# Patient Record
Sex: Female | Born: 1975 | Race: White | Hispanic: No | Smoking: Current every day smoker
Health system: Southern US, Community
[De-identification: ages and names within clinical notes are randomized; demographics above are authoritative.]

## PROBLEM LIST (undated history)

## (undated) DIAGNOSIS — F419 Anxiety disorder, unspecified: Secondary | ICD-10-CM

## (undated) DIAGNOSIS — F32A Depression, unspecified: Secondary | ICD-10-CM

## (undated) DIAGNOSIS — B029 Zoster without complications: Secondary | ICD-10-CM

## (undated) DIAGNOSIS — D649 Anemia, unspecified: Secondary | ICD-10-CM

## (undated) DIAGNOSIS — F329 Major depressive disorder, single episode, unspecified: Secondary | ICD-10-CM

## (undated) DIAGNOSIS — R519 Headache, unspecified: Secondary | ICD-10-CM

## (undated) DIAGNOSIS — K219 Gastro-esophageal reflux disease without esophagitis: Secondary | ICD-10-CM

## (undated) HISTORY — PX: BREAST LUMPECTOMY: SHX2

---
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
## 2007-04-27 ENCOUNTER — Encounter: Payer: Self-pay | Admitting: Obstetrics & Gynecology

## 2007-04-27 ENCOUNTER — Other Ambulatory Visit: Admission: RE | Admit: 2007-04-27 | Discharge: 2007-04-27 | Payer: Self-pay | Admitting: Obstetrics & Gynecology

## 2007-04-27 ENCOUNTER — Ambulatory Visit: Payer: Self-pay | Admitting: Obstetrics & Gynecology

## 2007-05-03 ENCOUNTER — Ambulatory Visit (HOSPITAL_COMMUNITY): Admission: RE | Admit: 2007-05-03 | Discharge: 2007-05-03 | Payer: Self-pay | Admitting: Obstetrics & Gynecology

## 2007-05-11 ENCOUNTER — Ambulatory Visit: Payer: Self-pay | Admitting: Obstetrics & Gynecology

## 2007-05-23 ENCOUNTER — Ambulatory Visit: Payer: Self-pay | Admitting: Oncology

## 2007-08-17 ENCOUNTER — Ambulatory Visit (HOSPITAL_COMMUNITY): Admission: RE | Admit: 2007-08-17 | Discharge: 2007-08-17 | Payer: Self-pay | Admitting: Family Medicine

## 2007-08-25 ENCOUNTER — Ambulatory Visit: Payer: Self-pay | Admitting: Obstetrics & Gynecology

## 2007-08-29 ENCOUNTER — Ambulatory Visit: Payer: Self-pay | Admitting: Oncology

## 2007-08-29 LAB — CBC WITH DIFFERENTIAL/PLATELET
BASO%: 0.6 % (ref 0.0–2.0)
Basophils Absolute: 0 10*3/uL (ref 0.0–0.1)
EOS%: 2.9 % (ref 0.0–7.0)
Eosinophils Absolute: 0.2 10*3/uL (ref 0.0–0.5)
HGB: 12.5 g/dL (ref 11.6–15.9)
MCH: 28.6 pg (ref 26.0–34.0)
MCHC: 33.8 g/dL (ref 32.0–36.0)
MCV: 84.5 fL (ref 81.0–101.0)
MONO#: 0.3 10*3/uL (ref 0.1–0.9)
MONO%: 5.5 % (ref 0.0–13.0)
NEUT#: 2.5 10*3/uL (ref 1.5–6.5)
NEUT%: 47.6 % (ref 39.6–76.8)

## 2007-09-02 LAB — PROTHROMBIN TIME: Prothrombin Time: 14.5 seconds (ref 11.6–15.2)

## 2007-09-02 LAB — APTT: aPTT: 35 seconds (ref 24–37)

## 2007-09-02 LAB — VON WILLEBRAND PANEL: Ristocetin-Cofactor: 78 % (ref 50–150)

## 2007-11-04 ENCOUNTER — Ambulatory Visit: Payer: Self-pay | Admitting: Oncology

## 2010-12-01 ENCOUNTER — Encounter: Payer: Self-pay | Admitting: *Deleted

## 2011-03-24 NOTE — Group Therapy Note (Signed)
NAME:  Donna Munoz, Donna Munoz NO.:  [id number]   MEDICAL RECORD NO.:  [id number]          PATIENT TYPE:  WOC   LOCATION:  [facility]                   FACILITY:  [facility]   PHYSICIAN:  Johnella Moloney, MD        DATE OF BIRTH:  1976-03-28   DATE OF SERVICE:  08/25/2007                                  CLINIC NOTE   Patient is a 35 year old female who presents for results.  She has a  history of chronic pelvic pain and menometrorrhagia.  She had a pelvic  ultrasound which showed hemorrhagic cysts on the left ovary and had a  followup ultrasound on August 17, 2007, which now showed involuting  hemorrhagic cyst.  However, given description of her pain last time, she  was referred to Dr. N__________ at [facility] to rule out  interstitial cystitis.  Patient did see Dr. N__________ and is scheduled  to have a potassium test on November 4 to rule out interstitial  cystitis.  Of note, patient complains of continued menometrorrhagia.  Of  note, she had an endometrial biopsy that was negative but had a bleeding  time slightly elevated at 11.  She will meet with hematology on October  20 for further evaluation of this elevated bleeding time.  Given her  menometrorrhagia, discussed different ways to regulate her periods  including OCPs and IUD, Depo-Provera, or surgical management.  Patient  is a smoker and she smokes about 1 to 2 packs of cigarettes per day so  is not a good candidate for estrogen therapy.  Patient did consent to  using progesterone to see if that could help with her.  In regulating  her periods, she was told that she would need to take this medication at  the same time every day and was given a prescription for Camila 1 tab  p.o. daily.  We will follow up in 1 month to see how her bleeding is  going and in the meantime patient was encouraged to follow through  hematology and urology appointments and will follow up the  recommendations from these doctors.       ______________________________  Johnella Moloney, MD     UD/MEDQ  D:  08/25/2007  T:  08/26/2007  Job:  [id number]

## 2011-03-24 NOTE — Group Therapy Note (Signed)
NAME:  Donna Munoz, Donna Munoz NO.:  [id number]   MEDICAL RECORD NO.:  [id number]          PATIENT TYPE:  WOC   LOCATION:  [facility]                   FACILITY:  [facility]   PHYSICIAN:  Elsie Lincoln, MD      DATE OF BIRTH:  June 03, 1976   DATE OF SERVICE:                                  CLINIC NOTE   The patient is a G2 para 1011 who was sent to me from [facility]  health department for pelvic and multiple ovarian cysts. The patient  also has had periods every two weeks since the birth of her daughter  sixteen months. She was on birth control pills that she felt like made  it worse, so she stopped those. She also had a pelvic ultrasound which  showed a uterus that is 6 1/2 x 3 x 5 with a endometrial stripe of 4.47.  There are tiny cystic areas seen in the endometrium which could be  adenomyosis or blood. The left ovary and the right ovary had multiple  follicular cysts which is normal, however I that the practitioner saw  this patient before me and was confused by this and told her that this  was pathologic. The patient is having this pain that she has severe  dyspareunia. Today, she had pain with a bowel movement, sometimes it  hurts when she urinates, but it is distinctly greater worse on the left  than on the right. She was told in the past that she has had PID, but  she has never taken antibiotics or have been hospitalized for this. She  does not ever admit to having gonorrhea or chlamydia. She did have a C-  section and her doctor told her that her pelvis was beautiful, so I do  not know if scar tissue was likely at that time, however she could have  scar tissue after the C-section. She is also anemic with her last  hemoglobin in the 10s. She was never worked up for this, which we will  do today. Also, she bled heavily after a miscarriage, and she may  actually have von Willebrand's disease, and we will do a bleeding time  for that.   PAST MEDICAL HISTORY:   GERD, chronic pelvic pain.   PAST SURGICAL HISTORY:  C-section and a right breast biopsy in 1995.   OBSTETRICAL HISTORY:  1 C-section, 1 miscarriage, no D and C done.   CONTRACEPTION:  Partner has a vasectomy.   GYN HISTORY:  As above, plus no history of abnormal pap smear, fibroid  tumors, sexually transmitted disease.   SOCIAL HISTORY:  Lives with her husband and daughter. She is a smoker.   SYSTEMIC REVIEW:  Positive for numbness and weakness in the fingers,  swollen in the legs, muscle aches, fevers, night sweats, fatigue, weight  loss, frequent headaches, chest pain, nausea, vomiting, and pain with  intercourse.  We are going to address her GYN complaints today.   FAMILY HISTORY:  Diabetes in grandfather, heart disease in mother and  grandfather, heart attack in mother, cancer in mother, grandfather and  grandmother, blood clot in her  mother. We will elucidate this at her  next visit, not getting into it today.   MEDICATIONS:  Aleve and Tylenol sinus.   ALLERGIES:  Denies.   PHYSICAL EXAMINATION:  VITAL SIGNS:  Temperature 97.5, pulse 103, blood  pressure 105/75, weight 108.9, height 5 foot 2 inches.  GENERAL:  Well developed, well nourished no apparent distress.  ABDOMEN:  Soft and nontender, nondistended, no rebound or guarding.  Endometrial biopsy was done after a negative UCG.  GENITALIA:  Tanner 5. Vagina pink, normal rugae, cervix slightly open,  multiparous, nontender. Uterus anteflexed, mobile, but tender. Bladder  and urethra are nontender. Left adnexa is much more tender than the  right, however the right feels bigger. The left ovary cannot really be  felt as well as the right ovary which is surprising that she says that  this is where her pain is. There could be scar tissue.   PROCEDURE:  After formal consent was obtained, the patient's UCG was  negative. The patient was placed into the dorsal lithotomy position and  a speculum was placed into the vagina. The  cervix was cleaned and the  tenaculum was placed. The uterus sounded to approximately 9 cm.  Two  passes of the endometrial curette was performed with good tissue.   ASSESSMENT AND PLAN:  This is a 35 year old female with chronic pelvic  pain and dysfunctional uterine bleeding.  1. Endometrial biopsy as above.  2. GC and chlamydia wet prep.  3. Pelvic ultrasound.  4. The patient has anemia and we are going to get bleeding time,      pelvic ultrasound, ferritin, B12, RBC, folate, and hemoglobin      electrophoresis.  5. Come back in two weeks for results and plan.           ______________________________  Elsie Lincoln, MD     KL/MEDQ  D:  04/27/2007  T:  04/28/2007  Job:  [id number]

## 2011-08-26 LAB — POCT PREGNANCY, URINE: Operator id: 148111

## 2017-11-09 HISTORY — PX: DENTAL SURGERY: SHX609

## 2017-12-20 ENCOUNTER — Encounter (HOSPITAL_COMMUNITY): Payer: Self-pay | Admitting: Emergency Medicine

## 2017-12-20 ENCOUNTER — Other Ambulatory Visit: Payer: Self-pay

## 2017-12-20 ENCOUNTER — Emergency Department (HOSPITAL_COMMUNITY)
Admission: EM | Admit: 2017-12-20 | Discharge: 2017-12-20 | Disposition: A | Discharge status: Home / Self Care | Payer: Medicaid Other | Attending: Emergency Medicine | Admitting: Emergency Medicine

## 2017-12-20 DIAGNOSIS — R05 Cough: Secondary | ICD-10-CM | POA: Diagnosis present

## 2017-12-20 DIAGNOSIS — R69 Illness, unspecified: Secondary | ICD-10-CM

## 2017-12-20 DIAGNOSIS — F17200 Nicotine dependence, unspecified, uncomplicated: Secondary | ICD-10-CM | POA: Diagnosis not present

## 2017-12-20 DIAGNOSIS — J111 Influenza due to unidentified influenza virus with other respiratory manifestations: Secondary | ICD-10-CM | POA: Insufficient documentation

## 2017-12-20 MED ORDER — IBUPROFEN 600 MG PO TABS: 600.0000 mg | ORAL_TABLET | Freq: Four times a day (QID) | ORAL | 0 refills | Status: DC | PRN

## 2017-12-20 MED ORDER — OSELTAMIVIR PHOSPHATE 75 MG PO CAPS: 75.0000 mg | ORAL_CAPSULE | Freq: Two times a day (BID) | ORAL | 0 refills | Status: DC

## 2017-12-20 NOTE — Discharge Instructions (Signed)
Drink plenty of fluids.  Tylenol every 4 hours for fever.  Follow-up with your primary doctor or return here for any worsening symptoms.

## 2017-12-20 NOTE — ED Triage Notes (Signed)
Pt reports cough, headache, nausea, body aches since yesterday. Pt reports significant other diagnosed and admitted with influenza A. Pt reports last dose of tylenol 12/18/17.

## 2017-12-20 NOTE — ED Provider Notes (Signed)
[facility] EMERGENCY DEPARTMENT Provider Note   CSN: [id number] Arrival date & time: 12/20/17  7672     History   Chief Complaint Chief Complaint  Patient presents with  . Cough    HPI Donna Munoz is a 42 y.o. female.  HPI  Donna Munoz is a 42 y.o. female who presents to the Emergency Department complaining of cough, frontal headache, generalized body aches and fever.  Symptoms present for one day.  Max fever unknown.  Taking tylenol.  Reports that significant other admitted to the hospital one day prior for influenza.  Body aches worse today.  States cough has been non-productive.  She denies chest pain, shortness of breath, abdominal pain, vomiting, neck pain and stiffness.  Did not receive flu vaccine this season  History reviewed. No pertinent past medical history.  There are no active problems to display for this patient.   History reviewed. No pertinent surgical history.  OB History    No data available       Home Medications    Prior to Admission medications   Not on File    Family History History reviewed. No pertinent family history.  Social History Social History   Tobacco Use  . Smoking status: Current Every Day Smoker    Packs/day: 1.00  . Smokeless tobacco: Never Used  Substance Use Topics  . Alcohol use: No    Frequency: Never  . Drug use: Not on file     Allergies   Patient has no allergy information on record.   Review of Systems Review of Systems  Constitutional: Positive for chills and fever. Negative for activity change and appetite change.  HENT: Positive for congestion and sore throat. Negative for facial swelling, rhinorrhea and trouble swallowing.   Eyes: Negative for visual disturbance.  Respiratory: Positive for cough. Negative for chest tightness, shortness of breath, wheezing and stridor.   Gastrointestinal: Negative for abdominal pain, nausea and vomiting.  Genitourinary: Negative for dysuria and flank pain.    Musculoskeletal: Positive for myalgias. Negative for neck pain and neck stiffness.  Skin: Negative for rash.  Neurological: Positive for headaches. Negative for dizziness, weakness and numbness.  Hematological: Negative for adenopathy.  Psychiatric/Behavioral: Negative for confusion.  All other systems reviewed and are negative.    Physical Exam Updated Vital Signs BP 110/70 (BP Location: Left Arm)   Pulse 92   Temp 98.2 F (36.8 C) (Oral)   Resp 18   Ht 5\' 2"  (1.575 m)   Wt 50.8 kg (112 lb)   LMP 12/20/2017   SpO2 98%   BMI 20.49 kg/m   Physical Exam  Constitutional: She is oriented to person, place, and time. She appears well-developed and well-nourished. No distress.  HENT:  Head: Normocephalic and atraumatic.  Right Ear: Tympanic membrane and ear canal normal.  Left Ear: Tympanic membrane and ear canal normal.  Nose: Mucosal edema and rhinorrhea present.  Mouth/Throat: Uvula is midline and mucous membranes are normal. No trismus in the jaw. No uvula swelling. Posterior oropharyngeal erythema present. No oropharyngeal exudate, posterior oropharyngeal edema or tonsillar abscesses.  Eyes: Conjunctivae are normal.  Neck: Normal range of motion and phonation normal. Neck supple. No Kernig's sign noted.  Cardiovascular: Normal rate, regular rhythm and intact distal pulses.  No murmur heard. Pulmonary/Chest: Effort normal and breath sounds normal. No respiratory distress. She has no wheezes. She has no rales.  Abdominal: Soft. She exhibits no distension. There is no tenderness. There is no rebound and no guarding.  Musculoskeletal: She exhibits no edema.  Lymphadenopathy:    She has no cervical adenopathy.  Neurological: She is alert and oriented to person, place, and time. She exhibits normal muscle tone. Coordination normal.  Skin: Skin is warm and dry.  Psychiatric: She has a normal mood and affect.  Nursing note and vitals reviewed.    ED Treatments / Results   Labs (all labs ordered are listed, but only abnormal results are displayed) Labs Reviewed - No data to display  EKG  EKG Interpretation None       Radiology No results found.  Procedures Procedures (including critical care time)  Medications Ordered in ED Medications - No data to display   Initial Impression / Assessment and Plan / ED Course  I have reviewed the triage vital signs and the nursing notes.  Pertinent labs & imaging results that were available during my care of the patient were reviewed by me and considered in my medical decision making (see chart for details).     Pt's spouse admitted yesterday with influenza.  Pt requesting Tamiflu.  Non-toxic appearing on exam.  vitals reassuring. Will continue fluids, tylenol and ibuprofen.     Final Clinical Impressions(s) / ED Diagnoses   Final diagnoses:  Influenza-like illness    ED Discharge Orders    None       Kem Parkinson, PA-C 12/20/17 2130    Francine Graven, DO 12/22/17 [phone]

## 2019-11-22 DIAGNOSIS — M24574 Contracture, right foot: Secondary | ICD-10-CM | POA: Diagnosis not present

## 2019-11-22 DIAGNOSIS — D492 Neoplasm of unspecified behavior of bone, soft tissue, and skin: Secondary | ICD-10-CM | POA: Diagnosis not present

## 2019-11-22 DIAGNOSIS — C4371 Malignant melanoma of right lower limb, including hip: Secondary | ICD-10-CM | POA: Diagnosis not present

## 2019-12-08 ENCOUNTER — Other Ambulatory Visit: Payer: Self-pay

## 2019-12-08 ENCOUNTER — Encounter (HOSPITAL_COMMUNITY): Payer: Self-pay

## 2019-12-11 ENCOUNTER — Inpatient Hospital Stay (HOSPITAL_COMMUNITY): Payer: Medicaid Other | Attending: Hematology | Admitting: Hematology

## 2019-12-11 ENCOUNTER — Encounter (HOSPITAL_COMMUNITY): Payer: Self-pay | Admitting: Hematology

## 2019-12-11 ENCOUNTER — Other Ambulatory Visit: Payer: Self-pay

## 2019-12-11 ENCOUNTER — Inpatient Hospital Stay (HOSPITAL_COMMUNITY): Payer: Medicaid Other

## 2019-12-11 VITALS — BP 93/73 | HR 92 | Temp 97.3°F | Resp 18 | Ht 62.0 in | Wt 110.0 lb

## 2019-12-11 DIAGNOSIS — L600 Ingrowing nail: Secondary | ICD-10-CM | POA: Insufficient documentation

## 2019-12-11 DIAGNOSIS — C439 Malignant melanoma of skin, unspecified: Secondary | ICD-10-CM

## 2019-12-11 DIAGNOSIS — F17210 Nicotine dependence, cigarettes, uncomplicated: Secondary | ICD-10-CM | POA: Insufficient documentation

## 2019-12-11 DIAGNOSIS — M79651 Pain in right thigh: Secondary | ICD-10-CM | POA: Diagnosis not present

## 2019-12-11 DIAGNOSIS — C4371 Malignant melanoma of right lower limb, including hip: Secondary | ICD-10-CM | POA: Diagnosis not present

## 2019-12-11 LAB — CBC WITH DIFFERENTIAL/PLATELET
Abs Immature Granulocytes: 0.02 10*3/uL (ref 0.00–0.07)
Basophils Absolute: 0 10*3/uL (ref 0.0–0.1)
Basophils Relative: 1 %
Eosinophils Absolute: 0.1 10*3/uL (ref 0.0–0.5)
Eosinophils Relative: 2 %
HCT: 35.2 % — ABNORMAL LOW (ref 36.0–46.0)
Hemoglobin: 10 g/dL — ABNORMAL LOW (ref 12.0–15.0)
Immature Granulocytes: 0 %
Lymphocytes Relative: 22 %
Lymphs Abs: 1.6 10*3/uL (ref 0.7–4.0)
MCH: 21.6 pg — ABNORMAL LOW (ref 26.0–34.0)
MCHC: 28.4 g/dL — ABNORMAL LOW (ref 30.0–36.0)
MCV: 76 fL — ABNORMAL LOW (ref 80.0–100.0)
Monocytes Absolute: 0.5 10*3/uL (ref 0.1–1.0)
Monocytes Relative: 7 %
Neutro Abs: 5.1 10*3/uL (ref 1.7–7.7)
Neutrophils Relative %: 68 %
Platelets: 323 10*3/uL (ref 150–400)
RBC: 4.63 MIL/uL (ref 3.87–5.11)
RDW: 18.7 % — ABNORMAL HIGH (ref 11.5–15.5)
WBC: 7.3 10*3/uL (ref 4.0–10.5)
nRBC: 0 % (ref 0.0–0.2)

## 2019-12-11 LAB — COMPREHENSIVE METABOLIC PANEL
ALT: 16 U/L (ref 0–44)
AST: 23 U/L (ref 15–41)
Albumin: 4.2 g/dL (ref 3.5–5.0)
Alkaline Phosphatase: 69 U/L (ref 38–126)
Anion gap: 4 — ABNORMAL LOW (ref 5–15)
BUN: 9 mg/dL (ref 6–20)
CO2: 27 mmol/L (ref 22–32)
Calcium: 9.7 mg/dL (ref 8.9–10.3)
Chloride: 106 mmol/L (ref 98–111)
Creatinine, Ser: 0.64 mg/dL (ref 0.44–1.00)
GFR calc Af Amer: >60 mL/min (ref >60)
GFR calc non Af Amer: >60 mL/min (ref >60)
Glucose, Bld: 92 mg/dL (ref 70–99)
Potassium: 4.5 mmol/L (ref 3.5–5.1)
Sodium: 137 mmol/L (ref 135–145)
Total Bilirubin: 0.5 mg/dL (ref 0.3–1.2)
Total Protein: 7.7 g/dL (ref 6.5–8.1)

## 2019-12-11 LAB — LACTATE DEHYDROGENASE: LDH: 105 U/L (ref 98–192)

## 2019-12-11 NOTE — Assessment & Plan Note (Signed)
1.  Malignant melanoma, acral type: -Patient reports having a freckle-like lesion since childhood on the medial right third toe. -5 years ago it turned black and grew in size in the last 3 years. -[facility] [place] [facility] ingrowing toenail of the right great toe.  She was found to have skin lesion which was also biopsied. -Pathology 11/22/2019 shows malignant melanoma, acral type, Breslow's depth 2 mm, positive ulceration, mitotic index 3, absent microsatellitosis and LVI, perineural invasion absent, tumor infiltrating lymphocytes nonbrisk, tumor regression absent, peripheral margins involved by tumor, deep margins uninvolved by tumor.  Pathological staging is at least pT2b. -She also noticed 3 small moles on the right leg in the last 1 year. -She reported right groin and right medial thigh pain for the past few months. -We have recommended a whole-body PET CT scan to include the lower extremities to evaluate for metastatic disease.  We will also check CBC, CMP and LDH today. -If there is no metastatic disease, we will make a referral for surgical resection.  2.  Family history: -Mother died of throat cancer.  Maternal grand mother had breast cancer.  Maternal aunt had pancreatic cancer. -Maternal grandfather had "bone cancer".  Maternal great grandmother died of melanoma. -Given the extensive family history, I think she will benefit from genetic testing.

## 2019-12-11 NOTE — Patient Instructions (Signed)
[facility] Discharge Instructions  You were seen today by Dr. Delton Coombes. He went over your history, family history and how you've been feeling lately. He will schedule you for a whole body PET scan for further evaluation. He will have blood work drawn today. He will see you back after your scan for follow up.   Thank you for choosing [facility] to provide your oncology and hematology care.  To afford each patient quality time with our provider, please arrive at least 15 minutes before your scheduled appointment time.   If you have a lab appointment with the [facility] please come in thru the  Main Entrance and check in at the main information desk  You need to re-schedule your appointment should you arrive 10 or more minutes late.  We strive to give you quality time with our providers, and arriving late affects you and other patients whose appointments are after yours.  Also, if you no show three or more times for appointments you may be dismissed from the clinic at the providers discretion.     Again, thank you for choosing [facility].  Our hope is that these requests will decrease the amount of time that you wait before being seen by our physicians.       _____________________________________________________________  Should you have questions after your visit to [facility], please contact our office at [phone] between the hours of 8:00 a.m. and 4:30 p.m.  Voicemails left after 4:00 p.m. will not be returned until the following business day.  For prescription refill requests, have your pharmacy contact our office and allow 72 hours.    Cancer Center Support Programs:   > Cancer Support Group  2nd Tuesday of the month 1pm-2pm, Journey Room

## 2019-12-11 NOTE — Progress Notes (Signed)
[facility] CONSULT NOTE  Patient Care Team: Patient, No Pcp Per as PCP - General (General Practice)  CHIEF COMPLAINTS/PURPOSE OF CONSULTATION:  Malignant melanoma.  HISTORY OF PRESENTING ILLNESS:  Donna Munoz 44 y.o. female is seen in consultation today at the request of [facility] for malignant melanoma involving the skin of right third toe.  Patient had developed ingrowing toenail in the right great toe and was seen at [facility] [place] in [place].  She was noted to have an abnormal skin lesion in the medial part of the right third toe.  This was biopsied and was consistent with malignant melanoma, acral type.  Patient reported having a freckle since she was a child in the same area.  About 5 years ago, the area got dark pigmentation.  She also noticed growth in the size of the mole in the last 3 years.  She also reported right groin and right medial thigh pain for the last 1 year.  Denies any fevers, night sweats or weight loss.  She reports that she has been out of work for the past 1 year.  She was working at [employer] prior to that.  She is a current active smoker, smokes 1-1 and half packs per day for 25 years.  Otherwise denies any major medical problems.  Family history significant for maternal great grandmother who died of melanoma.  Maternal grandfather had "bone cancer".  Mother died of throat cancer metastatic.  Maternal grandmother had breast cancer and maternal aunt had pancreatic cancer.  MEDICAL HISTORY:  History reviewed. No pertinent past medical history.  SURGICAL HISTORY: Past Surgical History:  Procedure Laterality Date  . BREAST LUMPECTOMY  age 3  . CESAREAN SECTION  01/06/2006  . CESAREAN SECTION  10/15/2016  . DENTAL SURGERY  2019    SOCIAL HISTORY: Social History   Socioeconomic History  . Marital status: Legally Separated    Spouse name: Not on file  . Number of children: 2  . Years of education: Not on  file  . Highest education level: Not on file  Occupational History  . Occupation: unemployed d/t COVID  Tobacco Use  . Smoking status: Current Every Day Smoker    Packs/day: 1.00  . Smokeless tobacco: Never Used  Substance and Sexual Activity  . Alcohol use: No  . Drug use: Yes    Types: Marijuana  . Sexual activity: Yes  Other Topics Concern  . Not on file  Social History Narrative  . Not on file   Social Determinants of Health   Financial Resource Strain: High Risk  . Difficulty of Paying Living Expenses: Hard  Food Insecurity: No Food Insecurity  . Worried About Charity fundraiser in the Last Year: Never true  . Ran Out of Food in the Last Year: Never true  Transportation Needs: No Transportation Needs  . Lack of Transportation (Medical): No  . Lack of Transportation (Non-Medical): No  Physical Activity: Inactive  . Days of Exercise per Week: 0 days  . Minutes of Exercise per Session: 0 min  Stress: Stress Concern Present  . Feeling of Stress : Very much  Social Connections: Moderately Isolated  . Frequency of Communication with Friends and Family: Twice a week  . Frequency of Social Gatherings with Friends and Family: Never  . Attends Religious Services: 1 to 4 times per year  . Active Member of Clubs or Organizations: No  . Attends Archivist Meetings: Never  .  Marital Status: Separated  Intimate Partner Violence: Not At Risk  . Fear of Current or Ex-Partner: No  . Emotionally Abused: No  . Physically Abused: No  . Sexually Abused: No    FAMILY HISTORY: Family History  Problem Relation Age of Onset  . Heart disease Mother   . Cancer Mother   . Ovarian cysts Sister   . Healthy Brother   . Cancer Maternal Grandmother   . Heart attack Maternal Grandmother   . Cancer Maternal Grandfather   . Cancer Paternal Grandmother   . Healthy Sister   . Healthy Sister   . Psoriasis Daughter   . Healthy Son     ALLERGIES:  has no allergies on  file.  MEDICATIONS:  Current Outpatient Medications  Medication Sig Dispense Refill  . ibuprofen (ADVIL,MOTRIN) 600 MG tablet Take 1 tablet (600 mg total) by mouth every 6 (six) hours as needed. Take with food (Patient not taking: Reported on 12/08/2019) 21 tablet 0   No current facility-administered medications for this visit.    REVIEW OF SYSTEMS:   Constitutional: Denies fevers, chills or abnormal night sweats Eyes: Denies blurriness of vision, double vision or watery eyes Ears, nose, mouth, throat, and face: Denies mucositis or sore throat Respiratory: Denies cough, dyspnea or wheezes Cardiovascular: Denies palpitation, chest discomfort or lower extremity swelling Gastrointestinal:  Denies nausea, heartburn or change in bowel habits Skin: Denies abnormal skin rashes Lymphatics: Denies new lymphadenopathy or easy bruising Neurological:Denies numbness, tingling or new weaknesses Behavioral/Psych: Mood is stable, no new changes.  Positive for sleep problems. All other systems were reviewed with the patient and are negative.  PHYSICAL EXAMINATION: ECOG PERFORMANCE STATUS: 0 - Asymptomatic  Vitals:   12/11/19 1249  BP: 93/73  Pulse: 92  Resp: 18  Temp: (!) 97.3 F (36.3 C)  SpO2: 100%   Filed Weights   12/11/19 1249  Weight: 110 lb (49.9 kg)    GENERAL:alert, no distress and comfortable SKIN: skin color, texture, turgor are normal, no rashes or significant lesions EYES: normal, conjunctiva are pink and non-injected, sclera clear OROPHARYNX:no exudate, no erythema and lips, buccal mucosa, and tongue normal  NECK: supple, thyroid normal size, non-tender, without nodularity LYMPH:  no palpable lymphadenopathy in the cervical, axillary or inguinal LUNGS: clear to auscultation and percussion with normal breathing effort HEART: regular rate & rhythm and no murmurs and no lower extremity edema ABDOMEN:abdomen soft, non-tender and normal bowel sounds Musculoskeletal:no cyanosis  of digits and no clubbing  PSYCH: alert & oriented x 3 with fluent speech NEURO: no focal motor/sensory deficits There is a scab at the biopsy site in the medial part of the right third toe. There are at least 3 hyperpigmented lesions in the right leg, one of them is raised.         LABORATORY DATA:  I have reviewed the data as listed Lab Results  Component Value Date   WBC 5.2 08/29/2007   HGB 12.5 08/29/2007   HCT 37.0 08/29/2007   MCV 84.5 08/29/2007   PLT 277 08/29/2007     Chemistry   No results found for: NA, K, CL, CO2, BUN, CREATININE, GLU No results found for: CALCIUM, ALKPHOS, AST, ALT, BILITOT     RADIOGRAPHIC STUDIES: I have personally reviewed the radiological images as listed and agreed with the findings in the report.  ASSESSMENT & PLAN:  Melanoma of skin ([place]) 1.  Malignant melanoma, acral type: -Patient reports having a freckle-like lesion since childhood on the medial right  third toe. -5 years ago it turned black and grew in size in the last 3 years. -[facility] went to [facility] [place] for ingrowing toenail of the right great toe.  She was found to have skin lesion which was also biopsied. -Pathology 11/22/2019 shows malignant melanoma, acral type, Breslow's depth 2 mm, positive ulceration, mitotic index 3, absent microsatellitosis and LVI, perineural invasion absent, tumor infiltrating lymphocytes nonbrisk, tumor regression absent, peripheral margins involved by tumor, deep margins uninvolved by tumor.  Pathological staging is at least pT2b. -She also noticed 3 small moles on the right leg in the last 1 year. -She reported right groin and right medial thigh pain for the past few months. -We have recommended a whole-body PET CT scan to include the lower extremities to evaluate for metastatic disease.  We will also check CBC, CMP and LDH today. -If there is no metastatic disease, we will make a referral for surgical resection.  2.  Family  history: -Mother died of throat cancer.  Maternal grand mother had breast cancer.  Maternal aunt had pancreatic cancer. -Maternal grandfather had "bone cancer".  Maternal great grandmother died of melanoma. -Given the extensive family history, I think she will benefit from genetic testing.  Orders Placed This Encounter  Procedures  . NM PET Image Initial (PI) Whole Body    Please be sure to get both legs and both feet    Standing Status:   Future    Standing Expiration Date:   12/10/2020    Order Specific Question:   ** REASON FOR EXAM (FREE TEXT)    Answer:   melanoma of right third toe    Order Specific Question:   If indicated for the ordered procedure, I authorize the administration of a radiopharmaceutical per Radiology protocol    Answer:   Yes    Order Specific Question:   Is the patient pregnant?    Answer:   No    Order Specific Question:   Preferred imaging location?    Answer:   [facility]    Order Specific Question:   Radiology Contrast Protocol - do NOT remove file path    Answer:   [email or web address]  . CBC with Differential/Platelet    Standing Status:   Future    Standing Expiration Date:   12/10/2020  . Comprehensive metabolic panel    Standing Status:   Future    Standing Expiration Date:   12/10/2020  . Lactate dehydrogenase    Standing Status:   Future    Standing Expiration Date:   12/10/2020    All questions were answered. The patient knows to call the clinic with any problems, questions or concerns.      Derek Jack, MD 12/11/2019 1:40 PM

## 2019-12-20 ENCOUNTER — Encounter (HOSPITAL_COMMUNITY)
Admission: RE | Admit: 2019-12-20 | Discharge: 2019-12-20 | Disposition: A | Discharge status: Home / Self Care | Payer: Medicaid Other | Source: Ambulatory Visit | Attending: Hematology | Admitting: Hematology

## 2019-12-20 ENCOUNTER — Other Ambulatory Visit: Payer: Self-pay

## 2019-12-20 DIAGNOSIS — C4371 Malignant melanoma of right lower limb, including hip: Secondary | ICD-10-CM | POA: Diagnosis not present

## 2019-12-20 LAB — GLUCOSE, CAPILLARY: Glucose-Capillary: 85 mg/dL (ref 70–99)

## 2019-12-21 ENCOUNTER — Inpatient Hospital Stay (HOSPITAL_BASED_OUTPATIENT_CLINIC_OR_DEPARTMENT_OTHER): Payer: Medicaid Other | Admitting: Hematology

## 2019-12-21 VITALS — BP 101/69 | HR 80 | Temp 97.1°F | Resp 20 | Wt 115.2 lb

## 2019-12-21 DIAGNOSIS — E041 Nontoxic single thyroid nodule: Secondary | ICD-10-CM | POA: Diagnosis not present

## 2019-12-21 DIAGNOSIS — C4371 Malignant melanoma of right lower limb, including hip: Secondary | ICD-10-CM | POA: Diagnosis not present

## 2019-12-21 DIAGNOSIS — C439 Malignant melanoma of skin, unspecified: Secondary | ICD-10-CM | POA: Diagnosis not present

## 2019-12-21 NOTE — Progress Notes (Signed)
Donna Munoz, Donna Munoz   CLINIC:  Medical Oncology/Hematology  PCP:  Patient, No Pcp Per No address on file None   REASON FOR VISIT:  Follow-up for malignant melanoma.  CURRENT THERAPY: Under work-up.  BRIEF ONCOLOGIC HISTORY:  Oncology History   No history exists.     CANCER STAGING: Cancer Staging Melanoma of skin ([place]) Staging form: Melanoma of the Skin, AJCC 8th Edition - Clinical: Stage IIA (cT2b, cN0, cM0) - Unsigned    INTERVAL HISTORY:  [name] 44 y.o. female seen for follow-up of Donna Munoz.  She just underwent a PET CT scan.  Reported some shortness of breath and cough at nighttime.  Cough is dry.  Reports some anxiety and sleep problems because of the new diagnosis.  Appetite is 25%.  Energy levels are 50%.  No new onset pains.    REVIEW OF SYSTEMS:  Review of Systems  Respiratory: Positive for cough and shortness of breath.   Psychiatric/Behavioral: Positive for sleep disturbance. The patient is nervous/anxious.   All other systems reviewed and are negative.    PAST MEDICAL/SURGICAL HISTORY:  No past medical history on file. Past Surgical History:  Procedure Laterality Date  . BREAST LUMPECTOMY  age 86  . CESAREAN SECTION  01/06/2006  . CESAREAN SECTION  10/15/2016  . DENTAL SURGERY  2019     SOCIAL HISTORY:  Social History   Socioeconomic History  . Marital status: Legally Separated    Spouse name: Not on file  . Number of children: 2  . Years of education: Not on file  . Highest education level: Not on file  Occupational History  . Occupation: unemployed d/t COVID  Tobacco Use  . Smoking status: Current Every Day Smoker    Packs/day: 1.00  . Smokeless tobacco: Never Used  Substance and Sexual Activity  . Alcohol use: No  . Drug use: Yes    Types: Marijuana  . Sexual activity: Yes  Other Topics Concern  . Not on file  Social History Narrative  . Not on file   Social  Determinants of Health   Financial Resource Strain: High Risk  . Difficulty of Paying Living Expenses: Hard  Food Insecurity: No Food Insecurity  . Worried About Charity fundraiser in the Last Year: Never true  . Ran Out of Food in the Last Year: Never true  Transportation Needs: No Transportation Needs  . Lack of Transportation (Medical): No  . Lack of Transportation (Non-Medical): No  Physical Activity: Inactive  . Days of Exercise per Week: 0 days  . Minutes of Exercise per Session: 0 min  Stress: Stress Concern Present  . Feeling of Stress : Very much  Social Connections: Moderately Isolated  . Frequency of Communication with Friends and Family: Twice a week  . Frequency of Social Gatherings with Friends and Family: Never  . Attends Religious Services: 1 to 4 times per year  . Active Member of Clubs or Organizations: No  . Attends Archivist Meetings: Never  . Marital Status: Separated  Intimate Partner Violence: Not At Risk  . Fear of Current or Ex-Partner: No  . Emotionally Abused: No  . Physically Abused: No  . Sexually Abused: No    FAMILY HISTORY:  Family History  Problem Relation Age of Onset  . Heart disease Mother   . Cancer Mother   . Ovarian cysts Sister   . Healthy Brother   . Cancer Maternal Grandmother   .  Heart attack Maternal Grandmother   . Cancer Maternal Grandfather   . Cancer Paternal Grandmother   . Healthy Sister   . Healthy Sister   . Psoriasis Daughter   . Healthy Son     CURRENT MEDICATIONS:  Outpatient Encounter Medications as of 12/21/2019  Medication Sig  . ibuprofen (ADVIL,MOTRIN) 600 MG tablet Take 1 tablet (600 mg total) by mouth every 6 (six) hours as needed. Take with food (Patient not taking: Reported on 12/08/2019)   No facility-administered encounter medications on file as of 12/21/2019.    ALLERGIES:  No Known Allergies   PHYSICAL EXAM:  ECOG Performance status: 0  Vitals:   12/21/19 1111  BP: 101/69   Pulse: 80  Resp: 20  Temp: (!) 97.1 F (36.2 C)  SpO2: 100%   Filed Weights   12/21/19 1111  Weight: 115 lb 3.2 oz (52.3 kg)    Physical Exam Vitals reviewed.  Constitutional:      Appearance: Normal appearance.  Cardiovascular:     Rate and Rhythm: Normal rate and regular rhythm.     Heart sounds: Normal heart sounds.  Pulmonary:     Effort: Pulmonary effort is normal.     Breath sounds: Normal breath sounds.  Skin:    General: Skin is warm.  Neurological:     General: No focal deficit present.     Mental Status: She is alert and oriented to person, place, and time.  Psychiatric:        Mood and Affect: Mood normal.        Behavior: Behavior normal.      LABORATORY DATA:  I have reviewed the labs as listed.  CBC    Component Value Date/Time   WBC 7.3 12/11/2019 1351   RBC 4.63 12/11/2019 1351   HGB 10.0 (L) 12/11/2019 1351   HGB 12.5 08/29/2007 1545   HCT 35.2 (L) 12/11/2019 1351   HCT 37.0 08/29/2007 1545   PLT 323 12/11/2019 1351   PLT 277 08/29/2007 1545   MCV 76.0 (L) 12/11/2019 1351   MCV 84.5 08/29/2007 1545   MCH 21.6 (L) 12/11/2019 1351   MCHC 28.4 (L) 12/11/2019 1351   RDW 18.7 (H) 12/11/2019 1351   RDW 16.2 (H) 08/29/2007 1545   LYMPHSABS 1.6 12/11/2019 1351   LYMPHSABS 2.3 08/29/2007 1545   MONOABS 0.5 12/11/2019 1351   MONOABS 0.3 08/29/2007 1545   EOSABS 0.1 12/11/2019 1351   EOSABS 0.2 08/29/2007 1545   BASOSABS 0.0 12/11/2019 1351   BASOSABS 0.0 08/29/2007 1545   CMP Latest Ref Rng & Units 12/11/2019  Glucose 70 - 99 mg/dL 92  BUN 6 - 20 mg/dL 9  Creatinine 0.44 - 1.00 mg/dL 0.64  Sodium 135 - 145 mmol/L 137  Potassium 3.5 - 5.1 mmol/L 4.5  Chloride 98 - 111 mmol/L 106  CO2 22 - 32 mmol/L 27  Calcium 8.9 - 10.3 mg/dL 9.7  Total Protein 6.5 - 8.1 g/dL 7.7  Total Bilirubin 0.3 - 1.2 mg/dL 0.5  Alkaline Phos 38 - 126 U/L 69  AST 15 - 41 U/L 23  ALT 0 - 44 U/L 16       DIAGNOSTIC IMAGING:  I have independently reviewed the  scans and discussed with the patient.     ASSESSMENT & PLAN:   Melanoma of skin ([place]) 1.  Malignant melanoma, acral type: -Patient reports having a freckle-like lesion since childhood on the medial right third toe. -5 years ago it turned black and grew in  size in the last 3 years. -[facility] went to [facility] [place] for ingrowing toenail of the right great toe.  She was found to have skin lesion which was also biopsied. -Pathology 11/22/2019 shows malignant melanoma, acral type, Breslow's depth 2 mm, positive ulceration, mitotic index 3, absent microsatellitosis and LVI, perineural invasion absent, tumor infiltrating lymphocytes nonbrisk, tumor regression absent, peripheral margins involved by tumor, deep margins uninvolved by tumor.  Pathological staging is at least pT2b. -She also noticed 3 small moles on the right leg in the last 1 year.  We have reviewed her LDH which was within normal limits. -We reviewed the PET CT scan from 12/20/2019.  No clear evidence of metastatic disease.  There is uptake in the right thyroid. -At this time I have recommended wide excision of the melanoma with sentinel lymph node biopsy.  I will make a referral to Dr. Barry Dienes. -I will see her back 1 month after surgery.  2.  Family history: -Mother died of throat cancer.  Maternal grand mother had breast cancer.  Maternal aunt had pancreatic cancer. -Maternal grandfather had "bone cancer".  Maternal great grandmother died of melanoma. -Given extensive family history, we will consider genetic testing.  3.  Right thyroid nodule: -PET scan on 12/20/2019 showed uptake in the right thyroid. -We will order ultrasound and possibly biopsy.  4.  Microcytic anemia: -Labs from 12/11/2019 shows hemoglobin 10 with MCV of 76. -She is actively menstruating.  I have recommended her to start taking iron pill daily along with stool softener. -We will plan to repeat labs at next visit.      Orders placed this  encounter:  Orders Placed This Encounter  Procedures  . US Soft Tissue Head/Neck      Roman Dubuc, MD [place] [phone]

## 2019-12-21 NOTE — Patient Instructions (Addendum)
[place] at [facility] Discharge Instructions  You were seen today by Dr. Delton Coombes. He went over your recent test results. Your test results shoe\w that you have a stage 2 Melanoma, this can be resectable and able to be removed by surgery. He will contact a Psychologist, sport and exercise and discuss your case with them. He will follow up with you by phone once he has discussed things with the surgeon.   Thank you for choosing [place] at [facility] to provide your oncology and hematology care.  To afford each patient quality time with our provider, please arrive at least 15 minutes before your scheduled appointment time.   If you have a lab appointment with the [facility] please come in thru the  Main Entrance and check in at the main information desk  You need to re-schedule your appointment should you arrive 10 or more minutes late.  We strive to give you quality time with our providers, and arriving late affects you and other patients whose appointments are after yours.  Also, if you no show three or more times for appointments you may be dismissed from the clinic at the providers discretion.     Again, thank you for choosing [facility].  Our hope is that these requests will decrease the amount of time that you wait before being seen by our physicians.       _____________________________________________________________  Should you have questions after your visit to [facility], please contact our office at [phone] between the hours of 8:00 a.m. and 4:30 p.m.  Voicemails left after 4:00 p.m. will not be returned until the following business day.  For prescription refill requests, have your pharmacy contact our office and allow 72 hours.    Cancer Center Support Programs:   > Cancer Support Group  2nd Tuesday of the month 1pm-2pm, Journey Room

## 2019-12-22 NOTE — Assessment & Plan Note (Addendum)
1.  Malignant melanoma, acral type: -Patient reports having a freckle-like lesion since childhood on the medial right third toe. -5 years ago it turned black and grew in size in the last 3 years. -[facility] went to [facility] [place] for ingrowing toenail of the right great toe.  She was found to have skin lesion which was also biopsied. -Pathology 11/22/2019 shows malignant melanoma, acral type, Breslow's depth 2 mm, positive ulceration, mitotic index 3, absent microsatellitosis and LVI, perineural invasion absent, tumor infiltrating lymphocytes nonbrisk, tumor regression absent, peripheral margins involved by tumor, deep margins uninvolved by tumor.  Pathological staging is at least pT2b. -She also noticed 3 small moles on the right leg in the last 1 year.  We have reviewed her LDH which was within normal limits. -We reviewed the PET CT scan from 12/20/2019.  No clear evidence of metastatic disease.  There is uptake in the right thyroid. -At this time I have recommended wide excision of the melanoma with sentinel lymph node biopsy.  I will make a referral to Dr. Barry Dienes. -I will see her back 1 month after surgery.  2.  Family history: -Mother died of throat cancer.  Maternal grand mother had breast cancer.  Maternal aunt had pancreatic cancer. -Maternal grandfather had "bone cancer".  Maternal great grandmother died of melanoma. -Given extensive family history, we will consider genetic testing.  3.  Right thyroid nodule: -PET scan on 12/20/2019 showed uptake in the right thyroid. -We will order ultrasound and possibly biopsy.  4.  Microcytic anemia: -Labs from 12/11/2019 shows hemoglobin 10 with MCV of 76. -She is actively menstruating.  I have recommended her to start taking iron pill daily along with stool softener. -We will plan to repeat labs at next visit.

## 2020-01-16 ENCOUNTER — Other Ambulatory Visit: Payer: Self-pay | Admitting: General Surgery

## 2020-01-16 DIAGNOSIS — K4090 Unilateral inguinal hernia, without obstruction or gangrene, not specified as recurrent: Secondary | ICD-10-CM | POA: Diagnosis not present

## 2020-01-16 DIAGNOSIS — C4371 Malignant melanoma of right lower limb, including hip: Secondary | ICD-10-CM | POA: Diagnosis not present

## 2020-01-17 ENCOUNTER — Telehealth (HOSPITAL_COMMUNITY): Payer: Self-pay | Admitting: *Deleted

## 2020-01-20 ENCOUNTER — Other Ambulatory Visit: Payer: Self-pay | Admitting: General Surgery

## 2020-01-20 DIAGNOSIS — C4371 Malignant melanoma of right lower limb, including hip: Secondary | ICD-10-CM

## 2020-01-22 ENCOUNTER — Other Ambulatory Visit (HOSPITAL_COMMUNITY): Payer: Self-pay | Admitting: *Deleted

## 2020-01-22 ENCOUNTER — Encounter (HOSPITAL_BASED_OUTPATIENT_CLINIC_OR_DEPARTMENT_OTHER): Payer: Self-pay | Admitting: General Surgery

## 2020-01-22 ENCOUNTER — Other Ambulatory Visit: Payer: Self-pay

## 2020-01-22 DIAGNOSIS — E041 Nontoxic single thyroid nodule: Secondary | ICD-10-CM

## 2020-01-23 ENCOUNTER — Other Ambulatory Visit (HOSPITAL_COMMUNITY): Payer: Self-pay | Admitting: *Deleted

## 2020-01-23 DIAGNOSIS — E041 Nontoxic single thyroid nodule: Secondary | ICD-10-CM

## 2020-01-26 ENCOUNTER — Other Ambulatory Visit (HOSPITAL_COMMUNITY)
Admission: RE | Admit: 2020-01-26 | Discharge: 2020-01-26 | Disposition: A | Discharge status: Home / Self Care | Payer: Medicaid Other | Source: Ambulatory Visit | Attending: General Surgery | Admitting: General Surgery

## 2020-01-26 ENCOUNTER — Other Ambulatory Visit: Payer: Self-pay

## 2020-01-26 DIAGNOSIS — Z01812 Encounter for preprocedural laboratory examination: Secondary | ICD-10-CM | POA: Insufficient documentation

## 2020-01-26 DIAGNOSIS — Z20822 Contact with and (suspected) exposure to covid-19: Secondary | ICD-10-CM | POA: Insufficient documentation

## 2020-01-27 LAB — SARS CORONAVIRUS 2 (TAT 6-24 HRS): SARS Coronavirus 2: NEGATIVE

## 2020-01-29 NOTE — H&P (Signed)
Donna Munoz Documented: 01/16/2020 3:45 PM Location: [facility] Patient #: [id number] DOB: [birth year] Separated / Language: Cleophus Molt / Race: White Female   History of Present Illness [name] MD; 01/22/2020 10:19 AM) The patient is a 44 year old female who presents with malignant melanoma. Pt is a 44 yo F referred for consultation by Dr. 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 The patient was seen by Dr. Barkley Bruns (podiatrist at [facility]) for a foot lesions. This was biopsied and seen to be at least a 2 mm melanoma. The patient was actually being seen for an ingrown toenail, but the skin lesion was seen and addressed as well. The patient had a freckle type skin spot there for many years, but did note that it had been getting darker and getting a bit larger. Her maternal grandmother had melanoma and breast cancer. Her maternal aunt had pancreatic cancer. Mother had "throat cancer." Maternal grandfather had bone cancer.   She has no other personal history of cancer  Of note, she has been having right groin pain as well. She has been feeling a bulge, especially when she lifts.   pathology [facility] accession number [id number].  acral melanoma, at least 2 mm positive deep and peripheral margins. + ulceration 3 mit/mm2 no microsatellitosis, no perineural invasion, no LVI, non brisk TIL   Past Surgical History Emeline Gins, CMA; 01/16/2020 3:45 PM) Breast Biopsy  Left. Cesarean Section - Multiple   Diagnostic Studies History Emeline Gins, [place]; 01/16/2020 3:45 PM) Colonoscopy  never Mammogram  >3 years ago Pap Smear  1-5 years ago  Allergies Emeline Gins, CMA; 01/16/2020 3:45 PM) No Known Drug Allergies [01/16/2020]: Allergies Reconciled   Medication History Emeline Gins, [place]; 01/16/2020 3:45 PM) No Current Medications Medications Reconciled  Social History Emeline Gins, [place]; 01/16/2020 3:45 PM) Caffeine  use  Carbonated beverages, Tea. No alcohol use  No drug use  Tobacco use  Current every day smoker.  Family History Emeline Gins, [place]; 01/16/2020 3:45 PM) Arthritis  Father, Mother. Breast Cancer  Mother. Cancer  Mother. Cervical Cancer  Family Members In General. Depression  Brother, Father, Mother, Sister. Heart Disease  Family Members In General. Heart disease in female family member before age 88   Pregnancy / Birth History Emeline Gins, [place]; 01/16/2020 3:45 PM) Age at menarche  79 years. Gravida  3 Maternal age  53-30 Para  2 Regular periods   Other Problems Emeline Gins, [place]; 01/16/2020 3:45 PM) Anxiety Disorder  Bladder Problems  Depression  Lump In Breast     Review of Systems Emeline Gins CMA; 01/16/2020 3:45 PM) General Present- Appetite Loss, Fatigue and Night Sweats. Not Present- Chills, Fever, Weight Gain and Weight Loss. Skin Present- Change in Wart/Mole and Dryness. Not Present- Hives, Jaundice, New Lesions, Non-Healing Wounds, Rash and Ulcer. HEENT Present- Wears glasses/contact lenses. Not Present- Earache, Hearing Loss, Hoarseness, Nose Bleed, Oral Ulcers, Ringing in the Ears, Seasonal Allergies, Sinus Pain, Sore Throat, Visual Disturbances and Yellow Eyes. Respiratory Not Present- Bloody sputum, Chronic Cough, Difficulty Breathing, Snoring and Wheezing. Breast Not Present- Breast Mass, Breast Pain, Nipple Discharge and Skin Changes. Cardiovascular Present- Chest Pain, Difficulty Breathing Lying Down, Leg Cramps and Shortness of Breath. Not Present- Palpitations, Rapid Heart Rate and Swelling of Extremities. Gastrointestinal Present- Abdominal Pain, Difficulty Swallowing, Nausea and Vomiting. Not Present- Bloating, Bloody Stool, Change in Bowel Habits, Chronic diarrhea, Constipation, Excessive gas, Gets full quickly at meals, Hemorrhoids, Indigestion and Rectal Pain. Female  Genitourinary Present- Frequency, Pelvic Pain and Urgency.  Not Present- Nocturia and Painful Urination. Musculoskeletal Present- Back Pain and Joint Pain. Not Present- Joint Stiffness, Muscle Pain, Muscle Weakness and Swelling of Extremities. Neurological Present- Headaches and Numbness. Not Present- Decreased Memory, Fainting, Seizures, Tingling, Tremor, Trouble walking and Weakness. Psychiatric Not Present- Anxiety, Bipolar, Change in Sleep Pattern, Depression, Fearful and Frequent crying. Endocrine Not Present- Cold Intolerance, Excessive Hunger, Hair Changes, Heat Intolerance, Hot flashes and New Diabetes. Hematology Present- Easy Bruising. Not Present- Blood Thinners, Excessive bleeding, Gland problems, HIV and Persistent Infections.  Vitals Emeline Gins CMA; 01/16/2020 3:45 PM) 01/16/2020 3:45 PM Weight: 110 lb Height: 62in Body Surface Area: 1.48 m Body Mass Index: 20.12 kg/m  Temp.: 98.51F  Pulse: 94 (Regular)  BP: 98/64 (Sitting, Left Arm, Standard)       Physical Exam [name] MD; 01/22/2020 10:20 AM) General Mental Status-Alert. General Appearance-Consistent with stated age. Hydration-Well hydrated. Voice-Normal.  Integumentary Note: scar on medial third toe.   Head and Neck Head-normocephalic, atraumatic with no lesions or palpable masses. Trachea-midline. Thyroid Gland Characteristics - normal size and consistency.  Eye Eyeball - Bilateral-Extraocular movements intact. Sclera/Conjunctiva - Bilateral-No scleral icterus.  Chest and Lung Exam Chest and lung exam reveals -quiet, even and easy respiratory effort with no use of accessory muscles and on auscultation, normal breath sounds, no adventitious sounds and normal vocal resonance. Inspection Chest Wall - Normal. Back - normal.  Cardiovascular Cardiovascular examination reveals -normal heart sounds, regular rate and rhythm with no murmurs and normal pedal pulses bilaterally.  Abdomen Inspection Inspection of the abdomen  reveals - No Hernias. Palpation/Percussion Palpation and Percussion of the abdomen reveal - Soft, Non Tender, No Rebound tenderness, No Rigidity (guarding) and No hepatosplenomegaly. Auscultation Auscultation of the abdomen reveals - Bowel sounds normal.  Female Genitourinary Note: + bulge wtih valsalva right groin c/w indirect inguinal hernia   Neurologic Neurologic evaluation reveals -alert and oriented x 3 with no impairment of recent or remote memory. Mental Status-Normal.  Musculoskeletal Global Assessment -Note: no gross deformities.  Normal Exam - Left-Upper Extremity Strength Normal and Lower Extremity Strength Normal. Normal Exam - Right-Upper Extremity Strength Normal and Lower Extremity Strength Normal.  Lymphatic Head & Neck  General Head & Neck Lymphatics: Bilateral - Description - Normal. Axillary  General Axillary Region: Bilateral - Description - Normal. Tenderness - Non Tender. Femoral & Inguinal  Generalized Femoral & Inguinal Lymphatics: Bilateral - Description - No Generalized lymphadenopathy.    Assessment & Plan [name] MD; 01/22/2020 10:25 AM) MALIGNANT MELANOMA OF SKIN OF TOE, RIGHT (C43.71) Impression: I will try WLE, but I advised patient that i will likely not be able to pull skin together and she will most likely have a bit of open wound. I also discussed that if margins are positive, we will have to go back to amputate that toe.  Will plan dual tracer SLN bx. I discussed risks including bleeding, infection, groin seroma, recurrent cancer, need for drain, possible numbness and more.  I advised that if SLn bx is positive, she will need immune therapy with Dr. Delton Coombes. Current Plans Pt Education - Melanoma: skin cancer REDUCIBLE RIGHT INGUINAL HERNIA (K40.90) Impression: This hernia is directly in the surgical field. I would plan to do the hernia first to minimize risk of transmission of cancer to hernia field.  I discussed  risks of hernia repair including chronic pain/numbness, possible recurrent hernia, mesh infection or complication, swelling/seroma, anesthesia issues and more. She was given a  hernia book. Current Plans You are being scheduled for surgery- Our schedulers will call you.  You should hear from our office's scheduling department within 5 working days about the location, date, and time of surgery. We try to make accommodations for patient's preferences in scheduling surgery, but sometimes the OR schedule or the surgeon's schedule prevents [place] from making those accommodations.  If you have not heard from our office [phone]) in 5 working days, call the office and ask for your surgeon's nurse.  If you have other questions about your diagnosis, plan, or surgery, call the office and ask for your surgeon's nurse.  Pt Education - Pamphlet Given - Hernia Surgery: discussed with patient and provided information.   Signed electronically by [name], MD (01/22/2020 10:25 AM)

## 2020-01-29 NOTE — Progress Notes (Signed)

## 2020-01-30 ENCOUNTER — Encounter (HOSPITAL_BASED_OUTPATIENT_CLINIC_OR_DEPARTMENT_OTHER): Payer: Self-pay | Admitting: General Surgery

## 2020-01-30 ENCOUNTER — Ambulatory Visit (HOSPITAL_BASED_OUTPATIENT_CLINIC_OR_DEPARTMENT_OTHER): Payer: Medicaid Other | Admitting: Anesthesiology

## 2020-01-30 ENCOUNTER — Other Ambulatory Visit: Payer: Self-pay

## 2020-01-30 ENCOUNTER — Encounter (HOSPITAL_BASED_OUTPATIENT_CLINIC_OR_DEPARTMENT_OTHER)
Admission: RE | Disposition: A | Discharge status: Home / Self Care | Payer: Self-pay | Source: Home / Self Care | Attending: General Surgery

## 2020-01-30 ENCOUNTER — Ambulatory Visit (HOSPITAL_BASED_OUTPATIENT_CLINIC_OR_DEPARTMENT_OTHER)
Admission: RE | Admit: 2020-01-30 | Discharge: 2020-01-30 | Disposition: A | Discharge status: Home / Self Care | Payer: Medicaid Other | Attending: General Surgery | Admitting: General Surgery

## 2020-01-30 ENCOUNTER — Ambulatory Visit (HOSPITAL_COMMUNITY)
Admission: RE | Admit: 2020-01-30 | Discharge: 2020-01-30 | Disposition: A | Discharge status: Home / Self Care | Payer: Medicaid Other | Source: Ambulatory Visit | Attending: General Surgery | Admitting: General Surgery

## 2020-01-30 DIAGNOSIS — F17200 Nicotine dependence, unspecified, uncomplicated: Secondary | ICD-10-CM | POA: Insufficient documentation

## 2020-01-30 DIAGNOSIS — C774 Secondary and unspecified malignant neoplasm of inguinal and lower limb lymph nodes: Secondary | ICD-10-CM | POA: Insufficient documentation

## 2020-01-30 DIAGNOSIS — F419 Anxiety disorder, unspecified: Secondary | ICD-10-CM | POA: Diagnosis not present

## 2020-01-30 DIAGNOSIS — Z818 Family history of other mental and behavioral disorders: Secondary | ICD-10-CM | POA: Diagnosis not present

## 2020-01-30 DIAGNOSIS — Z8049 Family history of malignant neoplasm of other genital organs: Secondary | ICD-10-CM | POA: Diagnosis not present

## 2020-01-30 DIAGNOSIS — K219 Gastro-esophageal reflux disease without esophagitis: Secondary | ICD-10-CM | POA: Insufficient documentation

## 2020-01-30 DIAGNOSIS — Z809 Family history of malignant neoplasm, unspecified: Secondary | ICD-10-CM | POA: Diagnosis not present

## 2020-01-30 DIAGNOSIS — C4359 Malignant melanoma of other part of trunk: Secondary | ICD-10-CM | POA: Diagnosis not present

## 2020-01-30 DIAGNOSIS — F329 Major depressive disorder, single episode, unspecified: Secondary | ICD-10-CM | POA: Insufficient documentation

## 2020-01-30 DIAGNOSIS — K4090 Unilateral inguinal hernia, without obstruction or gangrene, not specified as recurrent: Secondary | ICD-10-CM | POA: Diagnosis not present

## 2020-01-30 DIAGNOSIS — N329 Bladder disorder, unspecified: Secondary | ICD-10-CM | POA: Insufficient documentation

## 2020-01-30 DIAGNOSIS — Z8249 Family history of ischemic heart disease and other diseases of the circulatory system: Secondary | ICD-10-CM | POA: Insufficient documentation

## 2020-01-30 DIAGNOSIS — C4371 Malignant melanoma of right lower limb, including hip: Secondary | ICD-10-CM | POA: Insufficient documentation

## 2020-01-30 DIAGNOSIS — C439 Malignant melanoma of skin, unspecified: Secondary | ICD-10-CM | POA: Diagnosis present

## 2020-01-30 DIAGNOSIS — F418 Other specified anxiety disorders: Secondary | ICD-10-CM | POA: Diagnosis not present

## 2020-01-30 DIAGNOSIS — Z8261 Family history of arthritis: Secondary | ICD-10-CM | POA: Diagnosis not present

## 2020-01-30 DIAGNOSIS — Z803 Family history of malignant neoplasm of breast: Secondary | ICD-10-CM | POA: Insufficient documentation

## 2020-01-30 HISTORY — PX: MELANOMA EXCISION WITH SENTINEL LYMPH NODE BIOPSY: SHX5267

## 2020-01-30 HISTORY — PX: INSERTION OF MESH: SHX5868

## 2020-01-30 HISTORY — DX: Anemia, unspecified: D64.9

## 2020-01-30 HISTORY — DX: Depression, unspecified: F32.A

## 2020-01-30 HISTORY — PX: INGUINAL HERNIA REPAIR: SHX194

## 2020-01-30 HISTORY — DX: Zoster without complications: B02.9

## 2020-01-30 HISTORY — DX: Gastro-esophageal reflux disease without esophagitis: K21.9

## 2020-01-30 HISTORY — DX: Anxiety disorder, unspecified: F41.9

## 2020-01-30 LAB — POCT PREGNANCY, URINE: Preg Test, Ur: NEGATIVE

## 2020-01-30 SURGERY — MELANOMA EXCISION WITH SENTINEL LYMPH NODE BIOPSY
Anesthesia: General | Site: Groin | Laterality: Right

## 2020-01-30 MED ORDER — OXYCODONE HCL 5 MG PO TABS: 5.0000 mg | ORAL_TABLET | Freq: Four times a day (QID) | ORAL | 0 refills | Status: DC | PRN

## 2020-01-30 MED ORDER — BUPIVACAINE HCL (PF) 0.25 % IJ SOLN
INTRAMUSCULAR | Status: DC | PRN
  Administered 2020-01-30: 9 mL

## 2020-01-30 MED ORDER — CHLORHEXIDINE GLUCONATE CLOTH 2 % EX PADS: 6.0000 | MEDICATED_PAD | Freq: Once | CUTANEOUS | Status: DC

## 2020-01-30 MED ORDER — FENTANYL CITRATE (PF) 100 MCG/2ML IJ SOLN
50.0000 ug | INTRAMUSCULAR | Status: AC | PRN
  Administered 2020-01-30: 25 ug via INTRAVENOUS
  Administered 2020-01-30: 50 ug via INTRAVENOUS
  Administered 2020-01-30: 25 ug via INTRAVENOUS

## 2020-01-30 MED ORDER — ONDANSETRON HCL 4 MG/2ML IJ SOLN
INTRAMUSCULAR | Status: DC | PRN
  Administered 2020-01-30: 4 mg via INTRAVENOUS

## 2020-01-30 MED ORDER — CEFAZOLIN SODIUM-DEXTROSE 2-4 GM/100ML-% IV SOLN
2.0000 g | INTRAVENOUS | Status: AC
  Administered 2020-01-30: 2 g via INTRAVENOUS

## 2020-01-30 MED ORDER — FENTANYL CITRATE (PF) 100 MCG/2ML IJ SOLN
INTRAMUSCULAR | Status: AC
  Filled 2020-01-30: qty 2

## 2020-01-30 MED ORDER — PROPOFOL 10 MG/ML IV BOLUS
INTRAVENOUS | Status: DC | PRN
  Administered 2020-01-30: 150 mg via INTRAVENOUS

## 2020-01-30 MED ORDER — FENTANYL CITRATE (PF) 100 MCG/2ML IJ SOLN
25.0000 ug | INTRAMUSCULAR | Status: DC | PRN
  Administered 2020-01-30: 50 ug via INTRAVENOUS
  Administered 2020-01-30: 15:00:00 25 ug via INTRAVENOUS

## 2020-01-30 MED ORDER — LIDOCAINE-EPINEPHRINE 0.5 %-1:200000 IJ SOLN
INTRAMUSCULAR | Status: DC | PRN
  Administered 2020-01-30: 9 mL

## 2020-01-30 MED ORDER — ACETAMINOPHEN 500 MG PO TABS
1000.0000 mg | ORAL_TABLET | ORAL | Status: AC
  Administered 2020-01-30: 1000 mg via ORAL

## 2020-01-30 MED ORDER — DEXAMETHASONE SODIUM PHOSPHATE 10 MG/ML IJ SOLN
INTRAMUSCULAR | Status: DC | PRN
  Administered 2020-01-30: 10 mg via INTRAVENOUS

## 2020-01-30 MED ORDER — METHYLENE BLUE 0.5 % INJ SOLN
INTRAVENOUS | Status: DC | PRN
  Administered 2020-01-30: 1 mL

## 2020-01-30 MED ORDER — MIDAZOLAM HCL 2 MG/2ML IJ SOLN
1.0000 mg | INTRAMUSCULAR | Status: DC | PRN
  Administered 2020-01-30: 2 mg via INTRAVENOUS

## 2020-01-30 MED ORDER — OXYCODONE HCL 5 MG PO TABS
5.0000 mg | ORAL_TABLET | Freq: Once | ORAL | Status: AC
  Administered 2020-01-30: 5 mg via ORAL

## 2020-01-30 MED ORDER — SCOPOLAMINE 1 MG/3DAYS TD PT72
MEDICATED_PATCH | TRANSDERMAL | Status: AC
  Filled 2020-01-30: qty 1

## 2020-01-30 MED ORDER — MIDAZOLAM HCL 2 MG/2ML IJ SOLN
INTRAMUSCULAR | Status: AC
  Filled 2020-01-30: qty 2

## 2020-01-30 MED ORDER — ACETAMINOPHEN 500 MG PO TABS
ORAL_TABLET | ORAL | Status: AC
  Filled 2020-01-30: qty 2

## 2020-01-30 MED ORDER — SCOPOLAMINE 1 MG/3DAYS TD PT72
1.0000 | MEDICATED_PATCH | TRANSDERMAL | Status: DC
  Administered 2020-01-30: 1.5 mg via TRANSDERMAL

## 2020-01-30 MED ORDER — LIDOCAINE 2% (20 MG/ML) 5 ML SYRINGE
INTRAMUSCULAR | Status: DC | PRN
  Administered 2020-01-30: 50 mg via INTRAVENOUS

## 2020-01-30 MED ORDER — ENSURE PRE-SURGERY PO LIQD: 296.0000 mL | Freq: Once | ORAL | Status: DC

## 2020-01-30 MED ORDER — CEFAZOLIN SODIUM-DEXTROSE 2-4 GM/100ML-% IV SOLN
INTRAVENOUS | Status: AC
  Filled 2020-01-30: qty 100

## 2020-01-30 MED ORDER — LACTATED RINGERS IV SOLN: INTRAVENOUS | Status: DC

## 2020-01-30 MED ORDER — OXYCODONE HCL 5 MG PO TABS
ORAL_TABLET | ORAL | Status: AC
  Filled 2020-01-30: qty 1

## 2020-01-30 MED ORDER — TECHNETIUM TC 99M SULFUR COLLOID FILTERED
1.0000 | Freq: Once | INTRAVENOUS | Status: AC | PRN
  Administered 2020-01-30: 1 via INTRADERMAL

## 2020-01-30 MED ORDER — EPHEDRINE SULFATE 50 MG/ML IJ SOLN
INTRAMUSCULAR | Status: DC | PRN
  Administered 2020-01-30: 15 mg via INTRAVENOUS

## 2020-01-30 SURGICAL SUPPLY — 57 items
BLADE CLIPPER SURG (BLADE) ×2 IMPLANT
BLADE HEX COATED 2.75 (ELECTRODE) ×5 IMPLANT
BLADE SURG 10 STRL SS (BLADE) ×5 IMPLANT
BLADE SURG 15 STRL LF DISP TIS (BLADE) ×3 IMPLANT
BLADE SURG 15 STRL SS (BLADE) ×5
BNDG CONFORM 2 STRL LF (GAUZE/BANDAGES/DRESSINGS) ×2 IMPLANT
BNDG ELASTIC 2X5.8 VLCR STR LF (GAUZE/BANDAGES/DRESSINGS) ×2 IMPLANT
CANISTER SUCT 1200ML W/VALVE (MISCELLANEOUS) ×5 IMPLANT
CHLORAPREP W/TINT 26 (MISCELLANEOUS) ×5 IMPLANT
CLIP VESOCCLUDE MED 6/CT (CLIP) ×9 IMPLANT
COVER BACK TABLE 60X90IN (DRAPES) ×5 IMPLANT
COVER MAYO STAND STRL (DRAPES) ×5 IMPLANT
COVER PROBE W GEL 5X96 (DRAPES) ×5 IMPLANT
DERMABOND ADVANCED (GAUZE/BANDAGES/DRESSINGS) ×2
DERMABOND ADVANCED .7 DNX12 (GAUZE/BANDAGES/DRESSINGS) ×3 IMPLANT
DRAPE IMP U-DRAPE 54X76 (DRAPES) ×2 IMPLANT
DRAPE U-SHAPE 76X120 STRL (DRAPES) ×4 IMPLANT
DRAPE UTILITY XL STRL (DRAPES) ×10 IMPLANT
ELECT REM PT RETURN 9FT ADLT (ELECTROSURGICAL) ×5
ELECTRODE REM PT RTRN 9FT ADLT (ELECTROSURGICAL) ×3 IMPLANT
GAUZE SPONGE 4X4 12PLY STRL LF (GAUZE/BANDAGES/DRESSINGS) ×2 IMPLANT
GAUZE XEROFORM 1X8 LF (GAUZE/BANDAGES/DRESSINGS) ×2 IMPLANT
GLOVE BIO SURGEON STRL SZ 6 (GLOVE) ×5 IMPLANT
GLOVE BIOGEL PI IND STRL 6.5 (GLOVE) ×3 IMPLANT
GLOVE BIOGEL PI INDICATOR 6.5 (GLOVE) ×2
GOWN STRL REUS W/ TWL LRG LVL3 (GOWN DISPOSABLE) ×3 IMPLANT
GOWN STRL REUS W/TWL 2XL LVL3 (GOWN DISPOSABLE) ×5 IMPLANT
GOWN STRL REUS W/TWL LRG LVL3 (GOWN DISPOSABLE) ×15
MESH PARIETEX PROGRIP RIGHT (Mesh General) ×2 IMPLANT
NDL HYPO 25X1 1.5 SAFETY (NEEDLE) ×6 IMPLANT
NDL SAFETY ECLIPSE 18X1.5 (NEEDLE) ×3 IMPLANT
NEEDLE HYPO 18GX1.5 SHARP (NEEDLE) ×5
NEEDLE HYPO 25X1 1.5 SAFETY (NEEDLE) ×10 IMPLANT
NS IRRIG 1000ML POUR BTL (IV SOLUTION) ×5 IMPLANT
PACK BASIN DAY SURGERY FS (CUSTOM PROCEDURE TRAY) ×5 IMPLANT
PENCIL SMOKE EVACUATOR (MISCELLANEOUS) ×5 IMPLANT
SLEEVE SCD COMPRESS KNEE MED (MISCELLANEOUS) ×5 IMPLANT
SPONGE INTESTINAL PEANUT (DISPOSABLE) ×2 IMPLANT
SPONGE LAP 18X18 RF (DISPOSABLE) ×7 IMPLANT
SUT ETHILON 3 0 PS 1 (SUTURE) ×4 IMPLANT
SUT MNCRL AB 4-0 PS2 18 (SUTURE) ×16 IMPLANT
SUT PROLENE 2 0 CT2 30 (SUTURE) ×2 IMPLANT
SUT SILK 2 0 SH (SUTURE) ×2 IMPLANT
SUT SILK 4 0 PS 2 (SUTURE) ×2 IMPLANT
SUT VIC AB 2-0 SH 27 (SUTURE) ×5
SUT VIC AB 2-0 SH 27XBRD (SUTURE) ×6 IMPLANT
SUT VIC AB 3-0 SH 27 (SUTURE) ×5
SUT VIC AB 3-0 SH 27X BRD (SUTURE) ×3 IMPLANT
SUT VICRYL 3-0 CR8 SH (SUTURE) ×8 IMPLANT
SYR BULB 3OZ (MISCELLANEOUS) ×5 IMPLANT
SYR CONTROL 10ML LL (SYRINGE) ×7 IMPLANT
SYR TB 1ML LL NO SAFETY (SYRINGE) ×5 IMPLANT
TOWEL GREEN STERILE FF (TOWEL DISPOSABLE) ×5 IMPLANT
TUBE CONNECTING 20'X1/4 (TUBING) ×1
TUBE CONNECTING 20X1/4 (TUBING) ×4 IMPLANT
UNDERPAD 30X36 HEAVY ABSORB (UNDERPADS AND DIAPERS) ×5 IMPLANT
YANKAUER SUCT BULB TIP NO VENT (SUCTIONS) ×5 IMPLANT

## 2020-01-30 NOTE — Anesthesia Preprocedure Evaluation (Addendum)
Anesthesia Evaluation  Patient identified by MRN, date of birth, ID band Patient awake    Reviewed: Allergy & Precautions, NPO status , Patient's Chart, lab work & pertinent test results  Airway Mallampati: I  TM Distance: >3 FB Neck ROM: Full    Dental no notable dental hx. (+) Poor Dentition, Missing, Chipped, Dental Advisory Given   Pulmonary neg pulmonary ROS, Current Smoker and Patient abstained from smoking.,    Pulmonary exam normal breath sounds clear to auscultation       Cardiovascular negative cardio ROS Normal cardiovascular exam Rhythm:Regular Rate:Normal     Neuro/Psych PSYCHIATRIC DISORDERS Anxiety Depression negative neurological ROS     GI/Hepatic Neg liver ROS, GERD  ,  Endo/Other  negative endocrine ROS  Renal/GU negative Renal ROS  negative genitourinary   Musculoskeletal negative musculoskeletal ROS (+)   Abdominal   Peds  Hematology negative hematology ROS (+)   Anesthesia Other Findings   Reproductive/Obstetrics                            Anesthesia Physical Anesthesia Plan  ASA: II  Anesthesia Plan: General   Post-op Pain Management:    Induction: Intravenous  PONV Risk Score and Plan: 2 and Midazolam, Dexamethasone and Ondansetron  Airway Management Planned: Oral ETT  Additional Equipment:   Intra-op Plan:   Post-operative Plan: Extubation in OR  Informed Consent: I have reviewed the patients History and Physical, chart, labs and discussed the procedure including the risks, benefits and alternatives for the proposed anesthesia with the patient or authorized representative who has indicated his/her understanding and acceptance.     Dental advisory given  Plan Discussed with: CRNA  Anesthesia Plan Comments:         Anesthesia Quick Evaluation

## 2020-01-30 NOTE — Interval H&P Note (Signed)
History and Physical Interval Note:  01/30/2020 11:50 AM  Donna Munoz  has presented today for surgery, with the diagnosis of RIGHT THIRD TOE MELANOMA.  The various methods of treatment have been discussed with the patient and family. After consideration of risks, benefits and other options for treatment, the patient has consented to  Procedure(s): WIDE LOWER EXCISION RIGHT THIRD TOE MELANOMA EXCISION WITH SENTINEL LYMPH NODE BIOPSY (Right) RIGHT INGUINAL HERNIA REPAIR WITH  MESH (Right) ABDOMINAL ADVANCEMENT FLAP (N/A) as a surgical intervention.  The patient's history has been reviewed, patient examined, no change in status, stable for surgery.  I have reviewed the patient's chart and labs.  Questions were answered to the patient's satisfaction.     [name]

## 2020-01-30 NOTE — Op Note (Signed)
PRE-OPERATIVE DIAGNOSIS: cT3aN0 right third toe melanoma, right inguinal hernia  POST-OPERATIVE DIAGNOSIS:  Same  PROCEDURE:  Procedure(s): Wide local excision 1 cm margins, advancement flap partial closure for defect 2 cm x 1 cm, right inguinal sentinel lymph node mapping and biopsy, right inguinal hernia repair with mesh  SURGEON:  Surgeon(s): [name], MD  ANESTHESIA:   local and general  DRAINS: none   LOCAL MEDICATIONS USED:  MARCAINE    and XYLOCAINE   SPECIMEN:  Source of Specimen:   axillary sentinel lymph nodes, wide local excision right third toe melanoma   FINDINGS:  Indirect right inguinal hernia along round ligament.  Three hot inguinal nodes inferior to inguinal ligament.  All were hot with cps 497, 210, and 353.  Background count was 0.  SLN #3 was also blue.    DISPOSITION OF SPECIMEN:  PATHOLOGY  COUNTS:  YES  PLAN OF CARE: Discharge to home after PACU  PATIENT DISPOSITION:  PACU - hemodynamically stable.    PROCEDURE:   Pt was identified in the holding area, taken to the OR, and placed supine on the OR table.  General anesthesia was induced.  Time out was performed according to the surgical safety checklist.  When all was correct, we continued.    The right foot, leg, lower abdomen and groin was prepped with Chloraprep and draped in the standard fashion.  One mL methylene blue was injected intradermally around the melanoma biopsy site.    Local anesthesia was administered An oblique incision was made. Dissection was carried down through the subcutaneous tissue with cautery to the external oblique fascia.  We opened the external oblique fascia along the direction of its fibers to the external ring.    The floor of the inguinal canal was inspected and the hernia sac was along the round ligament.  The ligament was divided.  There hernia sac was opened and inspected.  No intraabdominal contents were seen.  This was closed with a 2-0 silk pursestring suture.   I  used a right sided progrip mesh.  This was secured with 2-0 Prolene at the pubic tubercle.   The mesh was tucked underneath the external oblique fascia laterally.   The external oblique fascia was reapproximated with 2-0 Vicryl.  3-0 Vicryl was used to close the subcutaneous tissues and 4-0 Monocryl was used to close the skin in subcuticular fashion.    The point of maximum signal intensity was identified with the neoprobe.  A 3 cm incision was made with a #15 blade.  The subcutaneous tissues were divided with the cautery.  A Weitlaner retractor was used to assist with visualization.  The tonsil clamp was used to bluntly dissect the subcutaneous fat pad.  Three sentinel lymph nodes were identified as described above.  The lymphovascular channels were clipped with hemoclips.  The nodes were passed off as specimens.  Hemostasis was achieved with the cautery.  The axilla was irrigated and closed with 3-0 Vicryl deep dermal interrupted sutures and 4-0 Monocryl running subcuticular suture.  The axilla was dressed with dermabond.    The melanoma was identified and 1 cm margins were marked out.  Local was administered under the melanoma and the adjacent tissue.  A #10 blade was used to incise the skin around the melanoma.  The cautery was used to take the dissection down to the fascia.  The skin was marked in situ with orientation sutures.  The cautery was used to take the specimen off the fascia, and  it was passed off the table.    Skin hooks were used to elevate the edges of the incision and the skin was freed up in all directions to create advancement flaps.  This would not pull together all the way, as expected.  The portion on the foot was pulled together and closed with a 3-0 running baseball nylon.  The portion on the toe would only approximate part of the way.   This was reapproximated as much as possible with running mattress nylon sutures.    The hernia site and the inguinal node site was dressed with  dermabond.  The melanoma site was dressed with xeroform, kling, and an ace wrap.    Needle, sponge, and instrument counts were correct.  The patient was awakened from anesthesia and taken to the PACU in stable condition.

## 2020-01-30 NOTE — Discharge Instructions (Addendum)
[facility] Office Phone Number [phone]   POST OP INSTRUCTIONS  Always review your discharge instruction sheet given to you by the facility where your surgery was performed.  IF YOU HAVE DISABILITY OR FAMILY LEAVE FORMS, YOU MUST BRING THEM TO THE OFFICE FOR PROCESSING.  DO NOT GIVE THEM TO YOUR DOCTOR.  1. A prescription for pain medication may be given to you upon discharge.  Take your pain medication as prescribed, if needed.  If narcotic pain medicine is not needed, then you may take acetaminophen (Tylenol) or ibuprofen (Advil) as needed. 2. Take your usually prescribed medications unless otherwise directed 3. If you need a refill on your pain medication, please contact your pharmacy.  They will contact our office to request authorization.  Prescriptions will not be filled after 5pm or on week-ends. 4. You should eat very light the first 24 hours after surgery, such as soup, crackers, pudding, etc.  Resume your normal diet the day after surgery 5. It is common to experience some constipation if taking pain medication after surgery.  Increasing fluid intake and taking a stool softener will usually help or prevent this problem from occurring.  A mild laxative (Milk of Magnesia or Miralax) should be taken according to package directions if there are no bowel movements after 48 hours. 6. You may shower in 48 hours.  The surgical glue will flake off in 2-3 weeks.   7. ACTIVITIES:  No strenuous activity or heavy lifting for 1 week.   a. You may drive when you no longer are taking prescription pain medication, you can comfortably wear a seatbelt, and you can safely maneuver your car and apply brakes. b. RETURN TO WORK:  __________to be determined.  No lifting over 10 pounds for 4 weeks, no lifting over 30 pounds for an additional 2 weeks._______________ Dennis Bast should see your doctor in the office for a follow-up appointment approximately three-four weeks after your surgery.    WHEN  TO CALL YOUR DOCTOR: 1. Fever over 101.0 2. Nausea and/or vomiting. 3. Extreme swelling or bruising. 4. Continued bleeding from incision. 5. Increased pain, redness, or drainage from the incision.  The clinic staff is available to answer your questions during regular business hours.  Please don't hesitate to call and ask to speak to one of the nurses for clinical concerns.  If you have a medical emergency, go to the nearest emergency room or call 911.  A surgeon from [facility] is always on call at the hospital.  For further questions, please visit [email or web address]    Post Anesthesia Home Care Instructions  Activity: Get plenty of rest for the remainder of the day. A responsible individual must stay with you for 24 hours following the procedure.  For the next 24 hours, DO NOT: -Drive a car -Paediatric nurse -Drink alcoholic beverages -Take any medication unless instructed by your physician -Make any legal decisions or sign important papers.  Meals: Start with liquid foods such as gelatin or soup. Progress to regular foods as tolerated. Avoid greasy, spicy, heavy foods. If nausea and/or vomiting occur, drink only clear liquids until the nausea and/or vomiting subsides. Call your physician if vomiting continues.  Special Instructions/Symptoms: Your throat may feel dry or sore from the anesthesia or the breathing tube placed in your throat during surgery. If this causes discomfort, gargle with warm salt water. The discomfort should disappear within 24 hours.  If you had a scopolamine patch placed behind your ear for the management of post-  operative nausea and/or vomiting:  1. The medication in the patch is effective for 72 hours, after which it should be removed.  Wrap patch in a tissue and discard in the trash. Wash hands thoroughly with soap and water. 2. You may remove the patch earlier than 72 hours if you experience unpleasant side effects which may include  dry mouth, dizziness or visual disturbances. 3. Avoid touching the patch. Wash your hands with soap and water after contact with the patch.     May take Tylenol at 6pm

## 2020-01-30 NOTE — Transfer of Care (Signed)
Immediate Anesthesia Transfer of Care Note  Patient: Donna Munoz  Procedure(s) Performed: WIDE LOWER EXCISION RIGHT THIRD TOE MELANOMA EXCISION WITH SENTINEL LYMPH NODE BIOPSY (Right Foot) RIGHT INGUINAL HERNIA REPAIR WITH  MESH (Right Groin) INSERTION OF MESH (Right Groin)  Patient Location: PACU  Anesthesia Type:General  Level of Consciousness: awake, alert , oriented and drowsy  Airway & Oxygen Therapy: Patient Spontanous Breathing and Patient connected to face mask oxygen  Post-op Assessment: Report given to RN and Post -op Vital signs reviewed and stable  Post vital signs: Reviewed and stable  Last Vitals:  Vitals Value Taken Time  BP 105/67 01/30/20 1501  Temp    Pulse 85 01/30/20 1503  Resp 18 01/30/20 1504  SpO2 100 % 01/30/20 1503  Vitals shown include unvalidated device data.  Last Pain:  Vitals:   01/30/20 1153  TempSrc: Temporal  PainSc: 0-No pain         Complications: No apparent anesthesia complications

## 2020-01-30 NOTE — Anesthesia Procedure Notes (Signed)
Procedure Name: LMA Insertion Date/Time: 01/30/2020 1:33 PM Performed by: [place] Lena Gores C, CRNA Pre-anesthesia Checklist: Patient identified, Emergency Drugs available, Suction available and Patient being monitored Patient Re-evaluated:Patient Re-evaluated prior to induction Oxygen Delivery Method: Circle system utilized Preoxygenation: Pre-oxygenation with 100% oxygen Induction Type: IV induction Ventilation: Mask ventilation without difficulty LMA: LMA inserted LMA Size: 4.0 Number of attempts: 1 Airway Equipment and Method: Bite block Placement Confirmation: positive ETCO2 Tube secured with: Tape Dental Injury: Teeth and Oropharynx as per pre-operative assessment

## 2020-01-31 ENCOUNTER — Encounter: Payer: Self-pay | Admitting: *Deleted

## 2020-01-31 NOTE — Anesthesia Postprocedure Evaluation (Signed)
Anesthesia Post Note  Patient: Donna Munoz  Procedure(s) Performed: WIDE LOWER EXCISION RIGHT THIRD TOE MELANOMA EXCISION WITH SENTINEL LYMPH NODE BIOPSY (Right Foot) RIGHT INGUINAL HERNIA REPAIR WITH  MESH (Right Groin) INSERTION OF MESH (Right Groin)     Patient location during evaluation: PACU Anesthesia Type: General Level of consciousness: awake and alert Pain management: pain level controlled Vital Signs Assessment: post-procedure vital signs reviewed and stable Respiratory status: spontaneous breathing, nonlabored ventilation, respiratory function stable and patient connected to nasal cannula oxygen Cardiovascular status: blood pressure returned to baseline and stable Postop Assessment: no apparent nausea or vomiting Anesthetic complications: no    Last Vitals:  Vitals:   01/30/20 1610 01/30/20 1630  BP: 115/74   Pulse: 75   Resp: 18   Temp: 36.6 C   SpO2: 98% 100%    Last Pain:  Vitals:   01/31/20 0919  TempSrc:   PainSc: 5    Pain Goal:                   Donna Munoz Donna Munoz

## 2020-01-31 NOTE — Addendum Note (Signed)
Addendum  created 01/31/20 1152 by Tawni Millers, CRNA   Charge Capture section accepted

## 2020-02-01 ENCOUNTER — Ambulatory Visit (HOSPITAL_COMMUNITY): Payer: Medicaid Other | Admitting: Hematology

## 2020-02-05 ENCOUNTER — Other Ambulatory Visit: Payer: Self-pay

## 2020-02-05 ENCOUNTER — Ambulatory Visit (HOSPITAL_COMMUNITY)
Admission: RE | Admit: 2020-02-05 | Discharge: 2020-02-05 | Disposition: A | Discharge status: Home / Self Care | Payer: Medicaid Other | Source: Ambulatory Visit | Attending: Hematology | Admitting: Hematology

## 2020-02-05 DIAGNOSIS — E041 Nontoxic single thyroid nodule: Secondary | ICD-10-CM | POA: Insufficient documentation

## 2020-02-05 LAB — SURGICAL PATHOLOGY

## 2020-02-07 ENCOUNTER — Telehealth (HOSPITAL_COMMUNITY): Payer: Self-pay | Admitting: Surgery

## 2020-02-07 NOTE — Telephone Encounter (Signed)
Pt had left a voicemail this morning saying she had several concerns and wanted a nurse to call her back.  Upon calling the pt, she voiced that she was very frustrated with her care and felt like she was receiving different information from her physicians.  Dr. Delton Coombes had ordered a PET scan since the pt had melanoma on her toe, and Dr. Delton Coombes told her the PET scan only showed a nodule on her thyroid but no evidence of melanoma in her lymph nodes. The pt had been referred to Dr. Barry Dienes to have surgery on her toe, and the pt also had an inguinal hernia repair at the same time.  Sentinel lymph node biopsies were taken, and the pt saw on mychart that 3 of the lymph nodes were positive for melanoma.  The pt was frustrated that she was told the PET scan showed no evidence of melanoma in her lymph nodes, and that she had to find out about the positive lymph nodes on mychart.  I told her I would tell our manager Lupita Raider about the situation, as well as Dr. Delton Coombes.  Per Dr. Delton Coombes, the PET scan would not show microscopic metastatic melanomas.  Also, Dr. Delton Coombes wanted to work the pt in tomorrow to answer all of her questions and discuss the lymph node findings.  I called the pt back and she verbalized understanding of her appointment tomorrow at 11:45, and she was told to write down all of her questions so that Dr. Delton Coombes can address all of her concerns.

## 2020-02-08 ENCOUNTER — Inpatient Hospital Stay (HOSPITAL_COMMUNITY): Payer: Medicaid Other | Attending: Hematology | Admitting: Hematology

## 2020-02-08 ENCOUNTER — Encounter (HOSPITAL_COMMUNITY): Payer: Self-pay | Admitting: Hematology

## 2020-02-08 ENCOUNTER — Other Ambulatory Visit: Payer: Self-pay

## 2020-02-08 VITALS — BP 108/77 | HR 78 | Temp 97.7°F | Resp 16 | Wt 120.0 lb

## 2020-02-08 DIAGNOSIS — Z803 Family history of malignant neoplasm of breast: Secondary | ICD-10-CM | POA: Insufficient documentation

## 2020-02-08 DIAGNOSIS — Z8249 Family history of ischemic heart disease and other diseases of the circulatory system: Secondary | ICD-10-CM | POA: Insufficient documentation

## 2020-02-08 DIAGNOSIS — M7989 Other specified soft tissue disorders: Secondary | ICD-10-CM | POA: Diagnosis not present

## 2020-02-08 DIAGNOSIS — K219 Gastro-esophageal reflux disease without esophagitis: Secondary | ICD-10-CM | POA: Diagnosis not present

## 2020-02-08 DIAGNOSIS — R1909 Other intra-abdominal and pelvic swelling, mass and lump: Secondary | ICD-10-CM | POA: Diagnosis not present

## 2020-02-08 DIAGNOSIS — G43909 Migraine, unspecified, not intractable, without status migrainosus: Secondary | ICD-10-CM | POA: Insufficient documentation

## 2020-02-08 DIAGNOSIS — R519 Headache, unspecified: Secondary | ICD-10-CM | POA: Insufficient documentation

## 2020-02-08 DIAGNOSIS — D509 Iron deficiency anemia, unspecified: Secondary | ICD-10-CM | POA: Insufficient documentation

## 2020-02-08 DIAGNOSIS — C799 Secondary malignant neoplasm of unspecified site: Secondary | ICD-10-CM

## 2020-02-08 DIAGNOSIS — F17210 Nicotine dependence, cigarettes, uncomplicated: Secondary | ICD-10-CM | POA: Diagnosis not present

## 2020-02-08 DIAGNOSIS — C439 Malignant melanoma of skin, unspecified: Secondary | ICD-10-CM

## 2020-02-08 DIAGNOSIS — E041 Nontoxic single thyroid nodule: Secondary | ICD-10-CM | POA: Diagnosis not present

## 2020-02-08 DIAGNOSIS — F418 Other specified anxiety disorders: Secondary | ICD-10-CM | POA: Insufficient documentation

## 2020-02-08 DIAGNOSIS — Z800 Family history of malignant neoplasm of digestive organs: Secondary | ICD-10-CM | POA: Diagnosis not present

## 2020-02-08 DIAGNOSIS — C4371 Malignant melanoma of right lower limb, including hip: Secondary | ICD-10-CM | POA: Diagnosis not present

## 2020-02-08 DIAGNOSIS — Z809 Family history of malignant neoplasm, unspecified: Secondary | ICD-10-CM | POA: Insufficient documentation

## 2020-02-08 DIAGNOSIS — H538 Other visual disturbances: Secondary | ICD-10-CM | POA: Insufficient documentation

## 2020-02-08 MED ORDER — SCOPOLAMINE 1 MG/3DAYS TD PT72: 1.0000 | MEDICATED_PATCH | TRANSDERMAL | 2 refills | Status: DC

## 2020-02-08 NOTE — Progress Notes (Signed)
I met with patient today during the visit with Dr. Delton Coombes. After the visit, I recapped everything that Dr. Delton Coombes had discussed. I explained that I would get in touch with her surgeon and report the complaints that she has today.  I provided my contact information and explained for her to call me should she have any questions or concerns. She verbalizes understanding.    I emailed pathology and ordered BRAF on accesion # [id number].     I called Dr. Rosario Jacks office and spoke with Levada Dy, triage RN, and gave her report on today's visit.  Levada Dy said that they would contact the patient.

## 2020-02-08 NOTE — Patient Instructions (Signed)
[place] at [facility] Discharge Instructions  You were seen today by Dr. Delton Coombes. He went over your recent test results. You have micrometastasis in your sentinel lymph nodes, this is why they did not show on your PET scan. This means that you have stage 3 melanoma, which requires treatments with immunotherapy once a month for a year, which will help prevent your cancer from coming back. He will schedule you for a MRI of your brain. He will see you back after your scan for follow up.   Thank you for choosing [place] at [facility] to provide your oncology and hematology care.  To afford each patient quality time with our provider, please arrive at least 15 minutes before your scheduled appointment time.   If you have a lab appointment with the [facility] please come in thru the  Main Entrance and check in at the main information desk  You need to re-schedule your appointment should you arrive 10 or more minutes late.  We strive to give you quality time with our providers, and arriving late affects you and other patients whose appointments are after yours.  Also, if you no show three or more times for appointments you may be dismissed from the clinic at the providers discretion.     Again, thank you for choosing [facility].  Our hope is that these requests will decrease the amount of time that you wait before being seen by our physicians.       _____________________________________________________________  Should you have questions after your visit to [facility], please contact our office at [phone] between the hours of 8:00 a.m. and 4:30 p.m.  Voicemails left after 4:00 p.m. will not be returned until the following business day.  For prescription refill requests, have your pharmacy contact our office and allow 72 hours.    Cancer Center Support Programs:   > Cancer Support Group  2nd Tuesday of the month  1pm-2pm, Journey Room

## 2020-02-08 NOTE — Assessment & Plan Note (Addendum)
1.  Malignant melanoma, acral type: -Pathology on 11/22/2019 shows Breslow's depth 2 mm, positive ulceration, mitotic index 3, absent microsatellitosis and LVI, perineural invasion absent, tumor infiltrating lymphocytes nonbrisk, tumor regression absent, peripheral margins involved by tumor, deep margins involved by tumor. -PET scan on 12/20/2019 did not show any clear evidence of metastatic disease.  There was uptake in the right thyroid. -Resection and sentinel lymph node biopsy on 01/30/2020 by Dr. Barry Dienes. -We reviewed pathology report which showed 3 lymph nodes showing micrometastasis. -I have recommended nodal basin ultrasound surveillance and the adjuvant therapy with nivolumab.  In Checkmate 238 study, nivolumab improved 4-year distant metastatic free survival by 59%. -We will send tumor for BRAF 600 E mutation testing. -We will also do MRI of the brain with and without gadolinium to complete staging.  She reported left retro-orbital headaches for the past 3 days.  2.  Right thyroid nodule: -PET scan on 12/20/2019 showed uptake in the right thyroid. -Ultrasound on 02/05/2020 did not show any suspicious nodules.  3.  Microcytic anemia: -She has microcytic anemia with MCV of 76 and hemoglobin 10.  She was told to take iron tablet daily along with stool softener.  We plan to check labs soon.  4.  Family history: -Mother died of throat cancer.  Maternal grandmother had breast cancer.  Maternal aunt had pancreatic cancer. -Maternal grandfather had "bone cancer".  Maternal great grandmother died of melanoma. -Given extensive family history she will benefit from genetic testing.  We will discuss.

## 2020-02-08 NOTE — Progress Notes (Signed)
[name], [name] [address]   CLINIC:  Medical Oncology/Hematology  PCP:  Patient, No Pcp Per No address on file None   REASON FOR VISIT:  Follow-up for malignant melanoma.  CURRENT THERAPY: Consideration for adjuvant immunotherapy  BRIEF ONCOLOGIC HISTORY:  Oncology History   No history exists.     CANCER STAGING: Cancer Staging Melanoma of skin ([place]) Staging form: Melanoma of the Skin, AJCC 8th Edition - Clinical stage from 02/08/2020: Stage III (cT2b, cN2a, cM0) - Unsigned    INTERVAL HISTORY:  [name] 44 y.o. female seen for follow-up of malignant melanoma.  She underwent resection and sentinel lymph node biopsy on 01/30/2020 by Dr. Barry Dienes.  She was stressed out about the results of the pathology.  Reports retro-orbital headache on the left side for the last 3 to 4 days.  Also reports blurring of vision in the same eye.  Never had a history of migraines.    REVIEW OF SYSTEMS:  Review of Systems  Respiratory: Positive for shortness of breath.   Neurological: Positive for headaches.  Psychiatric/Behavioral: Positive for sleep disturbance.  All other systems reviewed and are negative.    PAST MEDICAL/SURGICAL HISTORY:  Past Medical History:  Diagnosis Date  . Anemia   . Anxiety   . Depression   . GERD (gastroesophageal reflux disease)   . Shingles    Past Surgical History:  Procedure Laterality Date  . BREAST LUMPECTOMY  age 44  . CESAREAN SECTION  01/06/2006  . CESAREAN SECTION  10/15/2016  . DENTAL SURGERY  2019  . INGUINAL HERNIA REPAIR Right 01/30/2020   Procedure: RIGHT INGUINAL HERNIA REPAIR WITH  MESH;  Surgeon: [name], MD;  Location: [place] [facility] [place];  Service: General;  Laterality: Right;  . INSERTION OF MESH Right 01/30/2020   Procedure: INSERTION OF MESH;  Surgeon: [name], MD;  Location: [place];  Service: General;  Laterality: Right;  . MELANOMA EXCISION WITH  SENTINEL LYMPH NODE BIOPSY Right 01/30/2020   Procedure: WIDE LOWER EXCISION RIGHT THIRD TOE MELANOMA EXCISION WITH SENTINEL LYMPH NODE BIOPSY;  Surgeon: [name], MD;  Location: [place];  Service: General;  Laterality: Right;     SOCIAL HISTORY:  Social History   Socioeconomic History  . Marital status: Legally Separated    Spouse name: Not on file  . Number of children: 2  . Years of education: Not on file  . Highest education level: Not on file  Occupational History  . Occupation: unemployed d/t COVID  Tobacco Use  . Smoking status: Current Every Day Smoker    Packs/day: 1.00  . Smokeless tobacco: Never Used  Substance and Sexual Activity  . Alcohol use: No  . Drug use: Yes    Types: Marijuana  . Sexual activity: Yes  Other Topics Concern  . Not on file  Social History Narrative  . Not on file   Social Determinants of Health   Financial Resource Strain: High Risk  . Difficulty of Paying Living Expenses: Hard  Food Insecurity: No Food Insecurity  . Worried About Charity fundraiser in the Last Year: Never true  . Ran Out of Food in the Last Year: Never true  Transportation Needs: No Transportation Needs  . Lack of Transportation (Medical): No  . Lack of Transportation (Non-Medical): No  Physical Activity: Inactive  . Days of Exercise per Week: 0 days  . Minutes of Exercise per Session: 0 min  Stress: Stress Concern Present  . Feeling of Stress : Very much  Social Connections: Moderately Isolated  . Frequency of Communication with Friends and Family: Twice a week  . Frequency of Social Gatherings with Friends and Family: Never  . Attends Religious Services: 1 to 4 times per year  . Active Member of Clubs or Organizations: No  . Attends Archivist Meetings: Never  . Marital Status: Separated  Intimate Partner Violence: Not At Risk  . Fear of Current or Ex-Partner: No  . Emotionally Abused: No  . Physically Abused: No  . Sexually  Abused: No    FAMILY HISTORY:  Family History  Problem Relation Age of Onset  . Heart disease Mother   . Cancer Mother   . Ovarian cysts Sister   . Healthy Brother   . Cancer Maternal Grandmother   . Heart attack Maternal Grandmother   . Cancer Maternal Grandfather   . Cancer Paternal Grandmother   . Healthy Sister   . Healthy Sister   . Psoriasis Daughter   . Healthy Son     CURRENT MEDICATIONS:  Outpatient Encounter Medications as of 02/08/2020  Medication Sig  . oxyCODONE (OXY IR/ROXICODONE) 5 MG immediate release tablet Take 1 tablet (5 mg total) by mouth every 6 (six) hours as needed for severe pain.  Marland Kitchen scopolamine (TRANSDERM-SCOP) 1 MG/3DAYS Place 1 patch (1.5 mg total) onto the skin every 3 (three) days.   No facility-administered encounter medications on file as of 02/08/2020.    ALLERGIES:  No Known Allergies   PHYSICAL EXAM:  ECOG Performance status: 0  Vitals:   02/08/20 1212  BP: 108/77  Pulse: 78  Resp: 16  Temp: 97.7 F (36.5 C)  SpO2: 100%   Filed Weights   02/08/20 1212  Weight: 120 lb (54.4 kg)    Physical Exam Vitals reviewed.  Constitutional:      Appearance: Normal appearance.  Skin:    General: Skin is warm.  Neurological:     General: No focal deficit present.     Mental Status: She is alert and oriented to person, place, and time.  Psychiatric:        Mood and Affect: Mood normal.        Behavior: Behavior normal.    Right third toe is bluish from recent surgery and dye injection.  Sutures intact.  No clear discharge.  No swelling noted.  No erythema.  LABORATORY DATA:  I have reviewed the labs as listed.  CBC    Component Value Date/Time   WBC 7.3 12/11/2019 1351   RBC 4.63 12/11/2019 1351   HGB 10.0 (L) 12/11/2019 1351   HGB 12.5 08/29/2007 1545   HCT 35.2 (L) 12/11/2019 1351   HCT 37.0 08/29/2007 1545   PLT 323 12/11/2019 1351   PLT 277 08/29/2007 1545   MCV 76.0 (L) 12/11/2019 1351   MCV 84.5 08/29/2007 1545   MCH  21.6 (L) 12/11/2019 1351   MCHC 28.4 (L) 12/11/2019 1351   RDW 18.7 (H) 12/11/2019 1351   RDW 16.2 (H) 08/29/2007 1545   LYMPHSABS 1.6 12/11/2019 1351   LYMPHSABS 2.3 08/29/2007 1545   MONOABS 0.5 12/11/2019 1351   MONOABS 0.3 08/29/2007 1545   EOSABS 0.1 12/11/2019 1351   EOSABS 0.2 08/29/2007 1545   BASOSABS 0.0 12/11/2019 1351   BASOSABS 0.0 08/29/2007 1545   CMP Latest Ref Rng & Units 12/11/2019  Glucose 70 - 99 mg/dL 92  BUN 6 - 20 mg/dL 9  Creatinine 0.44 - 1.00 mg/dL 0.64  Sodium 135 - 145 mmol/L 137  Potassium 3.5 - 5.1 mmol/L 4.5  Chloride 98 - 111 mmol/L 106  CO2 22 - 32 mmol/L 27  Calcium 8.9 - 10.3 mg/dL 9.7  Total Protein 6.5 - 8.1 g/dL 7.7  Total Bilirubin 0.3 - 1.2 mg/dL 0.5  Alkaline Phos 38 - 126 U/L 69  AST 15 - 41 U/L 23  ALT 0 - 44 U/L 16       DIAGNOSTIC IMAGING:  I have reviewed scans.     ASSESSMENT & PLAN:   Melanoma of skin ([place]) 1.  Malignant melanoma, acral type: -Pathology on 11/22/2019 shows Breslow's depth 2 mm, positive ulceration, mitotic index 3, absent microsatellitosis and LVI, perineural invasion absent, tumor infiltrating lymphocytes nonbrisk, tumor regression absent, peripheral margins involved by tumor, deep margins involved by tumor. -PET scan on 12/20/2019 did not show any clear evidence of metastatic disease.  There was uptake in the right thyroid. -Resection and sentinel lymph node biopsy on 01/30/2020 by Dr. Barry Dienes. -We reviewed pathology report which showed 3 lymph nodes showing micrometastasis. -I have recommended nodal basin ultrasound surveillance and the adjuvant therapy with nivolumab.  In Checkmate 238 study, nivolumab improved 4-year distant metastatic free survival by 59%. -We will send tumor for BRAF 600 E mutation testing. -We will also do MRI of the brain with and without gadolinium to complete staging.  She reported left retro-orbital headaches for the past 3 days.  2.  Right thyroid nodule: -PET scan on 12/20/2019  showed uptake in the right thyroid. -Ultrasound on 02/05/2020 did not show any suspicious nodules.  3.  Microcytic anemia: -She has microcytic anemia with MCV of 76 and hemoglobin 10.  She was told to take iron tablet daily along with stool softener.  We plan to check labs soon.  4.  Family history: -Mother died of throat cancer.  Maternal grandmother had breast cancer.  Maternal aunt had pancreatic cancer. -Maternal grandfather had "bone cancer".  Maternal great grandmother died of melanoma. -Given extensive family history she will benefit from genetic testing.  We will discuss.      Orders placed this encounter:  Orders Placed This Encounter  Procedures  . MR BRAIN W WO CONTRAST   Total time spent is 40 minutes with more than 50% of the time spent face-to-face discussing pathology report, further plan, counseling and coordination of care.   Derek Jack, MD [facility] [phone]

## 2020-02-13 ENCOUNTER — Other Ambulatory Visit: Payer: Self-pay | Admitting: General Surgery

## 2020-02-14 DIAGNOSIS — C4371 Malignant melanoma of right lower limb, including hip: Secondary | ICD-10-CM | POA: Diagnosis not present

## 2020-02-24 ENCOUNTER — Other Ambulatory Visit: Payer: Self-pay

## 2020-02-24 ENCOUNTER — Ambulatory Visit (HOSPITAL_COMMUNITY)
Admission: RE | Admit: 2020-02-24 | Discharge: 2020-02-24 | Disposition: A | Discharge status: Home / Self Care | Payer: Medicaid Other | Source: Ambulatory Visit | Attending: Hematology | Admitting: Hematology

## 2020-02-24 DIAGNOSIS — C799 Secondary malignant neoplasm of unspecified site: Secondary | ICD-10-CM | POA: Diagnosis not present

## 2020-02-24 DIAGNOSIS — R519 Headache, unspecified: Secondary | ICD-10-CM | POA: Insufficient documentation

## 2020-02-24 DIAGNOSIS — C4370 Malignant melanoma of unspecified lower limb, including hip: Secondary | ICD-10-CM | POA: Diagnosis not present

## 2020-02-24 DIAGNOSIS — C439 Malignant melanoma of skin, unspecified: Secondary | ICD-10-CM

## 2020-02-24 MED ORDER — GADOBUTROL 1 MMOL/ML IV SOLN
4.0000 mL | Freq: Once | INTRAVENOUS | Status: AC | PRN
  Administered 2020-02-24: 4 mL via INTRAVENOUS

## 2020-02-26 ENCOUNTER — Ambulatory Visit (HOSPITAL_COMMUNITY): Payer: Medicaid Other | Admitting: Hematology

## 2020-02-27 ENCOUNTER — Other Ambulatory Visit: Payer: Self-pay

## 2020-02-27 ENCOUNTER — Inpatient Hospital Stay (HOSPITAL_BASED_OUTPATIENT_CLINIC_OR_DEPARTMENT_OTHER): Payer: Medicaid Other | Admitting: Hematology

## 2020-02-27 ENCOUNTER — Encounter (HOSPITAL_COMMUNITY): Payer: Self-pay | Admitting: Hematology

## 2020-02-27 ENCOUNTER — Other Ambulatory Visit (HOSPITAL_COMMUNITY): Payer: Self-pay | Admitting: *Deleted

## 2020-02-27 ENCOUNTER — Ambulatory Visit (HOSPITAL_COMMUNITY)
Admission: RE | Admit: 2020-02-27 | Discharge: 2020-02-27 | Disposition: A | Discharge status: Home / Self Care | Payer: Medicaid Other | Source: Ambulatory Visit | Attending: Hematology | Admitting: Hematology

## 2020-02-27 ENCOUNTER — Ambulatory Visit (HOSPITAL_COMMUNITY): Payer: Medicaid Other | Admitting: Hematology

## 2020-02-27 VITALS — BP 96/63 | HR 81 | Temp 97.5°F | Resp 16 | Wt 106.4 lb

## 2020-02-27 DIAGNOSIS — C439 Malignant melanoma of skin, unspecified: Secondary | ICD-10-CM | POA: Diagnosis not present

## 2020-02-27 DIAGNOSIS — C4371 Malignant melanoma of right lower limb, including hip: Secondary | ICD-10-CM

## 2020-02-27 DIAGNOSIS — I700 Atherosclerosis of aorta: Secondary | ICD-10-CM | POA: Diagnosis not present

## 2020-02-27 MED ORDER — IOHEXOL 300 MG/ML  SOLN
100.0000 mL | Freq: Once | INTRAMUSCULAR | Status: AC | PRN
  Administered 2020-02-27: 16:00:00 100 mL via INTRAVENOUS

## 2020-02-27 NOTE — Assessment & Plan Note (Addendum)
1.  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 -Resection of the primary with sentinel lymph node biopsy on 01/30/2020, pathology showing 3 lymph nodes positive for micrometastasis. -She reported an infection in the right foot about 3 weeks ago.  She is currently on antibiotic Bactrim. -She reported swelling in the right groin which started on Saturday that is 02/24/2020. -Physical exam today shows about 2 x 3 cm lymph node swelling below the groin incision.  Most likely seroma.  I have recommended ultrasound of the groin. -We reviewed results of BRAF V600 E mutation which was negative.  MRI of the brain on 02/24/2020 did not show any intracranial metastasis.  I will findings consistent with chronic migraines. -Based on ulceration of the primary and lymph node metastasis, I have recommended adjuvant nivolumab for 1 year. -She has an appointment to follow-up with Dr. Barry Dienes this Friday. -We will likely start adjuvant immunotherapy in the next 1 to 2 weeks.  2.  Right thyroid nodule: -PET scan on 12/20/2019 showed uptake in the right thyroid.  Ultrasound on 02/05/2020 did not show any suspicious nodules.  3.  Microcytic anemia: -Labs on 12/11/2019 showed microcytic anemia with MCV 76.  She was recommended to take iron tablet daily with stool softener.  I plan to check labs soon.  4.  Family history: -Mother died of throat cancer.  Maternal grandmother had breast cancer.  Maternal aunt had pancreatic cancer.  Maternal grandfather had "bone cancer".  Maternal great-grandmother died of melanoma. -Given extensive family history, she will benefit from genetic testing.  We will make referral in the future.

## 2020-02-27 NOTE — Progress Notes (Signed)
[place], [place] [address]   CLINIC:  Medical Oncology/Hematology  PCP:  Patient, No Pcp Per No address on file None   REASON FOR VISIT:  Follow-up for malignant melanoma.  CURRENT THERAPY: Consideration for adjuvant immunotherapy  BRIEF ONCOLOGIC HISTORY:  Oncology History   No history exists.     CANCER STAGING: Cancer Staging Melanoma of skin ([place]) Staging form: Melanoma of the Skin, AJCC 8th Edition - Clinical stage from 02/08/2020: Stage III (cT2b, cN2a, cM0) - Unsigned    INTERVAL HISTORY:  Donna Munoz 44 y.o. female seen for follow-up of malignant melanoma.  She has done MRI of the brain.  She reportedly developed some infection in the right toe area and was started on antibiotics.  She is currently taking Bactrim twice daily.  She reports improvement in redness and swelling of the right foot.  She has noticed right groin swelling in the last 3 days.  She does not have any pain.  However it is tender to touch.  She has history of migraine headaches.    REVIEW OF SYSTEMS:  Review of Systems  Gastrointestinal: Positive for nausea.  Neurological: Positive for headaches.  Psychiatric/Behavioral: Positive for depression.  All other systems reviewed and are negative.    PAST MEDICAL/SURGICAL HISTORY:  Past Medical History:  Diagnosis Date  . Anemia   . Anxiety   . Depression   . GERD (gastroesophageal reflux disease)   . Shingles    Past Surgical History:  Procedure Laterality Date  . BREAST LUMPECTOMY  age 18  . CESAREAN SECTION  01/06/2006  . CESAREAN SECTION  10/15/2016  . DENTAL SURGERY  2019  . INGUINAL HERNIA REPAIR Right 01/30/2020   Procedure: RIGHT INGUINAL HERNIA REPAIR WITH  MESH;  Surgeon: [name], MD;  Location: [facility];  Service: General;  Laterality: Right;  . INSERTION OF MESH Right 01/30/2020   Procedure: INSERTION OF MESH;  Surgeon: [name], MD;  Location: [facility];  Service: General;  Laterality: Right;  . MELANOMA EXCISION WITH SENTINEL LYMPH NODE BIOPSY Right 01/30/2020   Procedure: WIDE LOWER EXCISION RIGHT THIRD TOE MELANOMA EXCISION WITH SENTINEL LYMPH NODE BIOPSY;  Surgeon: [name], MD;  Location: [facility];  Service: General;  Laterality: Right;     SOCIAL HISTORY:  Social History   Socioeconomic History  . Marital status: Legally Separated    Spouse name: Not on file  . Number of children: 2  . Years of education: Not on file  . Highest education level: Not on file  Occupational History  . Occupation: unemployed d/t COVID  Tobacco Use  . Smoking status: Current Every Day Smoker    Packs/day: 1.00  . Smokeless tobacco: Never Used  Substance and Sexual Activity  . Alcohol use: No  . Drug use: Yes    Types: Marijuana  . Sexual activity: Yes  Other Topics Concern  . Not on file  Social History Narrative  . Not on file   Social Determinants of Health   Financial Resource Strain: High Risk  . Difficulty of Paying Living Expenses: Hard  Food Insecurity: No Food Insecurity  . Worried About Charity fundraiser in the Last Year: Never true  . Ran Out of Food in the Last Year: Never true  Transportation Needs: No Transportation Needs  . Lack of Transportation (Medical): No  . Lack of Transportation (Non-Medical): No  Physical Activity: Inactive  .  Days of Exercise per Week: 0 days  . Minutes of Exercise per Session: 0 min  Stress: Stress Concern Present  . Feeling of Stress : Very much  Social Connections: Moderately Isolated  . Frequency of Communication with Friends and Family: Twice a week  . Frequency of Social Gatherings with Friends and Family: Never  . Attends Religious Services: 1 to 4 times per year  . Active Member of Clubs or Organizations: No  . Attends Archivist Meetings: Never  . Marital Status: Separated  Intimate Partner Violence: Not At Risk  . Fear of Current or  Ex-Partner: No  . Emotionally Abused: No  . Physically Abused: No  . Sexually Abused: No    FAMILY HISTORY:  Family History  Problem Relation Age of Onset  . Heart disease Mother   . Cancer Mother   . Ovarian cysts Sister   . Healthy Brother   . Cancer Maternal Grandmother   . Heart attack Maternal Grandmother   . Cancer Maternal Grandfather   . Cancer Paternal Grandmother   . Healthy Sister   . Healthy Sister   . Psoriasis Daughter   . Healthy Son     CURRENT MEDICATIONS:  Outpatient Encounter Medications as of 02/27/2020  Medication Sig  . oxyCODONE (OXY IR/ROXICODONE) 5 MG immediate release tablet Take 1 tablet (5 mg total) by mouth every 6 (six) hours as needed for severe pain.  Marland Kitchen scopolamine (TRANSDERM-SCOP) 1 MG/3DAYS Place 1 patch (1.5 mg total) onto the skin every 3 (three) days.   No facility-administered encounter medications on file as of 02/27/2020.    ALLERGIES:  No Known Allergies   PHYSICAL EXAM:  ECOG Performance status: 0  Vitals:   02/27/20 0800  BP: 96/63  Pulse: 81  Resp: 16  Temp: (!) 97.5 F (36.4 C)  SpO2: 100%   Filed Weights   02/27/20 0800  Weight: 106 lb 7 oz (48.3 kg)    Physical Exam Vitals reviewed.  Constitutional:      Appearance: Normal appearance.  Skin:    General: Skin is warm.  Neurological:     General: No focal deficit present.     Mental Status: She is alert and oriented to person, place, and time.  Psychiatric:        Mood and Affect: Mood normal.        Behavior: Behavior normal.    There is a 3 x 2 cm swelling in the right groin region below the incision, clinically consistent with seroma.  LABORATORY DATA:  I have reviewed the labs as listed.  CBC    Component Value Date/Time   WBC 7.3 12/11/2019 1351   RBC 4.63 12/11/2019 1351   HGB 10.0 (L) 12/11/2019 1351   HGB 12.5 08/29/2007 1545   HCT 35.2 (L) 12/11/2019 1351   HCT 37.0 08/29/2007 1545   PLT 323 12/11/2019 1351   PLT 277 08/29/2007 1545    MCV 76.0 (L) 12/11/2019 1351   MCV 84.5 08/29/2007 1545   MCH 21.6 (L) 12/11/2019 1351   MCHC 28.4 (L) 12/11/2019 1351   RDW 18.7 (H) 12/11/2019 1351   RDW 16.2 (H) 08/29/2007 1545   LYMPHSABS 1.6 12/11/2019 1351   LYMPHSABS 2.3 08/29/2007 1545   MONOABS 0.5 12/11/2019 1351   MONOABS 0.3 08/29/2007 1545   EOSABS 0.1 12/11/2019 1351   EOSABS 0.2 08/29/2007 1545   BASOSABS 0.0 12/11/2019 1351   BASOSABS 0.0 08/29/2007 1545   CMP Latest Ref Rng & Units 12/11/2019  Glucose 70 - 99 mg/dL 92  BUN 6 - 20 mg/dL 9  Creatinine 0.44 - 1.00 mg/dL 0.64  Sodium 135 - 145 mmol/L 137  Potassium 3.5 - 5.1 mmol/L 4.5  Chloride 98 - 111 mmol/L 106  CO2 22 - 32 mmol/L 27  Calcium 8.9 - 10.3 mg/dL 9.7  Total Protein 6.5 - 8.1 g/dL 7.7  Total Bilirubin 0.3 - 1.2 mg/dL 0.5  Alkaline Phos 38 - 126 U/L 69  AST 15 - 41 U/L 23  ALT 0 - 44 U/L 16       DIAGNOSTIC IMAGING:  I have reviewed scans.     ASSESSMENT & PLAN:   Melanoma of skin ([place]) 1.  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 -Resection of the primary with sentinel lymph node biopsy on 01/30/2020, pathology showing 3 lymph nodes positive for micrometastasis. -She reported an infection in the right foot about 3 weeks ago.  She is currently on antibiotic Bactrim. -She reported swelling in the right groin which started on Saturday that is 02/24/2020. -Physical exam today shows about 2 x 3 cm lymph node swelling below the groin incision.  Most likely seroma.  I have recommended ultrasound of the groin. -We reviewed results of BRAF V600 E mutation which was negative.  MRI of the brain on 02/24/2020 did not show any intracranial metastasis.  I will findings consistent with chronic migraines. -Based on ulceration of the primary and lymph node metastasis, I have recommended adjuvant nivolumab for 1 year. -She has an appointment to follow-up with  Dr. Barry Dienes this Friday. -We will likely start adjuvant immunotherapy in the next 1 to 2 weeks.  2.  Right thyroid nodule: -PET scan on 12/20/2019 showed uptake in the right thyroid.  Ultrasound on 02/05/2020 did not show any suspicious nodules.  3.  Microcytic anemia: -Labs on 12/11/2019 showed microcytic anemia with MCV 76.  She was recommended to take iron tablet daily with stool softener.  I plan to check labs soon.  4.  Family history: -Mother died of throat cancer.  Maternal grandmother had breast cancer.  Maternal aunt had pancreatic cancer.  Maternal grandfather had "bone cancer".  Maternal great-grandmother died of melanoma. -Given extensive family history, she will benefit from genetic testing.  We will make referral in the future.      Orders placed this encounter:  Orders Placed This Encounter  Procedures  . [place] RT LOWER EXTREM LTD SOFT TISSUE NON VASCULAR  . US PELVIS LIMITED (TRANSABDOMINAL ONLY)  . CT Pelvis W Contrast   Total time spent is 30 minutes with more than 50% of the time spent face-to-face discussing scan results, further plan, counseling and coordination of care.   Derek Jack, MD [place] [phone]

## 2020-02-27 NOTE — Patient Instructions (Addendum)
[place] at [facility] Discharge Instructions  You were seen today by Dr. Delton Coombes. He went over your recent results. He will see you back in 2 weeks for labs, treatment and follow up.   Thank you for choosing [place] at [facility] to provide your oncology and hematology care.  To afford each patient quality time with our provider, please arrive at least 15 minutes before your scheduled appointment time.   If you have a lab appointment with the [facility] please come in thru the  Main Entrance and check in at the main information desk  You need to re-schedule your appointment should you arrive 10 or more minutes late.  We strive to give you quality time with our providers, and arriving late affects you and other patients whose appointments are after yours.  Also, if you no show three or more times for appointments you may be dismissed from the clinic at the providers discretion.     Again, thank you for choosing [facility].  Our hope is that these requests will decrease the amount of time that you wait before being seen by our physicians.       _____________________________________________________________  Should you have questions after your visit to [facility], please contact our office at [phone] between the hours of 8:00 a.m. and 4:30 p.m.  Voicemails left after 4:00 p.m. will not be returned until the following business day.  For prescription refill requests, have your pharmacy contact our office and allow 72 hours.    Cancer Center Support Programs:   > Cancer Support Group  2nd Tuesday of the month 1pm-2pm, Journey Room

## 2020-02-28 ENCOUNTER — Ambulatory Visit (HOSPITAL_COMMUNITY): Payer: Medicaid Other

## 2020-02-29 ENCOUNTER — Encounter (HOSPITAL_COMMUNITY): Payer: Self-pay | Admitting: *Deleted

## 2020-02-29 NOTE — Progress Notes (Signed)
Patient called clinic today and reports irritation to her surgical site.  She states that it is right at her underwear line.  I have asked Dr. Delton Coombes, and he wants patient to follow up with surgeon.   I returned patient's call and she verbalizes understanding. She has an appointment with the surgeon tomorrow.

## 2020-03-01 ENCOUNTER — Other Ambulatory Visit: Payer: Self-pay | Admitting: General Surgery

## 2020-03-04 ENCOUNTER — Other Ambulatory Visit (HOSPITAL_COMMUNITY)
Admission: RE | Admit: 2020-03-04 | Discharge: 2020-03-04 | Disposition: A | Discharge status: Home / Self Care | Payer: Medicaid Other | Source: Ambulatory Visit | Attending: General Surgery | Admitting: General Surgery

## 2020-03-04 ENCOUNTER — Other Ambulatory Visit: Payer: Self-pay

## 2020-03-04 DIAGNOSIS — Z01812 Encounter for preprocedural laboratory examination: Secondary | ICD-10-CM | POA: Diagnosis not present

## 2020-03-04 DIAGNOSIS — Z20822 Contact with and (suspected) exposure to covid-19: Secondary | ICD-10-CM | POA: Insufficient documentation

## 2020-03-04 LAB — SARS CORONAVIRUS 2 (TAT 6-24 HRS): SARS Coronavirus 2: NEGATIVE

## 2020-03-05 ENCOUNTER — Other Ambulatory Visit: Payer: Self-pay

## 2020-03-05 ENCOUNTER — Encounter (HOSPITAL_COMMUNITY): Payer: Self-pay | Admitting: General Surgery

## 2020-03-05 NOTE — H&P (Signed)
Donna Munoz Appointment: 03/01/2020 11:15 AM Location: [facility] Patient #: [id number] DOB: 10/12/1976 Separated / Language: Donna Munoz / Race: White Female   History of Present Illness Donna Klein MD; 03/01/2020 12:09 PM) The patient is a 44 year old female who presents for a follow-up for Malignant melanoma. Pt is a 44 yo F referred for consultation by Dr. 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 The patient was seen by Dr. Barkley Munoz (podiatrist at [facility]) for a foot lesions. This was biopsied and seen to be at least a 2 mm melanoma. The patient was actually being seen for an ingrown toenail, but the skin lesion was seen and addressed as well. The patient had a freckle type skin spot there for many years, but did note that it had been getting darker and getting a bit larger. Her maternal grandmother had melanoma and breast cancer. Her maternal aunt had pancreatic cancer. Mother had "throat cancer." Maternal grandfather had bone cancer.   She has no other personal history of cancer  Of note, she has been having right groin pain as well. She has been feeling a bulge, especially when she lifts.    She underwent WLE, advancement flap closure, SLN bx, repair of right inguinal hernia repair 02/13/2020. She had an infection and got antibiotics started. She continues to have severe pain. She has a bit of fluid build up in her right groin.   pathology 02/13/2020 Consult Slide , nail plate and nail bed derived keratin, right third MALIGNANT MELANOMA ACRAL TYPE, DEEP MARGIN INVOLVED, PLEASE SEE TABLE. MELANOMA TABLE (AJCC 8TH EDITION*) SPECIMEN ANATOMIC SITE: RIGHT THIRD NAIL PLATE AND NAIL BED HISTOLOGIC TYPE: ACRAL LENTIGINOUS BRESLOW'S DEPTH/MAXIMUM TUMOR THICKNESS: AT LEAST 2.0 MM* CLARK/ANATOMIC LEVEL: IV MARGINS PERIPHERAL MARGINS: INVOLVED DEEP MARGIN: INVOLVED* ULCERATION: PRESENT SATELLITOSIS: ABSENT MITOTIC INDEX: 3 PER MILLIMETER  SQUARED LYMPHO-VASCULAR INVASION: ABSENT NEUROTROPISM: ABSENT TUMOR-INFILTRATING LYMPHOCYTES: NON-BRISK TUMOR REGRESSION: ABSENT LYMPH NODES (IF APPLICABLE): N/A PATHOLOGIC STAGE: AT LEAST PT2B, NX, MX       pathology [facility] accession number [id number].  acral melanoma, at least 2 mm positive deep and peripheral margins. + ulceration 3 mit/mm2 no microsatellitosis, no perineural invasion, no LVI, non brisk TIL  *Urgent office 02/20/20 She underwent wide local excision 1 cm margins, advancement flap partial closure, right inguinal sentinel lymph node mapping and biopsy for cT3aN0 right third toe melanoma and right inguinal hernia repair with mesh on 01/30/20 by Dr. Barry Munoz. She was seen by Dr. Dema Munoz in urgent office on 02/12/20 at which time her sutures were removed due to spontaneous dehiscence of the wound. She was given a prescription for Bactrim due to infection. She has been sending and photos of the area and was advised to be seen again today in the office. She states there is some pain at the base of her third toe. The distal portion of her toe does not have sensation. She states there is some drainage on the bandage. She is concerned about its appearance. She states she broke out in sweats a few days ago but has not had any obvious fevers recently.   Allergies Donna Munoz, CMA; 03/01/2020 11:08 AM) No Known Drug Allergies  [01/16/2020]: Allergies Reconciled   Medication History Donna Munoz, CMA; 03/01/2020 11:08 AM) Keflex (500MG  Capsule, 1 (one) Oral four times daily, Taken starting 02/08/2020) Active. Ondansetron (4MG  Tablet Disint, 1 (one) Oral every eight hours, as needed, Taken starting 02/07/2020) Active. oxyCODONE HCl (5MG  Tablet, 1 (one) Oral  every six hours, as needed, Taken starting 02/05/2020) Active. Medications Reconciled    Review of Systems Donna Klein MD; 03/01/2020 12:09 PM) All other systems negative  Vitals Donna Munoz  CMA; 03/01/2020 11:09 AM) 03/01/2020 11:08 AM Weight: 106.4 lb Height: 62in Body Surface Area: 1.46 m Body Mass Index: 19.46 kg/m  Temp.: 98.31F (Tympanic)  Pulse: 129 (Regular)  BP: 112/62(Sitting, Left Arm, Standard)       Physical Exam Donna Klein MD; 03/01/2020 12:09 PM) Integumentary Note: right third toe tender and swollen.     Assessment & Plan Donna Klein MD; 03/01/2020 12:10 PM) MALIGNANT MELANOMA OF SKIN OF TOE, RIGHT (C43.71) Story: s/p WLE R 3rd toe 3/23 Impression: Will complete toe amputation for positive margin and infection.  Will place port. Current Plans Restarted oxyCODONE HCl 5 MG Oral Tablet, 1 (one) Tablet every six hours, as needed, #30, 5 days starting 03/01/2020, No Refill. Local Order: Patient Instructions: severe pain not controlled with tylenol/ibuprofen You are being scheduled for surgery- Our schedulers will call you.  You should hear from our office's scheduling department within 5 working days about the location, date, and time of surgery. We try to make accommodations for patient's preferences in scheduling surgery, but sometimes the OR schedule or the surgeon's schedule prevents [place] from making those accommodations.  If you have not heard from our office [phone]) in 5 working days, call the office and ask for your surgeon's nurse.  If you have other questions about your diagnosis, plan, or surgery, call the office and ask for your surgeon's nurse.    Signed electronically by Donna Klein, MD (03/01/2020 12:10 PM)

## 2020-03-05 NOTE — Progress Notes (Signed)
Spoke with pt for pre-op call. Pt denies cardiac history, HTN or Diabetes. Pt had surgery recently.  Pt will come by today and pick up an Pre-Surgery Ensure drink. Instructed pt not to eat food after midnight Wednesday, but may have clear liquids until 11:15 AM Thursday (list of clear liquids given to pt), pt instructed to drink the Pre-surgery Ensure between 11:00 AM and 11:15 AM (will be the last thing she drinks prior to surgery).  Covid test done 03/04/20 and it is negative. Pt states she has been in quarantine since the test and understands that she stays in quarantine until she comes to the hospital on Thursday.

## 2020-03-07 ENCOUNTER — Encounter (HOSPITAL_COMMUNITY)
Admission: RE | Disposition: A | Discharge status: Home / Self Care | Payer: Self-pay | Source: Home / Self Care | Attending: General Surgery

## 2020-03-07 ENCOUNTER — Ambulatory Visit (HOSPITAL_COMMUNITY): Payer: Medicaid Other | Admitting: Registered Nurse

## 2020-03-07 ENCOUNTER — Other Ambulatory Visit: Payer: Self-pay

## 2020-03-07 ENCOUNTER — Encounter (HOSPITAL_COMMUNITY): Payer: Self-pay | Admitting: General Surgery

## 2020-03-07 ENCOUNTER — Ambulatory Visit (HOSPITAL_COMMUNITY)
Admission: RE | Admit: 2020-03-07 | Discharge: 2020-03-07 | Disposition: A | Discharge status: Home / Self Care | Payer: Medicaid Other | Attending: General Surgery | Admitting: General Surgery

## 2020-03-07 ENCOUNTER — Ambulatory Visit (HOSPITAL_COMMUNITY): Payer: Medicaid Other

## 2020-03-07 DIAGNOSIS — Z800 Family history of malignant neoplasm of digestive organs: Secondary | ICD-10-CM | POA: Insufficient documentation

## 2020-03-07 DIAGNOSIS — K219 Gastro-esophageal reflux disease without esophagitis: Secondary | ICD-10-CM | POA: Diagnosis not present

## 2020-03-07 DIAGNOSIS — Z452 Encounter for adjustment and management of vascular access device: Secondary | ICD-10-CM | POA: Insufficient documentation

## 2020-03-07 DIAGNOSIS — X58XXXA Exposure to other specified factors, initial encounter: Secondary | ICD-10-CM | POA: Insufficient documentation

## 2020-03-07 DIAGNOSIS — C4371 Malignant melanoma of right lower limb, including hip: Secondary | ICD-10-CM | POA: Diagnosis not present

## 2020-03-07 DIAGNOSIS — S301XXA Contusion of abdominal wall, initial encounter: Secondary | ICD-10-CM | POA: Diagnosis not present

## 2020-03-07 DIAGNOSIS — Z803 Family history of malignant neoplasm of breast: Secondary | ICD-10-CM | POA: Diagnosis not present

## 2020-03-07 DIAGNOSIS — L7634 Postprocedural seroma of skin and subcutaneous tissue following other procedure: Secondary | ICD-10-CM | POA: Diagnosis not present

## 2020-03-07 DIAGNOSIS — Z419 Encounter for procedure for purposes other than remedying health state, unspecified: Secondary | ICD-10-CM

## 2020-03-07 DIAGNOSIS — F17200 Nicotine dependence, unspecified, uncomplicated: Secondary | ICD-10-CM | POA: Insufficient documentation

## 2020-03-07 DIAGNOSIS — F418 Other specified anxiety disorders: Secondary | ICD-10-CM | POA: Diagnosis not present

## 2020-03-07 HISTORY — PX: IRRIGATION AND DEBRIDEMENT ABSCESS: SHX5252

## 2020-03-07 HISTORY — DX: Headache, unspecified: R51.9

## 2020-03-07 HISTORY — PX: PORTACATH PLACEMENT: SHX2246

## 2020-03-07 HISTORY — PX: AMPUTATION TOE: SHX6595

## 2020-03-07 LAB — COMPREHENSIVE METABOLIC PANEL
ALT: 17 U/L (ref 0–44)
AST: 26 U/L (ref 15–41)
Albumin: 3.8 g/dL (ref 3.5–5.0)
Alkaline Phosphatase: 79 U/L (ref 38–126)
Anion gap: 8 (ref 5–15)
BUN: 7 mg/dL (ref 6–20)
CO2: 20 mmol/L — ABNORMAL LOW (ref 22–32)
Calcium: 9.4 mg/dL (ref 8.9–10.3)
Chloride: 111 mmol/L (ref 98–111)
Creatinine, Ser: 0.69 mg/dL (ref 0.44–1.00)
GFR calc Af Amer: >60 mL/min (ref >60)
GFR calc non Af Amer: >60 mL/min (ref >60)
Glucose, Bld: 94 mg/dL (ref 70–99)
Potassium: 3.9 mmol/L (ref 3.5–5.1)
Sodium: 139 mmol/L (ref 135–145)
Total Bilirubin: 0.4 mg/dL (ref 0.3–1.2)
Total Protein: 6.9 g/dL (ref 6.5–8.1)

## 2020-03-07 LAB — CBC
HCT: 32.2 % — ABNORMAL LOW (ref 36.0–46.0)
Hemoglobin: 9.2 g/dL — ABNORMAL LOW (ref 12.0–15.0)
MCH: 21.5 pg — ABNORMAL LOW (ref 26.0–34.0)
MCHC: 28.6 g/dL — ABNORMAL LOW (ref 30.0–36.0)
MCV: 75.2 fL — ABNORMAL LOW (ref 80.0–100.0)
Platelets: 272 10*3/uL (ref 150–400)
RBC: 4.28 MIL/uL (ref 3.87–5.11)
RDW: 19.4 % — ABNORMAL HIGH (ref 11.5–15.5)
WBC: 6.5 10*3/uL (ref 4.0–10.5)
nRBC: 0 % (ref 0.0–0.2)

## 2020-03-07 LAB — POCT PREGNANCY, URINE: Preg Test, Ur: NEGATIVE

## 2020-03-07 SURGERY — AMPUTATION, TOE
Anesthesia: Regional | Site: Toe | Laterality: Right

## 2020-03-07 MED ORDER — FENTANYL CITRATE (PF) 250 MCG/5ML IJ SOLN
INTRAMUSCULAR | Status: AC
  Filled 2020-03-07: qty 5

## 2020-03-07 MED ORDER — ENSURE PRE-SURGERY PO LIQD: 296.0000 mL | Freq: Once | ORAL | Status: DC

## 2020-03-07 MED ORDER — MIDAZOLAM HCL 2 MG/2ML IJ SOLN: 2.0000 mg | Freq: Once | INTRAMUSCULAR | Status: AC

## 2020-03-07 MED ORDER — SODIUM CHLORIDE 0.9 % IV SOLN
INTRAVENOUS | Status: AC
  Filled 2020-03-07: qty 1.2

## 2020-03-07 MED ORDER — BUPIVACAINE HCL (PF) 0.25 % IJ SOLN
INTRAMUSCULAR | Status: AC
  Filled 2020-03-07: qty 30

## 2020-03-07 MED ORDER — MIDAZOLAM HCL 2 MG/2ML IJ SOLN: 1.0000 mg | Freq: Once | INTRAMUSCULAR | Status: DC

## 2020-03-07 MED ORDER — MIDAZOLAM HCL 2 MG/2ML IJ SOLN
INTRAMUSCULAR | Status: AC
  Administered 2020-03-07: 2 mg via INTRAVENOUS
  Filled 2020-03-07: qty 2

## 2020-03-07 MED ORDER — MEPERIDINE HCL 25 MG/ML IJ SOLN: 6.2500 mg | INTRAMUSCULAR | Status: DC | PRN

## 2020-03-07 MED ORDER — CHLORHEXIDINE GLUCONATE CLOTH 2 % EX PADS: 6.0000 | MEDICATED_PAD | Freq: Once | CUTANEOUS | Status: DC

## 2020-03-07 MED ORDER — DEXAMETHASONE SODIUM PHOSPHATE 4 MG/ML IJ SOLN
INTRAMUSCULAR | Status: DC | PRN
Start: 2020-03-07 — End: 2020-03-07
  Administered 2020-03-07: 10 mg via INTRAVENOUS

## 2020-03-07 MED ORDER — PROPOFOL 10 MG/ML IV BOLUS
INTRAVENOUS | Status: DC | PRN
  Administered 2020-03-07: 120 mg via INTRAVENOUS

## 2020-03-07 MED ORDER — HEPARIN SOD (PORK) LOCK FLUSH 100 UNIT/ML IV SOLN
INTRAVENOUS | Status: AC
  Filled 2020-03-07: qty 5

## 2020-03-07 MED ORDER — FENTANYL CITRATE (PF) 100 MCG/2ML IJ SOLN
INTRAMUSCULAR | Status: DC | PRN
  Administered 2020-03-07: 50 ug via INTRAVENOUS

## 2020-03-07 MED ORDER — MIDAZOLAM HCL 2 MG/2ML IJ SOLN
INTRAMUSCULAR | Status: AC
  Filled 2020-03-07: qty 2

## 2020-03-07 MED ORDER — LIDOCAINE-EPINEPHRINE 1 %-1:100000 IJ SOLN
INTRAMUSCULAR | Status: DC | PRN
  Administered 2020-03-07: 20 mL

## 2020-03-07 MED ORDER — ACETAMINOPHEN 500 MG PO TABS: 1000.0000 mg | ORAL_TABLET | ORAL | Status: AC

## 2020-03-07 MED ORDER — SODIUM CHLORIDE 0.9 % IV SOLN
INTRAVENOUS | Status: DC | PRN
  Administered 2020-03-07: 14:00:00 500 mL

## 2020-03-07 MED ORDER — HYDROMORPHONE HCL 1 MG/ML IJ SOLN: 0.2500 mg | INTRAMUSCULAR | Status: DC | PRN

## 2020-03-07 MED ORDER — CEFAZOLIN SODIUM-DEXTROSE 2-4 GM/100ML-% IV SOLN
INTRAVENOUS | Status: AC
  Filled 2020-03-07: qty 100

## 2020-03-07 MED ORDER — ONDANSETRON HCL 4 MG/2ML IJ SOLN
INTRAMUSCULAR | Status: DC | PRN
  Administered 2020-03-07: 4 mg via INTRAVENOUS

## 2020-03-07 MED ORDER — LIDOCAINE-EPINEPHRINE 1 %-1:100000 IJ SOLN
INTRAMUSCULAR | Status: AC
  Filled 2020-03-07: qty 1

## 2020-03-07 MED ORDER — FENTANYL CITRATE (PF) 100 MCG/2ML IJ SOLN
INTRAMUSCULAR | Status: AC
  Administered 2020-03-07: 100 ug via INTRAVENOUS
  Filled 2020-03-07: qty 2

## 2020-03-07 MED ORDER — LACTATED RINGERS IV SOLN: INTRAVENOUS | Status: DC

## 2020-03-07 MED ORDER — OXYCODONE HCL 5 MG/5ML PO SOLN: 5.0000 mg | Freq: Once | ORAL | Status: DC | PRN

## 2020-03-07 MED ORDER — BUPIVACAINE HCL (PF) 0.25 % IJ SOLN
INTRAMUSCULAR | Status: DC | PRN
  Administered 2020-03-07: 20 mL

## 2020-03-07 MED ORDER — PROMETHAZINE HCL 25 MG/ML IJ SOLN: 6.2500 mg | INTRAMUSCULAR | Status: DC | PRN

## 2020-03-07 MED ORDER — EPHEDRINE SULFATE-NACL 50-0.9 MG/10ML-% IV SOSY
PREFILLED_SYRINGE | INTRAVENOUS | Status: DC | PRN
  Administered 2020-03-07 (×3): 10 mg via INTRAVENOUS

## 2020-03-07 MED ORDER — HEPARIN SOD (PORK) LOCK FLUSH 100 UNIT/ML IV SOLN
INTRAVENOUS | Status: DC | PRN
  Administered 2020-03-07: 500 [IU] via INTRAVENOUS

## 2020-03-07 MED ORDER — BUPIVACAINE-EPINEPHRINE (PF) 0.5% -1:200000 IJ SOLN
INTRAMUSCULAR | Status: DC | PRN
  Administered 2020-03-07: 30 mL via PERINEURAL

## 2020-03-07 MED ORDER — 0.9 % SODIUM CHLORIDE (POUR BTL) OPTIME
TOPICAL | Status: DC | PRN
  Administered 2020-03-07: 1000 mL

## 2020-03-07 MED ORDER — GABAPENTIN 300 MG PO CAPS: 300.0000 mg | ORAL_CAPSULE | ORAL | Status: AC

## 2020-03-07 MED ORDER — CEFAZOLIN SODIUM-DEXTROSE 2-4 GM/100ML-% IV SOLN
2.0000 g | INTRAVENOUS | Status: AC
  Administered 2020-03-07: 2 g via INTRAVENOUS

## 2020-03-07 MED ORDER — OXYCODONE HCL 5 MG PO TABS
5.0000 mg | ORAL_TABLET | Freq: Four times a day (QID) | ORAL | 0 refills | Status: DC | PRN
Start: 2020-03-07 — End: 2021-11-07

## 2020-03-07 MED ORDER — GABAPENTIN 300 MG PO CAPS
ORAL_CAPSULE | ORAL | Status: AC
  Administered 2020-03-07: 300 mg via ORAL
  Filled 2020-03-07: qty 1

## 2020-03-07 MED ORDER — CLONIDINE HCL (ANALGESIA) 100 MCG/ML EP SOLN
EPIDURAL | Status: DC | PRN
Start: 2020-03-07 — End: 2020-03-07
  Administered 2020-03-07: 100 ug

## 2020-03-07 MED ORDER — LIDOCAINE 2% (20 MG/ML) 5 ML SYRINGE
INTRAMUSCULAR | Status: DC | PRN
  Administered 2020-03-07: 100 mg via INTRAVENOUS

## 2020-03-07 MED ORDER — OXYCODONE HCL 5 MG PO TABS: 5.0000 mg | ORAL_TABLET | Freq: Once | ORAL | Status: DC | PRN

## 2020-03-07 MED ORDER — ACETAMINOPHEN 500 MG PO TABS
ORAL_TABLET | ORAL | Status: AC
  Administered 2020-03-07: 1000 mg via ORAL
  Filled 2020-03-07: qty 2

## 2020-03-07 MED ORDER — FENTANYL CITRATE (PF) 100 MCG/2ML IJ SOLN: 100.0000 ug | Freq: Once | INTRAMUSCULAR | Status: AC

## 2020-03-07 SURGICAL SUPPLY — 52 items
BAG DECANTER FOR FLEXI CONT (MISCELLANEOUS) ×5 IMPLANT
BIOPATCH RED 1 DISK 7.0 (GAUZE/BANDAGES/DRESSINGS) ×4 IMPLANT
BIOPATCH RED 1IN DISK 7.0MM (GAUZE/BANDAGES/DRESSINGS) ×1
BNDG COHESIVE 3X5 TAN STRL LF (GAUZE/BANDAGES/DRESSINGS) ×5 IMPLANT
BNDG GAUZE ELAST 4 BULKY (GAUZE/BANDAGES/DRESSINGS) ×5 IMPLANT
CANISTER SUCT 3000ML PPV (MISCELLANEOUS) ×5 IMPLANT
CHLORAPREP W/TINT 26 (MISCELLANEOUS) ×5 IMPLANT
COVER SURGICAL LIGHT HANDLE (MISCELLANEOUS) ×5 IMPLANT
COVER TRANSDUCER ULTRASND GEL (DRAPE) IMPLANT
COVER WAND RF STERILE (DRAPES) IMPLANT
DECANTER SPIKE VIAL GLASS SM (MISCELLANEOUS) ×10 IMPLANT
DERMABOND ADVANCED (GAUZE/BANDAGES/DRESSINGS) ×2
DERMABOND ADVANCED .7 DNX12 (GAUZE/BANDAGES/DRESSINGS) ×3 IMPLANT
DRAPE C-ARM 42X120 X-RAY (DRAPES) ×5 IMPLANT
DRAPE CHEST BREAST 15X10 FENES (DRAPES) ×5 IMPLANT
DRAPE ORTHO SPLIT 87X125 STRL (DRAPES) ×10 IMPLANT
DRAPE WARM FLUID 44X44 (DRAPES) IMPLANT
DRSG TEGADERM 4X4.75 (GAUZE/BANDAGES/DRESSINGS) ×5 IMPLANT
ELECT COATED BLADE 2.86 ST (ELECTRODE) ×5 IMPLANT
ELECT REM PT RETURN 9FT ADLT (ELECTROSURGICAL) ×5
ELECTRODE REM PT RTRN 9FT ADLT (ELECTROSURGICAL) ×3 IMPLANT
EVACUATOR SILICONE 100CC (DRAIN) ×5 IMPLANT
GAUZE 4X4 16PLY RFD (DISPOSABLE) ×5 IMPLANT
GAUZE PACKING IODOFORM 1/4X15 (PACKING) ×5 IMPLANT
GAUZE SPONGE 4X4 12PLY STRL (GAUZE/BANDAGES/DRESSINGS) ×5 IMPLANT
GAUZE XEROFORM 1X8 LF (GAUZE/BANDAGES/DRESSINGS) ×5 IMPLANT
GEL ULTRASOUND 20GR AQUASONIC (MISCELLANEOUS) IMPLANT
GLOVE BIO SURGEON STRL SZ 6 (GLOVE) ×5 IMPLANT
GLOVE INDICATOR 6.5 STRL GRN (GLOVE) ×5 IMPLANT
GOWN STRL REUS W/ TWL LRG LVL3 (GOWN DISPOSABLE) ×6 IMPLANT
GOWN STRL REUS W/TWL 2XL LVL3 (GOWN DISPOSABLE) ×5 IMPLANT
GOWN STRL REUS W/TWL LRG LVL3 (GOWN DISPOSABLE) ×10
KIT BASIN OR (CUSTOM PROCEDURE TRAY) ×5 IMPLANT
KIT PORT POWER 8FR ISP CVUE (Port) ×5 IMPLANT
KIT TURNOVER KIT B (KITS) ×5 IMPLANT
NEEDLE 22X1 1/2 (OR ONLY) (NEEDLE) ×5 IMPLANT
NS IRRIG 1000ML POUR BTL (IV SOLUTION) ×5 IMPLANT
PAD ARMBOARD 7.5X6 YLW CONV (MISCELLANEOUS) ×5 IMPLANT
PENCIL BUTTON HOLSTER BLD 10FT (ELECTRODE) ×5 IMPLANT
POSITIONER HEAD DONUT 9IN (MISCELLANEOUS) ×5 IMPLANT
SUT ETHILON 2 0 FS 18 (SUTURE) ×10 IMPLANT
SUT MON AB 4-0 PC3 18 (SUTURE) ×5 IMPLANT
SUT PROLENE 2 0 SH DA (SUTURE) ×10 IMPLANT
SUT VIC AB 3-0 SH 27 (SUTURE) ×5
SUT VIC AB 3-0 SH 27X BRD (SUTURE) ×3 IMPLANT
SYR 5ML LUER SLIP (SYRINGE) ×5 IMPLANT
TOWEL GREEN STERILE (TOWEL DISPOSABLE) ×5 IMPLANT
TOWEL GREEN STERILE FF (TOWEL DISPOSABLE) ×5 IMPLANT
TRAY LAPAROSCOPIC MC (CUSTOM PROCEDURE TRAY) ×5 IMPLANT
TUBE CONNECTING 12'X1/4 (SUCTIONS)
TUBE CONNECTING 12X1/4 (SUCTIONS) IMPLANT
YANKAUER SUCT BULB TIP NO VENT (SUCTIONS) IMPLANT

## 2020-03-07 NOTE — Discharge Instructions (Addendum)
[place] Office Phone Number [phone]   POST OP INSTRUCTIONS  Always review your discharge instruction sheet given to you by the facility where your surgery was performed.  IF YOU HAVE DISABILITY OR FAMILY LEAVE FORMS, YOU MUST BRING THEM TO THE OFFICE FOR PROCESSING.  DO NOT GIVE THEM TO YOUR DOCTOR.  1. A prescription for pain medication may be given to you upon discharge.  Take your pain medication as prescribed, if needed.  If narcotic pain medicine is not needed, then you may take acetaminophen (Tylenol) or ibuprofen (Advil) as needed. 2. Take your usually prescribed medications unless otherwise directed 3. If you need a refill on your pain medication, please contact your pharmacy.  They will contact our office to request authorization.  Prescriptions will not be filled after 5pm or on week-ends. 4. You should eat very light the first 24 hours after surgery, such as soup, crackers, pudding, etc.  Resume your normal diet the day after surgery 5. It is common to experience some constipation if taking pain medication after surgery.  Increasing fluid intake and taking a stool softener will usually help or prevent this problem from occurring.  A mild laxative (Milk of Magnesia or Miralax) should be taken according to package directions if there are no bowel movements after 48 hours. 6. You may shower in 48 hours.  The surgical glue will flake off in 2-3 weeks.   7. For the foot:  Ok to change outer dressings today or tomorrow.  Ok to let wet and allow to dry before redressing.  Remove packing by Saturday May 1 and cover with non stick gauze.   8. ACTIVITIES:  No strenuous activity or heavy lifting for 1 week.   a. You may drive when you no longer are taking prescription pain medication, you can comfortably wear a seatbelt, and you can safely maneuver your car and apply brakes. b. RETURN TO WORK:  __________to be determined if applicable.  _______________ Dennis Bast should see your  doctor in the office for a follow-up appointment approximately three-four weeks after your surgery.    WHEN TO CALL YOUR DOCTOR: 1. Fever over 101.0 2. Nausea and/or vomiting. 3. Extreme swelling or bruising. 4. Continued bleeding from incision. 5. Increased pain, redness, or drainage from the incision.  The clinic staff is available to answer your questions during regular business hours.  Please don't hesitate to call and ask to speak to one of the nurses for clinical concerns.  If you have a medical emergency, go to the nearest emergency room or call 911.  A surgeon from [facility] is always on call at the hospital.  For further questions, please visit [email or web address]     [facility] Surgical drains are used to remove extra fluid that normally builds up in a surgical wound after surgery. A surgical drain helps to heal a surgical wound. Different kinds of surgical drains include:  Active drains. These drains use suction to pull drainage away from the surgical wound. Drainage flows through a tube to a container outside of the body. With these drains, you need to keep the bulb or the drainage container flat (compressed) at all times, except while you empty it. Flattening the bulb or container creates suction.  Passive drains. These drains allow fluid to drain naturally, by gravity. Drainage flows through a tube to a bandage (dressing) or a container outside of the body. Passive drains do not need to be emptied. A drain is placed during  surgery. Right after surgery, drainage is usually bright red and a little thicker than water. The drainage may gradually turn yellow or pink and become thinner. It is likely that your health care provider will remove the drain when the drainage stops or when the amount decreases to 1-2 Tbsp (15-30 mL) during a 24-hour period. Supplies needed:  Tape.  Germ-free cleaning solution (sterile saline).  Cotton swabs.  Split  gauze drain sponge: 4 x 4 inches (10 x 10 cm).  Gauze square: 4 x 4 inches (10 x 10 cm). How to care for your surgical drain Care for your drain as told by your health care provider. This is important to help prevent infection. If your drain is placed at your back, or any other hard-to-reach area, ask another person to assist you in performing the following tasks: General care  Keep the skin around the drain dry and covered with a dressing at all times.  Check your drain area every day for signs of infection. Check for: ? Redness, swelling, or pain. ? Pus or a bad smell. ? Cloudy drainage. ? Tenderness or pressure at the drain exit site. Changing the dressing Follow instructions from your health care provider about how to change your dressing. Change your dressing at least once a day. Change it more often if needed to keep the dressing dry. Make sure you: 1. Gather your supplies. 2. Wash your hands with soap and water before you change your dressing. If soap and water are not available, use hand sanitizer. 3. Remove the old dressing. Avoid using scissors to do that. 4. Wash your hands with soap and water again after removing the old dressing. 5. Use sterile saline to clean your skin around the drain. You may need to use a cotton swab to clean the skin. 6. Place the tube through the slit in a drain sponge. Place the drain sponge so that it covers your wound. 7. Place the gauze square or another drain sponge on top of the drain sponge that is on the wound. Make sure the tube is between those layers. 8. Tape the dressing to your skin. 9. Tape the drainage tube to your skin 1-2 inches (2.5-5 cm) below the place where the tube enters your body. Taping keeps the tube from pulling on any stitches (sutures) that you have. 10. Wash your hands with soap and water. 11. Write down the color of your drainage and how often you change your dressing. How to empty your active drain  1. Make sure that you  have a measuring cup that you can empty your drainage into. 2. Wash your hands with soap and water. If soap and water are not available, use hand sanitizer. 3. Loosen any pins or clips that hold the tube in place. 4. If your health care provider tells you to strip the tube to prevent clots and tube blockages: ? Hold the tube at the skin with one hand. Use your other hand to pinch the tubing with your thumb and first finger. ? Gently move your fingers down the tube while squeezing very lightly. This clears any drainage, clots, or tissue from the tube. ? You may need to do this several times each day to keep the tube clear. Do not pull on the tube. 5. Open the bulb cap or the drain plug. Do not touch the inside of the cap or the bottom of the plug. 6. Turn the device upside down and gently squeeze. 7. Empty all of the  drainage into the measuring cup. 8. Compress the bulb or the container and replace the cap or the plug. To compress the bulb or the container, squeeze it firmly in the middle while you close the cap or plug the container. 9. Write down the amount of drainage that you have in each 24-hour period. If you have less than 2 Tbsp (30 mL) of drainage during 24 hours, contact your health care provider. 10. Flush the drainage down the toilet. 11. Wash your hands with soap and water. Contact a health care provider if:  You have redness, swelling, or pain around your drain area.  You have pus or a bad smell coming from your drain area.  You have a fever or chills.  The skin around your drain is warm to the touch.  The amount of drainage that you have is increasing instead of decreasing.  You have drainage that is cloudy.  There is a sudden stop or a sudden decrease in the amount of drainage that you have.  Your drain tube falls out.  Your active drain does not stay compressed after you empty it. Summary  Surgical drains are used to remove extra fluid that normally builds up in a  surgical wound after surgery.  Different kinds of surgical drains include active drains and passive drains. Active drains use suction to pull drainage away from the surgical wound, and passive drains allow fluid to drain naturally.  It is important to care for your drain to prevent infection. If your drain is placed at your back, or any other hard-to-reach area, ask another person to assist you.  Contact your health care provider if you have redness, swelling, or pain around your drain area. This information is not intended to replace advice given to you by your health care provider. Make sure you discuss any questions you have with your health care provider. Document Revised: 11/30/2018 Document Reviewed: 11/30/2018 Elsevier Patient Education  2020 [employer].

## 2020-03-07 NOTE — Interval H&P Note (Signed)
History and Physical Interval Note:  03/07/2020 1:14 PM  Donna Munoz  has presented today for surgery, with the diagnosis of RIGHT THIRD TOE MELANOMA.  The various methods of treatment have been discussed with the patient and family. After consideration of risks, benefits and other options for treatment, the patient has consented to  Procedure(s): RIGHT THIRD TOE AMPUTATION (Right) INSERTION PORT-A-CATH (N/A)  and aspiration of right groin seroma as a surgical intervention.  The patient's history has been reviewed, patient examined, no change in status, stable for surgery.  I have reviewed the patient's chart and labs.  Questions were answered to the patient's satisfaction.     [name]

## 2020-03-07 NOTE — Op Note (Signed)
PREOPERATIVE DIAGNOSIS:  Melanoma right third toe, positive margins. At least pT2bN3     POSTOPERATIVE DIAGNOSIS:  Same     PROCEDURE: Left subclavian port placement, Bard ClearVue Power Port, MRI safe, 8-French. Right third toe amputation at the PIP level, placement of drain in right groin seroma.       SURGEON:  [name], MD     ASSISTANT: Judyann Munson, RNFA   ANESTHESIA:  General   FINDINGS:  Good venous return, easy flush, and tip of the catheter and   SVC 18.5 cm.      SPECIMEN:  None.      ESTIMATED BLOOD LOSS:  Minimal.      COMPLICATIONS:  None known.      PROCEDURE:  Pt was identified in the holding area and taken to   the operating room, where patient was placed supine on the operating room   table.  General anesthesia was induced.  Patient's arms were tucked and the upper   chest and neck were prepped and draped in sterile fashion. Additionally the right leg/groin were prepped and draped in sterile fashion.  Time-out was   performed according to the surgical safety check list.  When all was   correct, we continued.   Local anesthetic was administered over this   area at the angle of the clavicle.  The vein was accessed with 1 pass(es) of the needle. There was good venous return and the wire passed easily with no ectopy.   Fluoroscopy was used to confirm that the wire was in the vena cava.      The patient was placed back level and the area for the pocket was anethetized   with local anesthetic.  A 3-cm transverse incision was made with a #15   blade.  Cautery was used to divide the subcutaneous tissues down to the   pectoralis muscle.  An [employer] retractor was used to elevate the skin   while a pocket was created on top of the pectoralis fascia.  The port   was placed into the pocket to confirm that it was of adequate size.  The   catheter was preattached to the port.  The port was then secured to the   pectoralis fascia with four 2-0 Prolene sutures.  These  were clamped and   not tied down yet.    The catheter was tunneled through to the wire exit   site.  The catheter was placed along the wire to determine what length it should be to be in the SVC.  The catheter was cut at 18.5 cm.  The tunneler sheath and dilator were passed over the wire and the dilator and wire were removed.  The catheter was advanced through the tunneler sheath and the tunneler sheath was pulled away.  Care was taken to keep the catheter in the tunneler sheath as this occurred. This was advanced and the tunneler sheath was removed.  There was good venous   return and easy flush of the catheter.  The Prolene sutures were tied   down to the pectoral fascia.  The skin was reapproximated using 3-0   Vicryl interrupted deep dermal sutures.    Fluoroscopy was used to re-confirm good position of the catheter.  The skin   was then closed using 4-0 Monocryl in a subcuticular fashion.  The port was flushed with concentrated heparin flush as well.  The wounds were then cleaned, dried, and dressed with Dermabond.    Attention was  directed to the third toe next.  The #10 blade was used to excise the skin of the toe at the webbing.  This was done around the back.  A triangle was taken down onto the foot where her prior excision was.  This was previously infected, and the prior excision site was removed.  The subcutaneous tissues were taken down to the bone and hemostasis was achieved with cautery.  The bone cutter was used to divide the middle phalanx.  The rongeurs were used to pull the remaining middle phalanx out.  A 2-0 nylon was placed anteriorly and one posteriorly to pull the wound together partially.  This was then dressed with iodoform, xeroform, gauze, and kerlix.    Lastly, the right groin was addressed.  The seroma was partially aspirated, but continued to fill.  A 10 Fr drain was inserted into the seroma.  This was secured with a 2-0 nylon and connected to a bulb.    The patient was  awakened from anesthesia and taken to the PACU in stable condition.  Needle, sponge, and instrument counts were correct.               [name], MD

## 2020-03-07 NOTE — Anesthesia Procedure Notes (Signed)
Anesthesia Regional Block: Popliteal block   Pre-Anesthetic Checklist: ,, timeout performed, Correct Patient, Correct Site, Correct Laterality, Correct Procedure, Correct Position, site marked, Risks and benefits discussed,  Surgical consent,  Pre-op evaluation,  At surgeon's request and post-op pain management  Laterality: Right  Prep: chloraprep       Needles:  Injection technique: Single-shot  Needle Type: Stimiplex     Needle Length: 10cm  Needle Gauge: 21     Additional Needles:   Procedures:,,,, ultrasound used (permanent image in chart),,,,  Motor weakness within 5 minutes.  Narrative:  Start time: 03/07/2020 12:59 PM End time: 03/07/2020 1:04 PM Injection made incrementally with aspirations every 5 mL.  Performed by: Personally  Anesthesiologist: Nolon Nations, MD  Additional Notes: Nerve located and needle positioned with direct ultrasound guidance. Good perineural spread. Patient tolerated well.

## 2020-03-07 NOTE — Anesthesia Preprocedure Evaluation (Addendum)
Anesthesia Evaluation  Patient identified by MRN, date of birth, ID band Patient awake    Reviewed: Allergy & Precautions, NPO status , Patient's Chart, lab work & pertinent test results  Airway Mallampati: I  TM Distance: >3 FB Neck ROM: Full    Dental no notable dental hx. (+) Poor Dentition, Missing, Chipped, Dental Advisory Given   Pulmonary neg pulmonary ROS, Current Smoker and Patient abstained from smoking.,    Pulmonary exam normal breath sounds clear to auscultation       Cardiovascular negative cardio ROS Normal cardiovascular exam Rhythm:Regular Rate:Normal     Neuro/Psych  Headaches, PSYCHIATRIC DISORDERS Anxiety Depression    GI/Hepatic Neg liver ROS, GERD  ,  Endo/Other  negative endocrine ROS  Renal/GU negative Renal ROS  negative genitourinary   Musculoskeletal negative musculoskeletal ROS (+)   Abdominal   Peds  Hematology  (+) Blood dyscrasia, anemia ,   Anesthesia Other Findings   Reproductive/Obstetrics                             Anesthesia Physical  Anesthesia Plan  ASA: III  Anesthesia Plan: General   Post-op Pain Management: GA combined w/ Regional for post-op pain   Induction: Intravenous  PONV Risk Score and Plan: 2 and Midazolam, Dexamethasone, Ondansetron and Treatment may vary due to age or medical condition  Airway Management Planned: LMA  Additional Equipment: None  Intra-op Plan:   Post-operative Plan: Extubation in OR  Informed Consent: I have reviewed the patients History and Physical, chart, labs and discussed the procedure including the risks, benefits and alternatives for the proposed anesthesia with the patient or authorized representative who has indicated his/her understanding and acceptance.     Dental advisory given  Plan Discussed with: CRNA  Anesthesia Plan Comments:        Anesthesia Quick Evaluation

## 2020-03-07 NOTE — Progress Notes (Signed)
Orthopedic Tech Progress Note Patient Details:  Donna Munoz 03-04-1976 [id number] PACU RN called requesting a POST OP SHOE Ortho Devices Type of Ortho Device: Postop shoe/boot Ortho Device/Splint Location: RLE Ortho Device/Splint Interventions: Ordered, Application   Post Interventions Patient Tolerated: Well Instructions Provided: Care of device   [name] 03/07/2020, 4:59 PM

## 2020-03-07 NOTE — Anesthesia Procedure Notes (Signed)
Procedure Name: LMA Insertion Date/Time: 03/07/2020 1:37 PM Performed by: Trinna Post., CRNA Pre-anesthesia Checklist: Patient identified, Emergency Drugs available, Suction available, Patient being monitored and Timeout performed Patient Re-evaluated:Patient Re-evaluated prior to induction Oxygen Delivery Method: Circle system utilized Preoxygenation: Pre-oxygenation with 100% oxygen Induction Type: IV induction Ventilation: Mask ventilation without difficulty LMA: LMA inserted LMA Size: 4.0 Number of attempts: 1 Placement Confirmation: positive ETCO2 and breath sounds checked- equal and bilateral Tube secured with: Tape Dental Injury: Teeth and Oropharynx as per pre-operative assessment

## 2020-03-07 NOTE — Transfer of Care (Signed)
Immediate Anesthesia Transfer of Care Note  Patient: Donna Munoz  Procedure(s) Performed: RIGHT THIRD TOE AMPUTATION (Right Toe) INSERTION PORT-A-CATH (N/A Chest) Aspiration of Right Groin Seroma (Right Groin)  Patient Location: PACU  Anesthesia Type:General and Regional  Level of Consciousness: awake, alert  and oriented  Airway & Oxygen Therapy: Patient Spontanous Breathing and Patient connected to nasal cannula oxygen  Post-op Assessment: Report given to RN and Post -op Vital signs reviewed and stable  Post vital signs: Reviewed and stable  Last Vitals:  Vitals Value Taken Time  BP 110/56 03/07/20 1450  Temp    Pulse 98 03/07/20 1451  Resp 16 03/07/20 1451  SpO2 100 % 03/07/20 1451  Vitals shown include unvalidated device data.  Last Pain:  Vitals:   03/07/20 1320  TempSrc:   PainSc: 0-No pain      Patients Stated Pain Goal: 2 ([id number])  Complications: No apparent anesthesia complications

## 2020-03-08 NOTE — Anesthesia Postprocedure Evaluation (Signed)
Anesthesia Post Note  Patient: Donna Munoz  Procedure(s) Performed: RIGHT THIRD TOE AMPUTATION (Right Toe) INSERTION PORT-A-CATH (N/A Chest) Aspiration of Right Groin Seroma (Right Groin)     Patient location during evaluation: PACU Anesthesia Type: Regional and General Level of consciousness: awake and alert Pain management: pain level controlled Vital Signs Assessment: post-procedure vital signs reviewed and stable Respiratory status: spontaneous breathing, nonlabored ventilation, respiratory function stable and patient connected to nasal cannula oxygen Cardiovascular status: blood pressure returned to baseline and stable Postop Assessment: no apparent nausea or vomiting Anesthetic complications: no    Last Vitals:  Vitals:   03/07/20 1630 03/07/20 1645  BP: 97/64 95/63  Pulse: 74 67  Resp: 14 19  Temp:  36.9 C  SpO2: 100% 100%    Last Pain:  Vitals:   03/07/20 1645  TempSrc:   PainSc: 0-No pain                 [name]

## 2020-03-10 NOTE — Patient Instructions (Addendum)
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  You will be treated every 2 weeks and then we will switch to every 4 weeks for 1 year of treatment with an immunotherapy medication called nivolumab (Opdivo).  The intent of treatment is curative.  You will see the doctor regularly throughout treatment.  We will obtain blood work from you prior to every treatment and monitor your results to make sure it is safe to give you this drug. The doctor monitors your response to treatment by the way you are feeling, your blood work, and by obtaining scans periodically.  There will be wait times while you are here for treatment.  It will take about 30 minutes to 1 hour for your lab work to result.  Then there will be wait times while pharmacy mixes your medications.   Nivolumab (Opdivo)  About This Drug  Nivolumab is used to treat several different cancers.  It is given in the vein (IV).  It works by helping your immune system work properly to target and kill cancer cells.  It takes about 30 minutes to infuse.    These are some of the most common or important side effects to note:   Immune Reactions   This medication stimulates your immune system. Your immune system can attack normal organs and tissues in your body, leading to serious or life threatening complications. It is important to notify your healthcare provider right away if you develop any of the following symptoms:   Diarrhea / Intestinal problems (colitis, inflammation of the bowel): Abdominal pain, diarrhea, cramping, mucus or blood in the stool, dark or tar-like stools, fever. Diarrhea means different things to different people. Any increase in your normal bowel patterns can be defined as diarrhea and should be reported to your healthcare team.  Skin reactions: Report rash, with or without itching (pruritis), sores in your mouth, blistering or peeling skin, as these  can become severe and require treatment with corticosteroids.  Lung problems (pneumonitis, inflammation of the lung): New or worsening cough, shortness of breath, trouble breathing, or chest pain.  Liver problems (hepatitis, inflammation of the liver): Yellowing of the skin or eyes, your urine appears dark or brown, pain in your abdomen, bleeding or bruising more easily than normal, or severe nausea and vomiting.   Brain and/or nerve problems: Report any headache, drooping of eyelids, double vision, trouble swallowing, weakness of arms, legs or face, or numbness or tingling in the hands or feet to your healthcare team.  Hormone abnormalities: Immune reactions can affect the pituitary, thyroid, pancreas and adrenal glands, resulting in inflammation of these glands, which can affect their production of certain hormones. Some hormone levels can be monitored with blood work. It is important that you report any changes in how you are feeling to your care team. Symptoms of these hormonal changes can include: headaches, nausea, vomiting, constipation, rapid heart rate, increased sweating, extreme fatigue, weakness, changes in your voice, changes in memory and concentration, increased hunger or thirst, increased urination, weight gain, hair loss, dizziness, feeling cold all the time, and changes in mood or behavior (including irritability, forgetfulness and decreased sex drive).    Eye problems: Report any changes in vision, blurry or double vision, and eye pain or redness to your healthcare team.  Kidney problems (kidney inflammation or failure): Decreased urine output, blood in the urine, swelling in the ankles, loss of appetite.  Fatigue:  Fatigue  is very common during cancer treatment and is an overwhelming feeling of exhaustion that is not usually relieved by rest. While on cancer treatment, and for a period after, you may need to adjust your schedule to manage fatigue. Plan times to rest during the day  and conserve energy for more important activities. Exercise can help combat fatigue; a simple daily walk with a friend can help.  Talk to your healthcare team for helpful tips on dealing with this side effect.  Allergic Reactions:  In some cases, patients can have an allergic reaction to this medication. Signs of a reaction can include: shortness of breath or difficulty breathing, chest pain, rash, flushing or itching or a decrease in blood pressure. If you notice any changes in how you feel during the infusion, let your nurse know immediately. The infusion will be slowed or stopped if this occurs.   Less common, but important side effects can include:   Allogenic Stem Cell Transplant Reactions: Patients who receive this medication before or after having an allogenic stem cell transplant can be at an increased risk of graft vs. host disease, veno-occlusive disease and fever syndrome. You providers will monitor you closely for these side effects.   Reproductive Concerns  Exposure of an unborn child to this medication could cause birth defects, so you should not become pregnant or father a child while on this medication. Even if your menstrual cycle stops or you believe you are not producing sperm, effective birth control is necessary during treatment and for 5 months after stopping treatment. You should not breastfeed while taking this medication or for five months after the end of treatment.   Important Information  . This drug may be present in the saliva, tears, sweat, urine, stool, vomit, semen, and vaginal secretions. Talk to your doctor and/or your nurse about the necessary precautions to take during this time.  Treating Side Effects  . To decrease infection, wash your hands regularly  . Avoid close contact with people who have a cold, the flu, or other infections.  . Take your temperature as your doctor or nurse tells you, and whenever you feel like you may have a fever  . To help  decrease bleeding, use a soft toothbrush. Check with your nurse before using dental floss.  . Be very careful when using knives or tools  . Use an electric shaver instead of a razor  . Ask your doctor or nurse about medicines that are available to help stop or lessen constipation, diarrhea and/or nausea.  . Drink plenty of fluids (a minimum of eight glasses per day is recommended).  . If you are not able to move your bowels, check with your doctor or nurse before you use any enemas, laxatives, or suppositories  . To help with nausea and vomiting, eat small, frequent meals instead of three large meals a day. Choose foods and drinks that are at room temperature. Ask your nurse or doctor about other helpful tips and medicine that is available to help or stop lessen these symptoms.  . If you get diarrhea, eat low-fiber foods that are high in protein and calories and avoid foods that can irritate your digestive tracts or lead to cramping. Ask your nurse or doctor about medicine that can lessen or stop your diarrhea.  . To help with decreased appetite, eat small, frequent meals  . Eat high caloric food such as pudding, ice cream, yogurt and milkshakes.  . Manage tiredness by pacing your activities for the  day. Be sure to include periods of rest between energy-draining activities  . Keeping your pain under control is important to your wellbeing. Please tell your doctor or nurse if you are experiencing pain.  . If you have diabetes, keep good control of your blood sugar level. Tell your nurse or your doctor if your glucose levels are higher or lower than normal  . If you get a rash do not put anything on it unless your doctor or nurse says you may. Keep the area around the rash clean and dry. Ask your doctor for medicine if your rash bothers you.  . Infusion reactions may occur after your infusion. If this happens, call 911 for emergency care.  Food and Drug Interactions  . There are no known  interactions of nivolumab with food or other medications.  . Tell your doctor and pharmacist about all the medicines and dietary supplements (vitamins, minerals, herbs and others) that you are taking at this time. The safety and use of dietary supplements and alternative diets are often not known. Using these might affect your cancer or interfere with your treatment. Until more is known, you should not use dietary supplements or alternative diets without your cancer doctor's help.   When to Call the Doctor  Call your doctor or nurse if you have any of these symptoms and/or any new or unusual symptoms:  . Fever of 100.5 F (38 C) or higher  . Chills  . Easy bleeding or bruising  . Wheezing or trouble breathing  . Dry cough, or cough with yellow, green or bloody mucus  . Confusion and/or agitation  . Hallucinations  . Trouble understanding or speaking  . Blurry vision or changes in your eyesight  . Numbness or lack of strength to your arms, legs, face, or body  . Feeling dizzy or lightheaded  . Loose bowel movements (diarrhea) more than 4 times a day or diarrhea with weakness or lightheadedness  . Nausea that stops you from eating or drinking, and/or that is not relieved by prescribed medicines  . Lasting loss of appetite or rapid weight loss of five pounds in a week  . No bowel movement for 3 days or you feel uncomfortable  . Bad abdominal pain, especially in upper right area  . Fatigue or extreme weakness that interferes with normal activities  . Decreased urine  . Unusual thirst or passing urine often  . Rash that is not relieved by prescribed medicines  . Rash or itching  . Flu-like symptoms: fever, headache, muscle and joint aches, and fatigue (low energy, feeling weak)  . Signs of liver problems: dark urine, pale bowel movements, bad stomach pain, feeling very tired and weak, unusual itching, or yellowing of the eyes or skin.  . Signs of infusion reaction:  fever or shaking chills, flushing, facial swelling, feeling dizzy, headache, trouble breathing, rash, itching, chest tightness, or chest pain.   SELF CARE ACTIVITIES WHILE ON IMMUNOTHERAPY:  Hydration Increase your fluid intake 48 hours prior to treatment and drink at least 8 to 12 cups (64 ounces) of water/decaffeinated beverages per day after treatment. You can still have your cup of coffee or soda but these beverages do not count as part of your 8 to 12 cups that you need to drink daily. No alcohol intake.  Medications Continue taking your normal prescription medication as prescribed.  If you start any new herbal or new supplements please let us know first to make sure it is safe.  Infection  Prevention Please wash your hands for at least 30 seconds using warm soapy water. Handwashing is the #1 way to prevent the spread of germs. Stay away from sick people or people who are getting over a cold. If you develop respiratory systems such as green/yellow mucus production or productive cough or persistent cough let us know and we will see if you need an antibiotic. It is a good idea to keep a pair of gloves on when going into grocery stores/[employer] to decrease your risk of coming into contact with germs on the carts, etc. Carry alcohol hand gel with you at all times and use it frequently if out in public. If your temperature reaches 100.5 or higher please call the clinic and let us know.  If it is after hours or on the weekend please go to the ER if your temperature is over 100.4.  Please have your own personal thermometer at home to use.    Sex and bodily fluids If you are going to have sex, a condom must be used to protect the person that isn't taking immunotherapy. For a few days after treatment, immunotherapy can be excreted through your bodily fluids.  When using the toilet please close the lid and flush the toilet twice.  Do this for a few day after you have had immunotherapy.   Contraception It  is not known for sure whether or not immunotherapy drugs can be passed on through semen or secretions from the vagina. Because of this some doctors advise people to use a barrier method if you have sex during treatment. This applies to vaginal, anal or oral sex.  Generally, doctors advise a barrier method only for the time you are actually having the treatment and for about a week after your treatment.  Advice like this can be worrying, but this does not mean that you have to avoid being intimate with your partner. You can still have close contact with your partner and continue to enjoy sex.  Animals If you have cats or birds we just ask that you not change the litter or change the cage.  Please have someone else do this for you while you are on immunotherapy.   Food Safety During and After Cancer Treatment Food safety is important for people both during and after cancer treatment. Cancer and cancer treatments, such as chemotherapy, radiation therapy, and stem cell/bone marrow transplantation, often weaken the immune system. This makes it harder for your body to protect itself from foodborne illness, also called food poisoning. Foodborne illness is caused by eating food that contains harmful bacteria, parasites, or viruses.  Foods to avoid Some foods have a higher risk of becoming tainted with bacteria. These include: Marland Kitchen Unwashed fresh fruit and vegetables, especially leafy vegetables that can hide dirt and other contaminants . Raw sprouts, such as alfalfa sprouts . Raw or undercooked beef, especially ground beef, or other raw or undercooked meat and poultry . Fatty, fried, or spicy foods immediately before or after treatment.  These can sit heavy on your stomach and make you feel nauseous. . Raw or undercooked shellfish, such as oysters. . Sushi and sashimi, which often contain raw fish.  . Unpasteurized beverages, such as unpasteurized fruit juices, raw milk, raw yogurt, or cider . Undercooked  eggs, such as soft boiled, over easy, and poached; raw, unpasteurized eggs; or foods made with raw egg, such as homemade raw cookie dough and homemade mayonnaise  Simple steps for food safety  Shop smart. . Do  not buy food stored or displayed in an unclean area. . Do not buy bruised or damaged fruits or vegetables. . Do not buy cans that have cracks, dents, or bulges. . Pick up foods that can spoil at the end of your shopping trip and store them in a cooler on the way home.  Prepare and clean up foods carefully. . Rinse all fresh fruits and vegetables under running water, and dry them with a clean towel or paper towel. . Clean the top of cans before opening them. . After preparing food, wash your hands for 20 seconds with hot water and soap. Pay special attention to areas between fingers and under nails. . Clean your utensils and dishes with hot water and soap. Marland Kitchen Disinfect your kitchen and cutting boards using 1 teaspoon of liquid, unscented bleach mixed into 1 quart of water.    Dispose of old food. . Eat canned and packaged food before its expiration date (the "use by" or "best before" date). . Consume refrigerated leftovers within 3 to 4 days. After that time, throw out the food. Even if the food does not smell or look spoiled, it still may be unsafe. Some bacteria, such as Listeria, can grow even on foods stored in the refrigerator if they are kept for too long.  Take precautions when eating out. . At restaurants, avoid buffets and salad bars where food sits out for a long time and comes in contact with many people. Food can become contaminated when someone with a virus, often a norovirus, or another "bug" handles it. . Put any leftover food in a "to-go" container yourself, rather than having the server do it. And, refrigerate leftovers as soon as you get home. . Choose restaurants that are clean and that are willing to prepare your food as you order it cooked.    Wear comfortable  clothing and clothing appropriate for easy access to any Portacath or PICC line. Let us know if there is anything that we can do to make your therapy better!   What to do if you need assistance after hours or on the weekends: CALL [phone].  HOLD on the line, do not hang up.  You will hear multiple messages but at the end you will be connected with a nurse triage line.  They will contact the doctor if necessary.  Most of the time they will be able to assist you.  Do not call the hospital operator.    I have been informed and understand all of the instructions given to me and have received a copy. I have been instructed to call the clinic [phone] or my family physician as soon as possible for continued medical care, if indicated. I do not have any more questions at this time but understand that I may call the [facility] or the Patient Navigator at [phone] during office hours should I have questions or need assistance in obtaining follow-up care.

## 2020-03-11 ENCOUNTER — Inpatient Hospital Stay (HOSPITAL_BASED_OUTPATIENT_CLINIC_OR_DEPARTMENT_OTHER): Payer: Medicaid Other | Admitting: Hematology

## 2020-03-11 ENCOUNTER — Inpatient Hospital Stay (HOSPITAL_COMMUNITY): Payer: Medicaid Other | Attending: Hematology

## 2020-03-11 ENCOUNTER — Inpatient Hospital Stay (HOSPITAL_COMMUNITY): Payer: Medicaid Other

## 2020-03-11 ENCOUNTER — Other Ambulatory Visit: Payer: Self-pay

## 2020-03-11 ENCOUNTER — Encounter (HOSPITAL_COMMUNITY): Payer: Self-pay | Admitting: Hematology

## 2020-03-11 DIAGNOSIS — C4371 Malignant melanoma of right lower limb, including hip: Secondary | ICD-10-CM | POA: Diagnosis present

## 2020-03-11 DIAGNOSIS — Z5111 Encounter for antineoplastic chemotherapy: Secondary | ICD-10-CM | POA: Insufficient documentation

## 2020-03-11 DIAGNOSIS — C439 Malignant melanoma of skin, unspecified: Secondary | ICD-10-CM | POA: Diagnosis not present

## 2020-03-11 DIAGNOSIS — E041 Nontoxic single thyroid nodule: Secondary | ICD-10-CM | POA: Insufficient documentation

## 2020-03-11 DIAGNOSIS — D509 Iron deficiency anemia, unspecified: Secondary | ICD-10-CM | POA: Diagnosis not present

## 2020-03-11 DIAGNOSIS — Z79899 Other long term (current) drug therapy: Secondary | ICD-10-CM | POA: Insufficient documentation

## 2020-03-11 LAB — CBC WITH DIFFERENTIAL/PLATELET
Abs Immature Granulocytes: 0.02 10*3/uL (ref 0.00–0.07)
Basophils Absolute: 0 10*3/uL (ref 0.0–0.1)
Basophils Relative: 1 %
Eosinophils Absolute: 0.2 10*3/uL (ref 0.0–0.5)
Eosinophils Relative: 2 %
HCT: 33 % — ABNORMAL LOW (ref 36.0–46.0)
Hemoglobin: 9.4 g/dL — ABNORMAL LOW (ref 12.0–15.0)
Immature Granulocytes: 0 %
Lymphocytes Relative: 23 %
Lymphs Abs: 1.8 10*3/uL (ref 0.7–4.0)
MCH: 21.9 pg — ABNORMAL LOW (ref 26.0–34.0)
MCHC: 28.5 g/dL — ABNORMAL LOW (ref 30.0–36.0)
MCV: 76.9 fL — ABNORMAL LOW (ref 80.0–100.0)
Monocytes Absolute: 0.4 10*3/uL (ref 0.1–1.0)
Monocytes Relative: 5 %
Neutro Abs: 5.3 10*3/uL (ref 1.7–7.7)
Neutrophils Relative %: 69 %
Platelets: 277 10*3/uL (ref 150–400)
RBC: 4.29 MIL/uL (ref 3.87–5.11)
RDW: 19.8 % — ABNORMAL HIGH (ref 11.5–15.5)
WBC: 7.7 10*3/uL (ref 4.0–10.5)
nRBC: 0 % (ref 0.0–0.2)

## 2020-03-11 LAB — MAGNESIUM: Magnesium: 2 mg/dL (ref 1.7–2.4)

## 2020-03-11 LAB — COMPREHENSIVE METABOLIC PANEL
ALT: 17 U/L (ref 0–44)
AST: 25 U/L (ref 15–41)
Albumin: 4 g/dL (ref 3.5–5.0)
Alkaline Phosphatase: 78 U/L (ref 38–126)
Anion gap: 8 (ref 5–15)
BUN: 11 mg/dL (ref 6–20)
CO2: 24 mmol/L (ref 22–32)
Calcium: 9.6 mg/dL (ref 8.9–10.3)
Chloride: 104 mmol/L (ref 98–111)
Creatinine, Ser: 0.59 mg/dL (ref 0.44–1.00)
GFR calc Af Amer: >60 mL/min (ref >60)
GFR calc non Af Amer: >60 mL/min (ref >60)
Glucose, Bld: 82 mg/dL (ref 70–99)
Potassium: 3.7 mmol/L (ref 3.5–5.1)
Sodium: 136 mmol/L (ref 135–145)
Total Bilirubin: 0.3 mg/dL (ref 0.3–1.2)
Total Protein: 7.4 g/dL (ref 6.5–8.1)

## 2020-03-11 NOTE — Progress Notes (Signed)
[id number] Labs reviewed with and pt seen by Dr. Delton Coombes and pt will not be receiving any treatment today per MD

## 2020-03-11 NOTE — Progress Notes (Signed)
[place], [place] [address]   CLINIC:  Medical Oncology/Hematology  PCP:  Patient, No Pcp Per No address on file None   REASON FOR VISIT:  Follow-up for malignant melanoma.  CURRENT THERAPY: Adjuvant pembrolizumab.  BRIEF ONCOLOGIC HISTORY:  Oncology History  Melanoma of skin ([facility])  12/11/2019 Initial Diagnosis   Melanoma of skin ([facility])   02/19/2020 Genetic Testing   BRAF Mutation Analysis     03/25/2020 -  Chemotherapy   The patient had pembrolizumab (KEYTRUDA) 200 mg in sodium chloride 0.9 % 50 mL chemo infusion, 200 mg, Intravenous, Once, 0 of 6 cycles  for chemotherapy treatment.       CANCER STAGING: Cancer Staging Melanoma of skin ([facility]) Staging form: Melanoma of the Skin, AJCC 8th Edition - Clinical stage from 02/08/2020: Stage III (cT2b, cN2a, cM0) - Unsigned    INTERVAL HISTORY:  Ms. Deatley 44 y.o. female seen for follow-up of malignant melanoma of the right third toe.  She had right third toe amputation at the PIP level on 03/07/2020.  She also had port placed.  Reported some shortness of breath on exertion.  Energy levels are 50%.  Appetite levels are low.  Also reports some constipation and occasional nausea.  Denies any fevers or chills.  Chronic headaches are stable.    REVIEW OF SYSTEMS:  Review of Systems  Respiratory: Positive for shortness of breath.   Gastrointestinal: Positive for constipation and nausea.  Neurological: Positive for headaches.  Psychiatric/Behavioral: Positive for depression.  All other systems reviewed and are negative.    PAST MEDICAL/SURGICAL HISTORY:  Past Medical History:  Diagnosis Date  . Anemia   . Anxiety   . Depression   . GERD (gastroesophageal reflux disease)   . Headache   . Shingles    Past Surgical History:  Procedure Laterality Date  . AMPUTATION TOE Right 03/07/2020   Procedure: RIGHT THIRD TOE AMPUTATION;  Surgeon: [name], MD;  Location: [facility];  Service:  General;  Laterality: Right;  . BREAST LUMPECTOMY  age 36  . CESAREAN SECTION  01/06/2006  . CESAREAN SECTION  10/15/2016  . DENTAL SURGERY  2019  . INGUINAL HERNIA REPAIR Right 01/30/2020   Procedure: RIGHT INGUINAL HERNIA REPAIR WITH  MESH;  Surgeon: [name], MD;  Location: [facility];  Service: General;  Laterality: Right;  . INSERTION OF MESH Right 01/30/2020   Procedure: INSERTION OF MESH;  Surgeon: [name], MD;  Location: [facility];  Service: General;  Laterality: Right;  . IRRIGATION AND DEBRIDEMENT ABSCESS Right 03/07/2020   Procedure: Aspiration of Right Groin Seroma;  Surgeon: [name], MD;  Location: [facility];  Service: General;  Laterality: Right;  . MELANOMA EXCISION WITH SENTINEL LYMPH NODE BIOPSY Right 01/30/2020   Procedure: WIDE LOWER EXCISION RIGHT THIRD TOE MELANOMA EXCISION WITH SENTINEL LYMPH NODE BIOPSY;  Surgeon: [name], MD;  Location: [facility];  Service: General;  Laterality: Right;  . PORTACATH PLACEMENT N/A 03/07/2020   Procedure: INSERTION PORT-A-CATH;  Surgeon: [name], MD;  Location: [facility];  Service: General;  Laterality: N/A;     SOCIAL HISTORY:  Social History   Socioeconomic History  . Marital status: Legally Separated    Spouse name: Not on file  . Number of children: 2  . Years of education: Not on file  . Highest education level: Not on file  Occupational History  . Occupation: unemployed d/t COVID  Tobacco Use  .  Smoking status: Current Every Day Smoker    Packs/day: 1.00  . Smokeless tobacco: Never Used  Substance and Sexual Activity  . Alcohol use: No  . Drug use: Yes    Types: Marijuana  . Sexual activity: Yes  Other Topics Concern  . Not on file  Social History Narrative  . Not on file   Social Determinants of Health   Financial Resource Strain: High Risk  . Difficulty of Paying Living Expenses: Hard  Food Insecurity: No Food Insecurity  . Worried About Paediatric nurse in the Last Year: Never true  . Ran Out of Food in the Last Year: Never true  Transportation Needs: No Transportation Needs  . Lack of Transportation (Medical): No  . Lack of Transportation (Non-Medical): No  Physical Activity: Inactive  . Days of Exercise per Week: 0 days  . Minutes of Exercise per Session: 0 min  Stress: Stress Concern Present  . Feeling of Stress : Very much  Social Connections: Moderately Isolated  . Frequency of Communication with Friends and Family: Twice a week  . Frequency of Social Gatherings with Friends and Family: Never  . Attends Religious Services: 1 to 4 times per year  . Active Member of Clubs or Organizations: No  . Attends Archivist Meetings: Never  . Marital Status: Separated  Intimate Partner Violence: Not At Risk  . Fear of Current or Ex-Partner: No  . Emotionally Abused: No  . Physically Abused: No  . Sexually Abused: No    FAMILY HISTORY:  Family History  Problem Relation Age of Onset  . Heart disease Mother   . Cancer Mother   . Ovarian cysts Sister   . Healthy Brother   . Cancer Maternal Grandmother   . Heart attack Maternal Grandmother   . Cancer Maternal Grandfather   . Cancer Paternal Grandmother   . Healthy Sister   . Healthy Sister   . Psoriasis Daughter   . Healthy Son     CURRENT MEDICATIONS:  Outpatient Encounter Medications as of 03/11/2020  Medication Sig  . scopolamine (TRANSDERM-SCOP) 1 MG/3DAYS Place 1 patch onto the skin every 3 (three) days.  Marland Kitchen NIVOLUMAB IV Inject into the vein.  Marland Kitchen oxyCODONE (OXY IR/ROXICODONE) 5 MG immediate release tablet Take 1 tablet (5 mg total) by mouth every 6 (six) hours as needed for severe pain.   No facility-administered encounter medications on file as of 03/11/2020.    ALLERGIES:  No Known Allergies   PHYSICAL EXAM:  ECOG Performance status: 0  Vitals:   03/11/20 1320  BP: 111/83  Pulse: 100  Resp: 16  Temp: 98.2 F (36.8 C)  SpO2: 100%   Filed  Weights   03/11/20 1320  Weight: 106 lb 3.2 oz (48.2 kg)    Physical Exam Vitals reviewed.  Constitutional:      Appearance: Normal appearance.  Skin:    General: Skin is warm.  Neurological:     General: No focal deficit present.     Mental Status: She is alert and oriented to person, place, and time.  Psychiatric:        Mood and Affect: Mood normal.        Behavior: Behavior normal.    There is a drain in the right groin region.  LABORATORY DATA:  I have reviewed the labs as listed.  CBC    Component Value Date/Time   WBC 7.7 03/11/2020 1303   RBC 4.29 03/11/2020 1303   HGB 9.4 (  L) 03/11/2020 1303   HGB 12.5 08/29/2007 1545   HCT 33.0 (L) 03/11/2020 1303   HCT 37.0 08/29/2007 1545   PLT 277 03/11/2020 1303   PLT 277 08/29/2007 1545   MCV 76.9 (L) 03/11/2020 1303   MCV 84.5 08/29/2007 1545   MCH 21.9 (L) 03/11/2020 1303   MCHC 28.5 (L) 03/11/2020 1303   RDW 19.8 (H) 03/11/2020 1303   RDW 16.2 (H) 08/29/2007 1545   LYMPHSABS 1.8 03/11/2020 1303   LYMPHSABS 2.3 08/29/2007 1545   MONOABS 0.4 03/11/2020 1303   MONOABS 0.3 08/29/2007 1545   EOSABS 0.2 03/11/2020 1303   EOSABS 0.2 08/29/2007 1545   BASOSABS 0.0 03/11/2020 1303   BASOSABS 0.0 08/29/2007 1545   CMP Latest Ref Rng & Units 03/11/2020 03/07/2020 12/11/2019  Glucose 70 - 99 mg/dL 82 94 92  BUN 6 - 20 mg/dL 11 7 9   Creatinine 0.44 - 1.00 mg/dL 0.59 0.69 0.64  Sodium 135 - 145 mmol/L 136 139 137  Potassium 3.5 - 5.1 mmol/L 3.7 3.9 4.5  Chloride 98 - 111 mmol/L 104 111 106  CO2 22 - 32 mmol/L 24 20(L) 27  Calcium 8.9 - 10.3 mg/dL 9.6 9.4 9.7  Total Protein 6.5 - 8.1 g/dL 7.4 6.9 7.7  Total Bilirubin 0.3 - 1.2 mg/dL 0.3 0.4 0.5  Alkaline Phos 38 - 126 U/L 78 79 69  AST 15 - 41 U/L 25 26 23   ALT 0 - 44 U/L 17 17 16        DIAGNOSTIC IMAGING:  I have reviewed scans.     ASSESSMENT & PLAN:   Melanoma of skin ([facility]) 1.  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 -Resection of the primary with sentinel lymph node biopsy on 01/30/2020, positive margin, pathology with 3 lymph nodes positive for micrometastasis. -She underwent right third toe amputation at PIP level on 03/07/2020 with port placement.  Pathology report is pending. -We talked about adjuvant chemotherapy with 1 year of pembrolizumab. -We talked about the immunotherapy related side effects including rare chance of colitis, pneumonitis, dermatitis, hypophysitis.  She does not have any autoimmune disorders. -Tentatively we will plan to start on the week of 03/25/2020.  She already had port placed. -We will also schedule her for CT of the chest and abdomen and will repeated every 3 months while she is receiving adjuvant therapy.  2.  Right thyroid nodule: -PET scan on 12/20/2019 showed uptake in the right thyroid.  Ultrasound on 02/05/2020 did not show any suspicious nodules.  3.  Microcytic anemia: -Labs today shows hemoglobin 9.4 with MCV of 76.  She is taking iron tablet daily. -I plan to check her ferritin and iron panel in 2 weeks.  4.  Family history: -Mother died of throat cancer.  Maternal grandmother had breast cancer.  Maternal aunt had pancreatic cancer.  Maternal grandfather had bone cancer.  Maternal great grandmother died of melanoma. -Given her extensive family history, she will benefit from genetic testing.      Orders placed this encounter:  No orders of the defined types were placed in this encounter.  Total time spent is 30 minutes with more than 50% of the time spent face-to-face discussing treatment plan, side effects and counseling and coordination of care.   Derek Jack, MD [place] [phone]

## 2020-03-11 NOTE — Patient Instructions (Addendum)
[place] at [facility] Discharge Instructions  You were seen today by Dr. Delton Coombes. He talked with you about your new treatment.  He will start treatments in about 2 weeks to allow your foot time to heal.  He will give it every 2 weeks for a few treatments and then we will switch to every 4 weeks.  You will receive treatment for 1 year.  We are giving you treatment for the intention of cure.   We will do scans every 3 months.  This will show [place] that the treatment is working as long as there is nothing showing new in the scans.  We will continue as long as they are negative.   We will see you back in 2 weeks for labs and your first treatment.    Thank you for choosing [facility] at [facility] to provide your oncology and hematology care.  To afford each patient quality time with our provider, please arrive at least 15 minutes before your scheduled appointment time.   If you have a lab appointment with the [facility] please come in thru the  Main Entrance and check in at the main information desk  You need to re-schedule your appointment should you arrive 10 or more minutes late.  We strive to give you quality time with our providers, and arriving late affects you and other patients whose appointments are after yours.  Also, if you no show three or more times for appointments you may be dismissed from the clinic at the providers discretion.     Again, thank you for choosing [facility].  Our hope is that these requests will decrease the amount of time that you wait before being seen by our physicians.       _____________________________________________________________  Should you have questions after your visit to [facility], please contact our office at [phone] between the hours of 8:00 a.m. and 4:30 p.m.  Voicemails left after 4:00 p.m. will not be returned until the following business day.  For prescription  refill requests, have your pharmacy contact our office and allow 72 hours.    Cancer Center Support Programs:   > Cancer Support Group  2nd Tuesday of the month 1pm-2pm, Journey Room

## 2020-03-11 NOTE — Progress Notes (Signed)
START ON PATHWAY REGIMEN - Melanoma and Other Skin Cancers     A cycle is 21 days:     Pembrolizumab   **Always confirm dose/schedule in your pharmacy ordering system**  Patient Characteristics: Melanoma, Cutaneous/Unknown Primary, Postoperative without Neoadjuvant Therapy (Pathologic Staging), Any pT, pN+, BRAF V600 Wild Type / BRAF V600 Results Pending or Unknown Disease Classification: Melanoma Disease Subtype: Cutaneous BRAF V600 Mutation Status: BRAF V600 Wild Type (No Mutation) Therapeutic Status: Postoperative without Neoadjuvant Therapy (Pathologic Staging) AJCC T Category: pT2b AJCC N Category: pN2a AJCC M Category: cM0 AJCC 8 Stage Grouping: IIIB Intent of Therapy: Curative Intent, Discussed with Patient

## 2020-03-11 NOTE — Assessment & Plan Note (Signed)
1.  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 -Resection of the primary with sentinel lymph node biopsy on 01/30/2020, positive margin, pathology with 3 lymph nodes positive for micrometastasis. -She underwent right third toe amputation at PIP level on 03/07/2020 with port placement.  Pathology report is pending. -We talked about adjuvant chemotherapy with 1 year of pembrolizumab. -We talked about the immunotherapy related side effects including rare chance of colitis, pneumonitis, dermatitis, hypophysitis.  She does not have any autoimmune disorders. -Tentatively we will plan to start on the week of 03/25/2020.  She already had port placed. -We will also schedule her for CT of the chest and abdomen and will repeated every 3 months while she is receiving adjuvant therapy.  2.  Right thyroid nodule: -PET scan on 12/20/2019 showed uptake in the right thyroid.  Ultrasound on 02/05/2020 did not show any suspicious nodules.  3.  Microcytic anemia: -Labs today shows hemoglobin 9.4 with MCV of 76.  She is taking iron tablet daily. -I plan to check her ferritin and iron panel in 2 weeks.  4.  Family history: -Mother died of throat cancer.  Maternal grandmother had breast cancer.  Maternal aunt had pancreatic cancer.  Maternal grandfather had bone cancer.  Maternal great grandmother died of melanoma. -Given her extensive family history, she will benefit from genetic testing.

## 2020-03-12 ENCOUNTER — Other Ambulatory Visit (HOSPITAL_COMMUNITY): Payer: Self-pay | Admitting: *Deleted

## 2020-03-12 ENCOUNTER — Other Ambulatory Visit: Payer: Self-pay

## 2020-03-12 DIAGNOSIS — C439 Malignant melanoma of skin, unspecified: Secondary | ICD-10-CM

## 2020-03-12 DIAGNOSIS — C799 Secondary malignant neoplasm of unspecified site: Secondary | ICD-10-CM

## 2020-03-13 LAB — SURGICAL PATHOLOGY

## 2020-03-21 ENCOUNTER — Other Ambulatory Visit (HOSPITAL_COMMUNITY): Payer: Self-pay | Admitting: *Deleted

## 2020-03-21 ENCOUNTER — Ambulatory Visit (HOSPITAL_COMMUNITY)
Admission: RE | Admit: 2020-03-21 | Discharge: 2020-03-21 | Disposition: A | Discharge status: Home / Self Care | Payer: Medicaid Other | Source: Ambulatory Visit | Attending: Hematology | Admitting: Hematology

## 2020-03-21 ENCOUNTER — Other Ambulatory Visit: Payer: Self-pay

## 2020-03-21 DIAGNOSIS — N200 Calculus of kidney: Secondary | ICD-10-CM | POA: Diagnosis not present

## 2020-03-21 DIAGNOSIS — I700 Atherosclerosis of aorta: Secondary | ICD-10-CM | POA: Diagnosis not present

## 2020-03-21 DIAGNOSIS — C799 Secondary malignant neoplasm of unspecified site: Secondary | ICD-10-CM | POA: Diagnosis not present

## 2020-03-21 DIAGNOSIS — C439 Malignant melanoma of skin, unspecified: Secondary | ICD-10-CM

## 2020-03-21 MED ORDER — ONDANSETRON HCL 4 MG PO TABS: 4.0000 mg | ORAL_TABLET | Freq: Three times a day (TID) | ORAL | 1 refills | Status: DC | PRN

## 2020-03-21 MED ORDER — IOHEXOL 300 MG/ML  SOLN
100.0000 mL | Freq: Once | INTRAMUSCULAR | Status: AC | PRN
  Administered 2020-03-21: 100 mL via INTRAVENOUS

## 2020-03-25 ENCOUNTER — Inpatient Hospital Stay (HOSPITAL_COMMUNITY): Payer: Medicaid Other

## 2020-03-25 ENCOUNTER — Other Ambulatory Visit: Payer: Self-pay

## 2020-03-25 ENCOUNTER — Inpatient Hospital Stay (HOSPITAL_BASED_OUTPATIENT_CLINIC_OR_DEPARTMENT_OTHER): Payer: Medicaid Other | Admitting: Hematology

## 2020-03-25 VITALS — BP 98/60 | HR 76 | Temp 97.1°F | Resp 18

## 2020-03-25 DIAGNOSIS — C439 Malignant melanoma of skin, unspecified: Secondary | ICD-10-CM | POA: Diagnosis not present

## 2020-03-25 DIAGNOSIS — D509 Iron deficiency anemia, unspecified: Secondary | ICD-10-CM | POA: Diagnosis not present

## 2020-03-25 DIAGNOSIS — C4371 Malignant melanoma of right lower limb, including hip: Secondary | ICD-10-CM

## 2020-03-25 DIAGNOSIS — Z5111 Encounter for antineoplastic chemotherapy: Secondary | ICD-10-CM | POA: Diagnosis not present

## 2020-03-25 LAB — COMPREHENSIVE METABOLIC PANEL
ALT: 15 U/L (ref 0–44)
AST: 25 U/L (ref 15–41)
Albumin: 3.6 g/dL (ref 3.5–5.0)
Alkaline Phosphatase: 73 U/L (ref 38–126)
Anion gap: 9 (ref 5–15)
BUN: 11 mg/dL (ref 6–20)
CO2: 20 mmol/L — ABNORMAL LOW (ref 22–32)
Calcium: 9.1 mg/dL (ref 8.9–10.3)
Chloride: 106 mmol/L (ref 98–111)
Creatinine, Ser: 0.53 mg/dL (ref 0.44–1.00)
GFR calc Af Amer: >60 mL/min (ref >60)
GFR calc non Af Amer: >60 mL/min (ref >60)
Glucose, Bld: 109 mg/dL — ABNORMAL HIGH (ref 70–99)
Potassium: 3.8 mmol/L (ref 3.5–5.1)
Sodium: 135 mmol/L (ref 135–145)
Total Bilirubin: 0.2 mg/dL — ABNORMAL LOW (ref 0.3–1.2)
Total Protein: 7.1 g/dL (ref 6.5–8.1)

## 2020-03-25 LAB — MAGNESIUM: Magnesium: 2.1 mg/dL (ref 1.7–2.4)

## 2020-03-25 LAB — CBC WITH DIFFERENTIAL/PLATELET
Abs Immature Granulocytes: 0.01 10*3/uL (ref 0.00–0.07)
Basophils Absolute: 0 10*3/uL (ref 0.0–0.1)
Basophils Relative: 0 %
Eosinophils Absolute: 0.1 10*3/uL (ref 0.0–0.5)
Eosinophils Relative: 3 %
HCT: 30.3 % — ABNORMAL LOW (ref 36.0–46.0)
Hemoglobin: 8.8 g/dL — ABNORMAL LOW (ref 12.0–15.0)
Immature Granulocytes: 0 %
Lymphocytes Relative: 33 %
Lymphs Abs: 1.5 10*3/uL (ref 0.7–4.0)
MCH: 22.2 pg — ABNORMAL LOW (ref 26.0–34.0)
MCHC: 29 g/dL — ABNORMAL LOW (ref 30.0–36.0)
MCV: 76.5 fL — ABNORMAL LOW (ref 80.0–100.0)
Monocytes Absolute: 0.3 10*3/uL (ref 0.1–1.0)
Monocytes Relative: 6 %
Neutro Abs: 2.5 10*3/uL (ref 1.7–7.7)
Neutrophils Relative %: 58 %
Platelets: 301 10*3/uL (ref 150–400)
RBC: 3.96 MIL/uL (ref 3.87–5.11)
RDW: 19.6 % — ABNORMAL HIGH (ref 11.5–15.5)
WBC: 4.5 10*3/uL (ref 4.0–10.5)
nRBC: 0 % (ref 0.0–0.2)

## 2020-03-25 LAB — TSH: TSH: 2.939 u[IU]/mL (ref 0.350–4.500)

## 2020-03-25 MED ORDER — SODIUM CHLORIDE 0.9 % IV SOLN
200.0000 mg | Freq: Once | INTRAVENOUS | Status: AC
  Administered 2020-03-25: 200 mg via INTRAVENOUS
  Filled 2020-03-25: qty 8

## 2020-03-25 MED ORDER — HEPARIN SOD (PORK) LOCK FLUSH 100 UNIT/ML IV SOLN
500.0000 [IU] | Freq: Once | INTRAVENOUS | Status: AC | PRN
  Administered 2020-03-25: 500 [IU]

## 2020-03-25 MED ORDER — SODIUM CHLORIDE 0.9% FLUSH
10.0000 mL | INTRAVENOUS | Status: DC | PRN
  Administered 2020-03-25: 10 mL

## 2020-03-25 MED ORDER — SODIUM CHLORIDE 0.9 % IV SOLN: Freq: Once | INTRAVENOUS | Status: AC

## 2020-03-25 NOTE — Patient Instructions (Addendum)
Pembrolizumab Beryle Flock)   About This Drug Pembrolizumab is used to treat cancer. It is given in the vein (IV).  It takes 30 minutes to infuse.  Possible Side Effects  . Nausea . Diarrhea (loose bowel movements) . Constipation (not able to move bowels) . Pain in your abdomen . Tiredness . Fever . Decreased appetite (decreased hunger) . Muscle and bone pain . Trouble breathing . Cough . Rash . Itching  Note: Each of the side effects above was reported in 20% or greater of patients treated with pembrolizumab. Your side effects may be different if you are receiving pembrolizumab in combination with another chemotherapy agent. Not all possible side effects are included above.  Warnings and Precautions  . This drug works with your immune system and can cause inflammation in any of your organs and tissues and can change how they work. This may put you at risk for developing serious medical problems, which can be life-threatening.  . Inflammation in the colon (colitis), which can be life-threatening - symptoms are loose bowel movements (diarrhea) stomach cramping, and sometimes blood in the bowel movements  . Changes in liver function  . Changes in kidney function  . Inflammation (swelling) of the lungs, which can be life-threatening. You may have a dry cough or trouble breathing.  . This drug may affect some of your hormone glands (especially the thyroid, adrenals, pituitary and pancreas).  . Blood sugar levels may change, and you may develop diabetes. If you already have diabetes, changes may need to be made to your diabetes medication.  . Severe allergic skin reaction, which can be life-threatening. You may develop blisters on your skin that are filled with fluid or a severe red rash all over your body that may be painful.  . Increased risk of organ rejection in patients who have received donor organs  . Increased risk of complications in patients who will undergo a stem cell  transplant after receiving pembrolizumab.  . While you are getting this drug in your vein (IV), you may have a reaction to the drug. Your nurse ill check you closely for these signs: fever or shaking chills, flushing, facial swelling, feeling dizzy, headache, trouble breathing, rash, itching, chest tightness, or chest pain. These reactions may occur after your infusion. If this happens, call 911 for emergency care.  Note: Some of the side effects above are very rare. If you have concerns and/or questions, please discuss them with your medical team.  Important Information . This drug may be present in the saliva, tears, sweat, urine, stool, vomit, semen, and vaginal secretions. Talk to your doctor and/or your nurse about the necessary precautions to take during this time.  Treating Side Effects  . Drink plenty of fluids (a minimum of eight glasses per day is recommended).  . If you throw up or have loose bowel movements, you should drink more fluids so that you do not become dehydrated (lack of water in the body from losing too much fluid).  . To help with nausea and vomiting, eat small, frequent meals instead of three large meals a day. Choose foods and drinks that are at room temperature. Ask your nurse or doctor about other helpful tips and medicine that is available to help stop or lessen these symptoms.  . If you have diarrhea, eat low-fiber foods that are high in protein and calories and avoid foods that can irritate your digestive tracts or lead to cramping.  . If you are not able to move your  bowels, check with your doctor or nurse before you use any enemas, laxatives, or suppositories.  . Ask your doctor or nurse about medicines that are available to help stop or lessen constipation or diarrhea.  . To help with decreased appetite, eat small, frequent meals. Eat foods high in calories and protein, such as meat, poultry, fish, dry beans, tofu, eggs, nuts, milk, yogurt, cheese, ice cream,  pudding, and nutritional supplements.  . Consider using sauces and spices to increase taste. Daily exercise, with your doctor's approval, may increase your appetite.  . Manage tiredness by pacing your activities for the day. Be sure to include periods of rest between energy-draining activities.  Marland Kitchen Keeping your pain under control is important to your wellbeing. Please tell your doctor or nurse if you are experiencing pain.  . If you have diabetes, keep good control of your blood sugar level. Tell your nurse or your doctor if your glucose levels are higher or lower than normal.  . If you get a rash do not put anything on it unless your doctor or nurse says you may. Keep the area around the rash clean and dry. Ask your doctor for medicine if your rash bothers you.  . Moisturize your skin several times a day  . Avoid sun exposure and apply sunscreen routinely when outdoors  . Infusion reactions may happen after your infusion. If this happens, call 911 for emergency care.   Food and Drug Interactions  . There are no known interactions of pembrolizumab with food.  . This drug may interact with other medicines. Tell your doctor and pharmacist about all the prescription and over-the-counter medicines and dietary supplements (vitamins, minerals, herbs and others) that you are taking at this time. Also, check with your doctor or pharmacist before starting any new prescription or over-the-counter medicines, or dietary supplements to make sure that there are no interactions.  When to Call the Doctor  Call your doctor or nurse if you have any of the following symptoms and/or any new or unusual symptoms:  . Fever of 100.4 F (38 C) or higher  . Chills  . Headache that does not go away  . Tiredness that interferes with your daily activities  . Feeling dizzy or lightheaded  . Wheezing or trouble breathing  . Chest pain  . Dry cough  . Coughing up yellow, green or bloody mucus  .  Nausea that stops you from eating or drinking, and/or that is not relieved by prescribed medicines  . Lasting loss of appetite or rapid weight loss of five pounds in a week  . Diarrhea, 4 times in one day or diarrhea with lack of strength or a feeling of being dizzy  . Blood in your stool  . No bowel movement for 3 days or when you feel uncomfortable  . Extreme weakness that interferes with normal activities  . Decreased urine  . Abnormal blood sugar  . Unusual thirst, passing urine often, headache, sweating, shakiness, irritability  . A new rash and/or itching  . Rash that is not relieved by prescribed medicines  . Pain that does not go away or is not relieved by prescribed medicines, especially in the upper right abdomen  . Flu-like symptoms: fever, headache, muscle and joint aches, and fatigue (low energy, feeling weak)   . Signs of liver problems: dark urine, pale bowel movements, bad stomach pain, feeling very tired and weak, unusual itching, or yellowing of the eyes or skin  . Signs of infusion reactions  such as fever or shaking chills, flushing, facial swelling, feeling dizzy, headache, trouble breathing, rash, itching, chest tightness, or chest pain. If this happens, call 911 for emergency care.  . If you think you are pregnant  Reproduction Warnings  . Pregnancy warning: This drug may have harmful effects on the unborn baby. Women of childbearing potential should use effective methods of birth control during your cancer treatment and for at least 4 months after treatment. Let your doctor know right away if you think you may be pregnant.  . Breast feeding warning: It is not known if this drug passes into breast milk. For this reason, women should not breastfeed during treatment and for 4 months after treatment because this drug could enter the breast milk and cause harm to a breastfeeding baby.  . Fertility warning: Human fertility studies have not been done with this  drug. Talk with your doctor or nurse if you plan to have children.   SELF CARE ACTIVITIES WHILE ON IMMUNOTHERAPY:  Hydration Increase your fluid intake 48 hours prior to treatment and drink at least 8 to 12 cups (64 ounces) of water/decaffeinated beverages per day after treatment. You can still have your cup of coffee or soda but these beverages do not count as part of your 8 to 12 cups that you need to drink daily. No alcohol intake.  Medications Continue taking your normal prescription medication as prescribed.  If you start any new herbal or new supplements please let us know first to make sure it is safe.  Mouth Care Have teeth cleaned professionally before starting treatment. Keep dentures and partial plates clean. Use soft toothbrush and do not use mouthwashes that contain alcohol. Biotene is a good mouthwash that is available at most pharmacies or may be ordered by calling [phone]. Use warm salt water gargles (1 teaspoon salt per 1 quart warm water) before and after meals and at bedtime. Or you may rinse with 2 tablespoons of three-percent hydrogen peroxide mixed in eight ounces of water. If you are still having problems with your mouth or sores in your mouth please call the clinic. If you need dental work, please let the doctor know before you go for your appointment so that we can coordinate the best possible time for you in regards to your chemo regimen. You need to also let your dentist know that you are actively taking chemo. We may need to do labs prior to your dental appointment.  Skin Care Always use sunscreen that has not expired and with SPF (Sun Protection Factor) of 50 or higher. Wear hats to protect your head from the sun. Remember to use sunscreen on your hands, ears, face, & feet.  Use good moisturizing lotions such as udder cream, eucerin, or even Vaseline. Some chemotherapies can cause dry skin, color changes in your skin and nails.    . Avoid long, hot showers or  baths. . Use gentle, fragrance-free soaps and laundry detergent. . Use moisturizers, preferably creams or ointments rather than lotions because the thicker consistency is better at preventing skin dehydration. Apply the cream or ointment within 15 minutes of showering. Reapply moisturizer at night, and moisturize your hands every time after you wash them.   Infection Prevention Please wash your hands for at least 30 seconds using warm soapy water. Handwashing is the #1 way to prevent the spread of germs. Stay away from sick people or people who are getting over a cold. If you develop respiratory systems such as green/yellow  mucus production or productive cough or persistent cough let us know and we will see if you need an antibiotic. It is a good idea to keep a pair of gloves on when going into grocery stores/[employer] to decrease your risk of coming into contact with germs on the carts, etc. Carry alcohol hand gel with you at all times and use it frequently if out in public. If your temperature reaches 100.5 or higher please call the clinic and let us know.  If it is after hours or on the weekend please go to the ER if your temperature is over 100.4.  Please have your own personal thermometer at home to use.    Sex and bodily fluids If you are going to have sex, a condom must be used to protect the person that isn't taking immunotherapy. For a few days after treatment, immunotherapy can be excreted through your bodily fluids.  When using the toilet please close the lid and flush the toilet twice.  Do this for a few day after you have had immunotherapy.   Contraception It is not known for sure whether or not immunotherapy drugs can be passed on through semen or secretions from the vagina. Because of this some doctors advise people to use a barrier method if you have sex during treatment. This applies to vaginal, anal or oral sex.  Generally, doctors advise a barrier method only for the time you are  actually having the treatment and for about a week after your treatment.  Advice like this can be worrying, but this does not mean that you have to avoid being intimate with your partner. You can still have close contact with your partner and continue to enjoy sex.  Animals If you have cats or birds we just ask that you not change the litter or change the cage.  Please have someone else do this for you while you are on immunotherapy.   Food Safety During and After Cancer Treatment Food safety is important for people both during and after cancer treatment. Cancer and cancer treatments, such as chemotherapy, radiation therapy, and stem cell/bone marrow transplantation, often weaken the immune system. This makes it harder for your body to protect itself from foodborne illness, also called food poisoning. Foodborne illness is caused by eating food that contains harmful bacteria, parasites, or viruses.  Foods to avoid Some foods have a higher risk of becoming tainted with bacteria. These include: Marland Kitchen Unwashed fresh fruit and vegetables, especially leafy vegetables that can hide dirt and other contaminants  Raw sprouts, such as alfalfa sprouts . Raw or undercooked beef, especially ground beef, or other raw or undercooked meat and poultry . Fatty, fried, or spicy foods immediately before or after treatment.  These can sit heavy on your stomach and make you feel nauseous. . Raw or undercooked shellfish, such as oysters. . Sushi and sashimi, which often contain raw fish.  . Unpasteurized beverages, such as unpasteurized fruit juices, raw milk, raw yogurt, or cider . Undercooked eggs, such as soft boiled, over easy, and poached; raw, unpasteurized eggs; or foods made with raw egg, such as homemade raw cookie dough and homemade mayonnaise  Simple steps for food safety  Shop smart. . Do not buy food stored or displayed in an unclean area. . Do not buy bruised or damaged fruits or vegetables. . Do not  buy cans that have cracks, dents, or bulges. . Pick up foods that can spoil at the end of your shopping trip and  store them in a cooler on the way home.  Prepare and clean up foods carefully. . Rinse all fresh fruits and vegetables under running water, and dry them with a clean towel or paper towel. . Clean the top of cans before opening them. . After preparing food, wash your hands for 20 seconds with hot water and soap. Pay special attention to areas between fingers and under nails. . Clean your utensils and dishes with hot water and soap. Marland Kitchen Disinfect your kitchen and cutting boards using 1 teaspoon of liquid, unscented bleach mixed into 1 quart of water.    Dispose of old food. . Eat canned and packaged food before its expiration date (the "use by" or "best before" date). . Consume refrigerated leftovers within 3 to 4 days. After that time, throw out the food. Even if the food does not smell or look spoiled, it still may be unsafe. Some bacteria, such as Listeria, can grow even on foods stored in the refrigerator if they are kept for too long.  Take precautions when eating out. . At restaurants, avoid buffets and salad bars where food sits out for a long time and comes in contact with many people. Food can become contaminated when someone with a virus, often a norovirus, or another "bug" handles it. . Put any leftover food in a "to-go" container yourself, rather than having the server do it. And, refrigerate leftovers as soon as you get home. . Choose restaurants that are clean and that are willing to prepare your food as you order it cooked.    SYMPTOMS TO REPORT AS SOON AS POSSIBLE AFTER TREATMENT:   FEVER GREATER THAN 100.4 F  CHILLS WITH OR WITHOUT FEVER  NAUSEA AND VOMITING THAT IS NOT CONTROLLED WITH YOUR NAUSEA MEDICATION  UNUSUAL SHORTNESS OF BREATH  UNUSUAL BRUISING OR BLEEDING  TENDERNESS IN MOUTH AND THROAT WITH OR WITHOUT PRESENCE OF ULCERS  URINARY  PROBLEMS  BOWEL PROBLEMS  UNUSUAL RASH     Wear comfortable clothing and clothing appropriate for easy access to any Portacath or PICC line. Let us know if there is anything that we can do to make your therapy better!   What to do if you need assistance after hours or on the weekends: CALL [phone].  HOLD on the line, do not hang up.  You will hear multiple messages but at the end you will be connected with a nurse triage line.  They will contact the doctor if necessary.  Most of the time they will be able to assist you.  Do not call the hospital operator.     I have been informed and understand all of the instructions given to me and have received a copy. I have been instructed to call the clinic [phone] or my family physician as soon as possible for continued medical care, if indicated. I do not have any more questions at this time but understand that I may call the [facility] or the Patient Navigator at [phone] during office hours should I have questions or need assistance in obtaining follow-up care.[facility] Discharge Instructions for Patients Receiving Chemotherapy  Today you received the following chemotherapy agents  To help prevent nausea and vomiting after your treatment, we encourage you to take your nausea medication    If you develop nausea and vomiting that is not controlled by your nausea medication, call the clinic.   BELOW ARE SYMPTOMS THAT SHOULD BE REPORTED IMMEDIATELY:  *FEVER GREATER THAN 100.5  F  *CHILLS WITH OR WITHOUT FEVER  NAUSEA AND VOMITING THAT IS NOT CONTROLLED WITH YOUR NAUSEA MEDICATION  *UNUSUAL SHORTNESS OF BREATH  *UNUSUAL BRUISING OR BLEEDING  TENDERNESS IN MOUTH AND THROAT WITH OR WITHOUT PRESENCE OF ULCERS  *URINARY PROBLEMS  *BOWEL PROBLEMS  UNUSUAL RASH Items with * indicate a potential emergency and should be followed up as soon as possible.  Feel free to call the clinic should you have any  questions or concerns. The clinic phone number is [phone].  Please show the [facility] at check-in to the Emergency Department and triage nurse.

## 2020-03-25 NOTE — Progress Notes (Signed)
[address] [place], [place] [address]   CLINIC:  Medical Oncology/Hematology  PCP:  Patient, No Pcp Per No address on file None   REASON FOR VISIT:  Follow-up for malignant melanoma.  CURRENT THERAPY: Adjuvant pembrolizumab.  BRIEF ONCOLOGIC HISTORY:  Oncology History  Melanoma of skin ([place])  12/11/2019 Initial Diagnosis   Melanoma of skin ([place])   02/19/2020 Genetic Testing   BRAF Mutation Analysis     03/25/2020 -  Chemotherapy   The patient had pembrolizumab (KEYTRUDA) 200 mg in sodium chloride 0.9 % 50 mL chemo infusion, 200 mg, Intravenous, Once, 1 of 6 cycles Administration: 200 mg (03/25/2020)  for chemotherapy treatment.       CANCER STAGING: Cancer Staging Melanoma of skin ([place]) Staging form: Melanoma of the Skin, AJCC 8th Edition - Clinical stage from 02/08/2020: Stage III (cT2b, cN2a, cM0) - Unsigned    INTERVAL HISTORY:  Donna Munoz 44 y.o. female seen for follow-up of melanoma.  She is feeling well.  Appetite is 75%.  Energy levels are 50%.  Reports occasional nausea which is stable.  Headaches are also stable.  No new onset pains reported.  She had CT scans done on 03/21/2020.    REVIEW OF SYSTEMS:  Review of Systems  Gastrointestinal: Positive for nausea.  Neurological: Positive for headaches.  All other systems reviewed and are negative.    PAST MEDICAL/SURGICAL HISTORY:  Past Medical History:  Diagnosis Date  . Anemia   . Anxiety   . Depression   . GERD (gastroesophageal reflux disease)   . Headache   . Shingles    Past Surgical History:  Procedure Laterality Date  . AMPUTATION TOE Right 03/07/2020   Procedure: RIGHT THIRD TOE AMPUTATION;  Surgeon: [name], MD;  Location: [facility];  Service: General;  Laterality: Right;  . BREAST LUMPECTOMY  age 13  . CESAREAN SECTION  01/06/2006  . CESAREAN SECTION  10/15/2016  . DENTAL SURGERY  2019  . INGUINAL HERNIA REPAIR Right 01/30/2020   Procedure: RIGHT INGUINAL  HERNIA REPAIR WITH  MESH;  Surgeon: [name], MD;  Location: [facility];  Service: General;  Laterality: Right;  . INSERTION OF MESH Right 01/30/2020   Procedure: INSERTION OF MESH;  Surgeon: [name], MD;  Location: [facility];  Service: General;  Laterality: Right;  . IRRIGATION AND DEBRIDEMENT ABSCESS Right 03/07/2020   Procedure: Aspiration of Right Groin Seroma;  Surgeon: [name], MD;  Location: [facility];  Service: General;  Laterality: Right;  . MELANOMA EXCISION WITH SENTINEL LYMPH NODE BIOPSY Right 01/30/2020   Procedure: WIDE LOWER EXCISION RIGHT THIRD TOE MELANOMA EXCISION WITH SENTINEL LYMPH NODE BIOPSY;  Surgeon: [name], MD;  Location: [facility];  Service: General;  Laterality: Right;  . PORTACATH PLACEMENT N/A 03/07/2020   Procedure: INSERTION PORT-A-CATH;  Surgeon: [name], MD;  Location: [facility];  Service: General;  Laterality: N/A;     SOCIAL HISTORY:  Social History   Socioeconomic History  . Marital status: Legally Separated    Spouse name: Not on file  . Number of children: 2  . Years of education: Not on file  . Highest education level: Not on file  Occupational History  . Occupation: unemployed d/t COVID  Tobacco Use  . Smoking status: Current Every Day Smoker    Packs/day: 1.00  . Smokeless tobacco: Never Used  Substance and Sexual Activity  . Alcohol use: No  . Drug use:  Yes    Types: Marijuana  . Sexual activity: Yes  Other Topics Concern  . Not on file  Social History Narrative  . Not on file   Social Determinants of Health   Financial Resource Strain: High Risk  . Difficulty of Paying Living Expenses: Hard  Food Insecurity: No Food Insecurity  . Worried About Charity fundraiser in the Last Year: Never true  . Ran Out of Food in the Last Year: Never true  Transportation Needs: No Transportation Needs  . Lack of Transportation (Medical): No  . Lack of Transportation (Non-Medical):  No  Physical Activity: Inactive  . Days of Exercise per Week: 0 days  . Minutes of Exercise per Session: 0 min  Stress: Stress Concern Present  . Feeling of Stress : Very much  Social Connections: Moderately Isolated  . Frequency of Communication with Friends and Family: Twice a week  . Frequency of Social Gatherings with Friends and Family: Never  . Attends Religious Services: 1 to 4 times per year  . Active Member of Clubs or Organizations: No  . Attends Archivist Meetings: Never  . Marital Status: Separated  Intimate Partner Violence: Not At Risk  . Fear of Current or Ex-Partner: No  . Emotionally Abused: No  . Physically Abused: No  . Sexually Abused: No    FAMILY HISTORY:  Family History  Problem Relation Age of Onset  . Heart disease Mother   . Cancer Mother   . Ovarian cysts Sister   . Healthy Brother   . Cancer Maternal Grandmother   . Heart attack Maternal Grandmother   . Cancer Maternal Grandfather   . Cancer Paternal Grandmother   . Healthy Sister   . Healthy Sister   . Psoriasis Daughter   . Healthy Son     CURRENT MEDICATIONS:  Outpatient Encounter Medications as of 03/25/2020  Medication Sig  . Pembrolizumab (KEYTRUDA IV) Inject into the vein every 21 ( twenty-one) days.  . ondansetron (ZOFRAN) 4 MG tablet Take 1 tablet (4 mg total) by mouth every 8 (eight) hours as needed for nausea or vomiting. (Patient not taking: Reported on 03/25/2020)  . oxyCODONE (OXY IR/ROXICODONE) 5 MG immediate release tablet Take 1 tablet (5 mg total) by mouth every 6 (six) hours as needed for severe pain. (Patient not taking: Reported on 03/25/2020)  . [DISCONTINUED] NIVOLUMAB IV Inject into the vein.  . [DISCONTINUED] scopolamine (TRANSDERM-SCOP) 1 MG/3DAYS Place 1 patch onto the skin every 3 (three) days.   No facility-administered encounter medications on file as of 03/25/2020.    ALLERGIES:  No Known Allergies   PHYSICAL EXAM:  ECOG Performance status:  0  Vitals:   03/25/20 1301  BP: 98/69  Pulse: 77  Resp: 18  Temp: 97.8 F (36.6 C)  SpO2: 100%   Filed Weights   03/25/20 1301  Weight: 105 lb 3.2 oz (47.7 kg)    Physical Exam Vitals reviewed.  Constitutional:      Appearance: Normal appearance.  Skin:    General: Skin is warm.  Neurological:     General: No focal deficit present.     Mental Status: She is alert and oriented to person, place, and time.  Psychiatric:        Mood and Affect: Mood normal.        Behavior: Behavior normal.      LABORATORY DATA:  I have reviewed the labs as listed.  CBC    Component Value Date/Time  WBC 4.5 03/25/2020 1215   RBC 3.96 03/25/2020 1215   HGB 8.8 (L) 03/25/2020 1215   HGB 12.5 08/29/2007 1545   HCT 30.3 (L) 03/25/2020 1215   HCT 37.0 08/29/2007 1545   PLT 301 03/25/2020 1215   PLT 277 08/29/2007 1545   MCV 76.5 (L) 03/25/2020 1215   MCV 84.5 08/29/2007 1545   MCH 22.2 (L) 03/25/2020 1215   MCHC 29.0 (L) 03/25/2020 1215   RDW 19.6 (H) 03/25/2020 1215   RDW 16.2 (H) 08/29/2007 1545   LYMPHSABS 1.5 03/25/2020 1215   LYMPHSABS 2.3 08/29/2007 1545   MONOABS 0.3 03/25/2020 1215   MONOABS 0.3 08/29/2007 1545   EOSABS 0.1 03/25/2020 1215   EOSABS 0.2 08/29/2007 1545   BASOSABS 0.0 03/25/2020 1215   BASOSABS 0.0 08/29/2007 1545   CMP Latest Ref Rng & Units 03/25/2020 03/11/2020 03/07/2020  Glucose 70 - 99 mg/dL 109(H) 82 94  BUN 6 - 20 mg/dL 11 11 7   Creatinine 0.44 - 1.00 mg/dL 0.53 0.59 0.69  Sodium 135 - 145 mmol/L 135 136 139  Potassium 3.5 - 5.1 mmol/L 3.8 3.7 3.9  Chloride 98 - 111 mmol/L 106 104 111  CO2 22 - 32 mmol/L 20(L) 24 20(L)  Calcium 8.9 - 10.3 mg/dL 9.1 9.6 9.4  Total Protein 6.5 - 8.1 g/dL 7.1 7.4 6.9  Total Bilirubin 0.3 - 1.2 mg/dL 0.2(L) 0.3 0.4  Alkaline Phos 38 - 126 U/L 73 78 79  AST 15 - 41 U/L 25 25 26   ALT 0 - 44 U/L 15 17 17        DIAGNOSTIC IMAGING:  I have independently reviewed scans and discussed with her.     ASSESSMENT  & PLAN:   Melanoma of skin ([facility]) 1.  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 -She underwent right third toe amputation at PIP level on 03/07/2020.  Pathology did not show any residual melanoma. -CT chest and abdomen on 03/21/2020 did not show any evidence of metastatic disease. -We talked about adjuvant immunotherapy with 1 year of pembrolizumab. -We discussed the side effects including but not limited to colitis, pneumonitis, dermatitis, hypophysitis. -He does not have any personal history of autoimmune disorders. -I have reviewed her labs.  LFTs and creatinine are normal.  She will proceed with her first cycle of Keytruda today.  I will see her back in 3 weeks for follow-up.  2.  Microcytic anemia: -Labs today showed hemoglobin 8.8 with MCV 76.  We will check her ferritin and iron panel. -She has been taking woman's multivitamin with iron for the past 1 year.  However there is no improvement in her hemoglobin. -I have recommended Feraheme infusion x2.  We will plan to give her first infusion next appointment in 3 weeks. -She does not have any serious medication allergies.  Hence no premedications required.  3.  Right thyroid nodule: -PET scan on 12/20/2019 showed uptake in the right thyroid.  Ultrasound on 02/05/2020 did not show any suspicious nodules.  4.  Family history: -Mother died of throat cancer.  Maternal grandmother had breast cancer.  Maternal aunt had pancreatic cancer.  Maternal grandfather had bone cancer.  Maternal great-grandmother died of melanoma. -Given her extensive family history, she will benefit from genetic testing.      Orders placed this encounter:  No orders of the  defined types were placed in this encounter.    Derek Jack, MD [place] [phone]

## 2020-03-25 NOTE — Progress Notes (Signed)
Patient presents today for treatment and follow up visit with Dr. Delton Coombes. Vital signs stable. Patient has no complaints of any changes since her last visit.   Treatment given today per MD orders. Tolerated infusion without adverse affects. Vital signs stable. No complaints at this time. Discharged from clinic ambulatory. F/U with [facility] as scheduled.

## 2020-03-25 NOTE — Assessment & Plan Note (Addendum)
1.  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 -She underwent right third toe amputation at PIP level on 03/07/2020.  Pathology did not show any residual melanoma. -CT chest and abdomen on 03/21/2020 did not show any evidence of metastatic disease. -We talked about adjuvant immunotherapy with 1 year of pembrolizumab. -We discussed the side effects including but not limited to colitis, pneumonitis, dermatitis, hypophysitis. -He does not have any personal history of autoimmune disorders. -I have reviewed her labs.  LFTs and creatinine are normal.  She will proceed with her first cycle of Keytruda today.  I will see her back in 3 weeks for follow-up.  2.  Microcytic anemia: -Labs today showed hemoglobin 8.8 with MCV 76.  We will check her ferritin and iron panel. -She has been taking woman's multivitamin with iron for the past 1 year.  However there is no improvement in her hemoglobin. -I have recommended Feraheme infusion x2.  We will plan to give her first infusion next appointment in 3 weeks. -She does not have any serious medication allergies.  Hence no premedications required.  3.  Right thyroid nodule: -PET scan on 12/20/2019 showed uptake in the right thyroid.  Ultrasound on 02/05/2020 did not show any suspicious nodules.  4.  Family history: -Mother died of throat cancer.  Maternal grandmother had breast cancer.  Maternal aunt had pancreatic cancer.  Maternal grandfather had bone cancer.  Maternal great-grandmother died of melanoma. -Given her extensive family history, she will benefit from genetic testing.

## 2020-03-25 NOTE — Progress Notes (Signed)
.   Pharmacist Chemotherapy Monitoring - Initial Assessment    Anticipated start date: 03/25/2020   Regimen:  . Are orders appropriate based on the patient's diagnosis, regimen, and cycle? Yes . Does the plan date match the patient's scheduled date? Yes . Is the sequencing of drugs appropriate? Yes . Are the premedications appropriate for the patient's regimen? Yes . Prior Authorization for treatment is: Approved o If applicable, is the correct biosimilar selected based on the patient's insurance? not applicable  Organ Function and Labs: Marland Kitchen Are dose adjustments needed based on the patient's renal function, hepatic function, or hematologic function? No . Are appropriate labs ordered prior to the start of patient's treatment? Yes . Other organ system assessment, if indicated: N/A . The following baseline labs, if indicated, have been ordered: pembrolizumab: baseline TSH +/- T4  Dose Assessment: . Are the drug doses appropriate? Yes . Are the following correct: o Drug concentrations Yes o IV fluid compatible with drug Yes o Administration routes Yes o Timing of therapy Yes . If applicable, does the patient have documented access for treatment and/or plans for port-a-cath placement? not applicable . If applicable, have lifetime cumulative doses been properly documented and assessed? not applicable Lifetime Dose Tracking  No doses have been documented on this patient for the following tracked chemicals: Doxorubicin, Epirubicin, Idarubicin, Daunorubicin, Mitoxantrone, Bleomycin, Oxaliplatin, Carboplatin, Liposomal Doxorubicin  o   Toxicity Monitoring/Prevention: . The patient has the following take home antiemetics prescribed: N/A . The patient has the following take home medications prescribed: N/A . Medication allergies and previous infusion related reactions, if applicable, have been reviewed and addressed. Yes . The patient's current medication list has been assessed for drug-drug  interactions with their chemotherapy regimen. no significant drug-drug interactions were identified on review.  Order Review: . Are the treatment plan orders signed? No . Is the patient scheduled to see a provider prior to their treatment? Yes  I verify that I have reviewed each item in the above checklist and answered each question accordingly.  Wynona Neat 03/25/2020 1:32 PM

## 2020-03-25 NOTE — Patient Instructions (Addendum)
[facility] at [facility] Discharge Instructions  You were seen today by Dr. Katragadda. He went over your recent lab results. He will see you back in 3 weeks for labs, treatment and follow up.   Thank you for choosing [facility] at [facility] to provide your oncology and hematology care.  To afford each patient quality time with our provider, please arrive at least 15 minutes before your scheduled appointment time.   If you have a lab appointment with the Cancer Center please come in thru the  Main Entrance and check in at the main information desk  You need to re-schedule your appointment should you arrive 10 or more minutes late.  We strive to give you quality time with our providers, and arriving late affects you and other patients whose appointments are after yours.  Also, if you no show three or more times for appointments you may be dismissed from the clinic at the providers discretion.     Again, thank you for choosing [facility].  Our hope is that these requests will decrease the amount of time that you wait before being seen by our physicians.       _____________________________________________________________  Should you have questions after your visit to [facility], please contact our office at [phone] between the hours of 8:00 a.m. and 4:30 p.m.  Voicemails left after 4:00 p.m. will not be returned until the following business day.  For prescription refill requests, have your pharmacy contact our office and allow 72 hours.    Cancer Center Support Programs:   > Cancer Support Group  2nd Tuesday of the month 1pm-2pm, Journey Room    

## 2020-03-26 ENCOUNTER — Telehealth (HOSPITAL_COMMUNITY): Payer: Self-pay

## 2020-03-26 MED ORDER — LIDOCAINE-PRILOCAINE 2.5-2.5 % EX CREA
TOPICAL_CREAM | CUTANEOUS | 3 refills | Status: DC
Start: 2020-03-26 — End: 2023-06-02

## 2020-03-26 NOTE — Addendum Note (Signed)
Addended by: Joie Bimler on: 03/26/2020 09:35 AM   Modules accepted: Orders

## 2020-03-26 NOTE — Telephone Encounter (Signed)
Clarification for 24 hr Chemo F/U call Pt received Keytruda yesterday not Opdivo.

## 2020-03-26 NOTE — Telephone Encounter (Signed)
24 hr Chemo F/U call: Pt reports doing well since receiving Opdivo infusion yesterday but had difficulty falling asleep last night.She plans on taking a nap later today Pt denies any N+V, pain,rash or fever/chills at this time. Pt encouraged to call for any questions or concerns and verbalized understanding

## 2020-03-27 LAB — T4: T4, Total: 7 ug/dL (ref 4.5–12.0)

## 2020-03-29 ENCOUNTER — Telehealth (HOSPITAL_COMMUNITY): Payer: Self-pay

## 2020-03-29 ENCOUNTER — Other Ambulatory Visit (HOSPITAL_COMMUNITY): Payer: Self-pay | Admitting: Nurse Practitioner

## 2020-03-29 DIAGNOSIS — C439 Malignant melanoma of skin, unspecified: Secondary | ICD-10-CM

## 2020-03-29 MED ORDER — ONDANSETRON 4 MG PO TBDP: 4.0000 mg | ORAL_TABLET | Freq: Three times a day (TID) | ORAL | 0 refills | Status: DC | PRN

## 2020-03-29 NOTE — Telephone Encounter (Signed)
Nutrition Assessment   Reason for Assessment:  Patient identified on Malnutrition Screening report for poor appetite and weight loss   ASSESSMENT:  44 year old female with melanoma of 3rd right toe.  S/p toe amputation on 03/07/2020.  Patient receiving pembrolizumab  Spoke with patient via phone.  Patient reports that for the last month and half has no appetite.  Reports issues with nausea.  Sets at the table for meal and feels like food is swelling in her mouth/nauseated.  Reports patches worked best for her but caused her to break out/rash.  She reports zofran on her tongue works better than pills.  Currently has pills.  Reports that she mainly eats salads, fruit, ice cream.  Never has eaten breakfast.  Dinner last night she ate well (pork loin, mashed potatoes, beans).  Has not been drinking supplements shakes recently.     Medications: reviewed   Labs: reviewed   Anthropometrics:   Height: 62 inches Weight: 105 lb on 5/17 Noted 120 lb on 4/1 BMI: 19  Patient reports she has always had a hard time gaining weight  12% weight loss in the last month, significant   NUTRITION DIAGNOSIS: Inadequate oral intake related to cancer related treatment side effects as evidenced by 12% weight loss in the last month   INTERVENTION:  Will touch base with NP and triage RN regarding patient request for on the tongue zofran. Discussed strategies to help with nausea and will mail handout.  Encouraged being proactive with nausea medications.  Encouraged using high calorie oral nutrition supplements Discussed ways to add calories and protein in diet.  Contact information provided   MONITORING, EVALUATION, GOAL: weight trends, intake   Next Visit: June 18th phone f/u  Joli B. Zenia Resides, [place], [place] Registered Dietitian [phone] (pager)

## 2020-03-29 NOTE — Progress Notes (Signed)
zofran ODT

## 2020-04-12 NOTE — Progress Notes (Signed)

## 2020-04-15 ENCOUNTER — Other Ambulatory Visit: Payer: Self-pay | Admitting: General Surgery

## 2020-04-15 ENCOUNTER — Inpatient Hospital Stay (HOSPITAL_COMMUNITY): Payer: Medicaid Other | Attending: Hematology

## 2020-04-15 ENCOUNTER — Inpatient Hospital Stay (HOSPITAL_BASED_OUTPATIENT_CLINIC_OR_DEPARTMENT_OTHER): Payer: Medicaid Other | Admitting: Hematology

## 2020-04-15 ENCOUNTER — Inpatient Hospital Stay (HOSPITAL_COMMUNITY): Payer: Medicaid Other

## 2020-04-15 ENCOUNTER — Other Ambulatory Visit: Payer: Self-pay

## 2020-04-15 VITALS — BP 101/68 | HR 90 | Temp 96.9°F | Resp 18 | Wt 103.2 lb

## 2020-04-15 VITALS — BP 98/57 | HR 74 | Temp 97.0°F | Resp 18

## 2020-04-15 DIAGNOSIS — E041 Nontoxic single thyroid nodule: Secondary | ICD-10-CM | POA: Diagnosis not present

## 2020-04-15 DIAGNOSIS — D509 Iron deficiency anemia, unspecified: Secondary | ICD-10-CM

## 2020-04-15 DIAGNOSIS — Z79899 Other long term (current) drug therapy: Secondary | ICD-10-CM | POA: Insufficient documentation

## 2020-04-15 DIAGNOSIS — C4371 Malignant melanoma of right lower limb, including hip: Secondary | ICD-10-CM | POA: Diagnosis not present

## 2020-04-15 DIAGNOSIS — C439 Malignant melanoma of skin, unspecified: Secondary | ICD-10-CM

## 2020-04-15 DIAGNOSIS — Z5111 Encounter for antineoplastic chemotherapy: Secondary | ICD-10-CM | POA: Insufficient documentation

## 2020-04-15 LAB — MAGNESIUM: Magnesium: 1.9 mg/dL (ref 1.7–2.4)

## 2020-04-15 LAB — CBC WITH DIFFERENTIAL/PLATELET
Abs Immature Granulocytes: 0.01 10*3/uL (ref 0.00–0.07)
Basophils Absolute: 0 10*3/uL (ref 0.0–0.1)
Basophils Relative: 1 %
Eosinophils Absolute: 0.1 10*3/uL (ref 0.0–0.5)
Eosinophils Relative: 3 %
HCT: 32.3 % — ABNORMAL LOW (ref 36.0–46.0)
Hemoglobin: 9.3 g/dL — ABNORMAL LOW (ref 12.0–15.0)
Immature Granulocytes: 0 %
Lymphocytes Relative: 35 %
Lymphs Abs: 1.5 10*3/uL (ref 0.7–4.0)
MCH: 22.2 pg — ABNORMAL LOW (ref 26.0–34.0)
MCHC: 28.8 g/dL — ABNORMAL LOW (ref 30.0–36.0)
MCV: 77.1 fL — ABNORMAL LOW (ref 80.0–100.0)
Monocytes Absolute: 0.3 10*3/uL (ref 0.1–1.0)
Monocytes Relative: 8 %
Neutro Abs: 2.2 10*3/uL (ref 1.7–7.7)
Neutrophils Relative %: 53 %
Platelets: 247 10*3/uL (ref 150–400)
RBC: 4.19 MIL/uL (ref 3.87–5.11)
RDW: 19.8 % — ABNORMAL HIGH (ref 11.5–15.5)
WBC: 4.2 10*3/uL (ref 4.0–10.5)
nRBC: 0 % (ref 0.0–0.2)

## 2020-04-15 LAB — COMPREHENSIVE METABOLIC PANEL
ALT: 17 U/L (ref 0–44)
AST: 23 U/L (ref 15–41)
Albumin: 3.8 g/dL (ref 3.5–5.0)
Alkaline Phosphatase: 84 U/L (ref 38–126)
Anion gap: 9 (ref 5–15)
BUN: 10 mg/dL (ref 6–20)
CO2: 24 mmol/L (ref 22–32)
Calcium: 9.5 mg/dL (ref 8.9–10.3)
Chloride: 103 mmol/L (ref 98–111)
Creatinine, Ser: 0.56 mg/dL (ref 0.44–1.00)
GFR calc Af Amer: >60 mL/min (ref >60)
GFR calc non Af Amer: >60 mL/min (ref >60)
Glucose, Bld: 84 mg/dL (ref 70–99)
Potassium: 4.1 mmol/L (ref 3.5–5.1)
Sodium: 136 mmol/L (ref 135–145)
Total Bilirubin: 0.2 mg/dL — ABNORMAL LOW (ref 0.3–1.2)
Total Protein: 7.2 g/dL (ref 6.5–8.1)

## 2020-04-15 MED ORDER — SODIUM CHLORIDE 0.9% FLUSH
10.0000 mL | INTRAVENOUS | Status: DC | PRN
  Administered 2020-04-15: 10 mL

## 2020-04-15 MED ORDER — HEPARIN SOD (PORK) LOCK FLUSH 100 UNIT/ML IV SOLN
500.0000 [IU] | Freq: Once | INTRAVENOUS | Status: AC | PRN
  Administered 2020-04-15: 500 [IU]

## 2020-04-15 MED ORDER — SODIUM CHLORIDE 0.9 % IV SOLN
510.0000 mg | Freq: Once | INTRAVENOUS | Status: AC
  Administered 2020-04-15: 510 mg via INTRAVENOUS
  Filled 2020-04-15: qty 510

## 2020-04-15 MED ORDER — SODIUM CHLORIDE 0.9 % IV SOLN
200.0000 mg | Freq: Once | INTRAVENOUS | Status: AC
  Administered 2020-04-15: 200 mg via INTRAVENOUS
  Filled 2020-04-15: qty 8

## 2020-04-15 MED ORDER — SODIUM CHLORIDE 0.9 % IV SOLN: Freq: Once | INTRAVENOUS | Status: AC

## 2020-04-15 NOTE — Progress Notes (Signed)
Donna Munoz presents today for dose 1 Feraheme and D1C2 Keytruda. Pt denies any changes since last treatment. No new symptoms reported. Vitals and lab results stable. Assessed by Dr. Delton Coombes who approves proceeding with treatment today.  Infusions tolerated without incident or complaint. VSS. Port flushed and deaccessed. Discharged in satisfactory condition with follow up instructions.

## 2020-04-15 NOTE — Patient Instructions (Signed)
[facility] at [facility] Discharge Instructions  You were seen today by Dr. Delton Coombes. He went over your recent results. You will get a prescription for Chantix starter pack to help with smoking cessation. Dr. Delton Coombes will see you back in 3 weeks for labs and follow up.   Thank you for choosing [facility] at [facility] to provide your oncology and hematology care.  To afford each patient quality time with our provider, please arrive at least 15 minutes before your scheduled appointment time.   If you have a lab appointment with the [facility] please come in thru the Main Entrance and check in at the main information desk  You need to re-schedule your appointment should you arrive 10 or more minutes late.  We strive to give you quality time with our providers, and arriving late affects you and other patients whose appointments are after yours.  Also, if you no show three or more times for appointments you may be dismissed from the clinic at the providers discretion.     Again, thank you for choosing [facility].  Our hope is that these requests will decrease the amount of time that you wait before being seen by our physicians.       _____________________________________________________________  Should you have questions after your visit to [facility], please contact our office at [phone] between the hours of 8:00 a.m. and 4:30 p.m.  Voicemails left after 4:00 p.m. will not be returned until the following business day.  For prescription refill requests, have your pharmacy contact our office and allow 72 hours.    Cancer Center Support Programs:   > Cancer Support Group  2nd Tuesday of the month 1pm-2pm, Journey Room

## 2020-04-15 NOTE — Patient Instructions (Signed)
[facility] Discharge Instructions for Patients Receiving Chemotherapy   Beginning January 23rd 2017 lab work for the [facility] will be done in the  Main lab at [facility] on 1st floor. If you have a lab appointment with the [facility] please come in thru the  Main Entrance and check in at the main information desk   Today you received the following chemotherapy agents Keytruda and Feraheme  To help prevent nausea and vomiting after your treatment, we encourage you to take your nausea medication    If you develop nausea and vomiting, or diarrhea that is not controlled by your medication, call the clinic.  The clinic phone number is [phone]. Office hours are Monday-Friday 8:30am-5:00pm.  BELOW ARE SYMPTOMS THAT SHOULD BE REPORTED IMMEDIATELY:  *FEVER GREATER THAN 101.0 F  *CHILLS WITH OR WITHOUT FEVER  NAUSEA AND VOMITING THAT IS NOT CONTROLLED WITH YOUR NAUSEA MEDICATION  *UNUSUAL SHORTNESS OF BREATH  *UNUSUAL BRUISING OR BLEEDING  TENDERNESS IN MOUTH AND THROAT WITH OR WITHOUT PRESENCE OF ULCERS  *URINARY PROBLEMS  *BOWEL PROBLEMS  UNUSUAL RASH Items with * indicate a potential emergency and should be followed up as soon as possible. If you have an emergency after office hours please contact your primary care physician or go to the nearest emergency department.  Please call the clinic during office hours if you have any questions or concerns.   You may also contact the Patient Navigator at [phone] should you have any questions or need assistance in obtaining follow up care.      Resources For Cancer Patients and their Caregivers ? American Cancer Society: Can assist with transportation, wigs, general needs, runs Look Good Feel Better.        845-709-4220 ? Cancer Care: Provides financial assistance, online support groups, medication/co-pay assistance.  1-800-813-HOPE 725-067-0691) ? [place] Assists  [place] cancer patients and their families through emotional , educational and financial support.  8038839003 ? [place] DSS Where to apply for food stamps, Medicaid and utility assistance. (862) 693-5972 ? [facility]: Transportation to medical appointments. (786) 653-3366 ? Social Security Administration: May apply for disability if have a Stage IV cancer. 207 424 0073 7576324691 ? [employer], Disability and Transit Services: Assists with nutrition, care and transit needs. 269-264-6445

## 2020-04-15 NOTE — Progress Notes (Signed)
[place] [facility], [place] [address]   CLINIC:  Medical Oncology/Hematology  PCP:  Patient, No Pcp Per None None   REASON FOR VISIT:  Follow-up for malignant melanoma.  PRIOR THERAPY: None.  CURRENT THERAPY: Adjuvant pembrolizumab.  BRIEF ONCOLOGIC HISTORY:  Oncology History  Melanoma of skin ([place])  12/11/2019 Initial Diagnosis   Melanoma of skin ([place])   02/19/2020 Genetic Testing   BRAF Mutation Analysis     03/25/2020 -  Chemotherapy   The patient had pembrolizumab (KEYTRUDA) 200 mg in sodium chloride 0.9 % 50 mL chemo infusion, 200 mg, Intravenous, Once, 1 of 6 cycles Administration: 200 mg (03/25/2020)  for chemotherapy treatment.      CANCER STAGING: Cancer Staging Melanoma of skin ([place]) Staging form: Melanoma of the Skin, AJCC 8th Edition - Clinical stage from 02/08/2020: Stage III (cT2b, cN2a, cM0) - Unsigned   INTERVAL HISTORY:  Donna Munoz, a 44 y.o. female, returns for routine follow-up and consideration for next cycle of chemotherapy. Donna Munoz was last seen on 03/25/2020.  Due for cycle #2 of pembrolizumab today.   Overall, she tells me she has been feeling pretty well. She reports having insomnia x2 days after her last chemo treatment. Otherwise she denies having N/V/D, cough, or numbness/tingling since her last chemo. She has lost weight since March, and reports early satiety. She reports feeling nauseous at random times of the day and when she eats solid food, but denies nausea with liquids. The nausea is somewhat alleviated with Zofran. She also has pain on the dorsal side of her R foot and on the lateral side of her lower leg.  Her surgeon has scheduled a biopsy on her thyroid on 05/01/2020 at [facility]. She has tried quitting smoking and is looking for help with smoking cessation. She is currently smoking 1.5 packs per day.  Overall, she feels ready for next cycle of chemo today.    REVIEW OF SYSTEMS:  Review of  Systems  Constitutional: Positive for appetite change (moderately decreased) and fatigue (severe).  Gastrointestinal: Positive for nausea. Negative for diarrhea and vomiting.  Musculoskeletal: Positive for arthralgias (3/10 pain in R foot).  Neurological: Positive for numbness (R foot).  All other systems reviewed and are negative.   PAST MEDICAL/SURGICAL HISTORY:  Past Medical History:  Diagnosis Date   Anemia    Anxiety    Depression    GERD (gastroesophageal reflux disease)    Headache    Shingles    Past Surgical History:  Procedure Laterality Date   AMPUTATION TOE Right 03/07/2020   Procedure: RIGHT THIRD TOE AMPUTATION;  Surgeon: [name], MD;  Location: [place] [facility];  Service: General;  Laterality: Right;   BREAST LUMPECTOMY  age 24   CESAREAN SECTION  01/06/2006   CESAREAN SECTION  10/15/2016   DENTAL SURGERY  2019   INGUINAL HERNIA REPAIR Right 01/30/2020   Procedure: RIGHT INGUINAL HERNIA REPAIR WITH  MESH;  Surgeon: [name], MD;  Location: [place];  Service: General;  Laterality: Right;   INSERTION OF MESH Right 01/30/2020   Procedure: INSERTION OF MESH;  Surgeon: [name], MD;  Location: [place];  Service: General;  Laterality: Right;   IRRIGATION AND DEBRIDEMENT ABSCESS Right 03/07/2020   Procedure: Aspiration of Right Groin Seroma;  Surgeon: [name], MD;  Location: [place];  Service: General;  Laterality: Right;   MELANOMA EXCISION WITH SENTINEL LYMPH NODE BIOPSY Right 01/30/2020   Procedure: WIDE  LOWER EXCISION RIGHT THIRD TOE MELANOMA EXCISION WITH SENTINEL LYMPH NODE BIOPSY;  Surgeon: [name], MD;  Location: [place];  Service: General;  Laterality: Right;   PORTACATH PLACEMENT N/A 03/07/2020   Procedure: INSERTION PORT-A-CATH;  Surgeon: [name], MD;  Location: [place];  Service: General;  Laterality: N/A;    SOCIAL HISTORY:  Social History   Socioeconomic History    Marital status: Legally Separated    Spouse name: Not on file   Number of children: 2   Years of education: Not on file   Highest education level: Not on file  Occupational History   Occupation: unemployed d/t COVID  Tobacco Use   Smoking status: Current Every Day Smoker    Packs/day: 1.00   Smokeless tobacco: Never Used  Substance and Sexual Activity   Alcohol use: No   Drug use: Yes    Types: Marijuana   Sexual activity: Yes  Other Topics Concern   Not on file  Social History Narrative   Not on file   Social Determinants of Health   Financial Resource Strain: High Risk   Difficulty of Paying Living Expenses: Hard  Food Insecurity: No Food Insecurity   Worried About Running Out of Food in the Last Year: Never true   Ran Out of Food in the Last Year: Never true  Transportation Needs: No Transportation Needs   Lack of Transportation (Medical): No   Lack of Transportation (Non-Medical): No  Physical Activity: Inactive   Days of Exercise per Week: 0 days   Minutes of Exercise per Session: 0 min  Stress: Stress Concern Present   Feeling of Stress : Very much  Social Connections: Moderately Isolated   Frequency of Communication with Friends and Family: Twice a week   Frequency of Social Gatherings with Friends and Family: Never   Attends Religious Services: 1 to 4 times per year   Active Member of [employer] or Organizations: No   Attends Music therapist: Never   Marital Status: Separated  Human resources officer Violence: Not At Risk   Fear of Current or Ex-Partner: No   Emotionally Abused: No   Physically Abused: No   Sexually Abused: No    FAMILY HISTORY:  Family History  Problem Relation Age of Onset   Heart disease Mother    Cancer Mother    Ovarian cysts Sister    Healthy Brother    Cancer Maternal Grandmother    Heart attack Maternal Grandmother    Cancer Maternal Grandfather    Cancer Paternal Grandmother     Healthy Sister    Healthy Sister    Psoriasis Daughter    Healthy Son     CURRENT MEDICATIONS:  Current Outpatient Medications  Medication Sig Dispense Refill   lidocaine-prilocaine (EMLA) cream Apply a small amount to port a cath site (do not rub in) and cover with plastic wrap 1 hour prior to chemotherapy appointments (Patient not taking: Reported on 04/15/2020) 30 g 3   ondansetron (ZOFRAN ODT) 4 MG disintegrating tablet Take 1 tablet (4 mg total) by mouth every 8 (eight) hours as needed for nausea or vomiting. (Patient not taking: Reported on 04/15/2020) 30 tablet 0   ondansetron (ZOFRAN) 4 MG tablet Take 1 tablet (4 mg total) by mouth every 8 (eight) hours as needed for nausea or vomiting. (Patient not taking: Reported on 03/25/2020) 45 tablet 1   oxyCODONE (OXY IR/ROXICODONE) 5 MG immediate release tablet Take 1 tablet (5 mg total) by mouth every  6 (six) hours as needed for severe pain. (Patient not taking: Reported on 03/25/2020) 40 tablet 0   Pembrolizumab (KEYTRUDA IV) Inject into the vein every 21 ( twenty-one) days.     No current facility-administered medications for this visit.    ALLERGIES:  No Known Allergies  PHYSICAL EXAM:  Performance status (ECOG): 0 - Asymptomatic  Vitals:   04/15/20 1301  BP: 101/68  Pulse: 90  Resp: 18  Temp: (!) 96.9 F (36.1 C)  SpO2: 100%   Wt Readings from Last 3 Encounters:  04/15/20 103 lb 3.2 oz (46.8 kg)  03/25/20 105 lb 3.2 oz (47.7 kg)  03/11/20 106 lb 3.2 oz (48.2 kg)   Physical Exam Vitals reviewed.  Constitutional:      Appearance: Normal appearance.  Cardiovascular:     Rate and Rhythm: Normal rate and regular rhythm.     Pulses: Normal pulses.     Heart sounds: Normal heart sounds.  Pulmonary:     Effort: Pulmonary effort is normal.     Breath sounds: Normal breath sounds.  Feet:     Comments: R middle toe amputated Neurological:     General: No focal deficit present.     Mental Status: She is alert and  oriented to person, place, and time.  Psychiatric:        Mood and Affect: Mood normal.        Behavior: Behavior normal.     LABORATORY DATA:  I have reviewed the labs as listed.  CBC Latest Ref Rng & Units 04/15/2020 03/25/2020 03/11/2020  WBC 4.0 - 10.5 K/uL 4.2 4.5 7.7  Hemoglobin 12.0 - 15.0 g/dL 9.3(L) 8.8(L) 9.4(L)  Hematocrit 36.0 - 46.0 % 32.3(L) 30.3(L) 33.0(L)  Platelets 150 - 400 K/uL 247 301 277   CMP Latest Ref Rng & Units 04/15/2020 03/25/2020 03/11/2020  Glucose 70 - 99 mg/dL 84 109(H) 82  BUN 6 - 20 mg/dL '10 11 11  '$ Creatinine 0.44 - 1.00 mg/dL 0.56 0.53 0.59  Sodium 135 - 145 mmol/L 136 135 136  Potassium 3.5 - 5.1 mmol/L 4.1 3.8 3.7  Chloride 98 - 111 mmol/L 103 106 104  CO2 22 - 32 mmol/L 24 20(L) 24  Calcium 8.9 - 10.3 mg/dL 9.5 9.1 9.6  Total Protein 6.5 - 8.1 g/dL 7.2 7.1 7.4  Total Bilirubin 0.3 - 1.2 mg/dL 0.2(L) 0.2(L) 0.3  Alkaline Phos 38 - 126 U/L 84 73 78  AST 15 - 41 U/L '23 25 25  '$ ALT 0 - 44 U/L '17 15 17    '$ DIAGNOSTIC IMAGING:  I have independently reviewed the scans and discussed with the patient. CT Chest W Contrast  Result Date: 03/22/2020 CLINICAL DATA:  Follow-up right foot melanoma. Ongoing immunotherapy. Previous surgery. EXAM: CT CHEST AND ABDOMEN WITH CONTRAST TECHNIQUE: Multidetector CT imaging of the chest and abdomen was performed following the standard protocol during bolus administration of intravenous contrast. CONTRAST:  149m OMNIPAQUE IOHEXOL 300 MG/ML  SOLN COMPARISON:  PET-CT on 12/20/2019 FINDINGS: CT CHEST FINDINGS Cardiovascular: No acute findings. Mediastinum/Lymph Nodes: No masses or pathologically enlarged lymph nodes identified. Lungs/Pleura: No pulmonary infiltrate or mass identified. No effusion present. Musculoskeletal: No suspicious bone lesions or other significant abnormality. CT ABDOMEN FINDINGS Hepatobiliary: No masses identified. Gallbladder is unremarkable. No evidence of biliary ductal dilatation. Pancreas:  No mass or  inflammatory changes. Spleen:  Within normal limits in size and appearance. Adrenals/Urinary tract: 4 mm calculus noted in lower pole of left kidney. No masses or hydronephrosis.  Stomach/Bowel: Unremarkable visualized abdominal bowel loops. Vascular/Lymphatic: No pathologically enlarged lymph nodes identified. No abdominal aortic aneurysm. Aortic atherosclerosis noted. Other:  None. Musculoskeletal:  No suspicious bone lesions identified. IMPRESSION: 1. No evidence of metastatic disease or other acute findings. 2. Tiny left renal calculus.  No evidence of hydronephrosis. Aortic Atherosclerosis (ICD10-I70.0). Electronically Signed   By: Marlaine Hind M.D.   On: 03/22/2020 08:29   CT Abdomen W Contrast  Result Date: 03/22/2020 CLINICAL DATA:  Follow-up right foot melanoma. Ongoing immunotherapy. Previous surgery. EXAM: CT CHEST AND ABDOMEN WITH CONTRAST TECHNIQUE: Multidetector CT imaging of the chest and abdomen was performed following the standard protocol during bolus administration of intravenous contrast. CONTRAST:  125m OMNIPAQUE IOHEXOL 300 MG/ML  SOLN COMPARISON:  PET-CT on 12/20/2019 FINDINGS: CT CHEST FINDINGS Cardiovascular: No acute findings. Mediastinum/Lymph Nodes: No masses or pathologically enlarged lymph nodes identified. Lungs/Pleura: No pulmonary infiltrate or mass identified. No effusion present. Musculoskeletal: No suspicious bone lesions or other significant abnormality. CT ABDOMEN FINDINGS Hepatobiliary: No masses identified. Gallbladder is unremarkable. No evidence of biliary ductal dilatation. Pancreas:  No mass or inflammatory changes. Spleen:  Within normal limits in size and appearance. Adrenals/Urinary tract: 4 mm calculus noted in lower pole of left kidney. No masses or hydronephrosis. Stomach/Bowel: Unremarkable visualized abdominal bowel loops. Vascular/Lymphatic: No pathologically enlarged lymph nodes identified. No abdominal aortic aneurysm. Aortic atherosclerosis noted. Other:   None. Musculoskeletal:  No suspicious bone lesions identified. IMPRESSION: 1. No evidence of metastatic disease or other acute findings. 2. Tiny left renal calculus.  No evidence of hydronephrosis. Aortic Atherosclerosis (ICD10-I70.0). Electronically Signed   By: JMarlaine HindM.D.   On: 03/22/2020 08:29     ASSESSMENT:  1.  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  Pathology did not show any residual melanoma. -CT chest and abdomen on 03/21/2020 did not show any evidence of metastatic disease. -Adjuvant immunotherapy with pembrolizumab 1 year recommended. -Pembrolizumab started on 03/25/2020.  2.  Family history: -Mother died of throat cancer.  Maternal grandmother had breast cancer.  Maternal aunt had pancreatic cancer.  Maternal grandfather had bone cancer.  Maternal great grandmother died of melanoma. -She will benefit from genetic testing.    PLAN:  1.  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  She has tolerated first cycle reasonably well. -She will proceed with her cycle two today without any dose changes. -She will be reevaluated in 3 weeks.  2.  Microcytic anemia: -Hemoglobin today is 9.3 with MCV of 77. -She is taking woman's multivitamin with iron for the past 1 year. -I have recommended Feraheme infusion x2.  We talked about side effects of Feraheme including but not limited to rare chance of anaphylactic reaction. -She will proceed with her first dose today and second dose prior to cycle three.  3.  Right thyroid nodule: -PET scan on 12/20/2019 showed uptake in the right thyroid. -Ultrasound on 02/05/2020 did not show any suspicious  nodules. -Dr. BLeory Plowmanreportedly scheduled her for biopsy by radiology.    Orders placed this encounter:  No orders of the defined types were placed in this encounter.    SDerek Jack MD [phone]  Saunders Revel, am acting as a scribe for Dr. Sanda Linger.  I, Derek Jack MD, have reviewed the above documentation for accuracy and completeness, and I agree with the above.

## 2020-04-17 ENCOUNTER — Other Ambulatory Visit (HOSPITAL_COMMUNITY): Payer: Self-pay | Admitting: *Deleted

## 2020-04-17 MED ORDER — CHANTIX STARTING MONTH PAK 0.5 MG X 11 & 1 MG X 42 PO TABS: ORAL_TABLET | ORAL | 0 refills | Status: DC

## 2020-04-17 NOTE — Progress Notes (Signed)
Verbal orders received from Dr. Delton Coombes for Chantix started pack for patient.  Sent to patient's pharmacy and patient is aware.

## 2020-04-26 ENCOUNTER — Ambulatory Visit (HOSPITAL_COMMUNITY): Payer: Medicaid Other

## 2020-04-26 NOTE — Progress Notes (Signed)
Nutrition Follow-up:  Patient with melanoma of 3rd right toe.  S/p toe amputation on 03/07/2020.  Patient receiving pembrolizumab.   Spoke with patient via phone for nutrition follow-up.  Patient reports that she still does not have much of an appetite.  Reports body is rejecting meats and craving things like salads, jello, ice cream, fruit.  Reports that she did have country style steak with deviled eggs recently and was able to tolerate it.  Zofran ODT works better for her than pill form.  Reports soft stool not diarrhea after eating.  Concerned about her weight loss    Medications: chantix  Labs: reviewed  Anthropometrics:   Weight 103 lb decreased from 105 lb on 5/17     NUTRITION DIAGNOSIS: Inadequate oral intake continues   INTERVENTION:  Patient may benefit from trial of appetite stimulant.   Encouraged setting schedule to eat q 2 hours. Encouraged oral nutrition supplements 350 calorie if possible.  Had hard time finding shakes on shelf. Discussed ways to add more calories and protein to diet.      MONITORING, EVALUATION, GOAL: weight trends, intake   NEXT VISIT: July 23 phone f/u  Donna Munoz B. Zenia Resides, [place], [place] Registered Dietitian [phone] (pager)

## 2020-05-01 ENCOUNTER — Ambulatory Visit
Admission: RE | Admit: 2020-05-01 | Discharge: 2020-05-01 | Disposition: A | Discharge status: Home / Self Care | Payer: Medicaid Other | Source: Ambulatory Visit | Attending: General Surgery | Admitting: General Surgery

## 2020-05-01 ENCOUNTER — Other Ambulatory Visit (HOSPITAL_COMMUNITY)
Admission: RE | Admit: 2020-05-01 | Discharge: 2020-05-01 | Disposition: A | Discharge status: Home / Self Care | Payer: Medicaid Other | Source: Ambulatory Visit | Attending: General Surgery | Admitting: General Surgery

## 2020-05-01 DIAGNOSIS — E041 Nontoxic single thyroid nodule: Secondary | ICD-10-CM | POA: Insufficient documentation

## 2020-05-02 LAB — CYTOLOGY - NON PAP

## 2020-05-06 ENCOUNTER — Inpatient Hospital Stay (HOSPITAL_COMMUNITY): Payer: Medicaid Other

## 2020-05-06 ENCOUNTER — Encounter (HOSPITAL_COMMUNITY): Payer: Self-pay

## 2020-05-06 ENCOUNTER — Encounter (HOSPITAL_COMMUNITY): Payer: Self-pay | Admitting: Oncology

## 2020-05-06 ENCOUNTER — Other Ambulatory Visit (HOSPITAL_COMMUNITY): Payer: Medicaid Other

## 2020-05-06 ENCOUNTER — Other Ambulatory Visit: Payer: Self-pay

## 2020-05-06 ENCOUNTER — Ambulatory Visit (HOSPITAL_COMMUNITY): Payer: Medicaid Other | Admitting: Oncology

## 2020-05-06 ENCOUNTER — Inpatient Hospital Stay (HOSPITAL_BASED_OUTPATIENT_CLINIC_OR_DEPARTMENT_OTHER): Payer: Medicaid Other | Admitting: Oncology

## 2020-05-06 VITALS — BP 96/46 | HR 68 | Temp 97.4°F | Resp 18

## 2020-05-06 VITALS — BP 94/55 | HR 72 | Temp 98.2°F | Resp 18 | Wt 104.1 lb

## 2020-05-06 DIAGNOSIS — C4371 Malignant melanoma of right lower limb, including hip: Secondary | ICD-10-CM

## 2020-05-06 DIAGNOSIS — C439 Malignant melanoma of skin, unspecified: Secondary | ICD-10-CM

## 2020-05-06 DIAGNOSIS — Z720 Tobacco use: Secondary | ICD-10-CM | POA: Diagnosis not present

## 2020-05-06 DIAGNOSIS — D500 Iron deficiency anemia secondary to blood loss (chronic): Secondary | ICD-10-CM

## 2020-05-06 DIAGNOSIS — Z5111 Encounter for antineoplastic chemotherapy: Secondary | ICD-10-CM | POA: Diagnosis not present

## 2020-05-06 DIAGNOSIS — D509 Iron deficiency anemia, unspecified: Secondary | ICD-10-CM

## 2020-05-06 HISTORY — DX: Tobacco use: Z72.0

## 2020-05-06 LAB — CBC WITH DIFFERENTIAL/PLATELET
Abs Immature Granulocytes: 0.01 10*3/uL (ref 0.00–0.07)
Basophils Absolute: 0 10*3/uL (ref 0.0–0.1)
Basophils Relative: 1 %
Eosinophils Absolute: 0.1 10*3/uL (ref 0.0–0.5)
Eosinophils Relative: 3 %
HCT: 36 % (ref 36.0–46.0)
Hemoglobin: 10.8 g/dL — ABNORMAL LOW (ref 12.0–15.0)
Immature Granulocytes: 0 %
Lymphocytes Relative: 31 %
Lymphs Abs: 1.3 10*3/uL (ref 0.7–4.0)
MCH: 25.4 pg — ABNORMAL LOW (ref 26.0–34.0)
MCHC: 30 g/dL (ref 30.0–36.0)
MCV: 84.5 fL (ref 80.0–100.0)
Monocytes Absolute: 0.3 10*3/uL (ref 0.1–1.0)
Monocytes Relative: 8 %
Neutro Abs: 2.5 10*3/uL (ref 1.7–7.7)
Neutrophils Relative %: 57 %
Platelets: 226 10*3/uL (ref 150–400)
RBC: 4.26 MIL/uL (ref 3.87–5.11)
RDW: 25.3 % — ABNORMAL HIGH (ref 11.5–15.5)
WBC: 4.3 10*3/uL (ref 4.0–10.5)
nRBC: 0 % (ref 0.0–0.2)

## 2020-05-06 LAB — COMPREHENSIVE METABOLIC PANEL
ALT: 31 U/L (ref 0–44)
AST: 28 U/L (ref 15–41)
Albumin: 3.6 g/dL (ref 3.5–5.0)
Alkaline Phosphatase: 75 U/L (ref 38–126)
Anion gap: 8 (ref 5–15)
BUN: 10 mg/dL (ref 6–20)
CO2: 27 mmol/L (ref 22–32)
Calcium: 9.9 mg/dL (ref 8.9–10.3)
Chloride: 103 mmol/L (ref 98–111)
Creatinine, Ser: 0.55 mg/dL (ref 0.44–1.00)
GFR calc Af Amer: >60 mL/min (ref >60)
GFR calc non Af Amer: >60 mL/min (ref >60)
Glucose, Bld: 99 mg/dL (ref 70–99)
Potassium: 4.1 mmol/L (ref 3.5–5.1)
Sodium: 138 mmol/L (ref 135–145)
Total Bilirubin: 0.3 mg/dL (ref 0.3–1.2)
Total Protein: 6.6 g/dL (ref 6.5–8.1)

## 2020-05-06 LAB — MAGNESIUM: Magnesium: 1.9 mg/dL (ref 1.7–2.4)

## 2020-05-06 MED ORDER — SODIUM CHLORIDE 0.9 % IV SOLN
200.0000 mg | Freq: Once | INTRAVENOUS | Status: AC
  Administered 2020-05-06: 200 mg via INTRAVENOUS
  Filled 2020-05-06: qty 8

## 2020-05-06 MED ORDER — SODIUM CHLORIDE 0.9 % IV SOLN
510.0000 mg | Freq: Once | INTRAVENOUS | Status: AC
  Administered 2020-05-06: 510 mg via INTRAVENOUS
  Filled 2020-05-06: qty 510

## 2020-05-06 MED ORDER — SODIUM CHLORIDE 0.9 % IV SOLN: Freq: Once | INTRAVENOUS | Status: AC

## 2020-05-06 MED ORDER — HEPARIN SOD (PORK) LOCK FLUSH 100 UNIT/ML IV SOLN
500.0000 [IU] | Freq: Once | INTRAVENOUS | Status: AC | PRN
  Administered 2020-05-06: 500 [IU]

## 2020-05-06 MED ORDER — SODIUM CHLORIDE 0.9% FLUSH: 10.0000 mL | INTRAVENOUS | Status: DC | PRN

## 2020-05-06 NOTE — Patient Instructions (Signed)
[facility] Discharge Instructions for Patients Receiving Chemotherapy  Today you received the following chemotherapy agents   To help prevent nausea and vomiting after your treatment, we encourage you to take your nausea medication   If you develop nausea and vomiting that is not controlled by your nausea medication, call the clinic.   BELOW ARE SYMPTOMS THAT SHOULD BE REPORTED IMMEDIATELY:  *FEVER GREATER THAN 100.5 F  *CHILLS WITH OR WITHOUT FEVER  NAUSEA AND VOMITING THAT IS NOT CONTROLLED WITH YOUR NAUSEA MEDICATION  *UNUSUAL SHORTNESS OF BREATH  *UNUSUAL BRUISING OR BLEEDING  TENDERNESS IN MOUTH AND THROAT WITH OR WITHOUT PRESENCE OF ULCERS  *URINARY PROBLEMS  *BOWEL PROBLEMS  UNUSUAL RASH Items with * indicate a potential emergency and should be followed up as soon as possible.  Feel free to call the clinic should you have any questions or concerns. The clinic phone number is [phone].  Please show the CHEMO ALERT CARD at check-in to the Emergency Department and triage nurse.   

## 2020-05-06 NOTE — Progress Notes (Signed)
Patient seen by [facility] today. Vital signs stable. Patient has no complaints of any changes since her last visit. MAR reviewed. Labs within parameters for treatment.   Treatment given today per MD orders. Tolerated infusion without adverse affects. Vital signs stable. No complaints at this time. Discharged from clinic ambulatory. F/U with [facility] as scheduled.

## 2020-05-06 NOTE — Progress Notes (Signed)
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  Pathology did not show any residual melanoma. -CT chest and abdomen on 03/21/2020 did not show any evidence of metastatic disease. -Adjuvant immunotherapy with pembrolizumab 1 year recommended. -Pembrolizumab started on 03/25/2020. AND Iron deficiency anemia  CURRENT STATUS: Donna Munoz 44 y.o. female returns for followup of stage III melanoma undergoing adjuvant immunotherapy to reduce the risk of recurrence with curative intent and tolerating well.  She denies any new lumps or bumps on her examination.  No new pain.  Appetite is good but weight is slowly declining.  No need to monitor this moving forward.  She denies abdominal bloating or discomfort.  No nausea or vomiting.  She denies any hyperimmune toxicity including, but not limited to, diarrhea, constipation, skin color change, pruritus, rash, abdominal pain, cough or shortness of breath, changes in vision, severe fatigue, mental status change.  She continues to have monthly menstrual cycles and I suspect this is contributing to her anemia.  Her last menstrual cycle was 3 weeks ago.  She continues to smoke 1.5 packs/day of cigarettes.  She did try Chantix recently and has self discontinued due to severe nightmares.  Review of Systems  Constitutional: Negative.  Negative for chills, fever and weight loss.  HENT: Negative.   Eyes: Negative.    Respiratory: Negative.  Negative for cough.   Cardiovascular: Negative.  Negative for chest pain.  Gastrointestinal: Negative.  Negative for blood in stool, constipation, diarrhea, melena, nausea and vomiting.  Genitourinary: Negative.   Musculoskeletal: Negative.   Skin: Negative.   Neurological: Negative.  Negative for weakness.  Endo/Heme/Allergies: Negative.   Psychiatric/Behavioral: Negative.     Past Medical History:  Diagnosis Date  . Anemia   . Anxiety   . Depression   . GERD (gastroesophageal reflux disease)   . Headache   . Shingles   . Tobacco abuse 05/06/2020     PHYSICAL EXAMINATION  ECOG PERFORMANCE STATUS: 0 - Asymptomatic  Vitals:   05/06/20 1114  BP: (!) 94/55  Pulse: 72  Resp: 18  Temp: 98.2 F (36.8 C)  SpO2: 99%    GENERAL:alert, no distress, well nourished, well developed, comfortable, cooperative, smiling and unaccompanied SKIN: skin color, texture, turgor are normal, no rashes or significant lesions HEAD: Normocephalic, No masses, lesions, tenderness or abnormalities EYES: EOMI, Conjunctiva are pink and non-injected EARS: External ears normal OROPHARYNX: Not examined, mask in place NECK: supple, no adenopathy LYMPH:  no palpable lymphadenopathy BREAST:not examined LUNGS: clear to auscultation  HEART: regular rate & rhythm, no murmurs and no gallops ABDOMEN:abdomen soft, non-tender, normal bowel sounds and no masses or organomegaly BACK: Back symmetric, no curvature. EXTREMITIES:less then 2 second capillary refill, no joint deformities, effusion, or inflammation, no edema, no skin discoloration, no cyanosis  NEURO: alert & oriented x 3 with fluent speech, no focal motor/sensory deficits, gait normal   LABORATORY DATA: CBC    Component Value Date/Time   WBC 4.3 05/06/2020  1056   RBC 4.26 05/06/2020 1056   HGB 10.8 (L) 05/06/2020 1056   HGB 12.5 08/29/2007 1545   HCT 36.0 05/06/2020 1056   HCT 37.0 08/29/2007 1545   PLT 226  05/06/2020 1056   PLT 277 08/29/2007 1545   MCV 84.5 05/06/2020 1056   MCV 84.5 08/29/2007 1545   MCH 25.4 (L) 05/06/2020 1056   MCHC 30.0 05/06/2020 1056   RDW 25.3 (H) 05/06/2020 1056   RDW 16.2 (H) 08/29/2007 1545   LYMPHSABS 1.3 05/06/2020 1056   LYMPHSABS 2.3 08/29/2007 1545   MONOABS 0.3 05/06/2020 1056   MONOABS 0.3 08/29/2007 1545   EOSABS 0.1 05/06/2020 1056   EOSABS 0.2 08/29/2007 1545   BASOSABS 0.0 05/06/2020 1056   BASOSABS 0.0 08/29/2007 1545      Chemistry      Component Value Date/Time   NA 138 05/06/2020 1056   K 4.1 05/06/2020 1056   CL 103 05/06/2020 1056   CO2 27 05/06/2020 1056   BUN 10 05/06/2020 1056   CREATININE 0.55 05/06/2020 1056      Component Value Date/Time   CALCIUM 9.9 05/06/2020 1056   ALKPHOS 75 05/06/2020 1056   AST 28 05/06/2020 1056   ALT 31 05/06/2020 1056   BILITOT 0.3 05/06/2020 1056       RADIOGRAPHIC STUDIES:  [place] FNA BX THYROID 1ST LESION AFIRMA  Result Date: 05/01/2020 INDICATION: Indeterminate thyroid nodule of right mid thyroid. Request is made for fine-needle aspiration of indeterminate thyroid nodule. EXAM: ULTRASOUND GUIDED FINE NEEDLE ASPIRATION OF INDETERMINATE THYROID NODULE COMPARISON:  US THYROID 02/05/2020 MEDICATIONS: 1 mL 1% lidocaine COMPLICATIONS: None immediate. TECHNIQUE: Informed written consent was obtained from the patient after a discussion of the risks, benefits and alternatives to treatment. Questions regarding the procedure were encouraged and answered. A timeout was performed prior to the initiation of the procedure. Pre-procedural ultrasound scanning demonstrated unchanged size of the indeterminate nodule, however now with a hypoechoic appearance within the right mid thyroid. The procedure was planned. The neck was prepped in the usual sterile fashion, and a sterile drape was applied covering the operative field. A timeout was performed prior to the initiation of the procedure. Local anesthesia was provided  with 1% lidocaine. Under direct ultrasound guidance, 5 FNA biopsies were performed of the right mid thyroid nodule with a 25 gauge needle. Multiple ultrasound images were saved for procedural documentation purposes. The samples were prepared and submitted to pathology. Sample was also prepared for Afirma testing. Limited post procedural scanning was negative for hematoma or additional complication. Dressings were placed. The patient tolerated the above procedures procedure well without immediate postprocedural complication. FINDINGS: Nodule reference number based on prior diagnostic ultrasound: 1 Maximum size: 0.6 cm Location: Right; Mid ACR TI-RADS risk category: TR4 (4-6 points) Reason for biopsy: patient/referrer request Ultrasound imaging confirms appropriate placement of the needles within the thyroid nodule. IMPRESSION: Technically successful ultrasound guided fine needle aspiration of right mid thyroid nodule which now appears hyperechoic on ultrasound today. Read by: Brynda Greathouse PA-C Electronically Signed   By: Aletta Edouard M.D.   On: 05/01/2020 17:02       ASSESSMENT AND PLAN:  1. Melanoma of skin ([place]) Stage IIIb (T2B N2A) malignant melanoma of the right third toe, BRAF V6 100 E negative: -Resection of the primary with sentinel lymph node biopsy on 01/30/2020, positive margin, pathology with three lymph nodes positive for micrometastasis. 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  Pathology did not show any residual melanoma. -CT chest and abdomen on 03/21/2020 did not show any evidence of metastatic disease. -Adjuvant immunotherapy with pembrolizumab 1 year recommended. -Pembrolizumab started on 03/25/2020.  Labs updated today: White blood cell count 4.3 with normal differential, improving anemia at 10.8 g/dL, and platelet count 226K.  Metabolic panel is within normal limits and magnesium is normal as well.  Labs satisfy treatment  parameters.  Today is D1C3 of [place].  Nursing, in accordance with immunotherapy administration protocol, will monitor for acute side effects/toxicities associated with immunotherapy administration today.   All documentation and imaging regarding malignancy are reviewed.  Return in 3 weeks for follow-up and ongoing treatment x 1 year.  2. Iron deficiency anemia due to chronic blood loss Hemoglobin slowly improving we will check iron studies in 3 weeks.  She continues to have mitral cycles with her last menstrual cycle being approximately 3 weeks ago.  This is likely the cause of her anemia.  3.  Family history: -Mother died of throat cancer.  Maternal grandmother had breast cancer.  Maternal aunt had pancreatic cancer.  Maternal grandfather had bone cancer.  Maternal great grandmother died of melanoma. -She will benefit from genetic testing.  4. Tobacco abuse Still smoking 1.5 packs/day.  She was intolerant to Chantix therapy, complicated by nightmares.  She would be a candidate for Wellbutrin therapy, but I think failure rate will be very high given the fact that she continues smoke 1.5 packs/day.  I have encouraged her to self decrease her smoking and when she gets down to about a half a pack for 1/4 pack of cigarettes per day she will have a much higher success rate of quitting with the help of medication.  Nevertheless, I will defer this treatment to her primary oncologist/primary care provider.    ORDERS PLACED FOR THIS ENCOUNTER: Orders Placed This Encounter  Procedures  . CBC with Differential  . Comprehensive metabolic panel  . Magnesium  . Iron and TIBC  . Ferritin    MEDICATIONS PRESCRIBED THIS ENCOUNTER: No orders of the defined types were placed in this encounter.   All questions were answered. The patient knows to call the clinic with any problems, questions or concerns. We can certainly see the patient much sooner if necessary.  Patient and plan discussed with  Dr. Derek Jack and he is in agreement with the aforementioned.   This note is electronically signed by: Robynn Pane, PA-C 05/06/2020 12:38 PM

## 2020-05-06 NOTE — Patient Instructions (Signed)
[facility] at [facility] Discharge Instructions  You were seen today by Tom Kefalas PA. He went over your recent lab results. He will see you back in 3 weeks for labs, treatment and follow up.   Thank you for choosing [facility] at [facility] to provide your oncology and hematology care.  To afford each patient quality time with our provider, please arrive at least 15 minutes before your scheduled appointment time.   If you have a lab appointment with the Cancer Center please come in thru the  Main Entrance and check in at the main information desk  You need to re-schedule your appointment should you arrive 10 or more minutes late.  We strive to give you quality time with our providers, and arriving late affects you and other patients whose appointments are after yours.  Also, if you no show three or more times for appointments you may be dismissed from the clinic at the providers discretion.     Again, thank you for choosing [facility].  Our hope is that these requests will decrease the amount of time that you wait before being seen by our physicians.       _____________________________________________________________  Should you have questions after your visit to [facility], please contact our office at [phone] between the hours of 8:00 a.m. and 4:30 p.m.  Voicemails left after 4:00 p.m. will not be returned until the following business day.  For prescription refill requests, have your pharmacy contact our office and allow 72 hours.    Cancer Center Support Programs:   > Cancer Support Group  2nd Tuesday of the month 1pm-2pm, Journey Room    

## 2020-05-06 NOTE — Progress Notes (Signed)
Patient has been assessed, vital signs and labs have been reviewed by Tom Kefalas PA. ANC, Creatinine, LFTs, and Platelets are within treatment parameters per Tom Kefalas PA. The patient is good to proceed with treatment at this time.   

## 2020-05-07 MED ORDER — OCTREOTIDE ACETATE 30 MG IM KIT
PACK | INTRAMUSCULAR | Status: AC
  Filled 2020-05-07: qty 1

## 2020-05-12 ENCOUNTER — Other Ambulatory Visit (HOSPITAL_COMMUNITY): Payer: Self-pay | Admitting: Hematology

## 2020-05-22 ENCOUNTER — Encounter: Payer: Medicaid Other | Admitting: Obstetrics and Gynecology

## 2020-05-27 ENCOUNTER — Inpatient Hospital Stay (HOSPITAL_COMMUNITY): Payer: Medicaid Other | Attending: Hematology | Admitting: Hematology

## 2020-05-27 ENCOUNTER — Other Ambulatory Visit: Payer: Self-pay

## 2020-05-27 ENCOUNTER — Inpatient Hospital Stay (HOSPITAL_COMMUNITY): Payer: Medicaid Other

## 2020-05-27 VITALS — BP 89/58 | HR 71 | Temp 96.8°F | Resp 16

## 2020-05-27 VITALS — BP 88/53 | HR 66 | Temp 97.7°F | Resp 18

## 2020-05-27 DIAGNOSIS — D509 Iron deficiency anemia, unspecified: Secondary | ICD-10-CM | POA: Insufficient documentation

## 2020-05-27 DIAGNOSIS — C439 Malignant melanoma of skin, unspecified: Secondary | ICD-10-CM

## 2020-05-27 DIAGNOSIS — Z5111 Encounter for antineoplastic chemotherapy: Secondary | ICD-10-CM | POA: Insufficient documentation

## 2020-05-27 DIAGNOSIS — E041 Nontoxic single thyroid nodule: Secondary | ICD-10-CM | POA: Diagnosis not present

## 2020-05-27 DIAGNOSIS — C4371 Malignant melanoma of right lower limb, including hip: Secondary | ICD-10-CM | POA: Insufficient documentation

## 2020-05-27 DIAGNOSIS — Z79899 Other long term (current) drug therapy: Secondary | ICD-10-CM | POA: Insufficient documentation

## 2020-05-27 LAB — COMPREHENSIVE METABOLIC PANEL
ALT: 56 U/L — ABNORMAL HIGH (ref 0–44)
AST: 43 U/L — ABNORMAL HIGH (ref 15–41)
Albumin: 4.3 g/dL (ref 3.5–5.0)
Alkaline Phosphatase: 70 U/L (ref 38–126)
Anion gap: 8 (ref 5–15)
BUN: 8 mg/dL (ref 6–20)
CO2: 27 mmol/L (ref 22–32)
Calcium: 10 mg/dL (ref 8.9–10.3)
Chloride: 104 mmol/L (ref 98–111)
Creatinine, Ser: 0.63 mg/dL (ref 0.44–1.00)
GFR calc Af Amer: >60 mL/min (ref >60)
GFR calc non Af Amer: >60 mL/min (ref >60)
Glucose, Bld: 105 mg/dL — ABNORMAL HIGH (ref 70–99)
Potassium: 4.1 mmol/L (ref 3.5–5.1)
Sodium: 139 mmol/L (ref 135–145)
Total Bilirubin: 0.7 mg/dL (ref 0.3–1.2)
Total Protein: 7.3 g/dL (ref 6.5–8.1)

## 2020-05-27 LAB — MAGNESIUM: Magnesium: 1.9 mg/dL (ref 1.7–2.4)

## 2020-05-27 LAB — FERRITIN: Ferritin: 112 ng/mL (ref 11–307)

## 2020-05-27 LAB — IRON AND TIBC
Iron: 120 ug/dL (ref 28–170)
Saturation Ratios: 30 % (ref 10.4–31.8)
TIBC: 405 ug/dL (ref 250–450)
UIBC: 285 ug/dL

## 2020-05-27 LAB — CBC WITH DIFFERENTIAL/PLATELET
Abs Immature Granulocytes: 0.01 10*3/uL (ref 0.00–0.07)
Basophils Absolute: 0 10*3/uL (ref 0.0–0.1)
Basophils Relative: 1 %
Eosinophils Absolute: 0.1 10*3/uL (ref 0.0–0.5)
Eosinophils Relative: 2 %
HCT: 44 % (ref 36.0–46.0)
Hemoglobin: 13.7 g/dL (ref 12.0–15.0)
Immature Granulocytes: 0 %
Lymphocytes Relative: 28 %
Lymphs Abs: 1.2 10*3/uL (ref 0.7–4.0)
MCH: 27.5 pg (ref 26.0–34.0)
MCHC: 31.1 g/dL (ref 30.0–36.0)
MCV: 88.2 fL (ref 80.0–100.0)
Monocytes Absolute: 0.3 10*3/uL (ref 0.1–1.0)
Monocytes Relative: 6 %
Neutro Abs: 2.7 10*3/uL (ref 1.7–7.7)
Neutrophils Relative %: 63 %
Platelets: 242 10*3/uL (ref 150–400)
RBC: 4.99 MIL/uL (ref 3.87–5.11)
RDW: 24.3 % — ABNORMAL HIGH (ref 11.5–15.5)
WBC: 4.4 10*3/uL (ref 4.0–10.5)
nRBC: 0 % (ref 0.0–0.2)

## 2020-05-27 MED ORDER — SODIUM CHLORIDE 0.9% FLUSH
10.0000 mL | INTRAVENOUS | Status: DC | PRN
  Administered 2020-05-27: 10 mL

## 2020-05-27 MED ORDER — SODIUM CHLORIDE 0.9 % IV SOLN
200.0000 mg | Freq: Once | INTRAVENOUS | Status: AC
  Administered 2020-05-27: 200 mg via INTRAVENOUS
  Filled 2020-05-27: qty 8

## 2020-05-27 MED ORDER — SODIUM CHLORIDE 0.9 % IV SOLN: Freq: Once | INTRAVENOUS | Status: AC

## 2020-05-27 MED ORDER — HEPARIN SOD (PORK) LOCK FLUSH 100 UNIT/ML IV SOLN
500.0000 [IU] | Freq: Once | INTRAVENOUS | Status: AC | PRN
  Administered 2020-05-27: 500 [IU]

## 2020-05-27 NOTE — Progress Notes (Signed)
[place], [place] [address]   CLINIC:  Medical Oncology/Hematology  PCP:  Patient, No Pcp Per None None   REASON FOR VISIT:  Follow-up for malignant melanoma  PRIOR THERAPY: None  NGS Results: Not done  CURRENT THERAPY: Adjuvant Keytruda  BRIEF ONCOLOGIC HISTORY:  Oncology History  Melanoma of skin ([place])  12/11/2019 Initial Diagnosis   Melanoma of skin ([place])   02/19/2020 Genetic Testing   BRAF Mutation Analysis     03/25/2020 -  Chemotherapy   The patient had pembrolizumab (KEYTRUDA) 200 mg in sodium chloride 0.9 % 50 mL chemo infusion, 200 mg, Intravenous, Once, 3 of 6 cycles Administration: 200 mg (03/25/2020), 200 mg (05/06/2020), 200 mg (04/15/2020)  for chemotherapy treatment.      CANCER STAGING: Cancer Staging Melanoma of skin ([place]) Staging form: Melanoma of the Skin, AJCC 8th Edition - Clinical stage from 02/08/2020: Stage III (cT2b, cN2a, cM0) - Unsigned   INTERVAL HISTORY:  [name], a 44 y.o. female, returns for routine follow-up and consideration for next cycle of chemotherapy. Donna Munoz was last seen on 04/15/2020.  Due for cycle #4 of Keytruda today.   Overall, she tells me she has been feeling pretty well and better than before. Her energy has improved since her last iron infusion, though she still sleeps poorly at night. After her last visit on 04/15/2020, she started having her period and it last for 15 days; her periods are normally every 3 weeks and lasting 3-4 days. She has been taking extra strength Tylenol while menstruating. Her appetite is good.  She has an appointment with the OBGYN on 05/30/2020.  Overall, she feels ready for next cycle of chemo today.    REVIEW OF SYSTEMS:  Review of Systems  Constitutional: Positive for appetite change (moderately decreased) and fatigue (moderate).  Neurological: Positive for headaches.  Psychiatric/Behavioral: Positive for depression and sleep disturbance.  All  other systems reviewed and are negative.   PAST MEDICAL/SURGICAL HISTORY:  Past Medical History:  Diagnosis Date  . Anemia   . Anxiety   . Depression   . GERD (gastroesophageal reflux disease)   . Headache   . Shingles   . Tobacco abuse 05/06/2020   Past Surgical History:  Procedure Laterality Date  . AMPUTATION TOE Right 03/07/2020   Procedure: RIGHT THIRD TOE AMPUTATION;  Surgeon: [name], MD;  Location: [facility];  Service: General;  Laterality: Right;  . BREAST LUMPECTOMY  age 58  . CESAREAN SECTION  01/06/2006  . CESAREAN SECTION  10/15/2016  . DENTAL SURGERY  2019  . INGUINAL HERNIA REPAIR Right 01/30/2020   Procedure: RIGHT INGUINAL HERNIA REPAIR WITH  MESH;  Surgeon: [name], MD;  Location: [facility];  Service: General;  Laterality: Right;  . INSERTION OF MESH Right 01/30/2020   Procedure: INSERTION OF MESH;  Surgeon: [name], MD;  Location: [facility];  Service: General;  Laterality: Right;  . IRRIGATION AND DEBRIDEMENT ABSCESS Right 03/07/2020   Procedure: Aspiration of Right Groin Seroma;  Surgeon: [name], MD;  Location: [facility];  Service: General;  Laterality: Right;  . MELANOMA EXCISION WITH SENTINEL LYMPH NODE BIOPSY Right 01/30/2020   Procedure: WIDE LOWER EXCISION RIGHT THIRD TOE MELANOMA EXCISION WITH SENTINEL LYMPH NODE BIOPSY;  Surgeon: [name], MD;  Location: [facility];  Service: General;  Laterality: Right;  . PORTACATH PLACEMENT N/A 03/07/2020   Procedure: INSERTION PORT-A-CATH;  Surgeon: [name], MD;  [facility];  Service: General;  Laterality: N/A;    SOCIAL HISTORY:  Social History   Socioeconomic History  . Marital status: Legally Separated    Spouse name: Not on file  . Number of children: 2  . Years of education: Not on file  . Highest education level: Not on file  Occupational History  . Occupation: unemployed d/t COVID  Tobacco Use  . Smoking status: Current Every  Day Smoker    Packs/day: 1.00  . Smokeless tobacco: Never Used  Vaping Use  . Vaping Use: Never used  Substance and Sexual Activity  . Alcohol use: No  . Drug use: Yes    Types: Marijuana  . Sexual activity: Yes  Other Topics Concern  . Not on file  Social History Narrative  . Not on file   Social Determinants of Health   Financial Resource Strain: High Risk  . Difficulty of Paying Living Expenses: Hard  Food Insecurity: No Food Insecurity  . Worried About Charity fundraiser in the Last Year: Never true  . Ran Out of Food in the Last Year: Never true  Transportation Needs: No Transportation Needs  . Lack of Transportation (Medical): No  . Lack of Transportation (Non-Medical): No  Physical Activity: Inactive  . Days of Exercise per Week: 0 days  . Minutes of Exercise per Session: 0 min  Stress: Stress Concern Present  . Feeling of Stress : Very much  Social Connections: Socially Isolated  . Frequency of Communication with Friends and Family: Twice a week  . Frequency of Social Gatherings with Friends and Family: Never  . Attends Religious Services: 1 to 4 times per year  . Active Member of Clubs or Organizations: No  . Attends Archivist Meetings: Never  . Marital Status: Separated  Intimate Partner Violence: Not At Risk  . Fear of Current or Ex-Partner: No  . Emotionally Abused: No  . Physically Abused: No  . Sexually Abused: No    FAMILY HISTORY:  Family History  Problem Relation Age of Onset  . Heart disease Mother   . Cancer Mother   . Ovarian cysts Sister   . Healthy Brother   . Cancer Maternal Grandmother   . Heart attack Maternal Grandmother   . Cancer Maternal Grandfather   . Cancer Paternal Grandmother   . Healthy Sister   . Healthy Sister   . Psoriasis Daughter   . Healthy Son     CURRENT MEDICATIONS:  Current Outpatient Medications  Medication Sig Dispense Refill  . oxyCODONE (OXY IR/ROXICODONE) 5 MG immediate release tablet Take 1  tablet (5 mg total) by mouth every 6 (six) hours as needed for severe pain. 40 tablet 0  . Pembrolizumab (KEYTRUDA IV) Inject into the vein every 21 ( twenty-one) days.    Marland Kitchen lidocaine-prilocaine (EMLA) cream Apply a small amount to port a cath site (do not rub in) and cover with plastic wrap 1 hour prior to chemotherapy appointments (Patient not taking: Reported on 05/27/2020) 30 g 3  . ondansetron (ZOFRAN ODT) 4 MG disintegrating tablet Take 1 tablet (4 mg total) by mouth every 8 (eight) hours as needed for nausea or vomiting. (Patient not taking: Reported on 05/27/2020) 30 tablet 0   No current facility-administered medications for this visit.    ALLERGIES:  No Known Allergies  PHYSICAL EXAM:  Performance status (ECOG): 0 - Asymptomatic  Vitals:   05/27/20 1300  BP: (!) 89/58  Pulse: 71  Resp: 16  Temp: (!) 96.8 F (36 C)  SpO2: 100%   Wt Readings from Last 3 Encounters:  05/06/20 104 lb 1.6 oz (47.2 kg)  04/15/20 103 lb 3.2 oz (46.8 kg)  03/25/20 105 lb 3.2 oz (47.7 kg)   Physical Exam Vitals reviewed.  Constitutional:      Appearance: Normal appearance.  Cardiovascular:     Rate and Rhythm: Normal rate and regular rhythm.     Pulses: Normal pulses.     Heart sounds: Normal heart sounds.  Pulmonary:     Effort: Pulmonary effort is normal.     Breath sounds: Normal breath sounds.  Abdominal:     Palpations: Abdomen is soft. There is no hepatomegaly, splenomegaly or mass.     Tenderness: There is no abdominal tenderness.  Lymphadenopathy:     Cervical: No cervical adenopathy.     Upper Body:     Right upper body: No supraclavicular adenopathy.     Left upper body: No supraclavicular adenopathy.  Neurological:     General: No focal deficit present.     Mental Status: She is alert and oriented to person, place, and time.  Psychiatric:        Mood and Affect: Mood normal.        Behavior: Behavior normal.     LABORATORY DATA:  I have reviewed the labs as listed.    CBC Latest Ref Rng & Units 05/27/2020 05/06/2020 04/15/2020  WBC 4.0 - 10.5 K/uL 4.4 4.3 4.2  Hemoglobin 12.0 - 15.0 g/dL 13.7 10.8(L) 9.3(L)  Hematocrit 36 - 46 % 44.0 36.0 32.3(L)  Platelets 150 - 400 K/uL 242 226 247   CMP Latest Ref Rng & Units 05/27/2020 05/06/2020 04/15/2020  Glucose 70 - 99 mg/dL 105(H) 99 84  BUN 6 - 20 mg/dL 8 10 10   Creatinine 0.44 - 1.00 mg/dL 0.63 0.55 0.56  Sodium 135 - 145 mmol/L 139 138 136  Potassium 3.5 - 5.1 mmol/L 4.1 4.1 4.1  Chloride 98 - 111 mmol/L 104 103 103  CO2 22 - 32 mmol/L 27 27 24   Calcium 8.9 - 10.3 mg/dL 10.0 9.9 9.5  Total Protein 6.5 - 8.1 g/dL 7.3 6.6 7.2  Total Bilirubin 0.3 - 1.2 mg/dL 0.7 0.3 0.2(L)  Alkaline Phos 38 - 126 U/L 70 75 84  AST 15 - 41 U/L 43(H) 28 23  ALT 0 - 44 U/L 56(H) 31 17   Lab Results  Component Value Date   TIBC 405 05/27/2020   FERRITIN 112 05/27/2020   IRONPCTSAT 30 05/27/2020    DIAGNOSTIC IMAGING:  I have independently reviewed the scans and discussed with the patient. [place] FNA BX THYROID 1ST LESION AFIRMA  Result Date: 05/01/2020 INDICATION: Indeterminate thyroid nodule of right mid thyroid. Request is made for fine-needle aspiration of indeterminate thyroid nodule. EXAM: ULTRASOUND GUIDED FINE NEEDLE ASPIRATION OF INDETERMINATE THYROID NODULE COMPARISON:  US THYROID 02/05/2020 MEDICATIONS: 1 mL 1% lidocaine COMPLICATIONS: None immediate. TECHNIQUE: Informed written consent was obtained from the patient after a discussion of the risks, benefits and alternatives to treatment. Questions regarding the procedure were encouraged and answered. A timeout was performed prior to the initiation of the procedure. Pre-procedural ultrasound scanning demonstrated unchanged size of the indeterminate nodule, however now with a hypoechoic appearance within the right mid thyroid. The procedure was planned. The neck was prepped in the usual sterile fashion, and a sterile drape was applied covering the operative field. A timeout  was performed prior to the initiation of the  procedure. Local anesthesia was provided with 1% lidocaine. Under direct ultrasound guidance, 5 FNA biopsies were performed of the right mid thyroid nodule with a 25 gauge needle. Multiple ultrasound images were saved for procedural documentation purposes. The samples were prepared and submitted to pathology. Sample was also prepared for Afirma testing. Limited post procedural scanning was negative for hematoma or additional complication. Dressings were placed. The patient tolerated the above procedures procedure well without immediate postprocedural complication. FINDINGS: Nodule reference number based on prior diagnostic ultrasound: 1 Maximum size: 0.6 cm Location: Right; Mid ACR TI-RADS risk category: TR4 (4-6 points) Reason for biopsy: patient/referrer request Ultrasound imaging confirms appropriate placement of the needles within the thyroid nodule. IMPRESSION: Technically successful ultrasound guided fine needle aspiration of right mid thyroid nodule which now appears hyperechoic on ultrasound today. Read by: Brynda Greathouse PA-C Electronically Signed   By: Aletta Edouard M.D.   On: 05/01/2020 17:02     ASSESSMENT:  1. 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  Pathology did not show any residual melanoma. -CT chest and abdomen on 03/21/2020 did not show any evidence of metastatic disease. -Adjuvant immunotherapy with pembrolizumab 1 year recommended. -Pembrolizumab started on 03/25/2020.  2.  Family history: -Mother died of throat cancer.  Maternal grandmother had breast cancer.  Maternal aunt had pancreatic cancer.  Maternal grandfather had bone cancer.  Maternal great  grandmother died of melanoma. -She will benefit from genetic testing.  3.  Right thyroid nodule: -PET scan on 12/20/2019 showed uptake in the right thyroid. -Ultrasound on 02/05/2020 did not show any suspicious nodules. -FNA on 05/01/2020 showed benign follicular nodule, Bethesda category 2.   PLAN:  1. 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 -She reported that her menses start after each treatment with Keytruda.  Had normal menses is every 3 weeks.  I have referred her to GYN evaluation.  Typically immunotherapy is not known to cause excessive menstrual bleeding. -I have reviewed her labs today which showed normal CBC.  However AST and ALT are mildly elevated at 43 and 56 respectively.  Total bilirubin is normal.  We will closely monitor.  She will proceed with her treatment today.  We will see her back in 3 weeks with repeat CT CAP and labs.  2. Microcytic anemia: -She received Feraheme on 04/15/2020 and 05/06/2020.  Her energy levels improved. -Hemoglobin improved to 13.7.  Ferritin is 112 with saturation of 30.  3. Right thyroid nodule: -We reviewed pathology from recent FNA which was benign.   Orders placed this encounter:  No orders of the defined types were placed in this encounter.    Derek Jack, MD [place] [phone]   I, Milinda Antis, am acting as a scribe for Dr. Sanda Linger.  I, Derek Jack MD, have reviewed the above documentation for accuracy and completeness, and I agree with the above.

## 2020-05-27 NOTE — Patient Instructions (Signed)
[facility] Discharge Instructions for Patients Receiving Chemotherapy   Beginning January 23rd 2017 lab work for the Cancer Center will be done in the  Main lab at [facility] on 1st floor. If you have a lab appointment with the Cancer Center please come in thru the  Main Entrance and check in at the main information desk   Today you received the following chemotherapy agents Keytruda. Follow-up as scheduled  To help prevent nausea and vomiting after your treatment, we encourage you to take your nausea medication   If you develop nausea and vomiting, or diarrhea that is not controlled by your medication, call the clinic.  The clinic phone number is [phone]. Office hours are Monday-Friday 8:30am-5:00pm.  BELOW ARE SYMPTOMS THAT SHOULD BE REPORTED IMMEDIATELY:  *FEVER GREATER THAN 101.0 F  *CHILLS WITH OR WITHOUT FEVER  NAUSEA AND VOMITING THAT IS NOT CONTROLLED WITH YOUR NAUSEA MEDICATION  *UNUSUAL SHORTNESS OF BREATH  *UNUSUAL BRUISING OR BLEEDING  TENDERNESS IN MOUTH AND THROAT WITH OR WITHOUT PRESENCE OF ULCERS  *URINARY PROBLEMS  *BOWEL PROBLEMS  UNUSUAL RASH Items with * indicate a potential emergency and should be followed up as soon as possible. If you have an emergency after office hours please contact your primary care physician or go to the nearest emergency department.  Please call the clinic during office hours if you have any questions or concerns.   You may also contact the Patient Navigator at [phone] should you have any questions or need assistance in obtaining follow up care.      Resources For Cancer Patients and their Caregivers ? American Cancer Society: Can assist with transportation, wigs, general needs, runs Look Good Feel Better.        1-888-227-6333 ? Cancer Care: Provides financial assistance, online support groups, medication/co-pay assistance.  1-800-813-HOPE (4673) ? [facility] Assists [place] cancer patients and their families through emotional , educational and financial support.  336-427-4357 ? [place] DSS Where to apply for food stamps, Medicaid and utility assistance. 336-342-1394 ? [facility]: Transportation to medical appointments. 336-347-2287 ? Social Security Administration: May apply for disability if have a Stage IV cancer. 336-342-7796 1-800-772-1213 ? [place] Aging, Disability and Transit Services: Assists with nutrition, care and transit needs. 336-349-2343         

## 2020-05-27 NOTE — Patient Instructions (Signed)
[place] at [facility] Discharge Instructions  You were seen today by Dr. Delton Coombes. He went over your recent results. You received treatment today. Buy melatonin over the counter and take at night as needed for your sleep. You will be scheduled for a CT scan of your chest and abdomen. Dr. Delton Coombes will see you back in 3 weeks for labs and follow up.   Thank you for choosing [place] at [facility] to provide your oncology and hematology care.  To afford each patient quality time with our provider, please arrive at least 15 minutes before your scheduled appointment time.   If you have a lab appointment with the [facility] please come in thru the Main Entrance and check in at the main information desk  You need to re-schedule your appointment should you arrive 10 or more minutes late.  We strive to give you quality time with our providers, and arriving late affects you and other patients whose appointments are after yours.  Also, if you no show three or more times for appointments you may be dismissed from the clinic at the providers discretion.     Again, thank you for choosing [facility].  Our hope is that these requests will decrease the amount of time that you wait before being seen by our physicians.       _____________________________________________________________  Should you have questions after your visit to [facility], please contact our office at [phone] between the hours of 8:00 a.m. and 4:30 p.m.  Voicemails left after 4:00 p.m. will not be returned until the following business day.  For prescription refill requests, have your pharmacy contact our office and allow 72 hours.    Cancer Center Support Programs:   > Cancer Support Group  2nd Tuesday of the month 1pm-2pm, Journey Room

## 2020-05-27 NOTE — Progress Notes (Signed)
1350 Labs reviewed with and pt seen by Dr. Delton Coombes and pt approved for Keytruda infusion today per MD                                 Donna Munoz tolerated Keytruda infusion well without complaints or incident. VSS upon discharge. Pt discharged self ambulatory in satisfactory condition

## 2020-05-27 NOTE — Progress Notes (Signed)
Patient has been assessed, vital signs and labs have been reviewed by Dr. Katragadda. ANC, Creatinine, LFTs, and Platelets are within treatment parameters per Dr. Katragadda. The patient is good to proceed with treatment at this time.  

## 2020-05-29 ENCOUNTER — Other Ambulatory Visit: Payer: Self-pay | Admitting: General Surgery

## 2020-05-29 DIAGNOSIS — C4371 Malignant melanoma of right lower limb, including hip: Secondary | ICD-10-CM

## 2020-05-30 ENCOUNTER — Encounter: Payer: Medicaid Other | Admitting: Obstetrics and Gynecology

## 2020-05-30 ENCOUNTER — Telehealth: Payer: Self-pay | Admitting: Obstetrics and Gynecology

## 2020-05-30 NOTE — Telephone Encounter (Signed)
Pt with hx of anemia, and irregular bleeding with 17 day cycle last month. Pt has had iron infusions x 2 with last 2 immunotherapies, and both times came home with vaginal bleeding at the same time as the immunotherapy.  I told her I think this is coincidence.  Pt also noting hair loss with her immunotherapy but she was told that would not happen Patient encouraged to make a follow-up visit in the near future so that we can assess menstrual blood loss in the future of her chronic anemia abnormalities and irregular bleeding

## 2020-05-31 ENCOUNTER — Ambulatory Visit (HOSPITAL_COMMUNITY): Payer: Medicaid Other

## 2020-05-31 NOTE — Progress Notes (Signed)
Nutrition Follow-up:  Patient with melanoma of 3rd toe.  S/p toe amputation on 03/07/2020.  Patient receiving pembrolizumab.    Spoke with patient via phone for nutrition follow-up.  Patient reports appetite is better.  Has been drinking 4-6 carnation instant breakfast shakes per day (pre-made, ~240 calories).  Reports that she drinks one for breakfast and has golden graham cereal bar.  Lunch yesterday was cheese burger and fries from [employer].  Supper was steak, macaroni and cheese, mashed potatoes and crossiant roll.      Medications: reviewed  Labs: reviewed  Anthropometrics:   Weight 104 lb on 6/28 increased from 103 lb on 6/7 105 lb on 5/17   NUTRITION DIAGNOSIS: Inadequate oral intake improving   INTERVENTION:  Patient to continue oral nutrition supplements QID (960 calories) with solid foods to increase calories and protein.  Patient has contact information     MONITORING, EVALUATION, GOAL: weight trends, intake   NEXT VISIT: August 27 phone f/u  Donna Munoz, [place], [place] Registered Dietitian [phone] (mobile)

## 2020-06-12 ENCOUNTER — Ambulatory Visit (HOSPITAL_COMMUNITY)
Admission: RE | Admit: 2020-06-12 | Discharge: 2020-06-12 | Disposition: A | Discharge status: Home / Self Care | Payer: Medicaid Other | Source: Ambulatory Visit | Attending: Hematology | Admitting: Hematology

## 2020-06-12 ENCOUNTER — Other Ambulatory Visit: Payer: Self-pay

## 2020-06-12 DIAGNOSIS — C439 Malignant melanoma of skin, unspecified: Secondary | ICD-10-CM | POA: Diagnosis not present

## 2020-06-12 MED ORDER — IOHEXOL 300 MG/ML  SOLN
100.0000 mL | Freq: Once | INTRAMUSCULAR | Status: AC | PRN
  Administered 2020-06-12: 75 mL via INTRAVENOUS

## 2020-06-13 ENCOUNTER — Ambulatory Visit
Admission: RE | Admit: 2020-06-13 | Discharge: 2020-06-13 | Disposition: A | Discharge status: Home / Self Care | Payer: Medicaid Other | Source: Ambulatory Visit | Attending: General Surgery | Admitting: General Surgery

## 2020-06-13 DIAGNOSIS — Z85820 Personal history of malignant melanoma of skin: Secondary | ICD-10-CM | POA: Diagnosis not present

## 2020-06-13 DIAGNOSIS — Z89421 Acquired absence of other right toe(s): Secondary | ICD-10-CM | POA: Diagnosis not present

## 2020-06-13 DIAGNOSIS — C4371 Malignant melanoma of right lower limb, including hip: Secondary | ICD-10-CM

## 2020-06-13 DIAGNOSIS — Z89422 Acquired absence of other left toe(s): Secondary | ICD-10-CM | POA: Diagnosis not present

## 2020-06-13 DIAGNOSIS — M7989 Other specified soft tissue disorders: Secondary | ICD-10-CM | POA: Diagnosis not present

## 2020-06-13 MED ORDER — IOPAMIDOL (ISOVUE-300) INJECTION 61%
100.0000 mL | Freq: Once | INTRAVENOUS | Status: AC | PRN
  Administered 2020-06-13: 100 mL via INTRAVENOUS

## 2020-06-17 ENCOUNTER — Inpatient Hospital Stay (HOSPITAL_COMMUNITY): Payer: Medicaid Other | Attending: Hematology

## 2020-06-17 ENCOUNTER — Inpatient Hospital Stay (HOSPITAL_COMMUNITY): Payer: Medicaid Other

## 2020-06-17 ENCOUNTER — Other Ambulatory Visit: Payer: Self-pay

## 2020-06-17 ENCOUNTER — Inpatient Hospital Stay (HOSPITAL_BASED_OUTPATIENT_CLINIC_OR_DEPARTMENT_OTHER): Payer: Medicaid Other | Admitting: Hematology

## 2020-06-17 VITALS — BP 91/62 | HR 55 | Temp 96.9°F | Resp 16

## 2020-06-17 VITALS — BP 93/63 | HR 58 | Temp 97.5°F | Resp 18 | Wt 110.7 lb

## 2020-06-17 DIAGNOSIS — D509 Iron deficiency anemia, unspecified: Secondary | ICD-10-CM | POA: Insufficient documentation

## 2020-06-17 DIAGNOSIS — C439 Malignant melanoma of skin, unspecified: Secondary | ICD-10-CM | POA: Diagnosis not present

## 2020-06-17 DIAGNOSIS — Z5111 Encounter for antineoplastic chemotherapy: Secondary | ICD-10-CM | POA: Diagnosis present

## 2020-06-17 DIAGNOSIS — C4371 Malignant melanoma of right lower limb, including hip: Secondary | ICD-10-CM | POA: Diagnosis present

## 2020-06-17 DIAGNOSIS — Z89421 Acquired absence of other right toe(s): Secondary | ICD-10-CM | POA: Insufficient documentation

## 2020-06-17 DIAGNOSIS — Z79899 Other long term (current) drug therapy: Secondary | ICD-10-CM | POA: Diagnosis not present

## 2020-06-17 DIAGNOSIS — E041 Nontoxic single thyroid nodule: Secondary | ICD-10-CM | POA: Diagnosis not present

## 2020-06-17 LAB — COMPREHENSIVE METABOLIC PANEL
ALT: 21 U/L (ref 0–44)
AST: 23 U/L (ref 15–41)
Albumin: 4 g/dL (ref 3.5–5.0)
Alkaline Phosphatase: 68 U/L (ref 38–126)
Anion gap: 7 (ref 5–15)
BUN: 10 mg/dL (ref 6–20)
CO2: 24 mmol/L (ref 22–32)
Calcium: 9.4 mg/dL (ref 8.9–10.3)
Chloride: 105 mmol/L (ref 98–111)
Creatinine, Ser: 0.65 mg/dL (ref 0.44–1.00)
GFR calc Af Amer: >60 mL/min (ref >60)
GFR calc non Af Amer: >60 mL/min (ref >60)
Glucose, Bld: 85 mg/dL (ref 70–99)
Potassium: 4 mmol/L (ref 3.5–5.1)
Sodium: 136 mmol/L (ref 135–145)
Total Bilirubin: 0.2 mg/dL — ABNORMAL LOW (ref 0.3–1.2)
Total Protein: 6.9 g/dL (ref 6.5–8.1)

## 2020-06-17 LAB — CBC WITH DIFFERENTIAL/PLATELET
Abs Immature Granulocytes: 0.01 10*3/uL (ref 0.00–0.07)
Basophils Absolute: 0 10*3/uL (ref 0.0–0.1)
Basophils Relative: 1 %
Eosinophils Absolute: 0.2 10*3/uL (ref 0.0–0.5)
Eosinophils Relative: 4 %
HCT: 40.3 % (ref 36.0–46.0)
Hemoglobin: 12.7 g/dL (ref 12.0–15.0)
Immature Granulocytes: 0 %
Lymphocytes Relative: 30 %
Lymphs Abs: 1.5 10*3/uL (ref 0.7–4.0)
MCH: 28.2 pg (ref 26.0–34.0)
MCHC: 31.5 g/dL (ref 30.0–36.0)
MCV: 89.6 fL (ref 80.0–100.0)
Monocytes Absolute: 0.2 10*3/uL (ref 0.1–1.0)
Monocytes Relative: 5 %
Neutro Abs: 2.9 10*3/uL (ref 1.7–7.7)
Neutrophils Relative %: 60 %
Platelets: 207 10*3/uL (ref 150–400)
RBC: 4.5 MIL/uL (ref 3.87–5.11)
RDW: 20.4 % — ABNORMAL HIGH (ref 11.5–15.5)
WBC: 4.8 10*3/uL (ref 4.0–10.5)
nRBC: 0 % (ref 0.0–0.2)

## 2020-06-17 LAB — T4, FREE: Free T4: 0.63 ng/dL (ref 0.61–1.12)

## 2020-06-17 LAB — TSH: TSH: 35.43 u[IU]/mL — ABNORMAL HIGH (ref 0.350–4.500)

## 2020-06-17 MED ORDER — SODIUM CHLORIDE 0.9% FLUSH
10.0000 mL | INTRAVENOUS | Status: DC | PRN
  Administered 2020-06-17: 10 mL

## 2020-06-17 MED ORDER — HEPARIN SOD (PORK) LOCK FLUSH 100 UNIT/ML IV SOLN
500.0000 [IU] | Freq: Once | INTRAVENOUS | Status: AC | PRN
  Administered 2020-06-17: 500 [IU]

## 2020-06-17 MED ORDER — SODIUM CHLORIDE 0.9 % IV SOLN
200.0000 mg | Freq: Once | INTRAVENOUS | Status: AC
  Administered 2020-06-17: 200 mg via INTRAVENOUS
  Filled 2020-06-17: qty 8

## 2020-06-17 MED ORDER — SODIUM CHLORIDE 0.9 % IV SOLN: Freq: Once | INTRAVENOUS | Status: AC

## 2020-06-17 NOTE — Progress Notes (Signed)
Donna Munoz, Donna Munoz [id number]   CLINIC:  Medical Oncology/Hematology  PCP:  Patient, No Pcp Per None None   REASON FOR VISIT:  Follow-up for malignant melanoma  PRIOR THERAPY: None  NGS Results: BRAF V600E and V600K negative  CURRENT THERAPY: Adjuvant Keytruda  BRIEF ONCOLOGIC HISTORY:  Oncology History  Melanoma of skin ([place])  12/11/2019 Initial Diagnosis   Melanoma of skin ([place])   02/19/2020 Genetic Testing   BRAF Mutation Analysis     03/25/2020 -  Chemotherapy   The patient had pembrolizumab (KEYTRUDA) 200 mg in sodium chloride 0.9 % 50 mL chemo infusion, 200 mg, Intravenous, Once, 4 of 10 cycles Administration: 200 mg (03/25/2020), 200 mg (05/06/2020), 200 mg (05/27/2020), 200 mg (04/15/2020)  for chemotherapy treatment.      CANCER STAGING: Cancer Staging Melanoma of skin ([place]) Staging form: Melanoma of the Skin, AJCC 8th Edition - Clinical stage from 02/08/2020: Stage III (cT2b, cN2a, cM0) - Unsigned   INTERVAL HISTORY:  [name], a 44 y.o. female, returns for routine follow-up and consideration for next cycle of chemotherapy. Donna Munoz was last seen on 05/27/2020.  Due for cycle #5 of Keytruda today.   Overall, she tells me she has been feeling pretty well. She denies menstrual bleeding during and after the last treatment and her hair is not falling out as much. She denies having any rash. Her appetite is Donna.  Overall, she feels ready for next cycle of chemo today.    REVIEW OF SYSTEMS:  Review of Systems  Constitutional: Negative for appetite change and fatigue.  Respiratory: Positive for cough (d/t smoking) and shortness of breath.   Cardiovascular: Positive for chest pain (chest heaviness).  Gastrointestinal: Positive for nausea.  Skin: Negative for rash.  Neurological: Positive for dizziness.  All other systems reviewed and are negative.   PAST MEDICAL/SURGICAL HISTORY:  Past Medical History:  Diagnosis  Date  . Anemia   . Anxiety   . Depression   . GERD (gastroesophageal reflux disease)   . Headache   . Shingles   . Tobacco abuse 05/06/2020   Past Surgical History:  Procedure Laterality Date  . AMPUTATION TOE Right 03/07/2020   Procedure: RIGHT THIRD TOE AMPUTATION;  Surgeon: [name], MD;  Location: [facility];  Service: General;  Laterality: Right;  . BREAST LUMPECTOMY  age 4  . CESAREAN SECTION  01/06/2006  . CESAREAN SECTION  10/15/2016  . DENTAL SURGERY  2019  . INGUINAL HERNIA REPAIR Right 01/30/2020   Procedure: RIGHT INGUINAL HERNIA REPAIR WITH  MESH;  Surgeon: [name], MD;  Location: [facility];  Service: General;  Laterality: Right;  . INSERTION OF MESH Right 01/30/2020   Procedure: INSERTION OF MESH;  Surgeon: [name], MD;  Location: [facility];  Service: General;  Laterality: Right;  . IRRIGATION AND DEBRIDEMENT ABSCESS Right 03/07/2020   Procedure: Aspiration of Right Groin Seroma;  Surgeon: [name], MD;  Location: [facility];  Service: General;  Laterality: Right;  . MELANOMA EXCISION WITH SENTINEL LYMPH NODE BIOPSY Right 01/30/2020   Procedure: WIDE LOWER EXCISION RIGHT THIRD TOE MELANOMA EXCISION WITH SENTINEL LYMPH NODE BIOPSY;  Surgeon: [name], MD;  Location: [facility];  Service: General;  Laterality: Right;  . PORTACATH PLACEMENT N/A 03/07/2020   Procedure: INSERTION PORT-A-CATH;  Surgeon: [name], MD;  Location: [facility];  Service: General;  Laterality: N/A;    SOCIAL HISTORY:  Social History  Socioeconomic History  . Marital status: Legally Separated    Spouse name: Not on file  . Number of children: 2  . Years of education: Not on file  . Highest education level: Not on file  Occupational History  . Occupation: unemployed d/t COVID  Tobacco Use  . Smoking status: Current Every Day Smoker    Packs/day: 1.00  . Smokeless tobacco: Never Used  Vaping Use  . Vaping Use: Never used    Substance and Sexual Activity  . Alcohol use: No  . Drug use: Yes    Types: Marijuana  . Sexual activity: Yes  Other Topics Concern  . Not on file  Social History Narrative  . Not on file   Social Determinants of Health   Financial Resource Strain: High Risk  . Difficulty of Paying Living Expenses: Hard  Food Insecurity: No Food Insecurity  . Worried About Charity fundraiser in the Last Year: Never true  . Ran Out of Food in the Last Year: Never true  Transportation Needs: No Transportation Needs  . Lack of Transportation (Medical): No  . Lack of Transportation (Non-Medical): No  Physical Activity: Inactive  . Days of Exercise per Week: 0 days  . Minutes of Exercise per Session: 0 min  Stress: Stress Concern Present  . Feeling of Stress : Very much  Social Connections: Socially Isolated  . Frequency of Communication with Friends and Family: Twice a week  . Frequency of Social Gatherings with Friends and Family: Never  . Attends Religious Services: 1 to 4 times per year  . Active Member of Clubs or Organizations: No  . Attends Archivist Meetings: Never  . Marital Status: Separated  Intimate Partner Violence: Not At Risk  . Fear of Current or Ex-Partner: No  . Emotionally Abused: No  . Physically Abused: No  . Sexually Abused: No    FAMILY HISTORY:  Family History  Problem Relation Age of Onset  . Heart disease Mother   . Cancer Mother   . Ovarian cysts Sister   . Healthy Brother   . Cancer Maternal Grandmother   . Heart attack Maternal Grandmother   . Cancer Maternal Grandfather   . Cancer Paternal Grandmother   . Healthy Sister   . Healthy Sister   . Psoriasis Daughter   . Healthy Son     CURRENT MEDICATIONS:  Current Outpatient Medications  Medication Sig Dispense Refill  . lidocaine-prilocaine (EMLA) cream Apply a small amount to port a cath site (do not rub in) and cover with plastic wrap 1 hour prior to chemotherapy appointments 30 g 3  .  ondansetron (ZOFRAN ODT) 4 MG disintegrating tablet Take 1 tablet (4 mg total) by mouth every 8 (eight) hours as needed for nausea or vomiting. 30 tablet 0  . oxyCODONE (OXY IR/ROXICODONE) 5 MG immediate release tablet Take 1 tablet (5 mg total) by mouth every 6 (six) hours as needed for severe pain. 40 tablet 0  . Pembrolizumab (KEYTRUDA IV) Inject into the vein every 21 ( twenty-one) days.     No current facility-administered medications for this visit.    ALLERGIES:  No Known Allergies  PHYSICAL EXAM:  Performance status (ECOG): 0 - Asymptomatic  Vitals:   06/17/20 1251  BP: 93/63  Pulse: (!) 58  Resp: 18  Temp: (!) 97.5 F (36.4 C)  SpO2: 100%   Wt Readings from Last 3 Encounters:  06/17/20 110 lb 11.2 oz (50.2 kg)  05/06/20 104 lb 1.6  oz (47.2 kg)  04/15/20 103 lb 3.2 oz (46.8 kg)   Physical Exam Vitals reviewed.  Constitutional:      Appearance: Normal appearance.  Cardiovascular:     Rate and Rhythm: Normal rate and regular rhythm.     Pulses: Normal pulses.     Heart sounds: Normal heart sounds.  Pulmonary:     Effort: Pulmonary effort is normal.     Breath sounds: Normal breath sounds.  Abdominal:     Palpations: Abdomen is soft.     Tenderness: There is no abdominal tenderness.  Musculoskeletal:     Right lower leg: No edema.     Left lower leg: No edema.  Neurological:     General: No focal deficit present.     Mental Status: She is alert and oriented to person, place, and time.  Psychiatric:        Mood and Affect: Mood normal.        Behavior: Behavior normal.     LABORATORY DATA:  I have reviewed the labs as listed.  CBC Latest Ref Rng & Units 06/17/2020 05/27/2020 05/06/2020  WBC 4.0 - 10.5 K/uL 4.8 4.4 4.3  Hemoglobin 12.0 - 15.0 g/dL 12.7 13.7 10.8(L)  Hematocrit 36 - 46 % 40.3 44.0 36.0  Platelets 150 - 400 K/uL 207 242 226   CMP Latest Ref Rng & Units 06/17/2020 05/27/2020 05/06/2020  Glucose 70 - 99 mg/dL 85 105(H) 99  BUN 6 - 20 mg/dL 10 8  10   Creatinine 0.44 - 1.00 mg/dL 0.65 0.63 0.55  Sodium 135 - 145 mmol/L 136 139 138  Potassium 3.5 - 5.1 mmol/L 4.0 4.1 4.1  Chloride 98 - 111 mmol/L 105 104 103  CO2 22 - 32 mmol/L 24 27 27   Calcium 8.9 - 10.3 mg/dL 9.4 10.0 9.9  Total Protein 6.5 - 8.1 g/dL 6.9 7.3 6.6  Total Bilirubin 0.3 - 1.2 mg/dL 0.2(L) 0.7 0.3  Alkaline Phos 38 - 126 U/L 68 70 75  AST 15 - 41 U/L 23 43(H) 28  ALT 0 - 44 U/L 21 56(H) 31   Lab Results  Component Value Date   TIBC 405 05/27/2020   FERRITIN 112 05/27/2020   IRONPCTSAT 30 05/27/2020    DIAGNOSTIC IMAGING:  I have independently reviewed the scans and discussed with the patient. CT Chest W Contrast  Result Date: 06/12/2020 CLINICAL DATA:  Restaging melanoma. EXAM: CT CHEST, ABDOMEN, AND PELVIS WITH CONTRAST TECHNIQUE: Multidetector CT imaging of the chest, abdomen and pelvis was performed following the standard protocol during bolus administration of intravenous contrast. CONTRAST:  51m OMNIPAQUE IOHEXOL 300 MG/ML  SOLN COMPARISON:  03/21/2020 FINDINGS: CT CHEST FINDINGS Cardiovascular: The heart is normal in size. No pericardial effusion. The aorta is normal in caliber. No dissection. No atherosclerotic calcifications. The branch vessels are patent. No coronary artery calcifications. The pulmonary arteries appear normal. Mediastinum/Nodes: No mediastinal or hilar mass or adenopathy. Stable benign-appearing residual thymic tissue in the anterior mediastinum. The esophagus is grossly normal. Lungs/Pleura: No worrisome pulmonary lesions to suggest pulmonary metastatic disease. There are mild emphysematous changes again demonstrated with scattered tiny centrilobular nodules, likely respiratory bronchiolitis. No infiltrates or effusions. No pleural nodules. Musculoskeletal: No breast masses, supraclavicular or axillary adenopathy. The thyroid gland is normal. The bony thorax is intact. No worrisome bone lesions. CT ABDOMEN PELVIS FINDINGS Hepatobiliary: No  worrisome hepatic lesions to suggest metastatic disease. The portal and hepatic veins are patent. The gallbladder is unremarkable. No common bile duct dilatation.  Pancreas: No mass, inflammation or ductal dilatation. Spleen: Normal size.  No focal lesions. Adrenals/Urinary Tract: The adrenal glands and kidneys are unremarkable. The bladder is normal. Stomach/Bowel: The stomach, duodenum, small bowel and colon are unremarkable. No acute inflammatory changes, mass lesions or obstructive findings. Vascular/Lymphatic: The aorta is normal in caliber. No dissection. Distal aortic calcifications are noted. The branch vessels are patent. The major venous structures are patent. No mesenteric or retroperitoneal mass or adenopathy. Small scattered lymph nodes are noted. Reproductive: The uterus and ovaries are unremarkable. Simple appearing 3 cm cyst associated with the left ovary. Other: No pelvic mass or adenopathy. No free pelvic fluid collections. No inguinal mass or adenopathy. No abdominal wall hernia or subcutaneous lesions. Musculoskeletal: No significant bony findings. IMPRESSION: 1. No CT findings for metastatic disease involving the chest, abdomen or pelvis. 2. Mild emphysematous changes and respiratory bronchiolitis. 3. Emphysema and aortic atherosclerosis. Aortic Atherosclerosis (ICD10-I70.0) and Emphysema (ICD10-J43.9). Electronically Signed   By: Marijo Sanes M.D.   On: 06/12/2020 16:44   CT Abdomen Pelvis W Contrast  Result Date: 06/12/2020 CLINICAL DATA:  Restaging melanoma. EXAM: CT CHEST, ABDOMEN, AND PELVIS WITH CONTRAST TECHNIQUE: Multidetector CT imaging of the chest, abdomen and pelvis was performed following the standard protocol during bolus administration of intravenous contrast. CONTRAST:  83m OMNIPAQUE IOHEXOL 300 MG/ML  SOLN COMPARISON:  03/21/2020 FINDINGS: CT CHEST FINDINGS Cardiovascular: The heart is normal in size. No pericardial effusion. The aorta is normal in caliber. No dissection. No  atherosclerotic calcifications. The branch vessels are patent. No coronary artery calcifications. The pulmonary arteries appear normal. Mediastinum/Nodes: No mediastinal or hilar mass or adenopathy. Stable benign-appearing residual thymic tissue in the anterior mediastinum. The esophagus is grossly normal. Lungs/Pleura: No worrisome pulmonary lesions to suggest pulmonary metastatic disease. There are mild emphysematous changes again demonstrated with scattered tiny centrilobular nodules, likely respiratory bronchiolitis. No infiltrates or effusions. No pleural nodules. Musculoskeletal: No breast masses, supraclavicular or axillary adenopathy. The thyroid gland is normal. The bony thorax is intact. No worrisome bone lesions. CT ABDOMEN PELVIS FINDINGS Hepatobiliary: No worrisome hepatic lesions to suggest metastatic disease. The portal and hepatic veins are patent. The gallbladder is unremarkable. No common bile duct dilatation. Pancreas: No mass, inflammation or ductal dilatation. Spleen: Normal size.  No focal lesions. Adrenals/Urinary Tract: The adrenal glands and kidneys are unremarkable. The bladder is normal. Stomach/Bowel: The stomach, duodenum, small bowel and colon are unremarkable. No acute inflammatory changes, mass lesions or obstructive findings. Vascular/Lymphatic: The aorta is normal in caliber. No dissection. Distal aortic calcifications are noted. The branch vessels are patent. The major venous structures are patent. No mesenteric or retroperitoneal mass or adenopathy. Small scattered lymph nodes are noted. Reproductive: The uterus and ovaries are unremarkable. Simple appearing 3 cm cyst associated with the left ovary. Other: No pelvic mass or adenopathy. No free pelvic fluid collections. No inguinal mass or adenopathy. No abdominal wall hernia or subcutaneous lesions. Musculoskeletal: No significant bony findings. IMPRESSION: 1. No CT findings for metastatic disease involving the chest, abdomen or  pelvis. 2. Mild emphysematous changes and respiratory bronchiolitis. 3. Emphysema and aortic atherosclerosis. Aortic Atherosclerosis (ICD10-I70.0) and Emphysema (ICD10-J43.9). Electronically Signed   By: PMarijo SanesM.D.   On: 06/12/2020 16:44   CT FOOT RIGHT W CONTRAST  Result Date: 06/13/2020 CLINICAL DATA:  Patient status post amputation of the right third toe for melanoma 03/07/2020. Pain, redness and swelling at the amputation site. EXAM: CT OF THE LOWER RIGHT EXTREMITY WITH CONTRAST TECHNIQUE: Multidetector CT  imaging of the lower right extremity was performed according to the standard protocol following intravenous contrast administration. COMPARISON:  None. CONTRAST:  100 mL ISOVUE-300 IOPAMIDOL (ISOVUE-300) INJECTION 61% FINDINGS: Bones/Joint/Cartilage The patient is status post amputation of the third toe at the level of the base of the proximal phalanx. No bony destructive change or periosteal reaction is identified. The amputation site is well corticated. Ligaments Suboptimally assessed by CT. Muscles and Tendons Intact and normal appearance. Soft tissues No fluid collection is identified. No soft tissue gas or radiopaque foreign body. IMPRESSION: Status post amputation at the level of the base of the proximal phalanx of the third toe. No acute abnormality. Negative for evidence of osteomyelitis, abscess, septic joint or myositis. Electronically Signed   By: Inge Rise M.D.   On: 06/13/2020 15:19     ASSESSMENT:  1. 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 Pathology did not show any residual melanoma. -CT chest and abdomen on 03/21/2020 did not show any evidence of metastatic disease. -Adjuvant  immunotherapy with pembrolizumab 1 year recommended. -Pembrolizumab started on 03/25/2020. -CT CAP on 06/12/2020 did not show any evidence of metastatic disease in the chest, abdomen or pelvis.  Mild emphysematous changes and respiratory bronchiolitis. -CT right foot with contrast on 06/13/2020 shows no evidence of osteomyelitis, abscess, septic arthritis or myositis.  2. Family history: -Mother died of throat cancer. Maternal grandmother had breast cancer. Maternal aunt had pancreatic cancer. Maternal grandfather had bone cancer. Maternal great grandmother died of melanoma. -She will benefit from genetic testing.  3.  Right thyroid nodule: -PET scan on 12/20/2019 showed uptake in the right thyroid. -Ultrasound on 02/05/2020 did not show any suspicious nodules. -FNA on 05/01/2020 showed benign follicular nodule, Bethesda category 2.   PLAN:  1. Stage IIIb (T2B N2A) malignant melanoma of the right third toe, BRAF V6 100 E-: -She was evaluated by GYN.  Her menstruation is normal at this time. -We reviewed her labs which showed normal LFTs.  I have discussed the results of the CT chest, abdomen and pelvis with her. -She will proceed with her next treatment today.  We will reevaluate her in 3 weeks.  2. Microcytic anemia: -She received Feraheme in the past. -Hemoglobin improved to 12.7.  No intervention necessary.  3.  Hypothyroidism: -Her TSH today is elevated at 35.  It was normal in May. -I will check T4 and T3 levels today.  We will also repeat TSH at next visit in 3 weeks.  If it worsens, will consider Synthroid.   Orders placed this encounter:  No orders of the defined types were placed in this encounter.    Derek Jack, MD [place] [facility] [phone]   I, Milinda Antis, am acting as a scribe for Dr. Sanda Linger.  I, Derek Jack MD, have reviewed the above documentation for accuracy and completeness, and I agree with the above.

## 2020-06-17 NOTE — Progress Notes (Signed)
Patient tolerated therapy with no complaints voiced.  Side effects with management reviewed with understanding verbalized.  Port site clean and dry with no bruising or swelling noted at site.  Good blood return noted before and after administration of therapy.  Band aid applied.  Patient left in satisfactory condition with VSS and no s/s of distress noted.  

## 2020-06-17 NOTE — Patient Instructions (Signed)
[place] at [facility] Discharge Instructions  You were seen today by Dr. Delton Coombes. He went over your recent results. You had blood drawn for further analysis. You received your treatment today. Dr. Delton Coombes will see you back in 3 weeks for labs and follow up.   Thank you for choosing [place] at [facility] to provide your oncology and hematology care.  To afford each patient quality time with our provider, please arrive at least 15 minutes before your scheduled appointment time.   If you have a lab appointment with the [facility] please come in thru the Main Entrance and check in at the main information desk  You need to re-schedule your appointment should you arrive 10 or more minutes late.  We strive to give you quality time with our providers, and arriving late affects you and other patients whose appointments are after yours.  Also, if you no show three or more times for appointments you may be dismissed from the clinic at the providers discretion.     Again, thank you for choosing [facility].  Our hope is that these requests will decrease the amount of time that you wait before being seen by our physicians.       _____________________________________________________________  Should you have questions after your visit to [facility], please contact our office at [phone] between the hours of 8:00 a.m. and 4:30 p.m.  Voicemails left after 4:00 p.m. will not be returned until the following business day.  For prescription refill requests, have your pharmacy contact our office and allow 72 hours.    Cancer Center Support Programs:   > Cancer Support Group  2nd Tuesday of the month 1pm-2pm, Journey Room

## 2020-06-17 NOTE — Progress Notes (Signed)
Patient has been assessed by Dr. Delton Coombes, labs reviewed, Thyroid function noted.  Dr. Delton Coombes wants to check T3 and free T4 today, okay to proceed with treatment. We will recheck TSH at next treatment in 3 weeks.

## 2020-06-18 LAB — T3: T3, Total: 84 ng/dL (ref 71–180)

## 2020-07-05 ENCOUNTER — Inpatient Hospital Stay (HOSPITAL_COMMUNITY): Payer: Medicaid Other

## 2020-07-05 NOTE — Progress Notes (Signed)
Nutrition Follow-up:  Patient with melanoma of 3rd toe.  S/p toe amputation on 03/07/2020.  Patient receiving pembrolizumab.   Spoke with patient via phone for nutrition follow-up.  Patient reports that her appetite has improved.  She is drinking 4-6 carnation breakfast essentials daily with eating solid meals as well.  Has some nausea but not daily.    Medications: reviewed  Labs: reviewed  Anthropometrics:   Weight 110 lb 11.2 oz on 8/9 increased from 104 lb on 6/28.     NUTRITION DIAGNOSIS: Inadequate oral intake improving   INTERVENTION:  Patient to continue oral nutrition supplements for added calories Continue eating high calorie, high protein foods to continue weight gain Patient has contact information    MONITORING, EVALUATION, GOAL: weight trends, intake   NEXT VISIT: Sept 24rd phone f/u  Carlitos Bottino B. Zenia Resides, [place], [place] Registered Dietitian [phone] (mobile)

## 2020-07-09 ENCOUNTER — Encounter (HOSPITAL_COMMUNITY): Payer: Self-pay | Admitting: Hematology

## 2020-07-09 ENCOUNTER — Inpatient Hospital Stay (HOSPITAL_COMMUNITY): Payer: Medicaid Other | Attending: Hematology | Admitting: Hematology

## 2020-07-09 ENCOUNTER — Inpatient Hospital Stay (HOSPITAL_COMMUNITY): Payer: Medicaid Other

## 2020-07-09 ENCOUNTER — Other Ambulatory Visit: Payer: Self-pay

## 2020-07-09 ENCOUNTER — Inpatient Hospital Stay (HOSPITAL_COMMUNITY): Payer: Medicaid Other | Attending: Hematology

## 2020-07-09 VITALS — BP 98/67 | HR 65 | Temp 97.3°F | Resp 18 | Wt 110.4 lb

## 2020-07-09 DIAGNOSIS — E032 Hypothyroidism due to medicaments and other exogenous substances: Secondary | ICD-10-CM

## 2020-07-09 DIAGNOSIS — C439 Malignant melanoma of skin, unspecified: Secondary | ICD-10-CM

## 2020-07-09 DIAGNOSIS — E041 Nontoxic single thyroid nodule: Secondary | ICD-10-CM | POA: Diagnosis not present

## 2020-07-09 DIAGNOSIS — Z5111 Encounter for antineoplastic chemotherapy: Secondary | ICD-10-CM | POA: Insufficient documentation

## 2020-07-09 DIAGNOSIS — E039 Hypothyroidism, unspecified: Secondary | ICD-10-CM | POA: Insufficient documentation

## 2020-07-09 DIAGNOSIS — D509 Iron deficiency anemia, unspecified: Secondary | ICD-10-CM | POA: Insufficient documentation

## 2020-07-09 DIAGNOSIS — C4371 Malignant melanoma of right lower limb, including hip: Secondary | ICD-10-CM | POA: Diagnosis not present

## 2020-07-09 DIAGNOSIS — Z79899 Other long term (current) drug therapy: Secondary | ICD-10-CM | POA: Diagnosis not present

## 2020-07-09 LAB — CBC WITH DIFFERENTIAL/PLATELET
Abs Immature Granulocytes: 0.01 10*3/uL (ref 0.00–0.07)
Basophils Absolute: 0 10*3/uL (ref 0.0–0.1)
Basophils Relative: 1 %
Eosinophils Absolute: 0.2 10*3/uL (ref 0.0–0.5)
Eosinophils Relative: 3 %
HCT: 39.1 % (ref 36.0–46.0)
Hemoglobin: 12.8 g/dL (ref 12.0–15.0)
Immature Granulocytes: 0 %
Lymphocytes Relative: 33 %
Lymphs Abs: 1.6 10*3/uL (ref 0.7–4.0)
MCH: 30.2 pg (ref 26.0–34.0)
MCHC: 32.7 g/dL (ref 30.0–36.0)
MCV: 92.2 fL (ref 80.0–100.0)
Monocytes Absolute: 0.3 10*3/uL (ref 0.1–1.0)
Monocytes Relative: 6 %
Neutro Abs: 2.8 10*3/uL (ref 1.7–7.7)
Neutrophils Relative %: 57 %
Platelets: 203 10*3/uL (ref 150–400)
RBC: 4.24 MIL/uL (ref 3.87–5.11)
RDW: 16.9 % — ABNORMAL HIGH (ref 11.5–15.5)
WBC: 4.9 10*3/uL (ref 4.0–10.5)
nRBC: 0 % (ref 0.0–0.2)

## 2020-07-09 LAB — COMPREHENSIVE METABOLIC PANEL
ALT: 16 U/L (ref 0–44)
AST: 19 U/L (ref 15–41)
Albumin: 3.9 g/dL (ref 3.5–5.0)
Alkaline Phosphatase: 62 U/L (ref 38–126)
Anion gap: 8 (ref 5–15)
BUN: 5 mg/dL — ABNORMAL LOW (ref 6–20)
CO2: 24 mmol/L (ref 22–32)
Calcium: 9.5 mg/dL (ref 8.9–10.3)
Chloride: 105 mmol/L (ref 98–111)
Creatinine, Ser: 0.64 mg/dL (ref 0.44–1.00)
GFR calc Af Amer: >60 mL/min (ref >60)
GFR calc non Af Amer: >60 mL/min (ref >60)
Glucose, Bld: 88 mg/dL (ref 70–99)
Potassium: 4.4 mmol/L (ref 3.5–5.1)
Sodium: 137 mmol/L (ref 135–145)
Total Bilirubin: 0.2 mg/dL — ABNORMAL LOW (ref 0.3–1.2)
Total Protein: 7 g/dL (ref 6.5–8.1)

## 2020-07-09 LAB — MAGNESIUM: Magnesium: 2 mg/dL (ref 1.7–2.4)

## 2020-07-09 LAB — TSH: TSH: 61.401 u[IU]/mL — ABNORMAL HIGH (ref 0.350–4.500)

## 2020-07-09 MED ORDER — SODIUM CHLORIDE 0.9 % IV SOLN: Freq: Once | INTRAVENOUS | Status: AC

## 2020-07-09 MED ORDER — LEVOTHYROXINE SODIUM 50 MCG PO TABS: 50.0000 ug | ORAL_TABLET | Freq: Every day | ORAL | 1 refills | Status: DC

## 2020-07-09 MED ORDER — SODIUM CHLORIDE 0.9 % IV SOLN
200.0000 mg | Freq: Once | INTRAVENOUS | Status: AC
  Administered 2020-07-09: 200 mg via INTRAVENOUS
  Filled 2020-07-09: qty 8

## 2020-07-09 MED ORDER — SODIUM CHLORIDE 0.9% FLUSH
10.0000 mL | INTRAVENOUS | Status: DC | PRN
  Administered 2020-07-09: 10 mL

## 2020-07-09 MED ORDER — HEPARIN SOD (PORK) LOCK FLUSH 100 UNIT/ML IV SOLN
500.0000 [IU] | Freq: Once | INTRAVENOUS | Status: AC | PRN
  Administered 2020-07-09: 500 [IU]

## 2020-07-09 NOTE — Patient Instructions (Signed)
[facility] Discharge Instructions for Patients Receiving Chemotherapy  Today you received the following chemotherapy agents Keytruda  To help prevent nausea and vomiting after your treatment, we encourage you to take your nausea medication .   If you develop nausea and vomiting that is not controlled by your nausea medication, call the clinic.   BELOW ARE SYMPTOMS THAT SHOULD BE REPORTED IMMEDIATELY:  *FEVER GREATER THAN 100.5 F  *CHILLS WITH OR WITHOUT FEVER  NAUSEA AND VOMITING THAT IS NOT CONTROLLED WITH YOUR NAUSEA MEDICATION  *UNUSUAL SHORTNESS OF BREATH  *UNUSUAL BRUISING OR BLEEDING  TENDERNESS IN MOUTH AND THROAT WITH OR WITHOUT PRESENCE OF ULCERS  *URINARY PROBLEMS  *BOWEL PROBLEMS  UNUSUAL RASH Items with * indicate a potential emergency and should be followed up as soon as possible.  Feel free to call the clinic should you have any questions or concerns. The clinic phone number is [phone].  Please show the CHEMO ALERT CARD at check-in to the Emergency Department and triage nurse.   

## 2020-07-09 NOTE — Patient Instructions (Signed)
[facility] at [facility] Discharge Instructions  You were seen today by Dr. Katragadda. He went over your recent results. You received your treatment today. Dr. Katragadda will see you back in 3 weeks for labs and follow up.   Thank you for choosing [facility] at [facility] to provide your oncology and hematology care.  To afford each patient quality time with our provider, please arrive at least 15 minutes before your scheduled appointment time.   If you have a lab appointment with the Cancer Center please come in thru the Main Entrance and check in at the main information desk  You need to re-schedule your appointment should you arrive 10 or more minutes late.  We strive to give you quality time with our providers, and arriving late affects you and other patients whose appointments are after yours.  Also, if you no show three or more times for appointments you may be dismissed from the clinic at the providers discretion.     Again, thank you for choosing [facility].  Our hope is that these requests will decrease the amount of time that you wait before being seen by our physicians.       _____________________________________________________________  Should you have questions after your visit to [facility], please contact our office at [phone] between the hours of 8:00 a.m. and 4:30 p.m.  Voicemails left after 4:00 p.m. will not be returned until the following business day.  For prescription refill requests, have your pharmacy contact our office and allow 72 hours.    Cancer Center Support Programs:   > Cancer Support Group  2nd Tuesday of the month 1pm-2pm, Journey Room    

## 2020-07-09 NOTE — Progress Notes (Signed)
Labs reviewed with MD today. Will proceed with treatment per MD.   Treatment given per orders. Patient tolerated it well without problems. Vitals stable and discharged home from clinic ambulatory. Follow up as scheduled.  

## 2020-07-09 NOTE — Progress Notes (Signed)
[place], [place] [address]   CLINIC:  Medical Oncology/Hematology  PCP:  Patient, No Pcp Per None None   REASON FOR VISIT:  Follow-up for malignant melanoma  PRIOR THERAPY: None  NGS Results: BRAF V600E and V600K negative  CURRENT THERAPY: Adjuvant Keytruda every 3 weeks  BRIEF ONCOLOGIC HISTORY:  Oncology History  Melanoma of skin ([place])  12/11/2019 Initial Diagnosis   Melanoma of skin ([place])   02/19/2020 Genetic Testing   BRAF Mutation Analysis     03/25/2020 -  Chemotherapy   The patient had pembrolizumab (KEYTRUDA) 200 mg in sodium chloride 0.9 % 50 mL chemo infusion, 200 mg, Intravenous, Once, 5 of 10 cycles Administration: 200 mg (03/25/2020), 200 mg (05/06/2020), 200 mg (05/27/2020), 200 mg (04/15/2020), 200 mg (06/17/2020)  for chemotherapy treatment.      CANCER STAGING: Cancer Staging Melanoma of skin ([place]) Staging form: Melanoma of the Skin, AJCC 8th Edition - Clinical stage from 02/08/2020: Stage III (cT2b, cN2a, cM0) - Unsigned   INTERVAL HISTORY:  Ms. Cristal Qadir, a 44 y.o. female, returns for routine follow-up and consideration for next cycle of chemotherapy. Bettyjane was last seen on 06/17/2020.  Due for cycle #6 of Keytruda today.   Overall, she tells me she has been feeling pretty well. Her cough is stable and she denies having any abdominal pain, N/V/D, itching or rash. Her appetite is good and she is eating everything.  Overall, she feels ready for next cycle of chemo today.    REVIEW OF SYSTEMS:  Review of Systems  Constitutional: Negative for appetite change and fatigue.  Respiratory: Positive for cough (stable).   Gastrointestinal: Negative for abdominal pain, diarrhea and vomiting.  Skin: Negative for itching and rash.  Neurological: Positive for headaches.  All other systems reviewed and are negative.   PAST MEDICAL/SURGICAL HISTORY:  Past Medical History:  Diagnosis Date   Anemia    Anxiety     Depression    GERD (gastroesophageal reflux disease)    Headache    Shingles    Tobacco abuse 05/06/2020   Past Surgical History:  Procedure Laterality Date   AMPUTATION TOE Right 03/07/2020   Procedure: RIGHT THIRD TOE AMPUTATION;  Surgeon: [name], MD;  Location: [facility];  Service: General;  Laterality: Right;   BREAST LUMPECTOMY  age 5   CESAREAN SECTION  01/06/2006   CESAREAN SECTION  10/15/2016   DENTAL SURGERY  2019   INGUINAL HERNIA REPAIR Right 01/30/2020   Procedure: RIGHT INGUINAL HERNIA REPAIR WITH  MESH;  Surgeon: [name], MD;  Location: [facility];  Service: General;  Laterality: Right;   INSERTION OF MESH Right 01/30/2020   Procedure: INSERTION OF MESH;  Surgeon: [name], MD;  Location: [facility];  Service: General;  Laterality: Right;   IRRIGATION AND DEBRIDEMENT ABSCESS Right 03/07/2020   Procedure: Aspiration of Right Groin Seroma;  Surgeon: [name], MD;  Location: [facility];  Service: General;  Laterality: Right;   MELANOMA EXCISION WITH SENTINEL LYMPH NODE BIOPSY Right 01/30/2020   Procedure: WIDE LOWER EXCISION RIGHT THIRD TOE MELANOMA EXCISION WITH SENTINEL LYMPH NODE BIOPSY;  Surgeon: [name], MD;  Location: [facility];  Service: General;  Laterality: Right;   PORTACATH PLACEMENT N/A 03/07/2020   Procedure: INSERTION PORT-A-CATH;  Surgeon: [name], MD;  Location: [facility];  Service: General;  Laterality: Left    SOCIAL HISTORY:  Social History   Socioeconomic History  Marital status: Legally Separated    Spouse name: Not on file   Number of children: 2   Years of education: Not on file   Highest education level: Not on file  Occupational History   Occupation: unemployed d/t COVID  Tobacco Use   Smoking status: Current Every Day Smoker    Packs/day: 1.00   Smokeless tobacco: Never Used  Vaping Use   Vaping Use: Never used  Substance and Sexual Activity   Alcohol  use: No   Drug use: Yes    Types: Marijuana   Sexual activity: Yes  Other Topics Concern   Not on file  Social History Narrative   Not on file   Social Determinants of Health   Financial Resource Strain: High Risk   Difficulty of Paying Living Expenses: Hard  Food Insecurity: No Food Insecurity   Worried About Running Out of Food in the Last Year: Never true   Ran Out of Food in the Last Year: Never true  Transportation Needs: No Transportation Needs   Lack of Transportation (Medical): No   Lack of Transportation (Non-Medical): No  Physical Activity: Inactive   Days of Exercise per Week: 0 days   Minutes of Exercise per Session: 0 min  Stress: Stress Concern Present   Feeling of Stress : Very much  Social Connections: Socially Isolated   Frequency of Communication with Friends and Family: Twice a week   Frequency of Social Gatherings with Friends and Family: Never   Attends Religious Services: 1 to 4 times per year   Active Member of [employer] or Organizations: No   Attends Music therapist: Never   Marital Status: Separated  Human resources officer Violence: Not At Risk   Fear of Current or Ex-Partner: No   Emotionally Abused: No   Physically Abused: No   Sexually Abused: No    FAMILY HISTORY:  Family History  Problem Relation Age of Onset   Heart disease Mother    Cancer Mother    Ovarian cysts Sister    Healthy Brother    Cancer Maternal Grandmother    Heart attack Maternal Grandmother    Cancer Maternal Grandfather    Cancer Paternal 59    Healthy Sister    Healthy Sister    Psoriasis Daughter    Healthy Son     CURRENT MEDICATIONS:  Current Outpatient Medications  Medication Sig Dispense Refill   Pembrolizumab (KEYTRUDA IV) Inject into the vein every 21 ( twenty-one) days.     gabapentin (NEURONTIN) 100 MG capsule Take by mouth daily. (Patient not taking: Reported on 07/09/2020)     lidocaine-prilocaine  (EMLA) cream Apply a small amount to port a cath site (do not rub in) and cover with plastic wrap 1 hour prior to chemotherapy appointments (Patient not taking: Reported on 07/09/2020) 30 g 3   ondansetron (ZOFRAN ODT) 4 MG disintegrating tablet Take 1 tablet (4 mg total) by mouth every 8 (eight) hours as needed for nausea or vomiting. (Patient not taking: Reported on 07/09/2020) 30 tablet 0   oxyCODONE (OXY IR/ROXICODONE) 5 MG immediate release tablet Take 1 tablet (5 mg total) by mouth every 6 (six) hours as needed for severe pain. (Patient not taking: Reported on 07/09/2020) 40 tablet 0   No current facility-administered medications for this visit.    ALLERGIES:  No Known Allergies  PHYSICAL EXAM:  Performance status (ECOG): 0 - Asymptomatic  Vitals:   07/09/20 1215  BP: 98/67  Pulse: 65  Resp: 18  Temp: (!) 97.3 F (36.3 C)  SpO2: 95%   Wt Readings from Last 3 Encounters:  07/09/20 110 lb 6.4 oz (50.1 kg)  06/17/20 110 lb 11.2 oz (50.2 kg)  05/06/20 104 lb 1.6 oz (47.2 kg)   Physical Exam Vitals reviewed.  Constitutional:      Appearance: Normal appearance.  Cardiovascular:     Rate and Rhythm: Normal rate and regular rhythm.     Pulses: Normal pulses.     Heart sounds: Normal heart sounds.  Pulmonary:     Effort: Pulmonary effort is normal.     Breath sounds: Normal breath sounds.  Chest:     Comments: Port-a-Cath in L chest Abdominal:     Palpations: Abdomen is soft. There is no hepatomegaly, splenomegaly or mass.     Tenderness: There is no abdominal tenderness.     Hernia: No hernia is present.  Musculoskeletal:     Right lower leg: No edema.     Left lower leg: No edema.  Neurological:     General: No focal deficit present.     Mental Status: She is alert and oriented to person, place, and time.  Psychiatric:        Mood and Affect: Mood normal.        Behavior: Behavior normal.     LABORATORY DATA:  I have reviewed the labs as listed.  CBC Latest Ref  Rng & Units 07/09/2020 06/17/2020 05/27/2020  WBC 4.0 - 10.5 K/uL 4.9 4.8 4.4  Hemoglobin 12.0 - 15.0 g/dL 12.8 12.7 13.7  Hematocrit 36 - 46 % 39.1 40.3 44.0  Platelets 150 - 400 K/uL 203 207 242   CMP Latest Ref Rng & Units 07/09/2020 06/17/2020 05/27/2020  Glucose 70 - 99 mg/dL 88 85 105(H)  BUN 6 - 20 mg/dL 5(L) 10 8  Creatinine 0.44 - 1.00 mg/dL 0.64 0.65 0.63  Sodium 135 - 145 mmol/L 137 136 139  Potassium 3.5 - 5.1 mmol/L 4.4 4.0 4.1  Chloride 98 - 111 mmol/L 105 105 104  CO2 22 - 32 mmol/L 24 24 27   Calcium 8.9 - 10.3 mg/dL 9.5 9.4 10.0  Total Protein 6.5 - 8.1 g/dL 7.0 6.9 7.3  Total Bilirubin 0.3 - 1.2 mg/dL 0.2(L) 0.2(L) 0.7  Alkaline Phos 38 - 126 U/L 62 68 70  AST 15 - 41 U/L 19 23 43(H)  ALT 0 - 44 U/L 16 21 56(H)    DIAGNOSTIC IMAGING:  I have independently reviewed the scans and discussed with the patient. CT Chest W Contrast  Result Date: 06/12/2020 CLINICAL DATA:  Restaging melanoma. EXAM: CT CHEST, ABDOMEN, AND PELVIS WITH CONTRAST TECHNIQUE: Multidetector CT imaging of the chest, abdomen and pelvis was performed following the standard protocol during bolus administration of intravenous contrast. CONTRAST:  22m OMNIPAQUE IOHEXOL 300 MG/ML  SOLN COMPARISON:  03/21/2020 FINDINGS: CT CHEST FINDINGS Cardiovascular: The heart is normal in size. No pericardial effusion. The aorta is normal in caliber. No dissection. No atherosclerotic calcifications. The branch vessels are patent. No coronary artery calcifications. The pulmonary arteries appear normal. Mediastinum/Nodes: No mediastinal or hilar mass or adenopathy. Stable benign-appearing residual thymic tissue in the anterior mediastinum. The esophagus is grossly normal. Lungs/Pleura: No worrisome pulmonary lesions to suggest pulmonary metastatic disease. There are mild emphysematous changes again demonstrated with scattered tiny centrilobular nodules, likely respiratory bronchiolitis. No infiltrates or effusions. No pleural nodules.  Musculoskeletal: No breast masses, supraclavicular or axillary adenopathy. The thyroid gland is normal. The bony thorax is intact.  No worrisome bone lesions. CT ABDOMEN PELVIS FINDINGS Hepatobiliary: No worrisome hepatic lesions to suggest metastatic disease. The portal and hepatic veins are patent. The gallbladder is unremarkable. No common bile duct dilatation. Pancreas: No mass, inflammation or ductal dilatation. Spleen: Normal size.  No focal lesions. Adrenals/Urinary Tract: The adrenal glands and kidneys are unremarkable. The bladder is normal. Stomach/Bowel: The stomach, duodenum, small bowel and colon are unremarkable. No acute inflammatory changes, mass lesions or obstructive findings. Vascular/Lymphatic: The aorta is normal in caliber. No dissection. Distal aortic calcifications are noted. The branch vessels are patent. The major venous structures are patent. No mesenteric or retroperitoneal mass or adenopathy. Small scattered lymph nodes are noted. Reproductive: The uterus and ovaries are unremarkable. Simple appearing 3 cm cyst associated with the left ovary. Other: No pelvic mass or adenopathy. No free pelvic fluid collections. No inguinal mass or adenopathy. No abdominal wall hernia or subcutaneous lesions. Musculoskeletal: No significant bony findings. IMPRESSION: 1. No CT findings for metastatic disease involving the chest, abdomen or pelvis. 2. Mild emphysematous changes and respiratory bronchiolitis. 3. Emphysema and aortic atherosclerosis. Aortic Atherosclerosis (ICD10-I70.0) and Emphysema (ICD10-J43.9). Electronically Signed   By: Marijo Sanes M.D.   On: 06/12/2020 16:44   CT Abdomen Pelvis W Contrast  Result Date: 06/12/2020 CLINICAL DATA:  Restaging melanoma. EXAM: CT CHEST, ABDOMEN, AND PELVIS WITH CONTRAST TECHNIQUE: Multidetector CT imaging of the chest, abdomen and pelvis was performed following the standard protocol during bolus administration of intravenous contrast. CONTRAST:  33m  OMNIPAQUE IOHEXOL 300 MG/ML  SOLN COMPARISON:  03/21/2020 FINDINGS: CT CHEST FINDINGS Cardiovascular: The heart is normal in size. No pericardial effusion. The aorta is normal in caliber. No dissection. No atherosclerotic calcifications. The branch vessels are patent. No coronary artery calcifications. The pulmonary arteries appear normal. Mediastinum/Nodes: No mediastinal or hilar mass or adenopathy. Stable benign-appearing residual thymic tissue in the anterior mediastinum. The esophagus is grossly normal. Lungs/Pleura: No worrisome pulmonary lesions to suggest pulmonary metastatic disease. There are mild emphysematous changes again demonstrated with scattered tiny centrilobular nodules, likely respiratory bronchiolitis. No infiltrates or effusions. No pleural nodules. Musculoskeletal: No breast masses, supraclavicular or axillary adenopathy. The thyroid gland is normal. The bony thorax is intact. No worrisome bone lesions. CT ABDOMEN PELVIS FINDINGS Hepatobiliary: No worrisome hepatic lesions to suggest metastatic disease. The portal and hepatic veins are patent. The gallbladder is unremarkable. No common bile duct dilatation. Pancreas: No mass, inflammation or ductal dilatation. Spleen: Normal size.  No focal lesions. Adrenals/Urinary Tract: The adrenal glands and kidneys are unremarkable. The bladder is normal. Stomach/Bowel: The stomach, duodenum, small bowel and colon are unremarkable. No acute inflammatory changes, mass lesions or obstructive findings. Vascular/Lymphatic: The aorta is normal in caliber. No dissection. Distal aortic calcifications are noted. The branch vessels are patent. The major venous structures are patent. No mesenteric or retroperitoneal mass or adenopathy. Small scattered lymph nodes are noted. Reproductive: The uterus and ovaries are unremarkable. Simple appearing 3 cm cyst associated with the left ovary. Other: No pelvic mass or adenopathy. No free pelvic fluid collections. No  inguinal mass or adenopathy. No abdominal wall hernia or subcutaneous lesions. Musculoskeletal: No significant bony findings. IMPRESSION: 1. No CT findings for metastatic disease involving the chest, abdomen or pelvis. 2. Mild emphysematous changes and respiratory bronchiolitis. 3. Emphysema and aortic atherosclerosis. Aortic Atherosclerosis (ICD10-I70.0) and Emphysema (ICD10-J43.9). Electronically Signed   By: PMarijo SanesM.D.   On: 06/12/2020 16:44   CT FOOT RIGHT W CONTRAST  Result Date: 06/13/2020 CLINICAL DATA:  Patient status post amputation of the right third toe for melanoma 03/07/2020. Pain, redness and swelling at the amputation site. EXAM: CT OF THE LOWER RIGHT EXTREMITY WITH CONTRAST TECHNIQUE: Multidetector CT imaging of the lower right extremity was performed according to the standard protocol following intravenous contrast administration. COMPARISON:  None. CONTRAST:  100 mL ISOVUE-300 IOPAMIDOL (ISOVUE-300) INJECTION 61% FINDINGS: Bones/Joint/Cartilage The patient is status post amputation of the third toe at the level of the base of the proximal phalanx. No bony destructive change or periosteal reaction is identified. The amputation site is well corticated. Ligaments Suboptimally assessed by CT. Muscles and Tendons Intact and normal appearance. Soft tissues No fluid collection is identified. No soft tissue gas or radiopaque foreign body. IMPRESSION: Status post amputation at the level of the base of the proximal phalanx of the third toe. No acute abnormality. Negative for evidence of osteomyelitis, abscess, septic joint or myositis. Electronically Signed   By: Inge Rise M.D.   On: 06/13/2020 15:19     ASSESSMENT:  1. 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 -He underwent right third  toe amputation at PIP level on 03/07/2020 for positive margins. Pathology did not show any residual melanoma. -CT chest and abdomen on 03/21/2020 did not show any evidence of metastatic disease. -Adjuvant immunotherapy with pembrolizumab 1 year recommended. -Pembrolizumab started on 03/25/2020. -CT CAP on 06/12/2020 did not show any evidence of metastatic disease in the chest, abdomen or pelvis.  Mild emphysematous changes and respiratory bronchiolitis. -CT right foot with contrast on 06/13/2020 shows no evidence of osteomyelitis, abscess, septic arthritis or myositis.  2. Family history: -Mother died of throat cancer. Maternal grandmother had breast cancer. Maternal aunt had pancreatic cancer. Maternal grandfather had bone cancer. Maternal great grandmother died of melanoma. -She will benefit from genetic testing.  3. Right thyroid nodule: -PET scan on 12/20/2019 showed uptake in the right thyroid. -Ultrasound on 02/05/2020 did not show any suspicious nodules. -FNA on 05/01/2020 showed benign follicular nodule, Bethesda category 2.   PLAN:  1. 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  No more menstrual bleeding reported. -Reviewed her LFTs which are within normal limits.  CBC was also within normal limits. -She may proceed with her Keytruda today.  2. Microcytic anemia: -She had Feraheme in the past.  Hemoglobin today is 12.8.  3.  Hypothyroidism: -TSH on 06/17/2020 was 35.  T3 and T4 were normal. -Repeat TSH today is elevated at 61. -We will start her on Synthroid 50 mcg daily.  She was told to take it on empty stomach.  We will continue to monitor TSH periodically.   Orders placed this encounter:  No orders of the defined types were placed in this encounter.    Derek Jack, MD [place] [phone]   I, Milinda Antis, am acting as a scribe for Dr.  Sanda Linger.  I, Derek Jack MD, have reviewed the above documentation for accuracy and completeness, and I agree with the above.

## 2020-07-09 NOTE — Progress Notes (Signed)
Patient was assessed by Dr. Katragadda and labs have been reviewed.  Patient is okay to proceed with treatment today. Primary RN and pharmacy aware.   

## 2020-07-22 DIAGNOSIS — R52 Pain, unspecified: Secondary | ICD-10-CM | POA: Diagnosis not present

## 2020-07-22 DIAGNOSIS — R197 Diarrhea, unspecified: Secondary | ICD-10-CM | POA: Diagnosis not present

## 2020-07-22 DIAGNOSIS — R0989 Other specified symptoms and signs involving the circulatory and respiratory systems: Secondary | ICD-10-CM | POA: Diagnosis not present

## 2020-07-30 ENCOUNTER — Inpatient Hospital Stay (HOSPITAL_COMMUNITY): Payer: Medicaid Other

## 2020-07-30 ENCOUNTER — Inpatient Hospital Stay (HOSPITAL_COMMUNITY): Payer: Medicaid Other | Attending: Hematology | Admitting: Hematology

## 2020-07-30 ENCOUNTER — Other Ambulatory Visit: Payer: Self-pay

## 2020-07-30 ENCOUNTER — Encounter (HOSPITAL_COMMUNITY): Payer: Self-pay | Admitting: Hematology

## 2020-07-30 VITALS — BP 87/63 | HR 64 | Temp 96.8°F | Resp 17 | Wt 108.6 lb

## 2020-07-30 VITALS — BP 90/62 | HR 61 | Temp 97.8°F | Resp 17

## 2020-07-30 DIAGNOSIS — D509 Iron deficiency anemia, unspecified: Secondary | ICD-10-CM | POA: Insufficient documentation

## 2020-07-30 DIAGNOSIS — Z79899 Other long term (current) drug therapy: Secondary | ICD-10-CM | POA: Insufficient documentation

## 2020-07-30 DIAGNOSIS — C4371 Malignant melanoma of right lower limb, including hip: Secondary | ICD-10-CM | POA: Insufficient documentation

## 2020-07-30 DIAGNOSIS — E039 Hypothyroidism, unspecified: Secondary | ICD-10-CM | POA: Diagnosis not present

## 2020-07-30 DIAGNOSIS — C439 Malignant melanoma of skin, unspecified: Secondary | ICD-10-CM

## 2020-07-30 DIAGNOSIS — Z5111 Encounter for antineoplastic chemotherapy: Secondary | ICD-10-CM | POA: Diagnosis not present

## 2020-07-30 LAB — CBC WITH DIFFERENTIAL/PLATELET
Abs Immature Granulocytes: 0.01 10*3/uL (ref 0.00–0.07)
Basophils Absolute: 0 10*3/uL (ref 0.0–0.1)
Basophils Relative: 1 %
Eosinophils Absolute: 0.2 10*3/uL (ref 0.0–0.5)
Eosinophils Relative: 4 %
HCT: 40.5 % (ref 36.0–46.0)
Hemoglobin: 13.1 g/dL (ref 12.0–15.0)
Immature Granulocytes: 0 %
Lymphocytes Relative: 36 %
Lymphs Abs: 1.6 10*3/uL (ref 0.7–4.0)
MCH: 30.5 pg (ref 26.0–34.0)
MCHC: 32.3 g/dL (ref 30.0–36.0)
MCV: 94.2 fL (ref 80.0–100.0)
Monocytes Absolute: 0.2 10*3/uL (ref 0.1–1.0)
Monocytes Relative: 5 %
Neutro Abs: 2.5 10*3/uL (ref 1.7–7.7)
Neutrophils Relative %: 54 %
Platelets: 232 10*3/uL (ref 150–400)
RBC: 4.3 MIL/uL (ref 3.87–5.11)
RDW: 14.6 % (ref 11.5–15.5)
WBC: 4.5 10*3/uL (ref 4.0–10.5)
nRBC: 0 % (ref 0.0–0.2)

## 2020-07-30 LAB — COMPREHENSIVE METABOLIC PANEL
ALT: 17 U/L (ref 0–44)
AST: 21 U/L (ref 15–41)
Albumin: 4 g/dL (ref 3.5–5.0)
Alkaline Phosphatase: 67 U/L (ref 38–126)
Anion gap: 8 (ref 5–15)
BUN: 9 mg/dL (ref 6–20)
CO2: 26 mmol/L (ref 22–32)
Calcium: 9.6 mg/dL (ref 8.9–10.3)
Chloride: 104 mmol/L (ref 98–111)
Creatinine, Ser: 0.74 mg/dL (ref 0.44–1.00)
GFR calc Af Amer: >60 mL/min (ref >60)
GFR calc non Af Amer: >60 mL/min (ref >60)
Glucose, Bld: 105 mg/dL — ABNORMAL HIGH (ref 70–99)
Potassium: 3.9 mmol/L (ref 3.5–5.1)
Sodium: 138 mmol/L (ref 135–145)
Total Bilirubin: 0.6 mg/dL (ref 0.3–1.2)
Total Protein: 7 g/dL (ref 6.5–8.1)

## 2020-07-30 LAB — MAGNESIUM: Magnesium: 2 mg/dL (ref 1.7–2.4)

## 2020-07-30 LAB — CORTISOL: Cortisol, Plasma: 5.3 ug/dL

## 2020-07-30 LAB — TSH: TSH: 42.136 u[IU]/mL — ABNORMAL HIGH (ref 0.350–4.500)

## 2020-07-30 MED ORDER — SODIUM CHLORIDE 0.9 % IV SOLN
200.0000 mg | Freq: Once | INTRAVENOUS | Status: AC
  Administered 2020-07-30: 200 mg via INTRAVENOUS
  Filled 2020-07-30: qty 8

## 2020-07-30 MED ORDER — HEPARIN SOD (PORK) LOCK FLUSH 100 UNIT/ML IV SOLN
500.0000 [IU] | Freq: Once | INTRAVENOUS | Status: AC | PRN
  Administered 2020-07-30: 500 [IU]

## 2020-07-30 MED ORDER — SODIUM CHLORIDE 0.9 % IV SOLN: Freq: Once | INTRAVENOUS | Status: AC

## 2020-07-30 MED ORDER — SODIUM CHLORIDE 0.9% FLUSH
10.0000 mL | INTRAVENOUS | Status: DC | PRN
  Administered 2020-07-30: 10 mL

## 2020-07-30 NOTE — Progress Notes (Signed)
Patient presents today for treatment and follow up visit with Dr. Delton Coombes. Labs reviewed by MD. Doug Sou updated. Vital signs stable. Labs within parameters for treatment.   Message received from Dr. Delton Coombes to proceed with treatment. Cortisol level added to labs downstairs per MD request.   Treatment given today per MD orders. Tolerated infusion without adverse affects. Vital signs stable. No complaints at this time. Discharged from clinic ambulatory in stable condition. Alert and oriented x 3. F/U with [facility] as scheduled.

## 2020-07-30 NOTE — Patient Instructions (Signed)
[place] at [facility] Discharge Instructions  You were seen today by Dr. Delton Coombes. He went over your recent results. You received your treatment today. You will be scheduled for an MRI scan of your brain before your next visit. Dr. Delton Coombes will see you back in 3 weeks for labs and follow up.   Thank you for choosing [place] at [facility] to provide your oncology and hematology care.  To afford each patient quality time with our provider, please arrive at least 15 minutes before your scheduled appointment time.   If you have a lab appointment with the [facility] please come in thru the Main Entrance and check in at the main information desk  You need to re-schedule your appointment should you arrive 10 or more minutes late.  We strive to give you quality time with our providers, and arriving late affects you and other patients whose appointments are after yours.  Also, if you no show three or more times for appointments you may be dismissed from the clinic at the providers discretion.     Again, thank you for choosing [facility].  Our hope is that these requests will decrease the amount of time that you wait before being seen by our physicians.       _____________________________________________________________  Should you have questions after your visit to [facility], please contact our office at [phone] between the hours of 8:00 a.m. and 4:30 p.m.  Voicemails left after 4:00 p.m. will not be returned until the following business day.  For prescription refill requests, have your pharmacy contact our office and allow 72 hours.    Cancer Center Support Programs:   > Cancer Support Group  2nd Tuesday of the month 1pm-2pm, Journey Room

## 2020-07-30 NOTE — Patient Instructions (Signed)
[facility] Discharge Instructions for Patients Receiving Chemotherapy  Today you received the following chemotherapy agents   To help prevent nausea and vomiting after your treatment, we encourage you to take your nausea medication   If you develop nausea and vomiting that is not controlled by your nausea medication, call the clinic.   BELOW ARE SYMPTOMS THAT SHOULD BE REPORTED IMMEDIATELY:  *FEVER GREATER THAN 100.5 F  *CHILLS WITH OR WITHOUT FEVER  NAUSEA AND VOMITING THAT IS NOT CONTROLLED WITH YOUR NAUSEA MEDICATION  *UNUSUAL SHORTNESS OF BREATH  *UNUSUAL BRUISING OR BLEEDING  TENDERNESS IN MOUTH AND THROAT WITH OR WITHOUT PRESENCE OF ULCERS  *URINARY PROBLEMS  *BOWEL PROBLEMS  UNUSUAL RASH Items with * indicate a potential emergency and should be followed up as soon as possible.  Feel free to call the clinic should you have any questions or concerns. The clinic phone number is [phone].  Please show the CHEMO ALERT CARD at check-in to the Emergency Department and triage nurse.   

## 2020-07-30 NOTE — Progress Notes (Signed)
Donna Munoz, [place] [id number]   CLINIC:  Medical Oncology/Hematology  PCP:  Patient, No Pcp Per None None   REASON FOR VISIT:  Follow-up for malignant melanoma  PRIOR THERAPY: None  NGS Results: BRAF V600E and V600K negative  CURRENT THERAPY: Adjuvant Keytruda every 3 weeks  BRIEF ONCOLOGIC HISTORY:  Oncology History  Melanoma of skin ([place])  12/11/2019 Initial Diagnosis   Melanoma of skin ([place])   02/19/2020 Genetic Testing   BRAF Mutation Analysis     03/25/2020 -  Chemotherapy   The patient had pembrolizumab (KEYTRUDA) 200 mg in sodium chloride 0.9 % 50 mL chemo infusion, 200 mg, Intravenous, Once, 7 of 10 cycles Administration: 200 mg (03/25/2020), 200 mg (05/06/2020), 200 mg (05/27/2020), 200 mg (07/09/2020), 200 mg (04/15/2020), 200 mg (06/17/2020)  for chemotherapy treatment.      CANCER STAGING: Cancer Staging Melanoma of skin ([place]) Staging form: Melanoma of the Skin, AJCC 8th Edition - Clinical stage from 02/08/2020: Stage III (cT2b, cN2a, cM0) - Unsigned   INTERVAL HISTORY:  [name], a 44 y.o. female, returns for routine follow-up and consideration for next cycle of chemotherapy. Donna Munoz was last seen on 07/09/2020.  Due for cycle #7 of Keytruda today.   Overall, she tells me she has been feeling okay. She reports that she was lightheaded last night when she went to bed and when she bends over. She has been having headaches behind her left eye which has been worsening for the past 5 weeks, resulting in nausea, blurry vision, lacrimation, and photophobia. She had to bury her father on 9/15 and her cousin on 9/17, so she has been under a lot of stress recently. She started having her period on 9/10 and is still bleeding; her usual periods last 4 days. She has craving of ice chips which she has had since the age of 16/17.  Overall, she feels ready for next cycle of chemo today.    REVIEW OF SYSTEMS:  Review of Systems    Constitutional: Positive for appetite change (moderately decreased) and fatigue (moderate).  Eyes: Positive for eye problems (blurry vision w/ headaches).  Respiratory: Positive for shortness of breath (at night).   Gastrointestinal: Positive for nausea (w/ headaches).  Neurological: Positive for headaches (behind L eye) and light-headedness.  Psychiatric/Behavioral: Positive for sleep disturbance.  All other systems reviewed and are negative.   PAST MEDICAL/SURGICAL HISTORY:  Past Medical History:  Diagnosis Date   Anemia    Anxiety    Depression    GERD (gastroesophageal reflux disease)    Headache    Shingles    Tobacco abuse 05/06/2020   Past Surgical History:  Procedure Laterality Date   AMPUTATION TOE Right 03/07/2020   Procedure: RIGHT THIRD TOE AMPUTATION;  Surgeon: [name], MD;  Location: [facility];  Service: General;  Laterality: Right;   BREAST LUMPECTOMY  age 40   CESAREAN SECTION  01/06/2006   CESAREAN SECTION  10/15/2016   DENTAL SURGERY  2019   INGUINAL HERNIA REPAIR Right 01/30/2020   Procedure: RIGHT INGUINAL HERNIA REPAIR WITH  MESH;  Surgeon: [name], MD;  Location: [facility];  Service: General;  Laterality: Right;   INSERTION OF MESH Right 01/30/2020   Procedure: INSERTION OF MESH;  Surgeon: [name], MD;  Location: [facility];  Service: General;  Laterality: Right;   IRRIGATION AND DEBRIDEMENT ABSCESS Right 03/07/2020   Procedure: Aspiration of Right Groin Seroma;  Surgeon:  [name], MD;  Location: [facility];  Service: General;  Laterality: Right;   MELANOMA EXCISION WITH SENTINEL LYMPH NODE BIOPSY Right 01/30/2020   Procedure: WIDE LOWER EXCISION RIGHT THIRD TOE MELANOMA EXCISION WITH SENTINEL LYMPH NODE BIOPSY;  Surgeon: [name], MD;  Location: [facility];  Service: General;  Laterality: Right;   PORTACATH PLACEMENT N/A 03/07/2020   Procedure: INSERTION PORT-A-CATH;  Surgeon:  [name], MD;  Location: [facility];  Service: General;  Laterality: Left    SOCIAL HISTORY:  Social History   Socioeconomic History   Marital status: Legally Separated    Spouse name: Not on file   Number of children: 2   Years of education: Not on file   Highest education level: Not on file  Occupational History   Occupation: unemployed d/t COVID  Tobacco Use   Smoking status: Current Every Day Smoker    Packs/day: 1.00   Smokeless tobacco: Never Used  Vaping Use   Vaping Use: Never used  Substance and Sexual Activity   Alcohol use: No   Drug use: Yes    Types: Marijuana   Sexual activity: Yes  Other Topics Concern   Not on file  Social History Narrative   Not on file   Social Determinants of Health   Financial Resource Strain: High Risk   Difficulty of Paying Living Expenses: Hard  Food Insecurity: No Food Insecurity   Worried About Running Out of Food in the Last Year: Never true   Ran Out of Food in the Last Year: Never true  Transportation Needs: No Transportation Needs   Lack of Transportation (Medical): No   Lack of Transportation (Non-Medical): No  Physical Activity: Inactive   Days of Exercise per Week: 0 days   Minutes of Exercise per Session: 0 min  Stress: Stress Concern Present   Feeling of Stress : Very much  Social Connections: Socially Isolated   Frequency of Communication with Friends and Family: Twice a week   Frequency of Social Gatherings with Friends and Family: Never   Attends Religious Services: 1 to 4 times per year   Active Member of [employer] or Organizations: No   Attends Music therapist: Never   Marital Status: Separated  Human resources officer Violence: Not At Risk   Fear of Current or Ex-Partner: No   Emotionally Abused: No   Physically Abused: No   Sexually Abused: No    FAMILY HISTORY:  Family History  Problem Relation Age of Onset   Heart disease Mother    Cancer Mother    Ovarian  cysts Sister    Healthy Brother    Cancer Maternal Grandmother    Heart attack Maternal Grandmother    Cancer Maternal Grandfather    Cancer Paternal Grandmother    Healthy Sister    Healthy Sister    Psoriasis Daughter    Healthy Son     CURRENT MEDICATIONS:  Current Outpatient Medications  Medication Sig Dispense Refill   lidocaine-prilocaine (EMLA) cream Apply a small amount to port a cath site (do not rub in) and cover with plastic wrap 1 hour prior to chemotherapy appointments 30 g 3   gabapentin (NEURONTIN) 100 MG capsule Take by mouth daily. (Patient not taking: Reported on 07/09/2020)     levothyroxine (SYNTHROID) 50 MCG tablet Take 1 tablet (50 mcg total) by mouth daily before breakfast. 30 tablet 1   ondansetron (ZOFRAN ODT) 4 MG disintegrating tablet Take 1 tablet (4 mg total) by mouth every  8 (eight) hours as needed for nausea or vomiting. (Patient not taking: Reported on 07/09/2020) 30 tablet 0   oxyCODONE (OXY IR/ROXICODONE) 5 MG immediate release tablet Take 1 tablet (5 mg total) by mouth every 6 (six) hours as needed for severe pain. (Patient not taking: Reported on 07/09/2020) 40 tablet 0   Pembrolizumab (KEYTRUDA IV) Inject into the vein every 21 ( twenty-one) days.     No current facility-administered medications for this visit.   Facility-Administered Medications Ordered in Other Visits  Medication Dose Route Frequency Provider Last Rate Last Admin   heparin lock flush 100 unit/mL  500 Units Intracatheter Once PRN Derek Jack, MD       pembrolizumab [facility]) 200 mg in sodium chloride 0.9 % 50 mL chemo infusion  200 mg Intravenous Once Derek Jack, MD       sodium chloride flush (NS) 0.9 % injection 10 mL  10 mL Intracatheter PRN Derek Jack, MD        ALLERGIES:  No Known Allergies  PHYSICAL EXAM:  Performance status (ECOG): 0 - Asymptomatic  Vitals:   07/30/20 1250  BP: (!) 87/63  Pulse: 64  Resp: 17  Temp: (!)  96.8 F (36 C)  SpO2: 100%   Wt Readings from Last 3 Encounters:  07/30/20 108 lb 9.6 oz (49.3 kg)  07/09/20 110 lb 6.4 oz (50.1 kg)  06/17/20 110 lb 11.2 oz (50.2 kg)   Physical Exam  LABORATORY DATA:  I have reviewed the labs as listed.  CBC Latest Ref Rng & Units 07/30/2020 07/09/2020 06/17/2020  WBC 4.0 - 10.5 K/uL 4.5 4.9 4.8  Hemoglobin 12.0 - 15.0 g/dL 13.1 12.8 12.7  Hematocrit 36 - 46 % 40.5 39.1 40.3  Platelets 150 - 400 K/uL 232 203 207   CMP Latest Ref Rng & Units 07/30/2020 07/09/2020 06/17/2020  Glucose 70 - 99 mg/dL 105(H) 88 85  BUN 6 - 20 mg/dL 9 5(L) 10  Creatinine 0.44 - 1.00 mg/dL 0.74 0.64 0.65  Sodium 135 - 145 mmol/L 138 137 136  Potassium 3.5 - 5.1 mmol/L 3.9 4.4 4.0  Chloride 98 - 111 mmol/L 104 105 105  CO2 22 - 32 mmol/L 26 24 24   Calcium 8.9 - 10.3 mg/dL 9.6 9.5 9.4  Total Protein 6.5 - 8.1 g/dL 7.0 7.0 6.9  Total Bilirubin 0.3 - 1.2 mg/dL 0.6 0.2(L) 0.2(L)  Alkaline Phos 38 - 126 U/L 67 62 68  AST 15 - 41 U/L 21 19 23   ALT 0 - 44 U/L 17 16 21     DIAGNOSTIC IMAGING:  I have independently reviewed the scans and discussed with the patient. No results found.   ASSESSMENT:  1. 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 Pathology did not show any residual melanoma. -CT chest and abdomen on 03/21/2020 did not show any evidence of metastatic disease. -Adjuvant immunotherapy with pembrolizumab 1 year recommended. -Pembrolizumab started on 03/25/2020. -CT CAP on 06/12/2020 did not show any evidence of metastatic disease in the chest, abdomen or pelvis. Mild emphysematous changes and respiratory bronchiolitis. -CT right foot with contrast on 06/13/2020 shows no evidence of  osteomyelitis, abscess, septic arthritis or myositis.  2. Family history: -Mother died of throat cancer. Maternal grandmother had breast cancer. Maternal  aunt had pancreatic cancer. Maternal grandfather had bone cancer. Maternal great grandmother died of melanoma. -She will benefit from genetic testing.  3. Right thyroid nodule: -PET scan on 12/20/2019 showed uptake in the right thyroid. -Ultrasound on 02/05/2020 did not show any suspicious nodules. -FNA on 05/01/2020 showed benign follicular nodule, Bethesda category 2.   PLAN:  1. 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 -Review of labs showed normal LFTs and CBC.  She will proceed with Keytruda today. -She reported some lightheadedness when she is bending forward.  Blood pressure today is 87/63.  We will obtain a serum cortisol level. -She reported worsening of headaches in the last 5 weeks.  Headache is behind the left eye which is her chronic headaches.  She also has some blurring and watering of the left eye.  Will obtain MRI of the brain with and without contrast. -She also had 2 family deaths last week and is undergoing a lot of stress.  2. Microcytic anemia: -Last Feraheme on 05/06/2020.  Hemoglobin today is 13.1.  3.Hypothyroidism: -Last TSH was elevated.  Synthroid 50 mcg will be continued.   Orders placed this encounter:  Orders Placed This Encounter  Procedures   MR Forsan, MD [place] [phone]   I, Milinda Antis, am acting as a scribe for Dr. Sanda Linger.  I, Derek Jack MD, have reviewed the above documentation for accuracy and completeness, and I agree with the above.

## 2020-07-30 NOTE — Progress Notes (Signed)
Patient was assessed by Dr. Delton Coombes and labs have been reviewed.  Patient is c/o headache, orders received for MR brain w wo contrast.  Patient is okay to proceed with treatment today. Primary RN and pharmacy aware.

## 2020-08-02 ENCOUNTER — Ambulatory Visit (HOSPITAL_COMMUNITY): Payer: Medicaid Other

## 2020-08-02 NOTE — Progress Notes (Signed)
Nutrition Follow-up:  Patient with melanoma of 3rd toe.  S/p amputation on 03/07/2020.  Patient receiving pembrolizumab.  Noted planning MRI of brain due to headaches.   Spoke with patient via phone for nutrition follow-up.  Patient reports that over the last week and half appetite has been down due to family issues.  Noted lost father on 9/15 and cousin on 9/17.  Reports overall appetite is good. Drinking carnation breakfast essentials shake at times. Drank 2 yesterday.  Eating regular meals as well.  Recently found Skippy peanut butter with extra protein and she has been eating peanut butter and jelly sandwiches.      Medications: reviewed  Labs: reviewed  Anthropometrics:   Weight slight decrease to 108 lb from 110 lb on 8/9.     NUTRITION DIAGNOSIS: Inadequate oral intake improved   INTERVENTION:  Patient to continue oral nutrition supplements to provide additional calories and protein for weight gain.  Continue eating high calorie, high protein foods. Contact information provided to patient and patient will contact RD if needed in the future   NEXT VISIT: no follow-up Patient has RD contact information  Keiaira Donlan B. Zenia Resides, [place], [place] Registered Dietitian [phone] (mobile)

## 2020-08-19 ENCOUNTER — Other Ambulatory Visit: Payer: Self-pay

## 2020-08-19 ENCOUNTER — Ambulatory Visit (HOSPITAL_COMMUNITY)
Admission: RE | Admit: 2020-08-19 | Discharge: 2020-08-19 | Disposition: A | Discharge status: Home / Self Care | Payer: Medicaid Other | Source: Ambulatory Visit | Attending: Hematology | Admitting: Hematology

## 2020-08-19 DIAGNOSIS — C4371 Malignant melanoma of right lower limb, including hip: Secondary | ICD-10-CM | POA: Diagnosis not present

## 2020-08-19 DIAGNOSIS — C439 Malignant melanoma of skin, unspecified: Secondary | ICD-10-CM | POA: Diagnosis not present

## 2020-08-19 DIAGNOSIS — R519 Headache, unspecified: Secondary | ICD-10-CM | POA: Diagnosis not present

## 2020-08-19 DIAGNOSIS — Z85820 Personal history of malignant melanoma of skin: Secondary | ICD-10-CM | POA: Diagnosis not present

## 2020-08-19 DIAGNOSIS — R9082 White matter disease, unspecified: Secondary | ICD-10-CM | POA: Diagnosis not present

## 2020-08-19 DIAGNOSIS — R531 Weakness: Secondary | ICD-10-CM | POA: Diagnosis not present

## 2020-08-19 DIAGNOSIS — M792 Neuralgia and neuritis, unspecified: Secondary | ICD-10-CM | POA: Diagnosis not present

## 2020-08-19 MED ORDER — GADOBUTROL 1 MMOL/ML IV SOLN
5.0000 mL | Freq: Once | INTRAVENOUS | Status: AC | PRN
  Administered 2020-08-19: 5 mL via INTRAVENOUS

## 2020-08-21 ENCOUNTER — Other Ambulatory Visit (HOSPITAL_COMMUNITY): Payer: Medicaid Other

## 2020-08-21 ENCOUNTER — Ambulatory Visit (HOSPITAL_COMMUNITY): Payer: Medicaid Other

## 2020-08-21 ENCOUNTER — Ambulatory Visit (HOSPITAL_COMMUNITY): Payer: Medicaid Other | Admitting: Hematology

## 2020-08-22 ENCOUNTER — Inpatient Hospital Stay (HOSPITAL_COMMUNITY): Payer: Medicaid Other | Attending: Hematology

## 2020-08-22 ENCOUNTER — Inpatient Hospital Stay (HOSPITAL_COMMUNITY): Payer: Medicaid Other

## 2020-08-22 ENCOUNTER — Other Ambulatory Visit: Payer: Self-pay

## 2020-08-22 ENCOUNTER — Inpatient Hospital Stay (HOSPITAL_BASED_OUTPATIENT_CLINIC_OR_DEPARTMENT_OTHER): Payer: Medicaid Other | Admitting: Hematology

## 2020-08-22 VITALS — BP 90/63 | HR 65 | Temp 97.0°F | Resp 18

## 2020-08-22 VITALS — BP 93/59 | HR 66 | Temp 97.1°F | Resp 18 | Wt 108.6 lb

## 2020-08-22 DIAGNOSIS — E039 Hypothyroidism, unspecified: Secondary | ICD-10-CM | POA: Insufficient documentation

## 2020-08-22 DIAGNOSIS — Z79899 Other long term (current) drug therapy: Secondary | ICD-10-CM | POA: Insufficient documentation

## 2020-08-22 DIAGNOSIS — Z5111 Encounter for antineoplastic chemotherapy: Secondary | ICD-10-CM | POA: Diagnosis not present

## 2020-08-22 DIAGNOSIS — D509 Iron deficiency anemia, unspecified: Secondary | ICD-10-CM | POA: Insufficient documentation

## 2020-08-22 DIAGNOSIS — C439 Malignant melanoma of skin, unspecified: Secondary | ICD-10-CM

## 2020-08-22 DIAGNOSIS — C4371 Malignant melanoma of right lower limb, including hip: Secondary | ICD-10-CM | POA: Diagnosis present

## 2020-08-22 LAB — COMPREHENSIVE METABOLIC PANEL
ALT: 17 U/L (ref 0–44)
AST: 21 U/L (ref 15–41)
Albumin: 4.1 g/dL (ref 3.5–5.0)
Alkaline Phosphatase: 62 U/L (ref 38–126)
Anion gap: 9 (ref 5–15)
BUN: 6 mg/dL (ref 6–20)
CO2: 25 mmol/L (ref 22–32)
Calcium: 9.5 mg/dL (ref 8.9–10.3)
Chloride: 102 mmol/L (ref 98–111)
Creatinine, Ser: 0.66 mg/dL (ref 0.44–1.00)
GFR, Estimated: >60 mL/min (ref >60)
Glucose, Bld: 92 mg/dL (ref 70–99)
Potassium: 3.4 mmol/L — ABNORMAL LOW (ref 3.5–5.1)
Sodium: 136 mmol/L (ref 135–145)
Total Bilirubin: 0.8 mg/dL (ref 0.3–1.2)
Total Protein: 7 g/dL (ref 6.5–8.1)

## 2020-08-22 LAB — CBC WITH DIFFERENTIAL/PLATELET
Abs Immature Granulocytes: 0.02 10*3/uL (ref 0.00–0.07)
Basophils Absolute: 0 10*3/uL (ref 0.0–0.1)
Basophils Relative: 1 %
Eosinophils Absolute: 0.2 10*3/uL (ref 0.0–0.5)
Eosinophils Relative: 4 %
HCT: 39 % (ref 36.0–46.0)
Hemoglobin: 12.9 g/dL (ref 12.0–15.0)
Immature Granulocytes: 0 %
Lymphocytes Relative: 29 %
Lymphs Abs: 1.5 10*3/uL (ref 0.7–4.0)
MCH: 31.5 pg (ref 26.0–34.0)
MCHC: 33.1 g/dL (ref 30.0–36.0)
MCV: 95.4 fL (ref 80.0–100.0)
Monocytes Absolute: 0.3 10*3/uL (ref 0.1–1.0)
Monocytes Relative: 6 %
Neutro Abs: 3.2 10*3/uL (ref 1.7–7.7)
Neutrophils Relative %: 60 %
Platelets: 221 10*3/uL (ref 150–400)
RBC: 4.09 MIL/uL (ref 3.87–5.11)
RDW: 13.8 % (ref 11.5–15.5)
WBC: 5.3 10*3/uL (ref 4.0–10.5)
nRBC: 0 % (ref 0.0–0.2)

## 2020-08-22 MED ORDER — HEPARIN SOD (PORK) LOCK FLUSH 100 UNIT/ML IV SOLN
500.0000 [IU] | Freq: Once | INTRAVENOUS | Status: AC | PRN
  Administered 2020-08-22: 500 [IU]

## 2020-08-22 MED ORDER — SODIUM CHLORIDE 0.9 % IV SOLN
200.0000 mg | Freq: Once | INTRAVENOUS | Status: AC
  Administered 2020-08-22: 200 mg via INTRAVENOUS
  Filled 2020-08-22: qty 8

## 2020-08-22 MED ORDER — SODIUM CHLORIDE 0.9 % IV SOLN: Freq: Once | INTRAVENOUS | Status: AC

## 2020-08-22 MED ORDER — SODIUM CHLORIDE 0.9% FLUSH
10.0000 mL | INTRAVENOUS | Status: DC | PRN
  Administered 2020-08-22: 10 mL

## 2020-08-22 NOTE — Progress Notes (Signed)
Donna Munoz, [place] [address]   CLINIC:  Medical Oncology/Hematology  PCP:  Patient, No Pcp Per None None   REASON FOR VISIT:  Follow-up for malignant melanoma  PRIOR THERAPY: Resection of right third toe on 01/30/2020 with right third toe amputation at PIP joint on 03/07/2020  NGS Results: BRAF V600E and V600K negative  CURRENT THERAPY: Adjuvant Keytruda every 3 weeks  BRIEF ONCOLOGIC HISTORY:  Oncology History  Melanoma of skin ([place])  12/11/2019 Initial Diagnosis   Melanoma of skin ([place])   02/19/2020 Genetic Testing   BRAF Mutation Analysis     03/25/2020 -  Chemotherapy   The patient had pembrolizumab (KEYTRUDA) 200 mg in sodium chloride 0.9 % 50 mL chemo infusion, 200 mg, Intravenous, Once, 8 of 12 cycles Administration: 200 mg (03/25/2020), 200 mg (05/06/2020), 200 mg (05/27/2020), 200 mg (07/09/2020), 200 mg (04/15/2020), 200 mg (06/17/2020), 200 mg (07/30/2020)  for chemotherapy treatment.      CANCER STAGING: Cancer Staging Melanoma of skin ([place]) Staging form: Melanoma of the Skin, AJCC 8th Edition - Clinical stage from 02/08/2020: Stage III (cT2b, cN2a, cM0) - Unsigned   INTERVAL HISTORY:  Donna Munoz, a 44 y.o. female, returns for routine follow-up and consideration for next cycle of chemotherapy. Donna Munoz was last seen on 07/30/2020.  Due for cycle #8 of Keytruda today.   Overall, she tells me she has been feeling okay. She tolerated the previous treatment well and denies having N/V/D, though she reports having orthopnea now, especially when she lies down on her bed. She has been having sternal and right-sided CP when she walks or even washes dishes. She reports having intermittent numbness and tingling on her left hand. She continues having headaches behind her left eye. Her appetite is down to 25% and she eats smaller meals.  She has a history of coronary disease in her mother and maternal GM. Unfortunately, she has even more  stress now as she is being sued by her foster kid's mother; she smokes 2-3 joints of marijuana a day, but it is not helping.  Overall, she feels ready for next cycle of chemo today.    REVIEW OF SYSTEMS:  Review of Systems  Constitutional: Positive for appetite change (25%) and fatigue (25%).  HENT:   Sore throat: sensation of something in throat.   Respiratory: Positive for shortness of breath (orthopnea).   Cardiovascular: Positive for chest pain (R side & sternal pain w/ exertion).  Neurological: Positive for dizziness, headaches (behind L eye) and numbness (L arm).  Psychiatric/Behavioral: Positive for depression.    PAST MEDICAL/SURGICAL HISTORY:  Past Medical History:  Diagnosis Date  . Anemia   . Anxiety   . Depression   . GERD (gastroesophageal reflux disease)   . Headache   . Shingles   . Tobacco abuse 05/06/2020   Past Surgical History:  Procedure Laterality Date  . AMPUTATION TOE Right 03/07/2020   Procedure: RIGHT THIRD TOE AMPUTATION;  Surgeon: [name], MD;  Location: [facility];  Service: General;  Laterality: Right;  . BREAST LUMPECTOMY  age 64  . CESAREAN SECTION  01/06/2006  . CESAREAN SECTION  10/15/2016  . DENTAL SURGERY  2019  . INGUINAL HERNIA REPAIR Right 01/30/2020   Procedure: RIGHT INGUINAL HERNIA REPAIR WITH  MESH;  Surgeon: [name], MD;  Location: [place];  Service: General;  Laterality: Right;  . INSERTION OF MESH Right 01/30/2020   Procedure: INSERTION OF MESH;  Surgeon:  [name], MD;  Location: [place];  Service: General;  Laterality: Right;  . IRRIGATION AND DEBRIDEMENT ABSCESS Right 03/07/2020   Procedure: Aspiration of Right Groin Seroma;  Surgeon: [name], MD;  Location: [facility];  Service: General;  Laterality: Right;  . MELANOMA EXCISION WITH SENTINEL LYMPH NODE BIOPSY Right 01/30/2020   Procedure: WIDE LOWER EXCISION RIGHT THIRD TOE MELANOMA EXCISION WITH SENTINEL LYMPH NODE BIOPSY;  Surgeon:  [name], MD;  Location: [facility];  Service: General;  Laterality: Right;  . PORTACATH PLACEMENT N/A 03/07/2020   Procedure: INSERTION PORT-A-CATH;  Surgeon: [name], MD;  Location: [facility];  Service: General;  Laterality: Left    SOCIAL HISTORY:  Social History   Socioeconomic History  . Marital status: Legally Separated    Spouse name: Not on file  . Number of children: 2  . Years of education: Not on file  . Highest education level: Not on file  Occupational History  . Occupation: unemployed d/t COVID  Tobacco Use  . Smoking status: Current Every Day Smoker    Packs/day: 1.00  . Smokeless tobacco: Never Used  Vaping Use  . Vaping Use: Never used  Substance and Sexual Activity  . Alcohol use: No  . Drug use: Yes    Types: Marijuana  . Sexual activity: Yes  Other Topics Concern  . Not on file  Social History Narrative  . Not on file   Social Determinants of Health   Financial Resource Strain: High Risk  . Difficulty of Paying Living Expenses: Hard  Food Insecurity: No Food Insecurity  . Worried About Charity fundraiser in the Last Year: Never true  . Ran Out of Food in the Last Year: Never true  Transportation Needs: No Transportation Needs  . Lack of Transportation (Medical): No  . Lack of Transportation (Non-Medical): No  Physical Activity: Inactive  . Days of Exercise per Week: 0 days  . Minutes of Exercise per Session: 0 min  Stress: Stress Concern Present  . Feeling of Stress : Very much  Social Connections: Socially Isolated  . Frequency of Communication with Friends and Family: Twice a week  . Frequency of Social Gatherings with Friends and Family: Never  . Attends Religious Services: 1 to 4 times per year  . Active Member of Clubs or Organizations: No  . Attends Archivist Meetings: Never  . Marital Status: Separated  Intimate Partner Violence: Not At Risk  . Fear of Current or Ex-Partner: No  . Emotionally Abused:  No  . Physically Abused: No  . Sexually Abused: No    FAMILY HISTORY:  Family History  Problem Relation Age of Onset  . Heart disease Mother   . Cancer Mother   . Ovarian cysts Sister   . Healthy Brother   . Cancer Maternal Grandmother   . Heart attack Maternal Grandmother   . Cancer Maternal Grandfather   . Cancer Paternal Grandmother   . Healthy Sister   . Healthy Sister   . Psoriasis Daughter   . Healthy Son     CURRENT MEDICATIONS:  Current Outpatient Medications  Medication Sig Dispense Refill  . gabapentin (NEURONTIN) 100 MG capsule Take by mouth daily.     Marland Kitchen levothyroxine (SYNTHROID) 50 MCG tablet Take 1 tablet (50 mcg total) by mouth daily before breakfast. 30 tablet 1  . oxyCODONE (OXY IR/ROXICODONE) 5 MG immediate release tablet Take 1 tablet (5 mg total) by mouth every 6 (six) hours  as needed for severe pain. 40 tablet 0  . Pembrolizumab (KEYTRUDA IV) Inject into the vein every 21 ( twenty-one) days.    Marland Kitchen lidocaine-prilocaine (EMLA) cream Apply a small amount to port a cath site (do not rub in) and cover with plastic wrap 1 hour prior to chemotherapy appointments (Patient not taking: Reported on 08/22/2020) 30 g 3  . ondansetron (ZOFRAN ODT) 4 MG disintegrating tablet Take 1 tablet (4 mg total) by mouth every 8 (eight) hours as needed for nausea or vomiting. (Patient not taking: Reported on 08/22/2020) 30 tablet 0   No current facility-administered medications for this visit.   Facility-Administered Medications Ordered in Other Visits  Medication Dose Route Frequency Provider Last Rate Last Admin  . 0.9 %  sodium chloride infusion   Intravenous Once Derek Jack, MD      . heparin lock flush 100 unit/mL  500 Units Intracatheter Once PRN Derek Jack, MD      . pembrolizumab [facility]) 200 mg in sodium chloride 0.9 % 50 mL chemo infusion  200 mg Intravenous Once Derek Jack, MD      . sodium chloride flush (NS) 0.9 % injection 10 mL  10 mL  Intracatheter PRN Derek Jack, MD        ALLERGIES:  No Known Allergies  PHYSICAL EXAM:  Performance status (ECOG): 0 - Asymptomatic  Vitals:   08/22/20 1059  BP: (!) 93/59  Pulse: 66  Resp: 18  Temp: (!) 97.1 F (36.2 C)  SpO2: 100%   Wt Readings from Last 3 Encounters:  08/22/20 108 lb 9.6 oz (49.3 kg)  07/30/20 108 lb 9.6 oz (49.3 kg)  07/09/20 110 lb 6.4 oz (50.1 kg)   Physical Exam Vitals reviewed.  Constitutional:      Appearance: Normal appearance.  Cardiovascular:     Rate and Rhythm: Normal rate and regular rhythm.     Pulses: Normal pulses.     Heart sounds: Normal heart sounds.  Pulmonary:     Effort: Pulmonary effort is normal.     Breath sounds: Normal breath sounds.  Chest:     Comments: Port-a-Cath in L chest Neurological:     General: No focal deficit present.     Mental Status: She is alert and oriented to person, place, and time.  Psychiatric:        Mood and Affect: Mood normal.        Behavior: Behavior normal.     LABORATORY DATA:  I have reviewed the labs as listed.  CBC Latest Ref Rng & Units 08/22/2020 07/30/2020 07/09/2020  WBC 4.0 - 10.5 K/uL 5.3 4.5 4.9  Hemoglobin 12.0 - 15.0 g/dL 12.9 13.1 12.8  Hematocrit 36 - 46 % 39.0 40.5 39.1  Platelets 150 - 400 K/uL 221 232 203   CMP Latest Ref Rng & Units 08/22/2020 07/30/2020 07/09/2020  Glucose 70 - 99 mg/dL 92 105(H) 88  BUN 6 - 20 mg/dL 6 9 5(L)  Creatinine 0.44 - 1.00 mg/dL 0.66 0.74 0.64  Sodium 135 - 145 mmol/L 136 138 137  Potassium 3.5 - 5.1 mmol/L 3.4(L) 3.9 4.4  Chloride 98 - 111 mmol/L 102 104 105  CO2 22 - 32 mmol/L 25 26 24   Calcium 8.9 - 10.3 mg/dL 9.5 9.6 9.5  Total Protein 6.5 - 8.1 g/dL 7.0 7.0 7.0  Total Bilirubin 0.3 - 1.2 mg/dL 0.8 0.6 0.2(L)  Alkaline Phos 38 - 126 U/L 62 67 62  AST 15 - 41 U/L 21 21 19  ALT 0 - 44 U/L 17 17 16     DIAGNOSTIC IMAGING:  I have independently reviewed the scans and discussed with the patient. MR Brain W Wo  Contrast  Result Date: 08/19/2020 CLINICAL DATA:  44 year old female with headache and weakness for 5 months. History of melanoma of the right 3rd toe status post amputation. EXAM: MRI HEAD WITHOUT AND WITH CONTRAST TECHNIQUE: Multiplanar, multiecho pulse sequences of the brain and surrounding structures were obtained without and with intravenous contrast. CONTRAST:  62m GADAVIST GADOBUTROL 1 MMOL/ML IV SOLN COMPARISON:  Brain MRI 02/24/2020. FINDINGS: Brain: Normal cerebral volume. No restricted diffusion to suggest acute infarction. No midline shift, mass effect, evidence of mass lesion, ventriculomegaly, extra-axial collection or acute intracranial hemorrhage. Cervicomedullary junction and pituitary are within normal limits. No intrinsic T1 hyperintense or hemorrhagic lesions in the brain. No abnormal enhancement identified. Stable gray and white matter signal throughout the brain. Mildly to moderately advanced for age nonspecific subcortical white matter T2 and FLAIR hyperintense foci which are mostly in the anterior and superior frontal lobes. No cortical encephalomalacia. Deep gray nuclei, brainstem and cerebellum remain negative. Tiny developmental venous anomaly suspected in the right cerebellum and stable on series 18, image 13. No dural thickening. Vascular: Major intracranial vascular flow voids are stable. The major dural venous sinuses are enhancing and appear to be patent. Skull and upper cervical spine: Negative visible cervical spine. Visible bone marrow signal is stable and within normal limits. Sinuses/Orbits: Stable and negative. Other: Mastoids remain clear. Visible internal auditory structures appear normal. Normal stylomastoid foramina. Scalp and face soft tissues appear negative. IMPRESSION: 1. Stable.  No metastatic disease or acute intracranial abnormality. 2. Nonspecific cerebral white matter signal changes as described in April. Electronically Signed   By: HGenevie AnnM.D.   On: 08/19/2020  20:46     ASSESSMENT:  1. 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 Pathology did not show any residual melanoma. -CT chest and abdomen on 03/21/2020 did not show any evidence of metastatic disease. -Adjuvant immunotherapy with pembrolizumab 1 year recommended. -Pembrolizumab started on 03/25/2020. -CT CAP on 06/12/2020 did not show any evidence of metastatic disease in the chest, abdomen or pelvis. Mild emphysematous changes and respiratory bronchiolitis. -CT right foot with contrast on 06/13/2020 shows no evidence of osteomyelitis, abscess, septic arthritis or myositis. -MRI of the brain on 08/19/2020 did not show any evidence of metastatic disease.  2. Family history: -Mother died of throat cancer. Maternal grandmother had breast cancer. Maternal aunt had pancreatic cancer. Maternal grandfather had bone cancer. Maternal great grandmother died of melanoma. -She will benefit from genetic testing.  3. Right thyroid nodule: -PET scan on 12/20/2019 showed uptake in the right thyroid. -Ultrasound on 02/05/2020 did not show any suspicious nodules. -FNA on 05/01/2020 showed benign follicular nodule, Bethesda category 2.   PLAN:  1. Stage IIIb (T2B N2A) malignant melanoma of the right third toe, BRAF V6 100 E-: -She is tolerating immunotherapy without any major side effects. -I reviewed MRI of the brain results which were normal. -Serum cortisol levels were normal.  TSH improved to 42. -She continues to have left-sided headaches behind the eye.  She is also undergoing a lot of stress at home. -She does report some atypical chest  pains on rest and exertion.  She also has family history of MI.  Mother had MI  at age 71.  Father also had strokes in his 55s.  I have recommended cardiology evaluation. -Proceed with her treatment today.  RTC 3 weeks.  2. Microcytic anemia: -Hemoglobin today is 12.9.  Last Feraheme 05/06/2020.  No Feraheme needed.  3.Hypothyroidism: -Continue Synthroid 50 mcg daily.  Will check TSH.   Orders placed this encounter:  No orders of the defined types were placed in this encounter.    Derek Jack, MD [place] [phone]   I, Milinda Antis, am acting as a scribe for Dr. Sanda Linger.  I, Derek Jack MD, have reviewed the above documentation for accuracy and completeness, and I agree with the above.

## 2020-08-22 NOTE — Patient Instructions (Signed)
[place] at [facility] Discharge Instructions  You were seen today by Dr. Delton Coombes. He went over your recent results. You received your treatment today. You will be referred to cardiology to evaluate your chest pain. Dr. Delton Coombes will see you back in 3 weeks for labs and follow up.   Thank you for choosing [place] at [facility] to provide your oncology and hematology care.  To afford each patient quality time with our provider, please arrive at least 15 minutes before your scheduled appointment time.   If you have a lab appointment with the [facility] please come in thru the Main Entrance and check in at the main information desk  You need to re-schedule your appointment should you arrive 10 or more minutes late.  We strive to give you quality time with our providers, and arriving late affects you and other patients whose appointments are after yours.  Also, if you no show three or more times for appointments you may be dismissed from the clinic at the providers discretion.     Again, thank you for choosing [facility].  Our hope is that these requests will decrease the amount of time that you wait before being seen by our physicians.       _____________________________________________________________  Should you have questions after your visit to [facility], please contact our office at [phone] between the hours of 8:00 a.m. and 4:30 p.m.  Voicemails left after 4:00 p.m. will not be returned until the following business day.  For prescription refill requests, have your pharmacy contact our office and allow 72 hours.    Cancer Center Support Programs:   > Cancer Support Group  2nd Tuesday of the month 1pm-2pm, Journey Room

## 2020-08-22 NOTE — Progress Notes (Signed)
Donna Munoz presents today for [employer]. Pt denies any new changes or symptoms since last treatment. Lab results and vitals have been reviewed and are stable and within parameters for treatment. Patient has been assessed by Dr. Delton Coombes who has approved proceeding with treatment today as planned.  Infusions tolerated without incident or complaint. VSS upon completion of treatment. Port flushed and deaccessed per protocol, see MAR and IV flowsheet for details. Discharged in satisfactory condition with follow up instructions.

## 2020-08-22 NOTE — Patient Instructions (Signed)
[facility] at [facility] Discharge Instructions  Labs drawn peripherally today   Thank you for choosing [facility] at [facility] to provide your oncology and hematology care.  To afford each patient quality time with our provider, please arrive at least 15 minutes before your scheduled appointment time.   If you have a lab appointment with the Cancer Center please come in thru the Main Entrance and check in at the main information desk.  You need to re-schedule your appointment should you arrive 10 or more minutes late.  We strive to give you quality time with our providers, and arriving late affects you and other patients whose appointments are after yours.  Also, if you no show three or more times for appointments you may be dismissed from the clinic at the providers discretion.     Again, thank you for choosing [facility].  Our hope is that these requests will decrease the amount of time that you wait before being seen by our physicians.       _____________________________________________________________  Should you have questions after your visit to [facility], please contact our office at [phone] and follow the prompts.  Our office hours are 8:00 a.m. and 4:30 p.m. Monday - Friday.  Please note that voicemails left after 4:00 p.m. may not be returned until the following business day.  We are closed weekends and major holidays.  You do have access to a nurse 24-7, just call the main number to the clinic [phone] and do not press any options, hold on the line and a nurse will answer the phone.    For prescription refill requests, have your pharmacy contact our office and allow 72 hours.    Due to Covid, you will need to wear a mask upon entering the hospital. If you do not have a mask, a mask will be given to you at the Main Entrance upon arrival. For doctor visits, patients may have 1 support person age 18 or  older with them. For treatment visits, patients can not have anyone with them due to social distancing guidelines and our immunocompromised population.     

## 2020-08-22 NOTE — Progress Notes (Signed)
Patient was assessed by Dr. Katragadda and labs have been reviewed.  Patient is okay to proceed with treatment today. Primary RN and pharmacy aware.   

## 2020-08-22 NOTE — Patient Instructions (Signed)
[facility] Discharge Instructions for Patients Receiving Chemotherapy   Beginning January 23rd 2017 lab work for the Cancer Center will be done in the  Main lab at [facility] on 1st floor. If you have a lab appointment with the Cancer Center please come in thru the  Main Entrance and check in at the main information desk   Today you received the following chemotherapy agents Keytruda  To help prevent nausea and vomiting after your treatment, we encourage you to take your nausea medication   If you develop nausea and vomiting, or diarrhea that is not controlled by your medication, call the clinic.  The clinic phone number is [phone]. Office hours are Monday-Friday 8:30am-5:00pm.  BELOW ARE SYMPTOMS THAT SHOULD BE REPORTED IMMEDIATELY:  *FEVER GREATER THAN 101.0 F  *CHILLS WITH OR WITHOUT FEVER  NAUSEA AND VOMITING THAT IS NOT CONTROLLED WITH YOUR NAUSEA MEDICATION  *UNUSUAL SHORTNESS OF BREATH  *UNUSUAL BRUISING OR BLEEDING  TENDERNESS IN MOUTH AND THROAT WITH OR WITHOUT PRESENCE OF ULCERS  *URINARY PROBLEMS  *BOWEL PROBLEMS  UNUSUAL RASH Items with * indicate a potential emergency and should be followed up as soon as possible. If you have an emergency after office hours please contact your primary care physician or go to the nearest emergency department.  Please call the clinic during office hours if you have any questions or concerns.   You may also contact the Patient Navigator at [phone] should you have any questions or need assistance in obtaining follow up care.      Resources For Cancer Patients and their Caregivers ? American Cancer Society: Can assist with transportation, wigs, general needs, runs Look Good Feel Better.        1-888-227-6333 ? Cancer Care: Provides financial assistance, online support groups, medication/co-pay assistance.  1-800-813-HOPE (4673) ? [facility] Assists [place] cancer  patients and their families through emotional , educational and financial support.  336-427-4357 ? [place] DSS Where to apply for food stamps, Medicaid and utility assistance. 336-342-1394 ? [facility]: Transportation to medical appointments. 336-347-2287 ? Social Security Administration: May apply for disability if have a Stage IV cancer. 336-342-7796 1-800-772-1213 ? [place] Aging, Disability and Transit Services: Assists with nutrition, care and transit needs. 336-349-2343          

## 2020-09-02 ENCOUNTER — Ambulatory Visit (INDEPENDENT_AMBULATORY_CARE_PROVIDER_SITE_OTHER): Payer: Medicaid Other | Admitting: Internal Medicine

## 2020-09-02 ENCOUNTER — Encounter: Payer: Self-pay | Admitting: Internal Medicine

## 2020-09-02 ENCOUNTER — Other Ambulatory Visit: Payer: Self-pay

## 2020-09-02 VITALS — BP 99/54 | HR 64 | Ht 62.0 in | Wt 111.6 lb

## 2020-09-02 DIAGNOSIS — R0789 Other chest pain: Secondary | ICD-10-CM

## 2020-09-02 DIAGNOSIS — R0602 Shortness of breath: Secondary | ICD-10-CM

## 2020-09-02 DIAGNOSIS — R1310 Dysphagia, unspecified: Secondary | ICD-10-CM | POA: Diagnosis not present

## 2020-09-02 LAB — LIPID PANEL
Chol/HDL Ratio: 5.1 ratio — ABNORMAL HIGH (ref 0.0–4.4)
Cholesterol, Total: 147 mg/dL (ref 100–199)
HDL: 29 mg/dL — ABNORMAL LOW (ref >39)
LDL Chol Calc (NIH): 104 mg/dL — ABNORMAL HIGH (ref 0–99)
Triglycerides: 73 mg/dL (ref 0–149)
VLDL Cholesterol Cal: 14 mg/dL (ref 5–40)

## 2020-09-02 MED ORDER — PANTOPRAZOLE SODIUM 40 MG PO TBEC: 40.0000 mg | DELAYED_RELEASE_TABLET | Freq: Every day | ORAL | 2 refills | Status: DC

## 2020-09-02 NOTE — Patient Instructions (Signed)
Medication Instructions:  Your physician has recommended you make the following change in your medication:  1.) start pantoprazole (Protonix) 40 mg - one tablet every morning  *If you need a refill on your cardiac medications before your next appointment, please call your pharmacy*   Lab Work: Today: lipids  If you have labs (blood work) drawn today and your tests are completely normal, you will receive your results only by: Marland Kitchen MyChart Message (if you have MyChart) OR . A paper copy in the mail If you have any lab test that is abnormal or we need to change your treatment, we will call you to review the results.   Testing/Procedures: Your physician has requested that you have an echocardiogram. Echocardiography is a painless test that uses sound waves to create images of your heart. It provides your doctor with information about the size and shape of your heart and how well your heart's chambers and valves are working. This procedure takes approximately one hour. There are no restrictions for this procedure.     Follow-Up: Follow up with your physician will depend on test results.   Other Instructions You have been referred to [facility]

## 2020-09-02 NOTE — Progress Notes (Signed)
Cardiology Office Note   Date:  09/02/2020   ID:  Donna Munoz, DOB Nov 09, 1976, MRN [id number]  PCP:  Patient, No Pcp Per  Cardiologist:   Dorris Carnes, MD   Patient referred for evaluation of CP     History of Present Illness: Donna Munoz is a 44 y.o. female with a history of melanoma    Followed in oncology by Dr Delton Coombes    The pt says sometimes duing day will have CP with activity  Sharp  Last time it happended 2 days ago sharp pain in chest    Sometimes when laying down at night chest will be heavy,SOB   Has to sit  Occasoinally has heaviness with doing things but usually with laying down Does have some problems with food getting stuck  Will have stuck feeling in throat  She had always had to cut her food into small bits     Current Meds  Medication Sig  . gabapentin (NEURONTIN) 100 MG capsule Take by mouth daily.   Marland Kitchen levothyroxine (SYNTHROID) 50 MCG tablet Take 1 tablet (50 mcg total) by mouth daily before breakfast.  . lidocaine-prilocaine (EMLA) cream Apply a small amount to port a cath site (do not rub in) and cover with plastic wrap 1 hour prior to chemotherapy appointments (Patient taking differently: as needed. Apply a small amount to port a cath site (do not rub in) and cover with plastic wrap 1 hour prior to chemotherapy appointments)  . ondansetron (ZOFRAN ODT) 4 MG disintegrating tablet Take 1 tablet (4 mg total) by mouth every 8 (eight) hours as needed for nausea or vomiting.  Marland Kitchen oxyCODONE (OXY IR/ROXICODONE) 5 MG immediate release tablet Take 1 tablet (5 mg total) by mouth every 6 (six) hours as needed for severe pain.  . Pembrolizumab (KEYTRUDA IV) Inject into the vein every 21 ( twenty-one) days.     Allergies:   Patient has no known allergies.   Past Medical History:  Diagnosis Date  . Anemia   . Anxiety   . Depression   . GERD (gastroesophageal reflux disease)   . Headache   . Shingles   . Tobacco abuse 05/06/2020    Past Surgical History:    Procedure Laterality Date  . AMPUTATION TOE Right 03/07/2020   Procedure: RIGHT THIRD TOE AMPUTATION;  Surgeon: [name], MD;  Location: [facility];  Service: General;  Laterality: Right;  . BREAST LUMPECTOMY  age 65  . CESAREAN SECTION  01/06/2006  . CESAREAN SECTION  10/15/2016  . DENTAL SURGERY  2019  . INGUINAL HERNIA REPAIR Right 01/30/2020   Procedure: RIGHT INGUINAL HERNIA REPAIR WITH  MESH;  Surgeon: [name], MD;  Location: [facility];  Service: General;  Laterality: Right;  . INSERTION OF MESH Right 01/30/2020   Procedure: INSERTION OF MESH;  Surgeon: [name], MD;  Location: [facility];  Service: General;  Laterality: Right;  . IRRIGATION AND DEBRIDEMENT ABSCESS Right 03/07/2020   Procedure: Aspiration of Right Groin Seroma;  Surgeon: [name], MD;  Location: [facility];  Service: General;  Laterality: Right;  . MELANOMA EXCISION WITH SENTINEL LYMPH NODE BIOPSY Right 01/30/2020   Procedure: WIDE LOWER EXCISION RIGHT THIRD TOE MELANOMA EXCISION WITH SENTINEL LYMPH NODE BIOPSY;  Surgeon: [name], MD;  Location: [facility];  Service: General;  Laterality: Right;  . PORTACATH PLACEMENT N/A 03/07/2020   Procedure: INSERTION PORT-A-CATH;  Surgeon: [name], MD;  Location: [facility];  Service: General;  Laterality: Left     Social History:  The patient  reports that she has been smoking. She has been smoking about 1.00 pack per day. She has never used smokeless tobacco. She reports current drug use. Drug: Marijuana. She reports that she does not drink alcohol.   Family History:  The patient's family history includes Cancer in her maternal grandfather, maternal grandmother, mother, and paternal grandmother; Healthy in her brother, sister, sister, and son; Heart attack in her maternal grandmother; Heart disease in her mother; Ovarian cysts in her sister; Psoriasis in her daughter.    ROS:  Please see the history of present illness.  All other systems are reviewed and  Negative to the above problem except as noted.    PHYSICAL EXAM: VS:  BP (!) 99/54   Pulse 64   Ht 5\' 2"  (1.575 m)   Wt 111 lb 9.6 oz (50.6 kg)   SpO2 98%   BMI 20.41 kg/m   GEN: Thin 44 yo in no acute distress  HEENT: normal  Neck: no JVD, carotid bruits, Cardiac: RRR; no murmurs  ,no LE edema  Respiratory:  clear to auscultation bilaterally, normal work of breathing GI: soft, nontender, nondistended, + BS  No hepatomegaly  MS: no deformity Moving all extremities   Skin: warm and dry, no rash Neuro:  Strength and sensation are intact Psych: euthymic mood, full affect   EKG:  EKG is ordered today.  SR 64 bpm  INcomplete RBBB     Lipid Panel No results found for: CHOL, TRIG, HDL, CHOLHDL, VLDL, LDLCALC, LDLDIRECT    Wt Readings from Last 3 Encounters:  09/02/20 111 lb 9.6 oz (50.6 kg)  08/22/20 108 lb 9.6 oz (49.3 kg)  07/30/20 108 lb 9.6 oz (49.3 kg)      ASSESSMENT AND PLAN:  1 Chest pain  2 types  One is sharp, sounds more musculoskeletal   The other is a pressure that sounds possibly GI in origin   I do think she needs a GI evaluation   2  Dyspnea    I would set the pt up for an echo to eval systolic and diastolic function    3  GI   Hx is suggestive for narrowings    4  Lipids  WIll get today     F/U will be based on test reuslts   Current medicines are reviewed at length with the patient today.  The patient does not have concerns regarding medicines.  Signed, Dorris Carnes, MD  09/02/2020 9:29 AM    [address], [place], [place]  [address] Phone: [phone]; Fax: [phone]

## 2020-09-09 ENCOUNTER — Other Ambulatory Visit (HOSPITAL_COMMUNITY): Payer: Self-pay | Admitting: General Surgery

## 2020-09-09 DIAGNOSIS — C439 Malignant melanoma of skin, unspecified: Secondary | ICD-10-CM

## 2020-09-11 ENCOUNTER — Ambulatory Visit (HOSPITAL_COMMUNITY)
Admission: RE | Admit: 2020-09-11 | Discharge: 2020-09-11 | Disposition: A | Discharge status: Home / Self Care | Payer: Medicaid Other | Source: Ambulatory Visit | Attending: General Surgery | Admitting: General Surgery

## 2020-09-11 ENCOUNTER — Other Ambulatory Visit: Payer: Self-pay

## 2020-09-11 DIAGNOSIS — C439 Malignant melanoma of skin, unspecified: Secondary | ICD-10-CM | POA: Diagnosis not present

## 2020-09-11 DIAGNOSIS — Z452 Encounter for adjustment and management of vascular access device: Secondary | ICD-10-CM | POA: Insufficient documentation

## 2020-09-11 DIAGNOSIS — T82828A Fibrosis of vascular prosthetic devices, implants and grafts, initial encounter: Secondary | ICD-10-CM | POA: Diagnosis not present

## 2020-09-11 HISTORY — PX: IR CV LINE INJECTION: IMG2294

## 2020-09-11 MED ORDER — LIDOCAINE HCL 1 % IJ SOLN
INTRAMUSCULAR | Status: AC
  Filled 2020-09-11: qty 20

## 2020-09-11 MED ORDER — IOHEXOL 300 MG/ML  SOLN
50.0000 mL | Freq: Once | INTRAMUSCULAR | Status: AC | PRN
  Administered 2020-09-11: 10 mL via INTRAVENOUS

## 2020-09-11 MED ORDER — HEPARIN SOD (PORK) LOCK FLUSH 100 UNIT/ML IV SOLN
INTRAVENOUS | Status: AC
  Filled 2020-09-11: qty 5

## 2020-09-12 ENCOUNTER — Ambulatory Visit (HOSPITAL_COMMUNITY)
Admission: RE | Admit: 2020-09-12 | Discharge: 2020-09-12 | Disposition: A | Discharge status: Home / Self Care | Payer: Medicaid Other | Source: Ambulatory Visit | Attending: Hematology | Admitting: Hematology

## 2020-09-12 ENCOUNTER — Other Ambulatory Visit: Payer: Self-pay

## 2020-09-12 ENCOUNTER — Ambulatory Visit (HOSPITAL_COMMUNITY): Payer: Medicaid Other | Admitting: Hematology

## 2020-09-12 ENCOUNTER — Other Ambulatory Visit (HOSPITAL_COMMUNITY): Payer: Medicaid Other

## 2020-09-12 ENCOUNTER — Other Ambulatory Visit: Payer: Self-pay | Admitting: Student

## 2020-09-12 ENCOUNTER — Ambulatory Visit (HOSPITAL_COMMUNITY): Payer: Medicaid Other

## 2020-09-12 ENCOUNTER — Other Ambulatory Visit (HOSPITAL_COMMUNITY): Payer: Self-pay | Admitting: Hematology

## 2020-09-12 DIAGNOSIS — F17210 Nicotine dependence, cigarettes, uncomplicated: Secondary | ICD-10-CM | POA: Insufficient documentation

## 2020-09-12 DIAGNOSIS — Z452 Encounter for adjustment and management of vascular access device: Secondary | ICD-10-CM | POA: Diagnosis not present

## 2020-09-12 DIAGNOSIS — T82598A Other mechanical complication of other cardiac and vascular devices and implants, initial encounter: Secondary | ICD-10-CM | POA: Diagnosis not present

## 2020-09-12 DIAGNOSIS — Z85820 Personal history of malignant melanoma of skin: Secondary | ICD-10-CM | POA: Diagnosis not present

## 2020-09-12 DIAGNOSIS — C439 Malignant melanoma of skin, unspecified: Secondary | ICD-10-CM

## 2020-09-12 HISTORY — PX: IR REMOVAL TUN ACCESS W/ PORT W/O FL MOD SED: IMG2290

## 2020-09-12 HISTORY — PX: IR IMAGING GUIDED PORT INSERTION: IMG5740

## 2020-09-12 MED ORDER — CEFAZOLIN SODIUM-DEXTROSE 2-4 GM/100ML-% IV SOLN
INTRAVENOUS | Status: AC
  Administered 2020-09-12: 2 g via INTRAVENOUS
  Filled 2020-09-12: qty 100

## 2020-09-12 MED ORDER — HEPARIN SOD (PORK) LOCK FLUSH 100 UNIT/ML IV SOLN
INTRAVENOUS | Status: AC
  Filled 2020-09-12: qty 5

## 2020-09-12 MED ORDER — LIDOCAINE HCL (PF) 1 % IJ SOLN
INTRAMUSCULAR | Status: AC | PRN
  Administered 2020-09-12: 10 mL

## 2020-09-12 MED ORDER — SODIUM CHLORIDE 0.9 % IV SOLN: INTRAVENOUS | Status: DC

## 2020-09-12 MED ORDER — MIDAZOLAM HCL 2 MG/2ML IJ SOLN
INTRAMUSCULAR | Status: AC
  Filled 2020-09-12: qty 2

## 2020-09-12 MED ORDER — LIDOCAINE HCL 1 % IJ SOLN
INTRAMUSCULAR | Status: AC
  Filled 2020-09-12: qty 20

## 2020-09-12 MED ORDER — CEFAZOLIN SODIUM-DEXTROSE 2-4 GM/100ML-% IV SOLN: 2.0000 g | INTRAVENOUS | Status: AC

## 2020-09-12 MED ORDER — FENTANYL CITRATE (PF) 100 MCG/2ML IJ SOLN
INTRAMUSCULAR | Status: AC | PRN
Start: 2020-09-12 — End: 2020-09-12
  Administered 2020-09-12: 25 ug via INTRAVENOUS
  Administered 2020-09-12: 50 ug via INTRAVENOUS

## 2020-09-12 MED ORDER — FENTANYL CITRATE (PF) 100 MCG/2ML IJ SOLN
INTRAMUSCULAR | Status: AC
  Filled 2020-09-12: qty 2

## 2020-09-12 MED ORDER — LIDOCAINE-EPINEPHRINE 1 %-1:100000 IJ SOLN
INTRAMUSCULAR | Status: AC
  Filled 2020-09-12: qty 1

## 2020-09-12 MED ORDER — LIDOCAINE-EPINEPHRINE 1 %-1:100000 IJ SOLN
INTRAMUSCULAR | Status: AC | PRN
  Administered 2020-09-12: 20 mL via INTRADERMAL

## 2020-09-12 MED ORDER — MIDAZOLAM HCL 2 MG/2ML IJ SOLN
INTRAMUSCULAR | Status: AC | PRN
  Administered 2020-09-12 (×2): 1 mg via INTRAVENOUS

## 2020-09-12 NOTE — Procedures (Signed)
Interventional Radiology Procedure Note  Procedure: removal of the left subclavian port with revision to a LT IJ PORT saving the existing pocket    Complications: None  Estimated Blood Loss:  min  Findings: Tip svcra    Tamera Punt, MD

## 2020-09-12 NOTE — Discharge Instructions (Addendum)
Implanted Port Removal, Care After This sheet gives you information about how to care for yourself after your procedure. Your health care provider may also give you more specific instructions. If you have problems or questions, contact your health care provider. What can I expect after the procedure? After the procedure, it is common to have:  Soreness or pain near your incision.  Some swelling or bruising near your incision. Follow these instructions at home: Medicines  Take over-the-counter and prescription medicines only as told by your health care provider.  If you were prescribed an antibiotic medicine, take it as told by your health care provider. Do not stop taking the antibiotic even if you start to feel better. Bathing  Do not take baths, swim, or use a hot tub until your health care provider approves. Ask your health care provider if you can take showers. You may only be allowed to take sponge baths. Incision care   Follow instructions from your health care provider about how to take care of your incision. Make sure you: ? Wash your hands with soap and water before you change your bandage (dressing). If soap and water are not available, use hand sanitizer. ? Change your dressing as told by your health care provider. ? Keep your dressing dry. ? Leave stitches (sutures), skin glue, or adhesive strips in place. These skin closures may need to stay in place for 2 weeks or longer. If adhesive strip edges start to loosen and curl up, you may trim the loose edges. Do not remove adhesive strips completely unless your health care provider tells you to do that.  Check your incision area every day for signs of infection. Check for: ? More redness, swelling, or pain. ? More fluid or blood. ? Warmth. ? Pus or a bad smell. Driving   Do not drive for 24 hours if you were given a medicine to help you relax (sedative) during your procedure.  If you did not receive a sedative, ask your  health care provider when it is safe to drive. Activity  Return to your normal activities as told by your health care provider. Ask your health care provider what activities are safe for you.  Do not lift anything that is heavier than 10 lb (4.5 kg), or the limit that you are told, until your health care provider says that it is safe.  Do not do activities that involve lifting your arms over your head. General instructions  Do not use any products that contain nicotine or tobacco, such as cigarettes and e-cigarettes. These can delay healing. If you need help quitting, ask your health care provider.  Keep all follow-up visits as told by your health care provider. This is important. Contact a health care provider if:  You have more redness, swelling, or pain around your incision.  You have more fluid or blood coming from your incision.  Your incision feels warm to the touch.  You have pus or a bad smell coming from your incision.  You have pain that is not relieved by your pain medicine. Get help right away if you have:  A fever or chills.  Chest pain.  Difficulty breathing. Summary  After the procedure, it is common to have pain, soreness, swelling, or bruising near your incision.  If you were prescribed an antibiotic medicine, take it as told by your health care provider. Do not stop taking the antibiotic even if you start to feel better.  Do not drive for 24 hours   if you were given a sedative during your procedure.  Return to your normal activities as told by your health care provider. Ask your health care provider what activities are safe for you. This information is not intended to replace advice given to you by your health care provider. Make sure you discuss any questions you have with your health care provider. Document Revised: 12/09/2017 Document Reviewed: 12/09/2017 Elsevier Patient Education  2020 [employer] Implanted Port Insertion, Care After This sheet  gives you information about how to care for yourself after your procedure. Your health care provider may also give you more specific instructions. If you have problems or questions, contact your health care provider. What can I expect after the procedure? After the procedure, it is common to have:  Discomfort at the port insertion site.  Bruising on the skin over the port. This should improve over 3-4 days. Follow these instructions at home: Port care  After your port is placed, you will get a manufacturer's information card. The card has information about your port. Keep this card with you at all times.  Take care of the port as told by your health care provider. Ask your health care provider if you or a family member can get training for taking care of the port at home. A home health care nurse may also take care of the port.  Make sure to remember what type of port you have. Incision care      Follow instructions from your health care provider about how to take care of your port insertion site. Make sure you: ? Wash your hands with soap and water before and after you change your bandage (dressing). If soap and water are not available, use hand sanitizer. ? Change your dressing as told by your health care provider. ? Leave stitches (sutures), skin glue, or adhesive strips in place. These skin closures may need to stay in place for 2 weeks or longer. If adhesive strip edges start to loosen and curl up, you may trim the loose edges. Do not remove adhesive strips completely unless your health care provider tells you to do that.  Check your port insertion site every day for signs of infection. Check for: ? Redness, swelling, or pain. ? Fluid or blood. ? Warmth. ? Pus or a bad smell. Activity  Return to your normal activities as told by your health care provider. Ask your health care provider what activities are safe for you.  Do not lift anything that is heavier than 10 lb (4.5 kg),  or the limit that you are told, until your health care provider says that it is safe. General instructions  Take over-the-counter and prescription medicines only as told by your health care provider.  Do not take baths, swim, or use a hot tub until your health care provider approves. Ask your health care provider if you may take showers. You may only be allowed to take sponge baths.  Do not drive for 24 hours if you were given a sedative during your procedure.  Wear a medical alert bracelet in case of an emergency. This will tell any health care providers that you have a port.  Keep all follow-up visits as told by your health care provider. This is important. Contact a health care provider if:  You cannot flush your port with saline as directed, or you cannot draw blood from the port.  You have a fever or chills.  You have redness, swelling, or pain around your   port insertion site.  You have fluid or blood coming from your port insertion site.  Your port insertion site feels warm to the touch.  You have pus or a bad smell coming from the port insertion site. Get help right away if:  You have chest pain or shortness of breath.  You have bleeding from your port that you cannot control. Summary  Take care of the port as told by your health care provider. Keep the manufacturer's information card with you at all times.  Change your dressing as told by your health care provider.  Contact a health care provider if you have a fever or chills or if you have redness, swelling, or pain around your port insertion site.  Keep all follow-up visits as told by your health care provider. This information is not intended to replace advice given to you by your health care provider. Make sure you discuss any questions you have with your health care provider. Document Revised: 05/24/2018 Document Reviewed: 05/24/2018 Elsevier Patient Education  2020 [employer] Moderate Conscious Sedation,  Adult Sedation is the use of medicines to promote relaxation and relieve discomfort and anxiety. Moderate conscious sedation is a type of sedation. Under moderate conscious sedation, you are less alert than normal, but you are still able to respond to instructions, touch, or both. Moderate conscious sedation is used during short medical and dental procedures. It is milder than deep sedation, which is a type of sedation under which you cannot be easily woken up. It is also milder than general anesthesia, which is the use of medicines to make you unconscious. Moderate conscious sedation allows you to return to your regular activities sooner. Tell a health care provider about:  Any allergies you have.  All medicines you are taking, including vitamins, herbs, eye drops, creams, and over-the-counter medicines.  Use of steroids (by mouth or creams).  Any problems you or family members have had with sedatives and anesthetic medicines.  Any blood disorders you have.  Any surgeries you have had.  Any medical conditions you have, such as sleep apnea.  Whether you are pregnant or may be pregnant.  Any use of cigarettes, alcohol, marijuana, or street drugs. What are the risks? Generally, this is a safe procedure. However, problems may occur, including:  Getting too much medicine (oversedation).  Nausea.  Allergic reaction to medicines.  Trouble breathing. If this happens, a breathing tube may be used to help with breathing. It will be removed when you are awake and breathing on your own.  Heart trouble.  Lung trouble. What happens before the procedure? Staying hydrated Follow instructions from your health care provider about hydration, which may include:  Up to 2 hours before the procedure - you may continue to drink clear liquids, such as water, clear fruit juice, black coffee, and plain tea. Eating and drinking restrictions Follow instructions from your health care provider about  eating and drinking, which may include:  8 hours before the procedure - stop eating heavy meals or foods such as meat, fried foods, or fatty foods.  6 hours before the procedure - stop eating light meals or foods, such as toast or cereal.  6 hours before the procedure - stop drinking milk or drinks that contain milk.  2 hours before the procedure - stop drinking clear liquids. Medicine Ask your health care provider about:  Changing or stopping your regular medicines. This is especially important if you are taking diabetes medicines or blood thinners.  Taking medicines   such as aspirin and ibuprofen. These medicines can thin your blood. Do not take these medicines before your procedure if your health care provider instructs you not to.  Tests and exams  You will have a physical exam.  You may have blood tests done to show: ? How well your kidneys and liver are working. ? How well your blood can clot. General instructions  Plan to have someone take you home from the hospital or clinic.  If you will be going home right after the procedure, plan to have someone with you for 24 hours. What happens during the procedure?  An IV tube will be inserted into one of your veins.  Medicine to help you relax (sedative) will be given through the IV tube.  The medical or dental procedure will be performed. What happens after the procedure?  Your blood pressure, heart rate, breathing rate, and blood oxygen level will be monitored often until the medicines you were given have worn off.  Do not drive for 24 hours. This information is not intended to replace advice given to you by your health care provider. Make sure you discuss any questions you have with your health care provider. Document Revised: 10/08/2017 Document Reviewed: 02/15/2016 Elsevier Patient Education  2020 [employer] Inc[employer]   

## 2020-09-12 NOTE — Progress Notes (Signed)
Discharge instructions reviewed with pt and her fiance Legrand Como (via telephone) both voice understanding.

## 2020-09-12 NOTE — H&P (Signed)
Chief Complaint: Patient was seen in consultation today for port removal/replacement.  Referring Physician(s): Firefighter  Supervising Physician: Daryll Brod  Patient Status: [facility] - Out-pt  History of Present Illness: Donna Munoz is a 44 y.o. 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 Donna Munoz was first diagnosed with malignant melanoma in January of this year and subsequently underwent local excision on 01/30/20 with Dr. Barry Dienes. Pathology from this procedure showed positive margins and she then underwent right third toe amputation at the PIP as well as left subclavian port placement ([place], [place]) on 03/07/20 with Dr. Barry Dienes. 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 After discussion with Dr. Laurence Ferrari decision was made to proceed with removal of existing port and placement of a new left sided port via the left internal jugular vein for which she presents today.  Donna Munoz denies any complaints today, her port was last used successfully 3 weeks ago and she is planned for another 6 months of systemic treatment to occur every 3 weeks.   Past Medical History:  Diagnosis Date  . Anemia   . Anxiety   . Depression   . GERD (gastroesophageal reflux disease)   . Headache   . Shingles   . Tobacco abuse 05/06/2020    Past Surgical History:  Procedure Laterality Date  . AMPUTATION TOE Right 03/07/2020   Procedure: RIGHT THIRD TOE AMPUTATION;  Surgeon: [name], MD;  Location: [place];  Service: General;   Laterality: Right;  . BREAST LUMPECTOMY  age 6  . CESAREAN SECTION  01/06/2006  . CESAREAN SECTION  10/15/2016  . DENTAL SURGERY  2019  . INGUINAL HERNIA REPAIR Right 01/30/2020   Procedure: RIGHT INGUINAL HERNIA REPAIR WITH  MESH;  Surgeon: [name], MD;  Location: [place];  Service: General;  Laterality: Right;  . INSERTION OF MESH Right 01/30/2020   Procedure: INSERTION OF MESH;  Surgeon: [name], MD;  Location: [place];  Service: General;  Laterality: Right;  . IR CV LINE INJECTION  09/11/2020  . IRRIGATION AND DEBRIDEMENT ABSCESS Right 03/07/2020   Procedure: Aspiration of Right Groin Seroma;  Surgeon: [name], MD;  Location: [place];  Service: General;  Laterality: Right;  . MELANOMA EXCISION WITH SENTINEL LYMPH NODE BIOPSY Right 01/30/2020   Procedure: WIDE LOWER EXCISION RIGHT THIRD TOE MELANOMA EXCISION WITH SENTINEL LYMPH NODE BIOPSY;  Surgeon: [name], MD;  Location: [place];  Service: General;  Laterality: Right;  . PORTACATH PLACEMENT N/A 03/07/2020   Procedure: INSERTION PORT-A-CATH;  Surgeon: [name], MD;  Location: [place];  Service: General;  Laterality: Left    Allergies: Patient has no known allergies.  Medications: Prior to Admission medications   Medication Sig Start Date End Date Taking? Authorizing Provider  gabapentin (NEURONTIN) 100 MG capsule Take by mouth daily.  05/22/20   [provider]  levothyroxine (SYNTHROID) 50 MCG tablet Take 1 tablet (50 mcg total) by mouth daily before breakfast. 07/09/20   Derek Jack, MD  lidocaine-prilocaine (EMLA) cream Apply a small  amount to port a cath site (do not rub in) and cover with plastic wrap 1 hour prior to chemotherapy appointments Patient taking differently: as needed. Apply a small amount to port a cath site (do not rub in) and cover with plastic wrap 1 hour prior to chemotherapy appointments 03/26/20   Derek Jack, MD    ondansetron (ZOFRAN ODT) 4 MG disintegrating tablet Take 1 tablet (4 mg total) by mouth every 8 (eight) hours as needed for nausea or vomiting. 03/29/20   Lockamy, Randi L, NP-C  oxyCODONE (OXY IR/ROXICODONE) 5 MG immediate release tablet Take 1 tablet (5 mg total) by mouth every 6 (six) hours as needed for severe pain. 03/07/20   [name], MD  pantoprazole (PROTONIX) 40 MG tablet Take 1 tablet (40 mg total) by mouth daily. 09/02/20   Fay Records, MD  Pembrolizumab [facility] IV) Inject into the vein every 21 ( twenty-one) days. 03/25/20   [provider]     Family History  Problem Relation Age of Onset  . Heart disease Mother   . Cancer Mother   . Ovarian cysts Sister   . Healthy Brother   . Cancer Maternal Grandmother   . Heart attack Maternal Grandmother   . Cancer Maternal Grandfather   . Cancer Paternal Grandmother   . Healthy Sister   . Healthy Sister   . Psoriasis Daughter   . Healthy Son     Social History   Socioeconomic History  . Marital status: Legally Separated    Spouse name: Not on file  . Number of children: 2  . Years of education: Not on file  . Highest education level: Not on file  Occupational History  . Occupation: unemployed d/t COVID  Tobacco Use  . Smoking status: Current Every Day Smoker    Packs/day: 1.00  . Smokeless tobacco: Never Used  Vaping Use  . Vaping Use: Never used  Substance and Sexual Activity  . Alcohol use: No  . Drug use: Yes    Types: Marijuana  . Sexual activity: Yes  Other Topics Concern  . Not on file  Social History Narrative  . Not on file   Social Determinants of Health   Financial Resource Strain: High Risk  . Difficulty of Paying Living Expenses: Hard  Food Insecurity: No Food Insecurity  . Worried About Charity fundraiser in the Last Year: Never true  . Ran Out of Food in the Last Year: Never true  Transportation Needs: No Transportation Needs  . Lack of Transportation (Medical): No  . Lack of  Transportation (Non-Medical): No  Physical Activity: Inactive  . Days of Exercise per Week: 0 days  . Minutes of Exercise per Session: 0 min  Stress: Stress Concern Present  . Feeling of Stress : Very much  Social Connections: Socially Isolated  . Frequency of Communication with Friends and Family: Twice a week  . Frequency of Social Gatherings with Friends and Family: Never  . Attends Religious Services: 1 to 4 times per year  . Active Member of Clubs or Organizations: No  . Attends Archivist Meetings: Never  . Marital Status: Separated     Review of Systems: A 12 point ROS discussed and pertinent positives are indicated in the HPI above.  All other systems are negative.  Review of Systems  Constitutional: Negative for chills and fever.  Respiratory: Negative for cough and shortness of breath.   Cardiovascular: Negative for chest pain.  Gastrointestinal: Negative for abdominal  pain, diarrhea, nausea and vomiting.  Musculoskeletal: Negative for back pain.  Neurological: Negative for dizziness and headaches.    Vital Signs: BP 106/68   Pulse 67   Temp 98.2 F (36.8 C) (Oral)   Ht 5\' 2"  (1.575 m)   Wt 111 lb (50.3 kg)   SpO2 100%   BMI 20.30 kg/m   Physical Exam Vitals reviewed.  Constitutional:      General: She is not in acute distress. HENT:     Head: Normocephalic.     Mouth/Throat:     Mouth: Mucous membranes are moist.     Pharynx: Oropharynx is clear. No oropharyngeal exudate or posterior oropharyngeal erythema.  Cardiovascular:     Rate and Rhythm: Normal rate and regular rhythm.     Comments: (+) left chest port in place Pulmonary:     Effort: Pulmonary effort is normal.     Breath sounds: Normal breath sounds.  Abdominal:     General: There is no distension.     Palpations: Abdomen is soft.     Tenderness: There is no abdominal tenderness.  Skin:    General: Skin is warm and dry.  Neurological:     Mental Status: She is alert and oriented  to person, place, and time.  Psychiatric:        Mood and Affect: Mood normal.        Behavior: Behavior normal.        Thought Content: Thought content normal.        Judgment: Judgment normal.      MD Evaluation Airway: WNL Heart: WNL Abdomen: WNL Chest/ Lungs: WNL ASA  Classification: 3 Mallampati/Airway Score: One   Imaging: MR Brain W Wo Contrast  Result Date: 08/19/2020 CLINICAL DATA:  44 year old female with headache and weakness for 5 months. History of melanoma of the right 3rd toe status post amputation. EXAM: MRI HEAD WITHOUT AND WITH CONTRAST TECHNIQUE: Multiplanar, multiecho pulse sequences of the brain and surrounding structures were obtained without and with intravenous contrast. CONTRAST:  46mL GADAVIST GADOBUTROL 1 MMOL/ML IV SOLN COMPARISON:  Brain MRI 02/24/2020. FINDINGS: Brain: Normal cerebral volume. No restricted diffusion to suggest acute infarction. No midline shift, mass effect, evidence of mass lesion, ventriculomegaly, extra-axial collection or acute intracranial hemorrhage. Cervicomedullary junction and pituitary are within normal limits. No intrinsic T1 hyperintense or hemorrhagic lesions in the brain. No abnormal enhancement identified. Stable gray and white matter signal throughout the brain. Mildly to moderately advanced for age nonspecific subcortical white matter T2 and FLAIR hyperintense foci which are mostly in the anterior and superior frontal lobes. No cortical encephalomalacia. Deep gray nuclei, brainstem and cerebellum remain negative. Tiny developmental venous anomaly suspected in the right cerebellum and stable on series 18, image 13. No dural thickening. Vascular: Major intracranial vascular flow voids are stable. The major dural venous sinuses are enhancing and appear to be patent. Skull and upper cervical spine: Negative visible cervical spine. Visible bone marrow signal is stable and within normal limits. Sinuses/Orbits: Stable and negative. Other:  Mastoids remain clear. Visible internal auditory structures appear normal. Normal stylomastoid foramina. Scalp and face soft tissues appear negative. IMPRESSION: 1. Stable.  No metastatic disease or acute intracranial abnormality. 2. Nonspecific cerebral white matter signal changes as described in April. Electronically Signed   By: Genevie Ann M.D.   On: 08/19/2020 20:46   IR CV Line Injection  Result Date: 09/11/2020 INDICATION: 44 year old female with a left subclavian approach port catheter. The catheter was placed  in April of 2021 and is had difficult with aspiration and injection since shortly after placement. She presents today for port injection and further evaluation. EXAM: CENTRAL VENOUS CATHETER MEDICATIONS: None. ANESTHESIA/SEDATION: None. FLUOROSCOPY TIME:  Fluoroscopy Time: 0 minutes 18 seconds (6 mGy). COMPLICATIONS: None immediate. PROCEDURE: Informed written consent was obtained from the patient after a thorough discussion of the procedural risks, benefits and alternatives. All questions were addressed. Maximal Sterile Barrier Technique was utilized including caps, mask, sterile gowns, sterile gloves, sterile drape, hand hygiene and skin antiseptic. A timeout was performed prior to the initiation of the procedure. Initial imaging demonstrates a left subclavian single-lumen power injectable port catheter. The catheter tip overlies the distal SVC. The catheter was accessed in sterile fashion. Line injection was then performed under digital subtraction angiography. No evidence of extravasation or line fracture. There are 2 issues with the port catheter. First, a rounded filling defect is present within the reservoir lumen consistent with a thrombus. Second, there is a near completely occlusive fibrin sheath in casing the distal 5 cm of the catheter. Contrast material does not visibly exit the catheter tip the tracks back along the catheter before spilling out the top of the fibrin sheath. IMPRESSION: 1.  Left subclavian approach single-lumen power injectable port catheter with the tip in the distal SVC. 2. 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 3. Near completely obstructing fibrin sheath along the distal 5 cm of the catheter. Options were discussed at length with the patient. She states that she has approximately 6 months of chemotherapy left for treatment of her malignant melanoma. With such a relatively short duration, I advised her to consider revision of the existing portacatheter with replacement of a left-sided port utilizing the same pocket location. The new port catheter would be rerouted via the internal jugular vein. This will address both the thrombus within the reservoir as well as the existing fibrin sheath. She agrees. We will schedule her for this procedure. Signed, Criselda Peaches, MD, [facility] Electronically Signed   By: Jacqulynn Cadet M.D.   On: 09/11/2020 16:23    Labs:  CBC: Recent Labs    06/17/20 1214 07/09/20 1153 07/30/20 1231 08/22/20 1117  WBC 4.8 4.9 4.5 5.3  HGB 12.7 12.8 13.1 12.9  HCT 40.3 39.1 40.5 39.0  PLT 207 203 232 221    COAGS: No results for input(s): INR, APTT in the last 8760 hours.  BMP: Recent Labs    05/27/20 1213 05/27/20 1213 06/17/20 1214 07/09/20 1153 07/30/20 1231 08/22/20 1117  NA 139   < > 136 137 138 136  K 4.1   < > 4.0 4.4 3.9 3.4*  CL 104   < > 105 105 104 102  CO2 27   < > 24 24 26 25   GLUCOSE 105*   < > 85 88 105* 92  BUN 8   < > 10 5* 9 6  CALCIUM 10.0   < > 9.4 9.5 9.6 9.5  CREATININE 0.63   < > 0.65 0.64 0.74 0.66  GFRNONAA >60   < > >60 >60 >60 >60  GFRAA >60  --  >60 >60 >60  --    < > = values in this interval not displayed.    LIVER FUNCTION TESTS: Recent Labs    06/17/20 1214 07/09/20 1153 07/30/20 1231 08/22/20 1117  BILITOT 0.2* 0.2* 0.6 0.8  AST 23 19 21 21   ALT 21 16 17  17  ALKPHOS 68 62 67 62  PROT 6.9  7.0 7.0 7.0  ALBUMIN 4.0 3.9 4.0 4.1    TUMOR MARKERS: No results for input(s): AFPTM, CEA, CA199, CHROMGRNA in the last 8760 hours.  Assessment and Plan:  44 y/o F with history of malignant melanoma of the right third toe s/p amputation and currently receiving systemic therapy who presents today for removal of existing surgically placed port and replacement with new port due to poor function, fibrin sheath development and chronic thrombus.   Patient has been NPO since midnight, no current anticoagulation/antiplatelet.  Risks and benefits of image-guided Port-a-catheter placement were discussed with the patient including, but not limited to bleeding, infection, pneumothorax, or fibrin sheath development and need for additional procedures.  All of the patient's questions were answered, patient is agreeable to proceed.  Consent signed and in chart.  Thank you for this interesting consult.  I greatly enjoyed meeting Marlise Fahr and look forward to participating in their care.  A copy of this report was sent to the requesting provider on this date.  Electronically Signed: Joaquim Nam, PA-C 09/12/2020, 9:33 AM   I spent a total of  30 Minutes  in face to face in clinical consultation, greater than 50% of which was counseling/coordinating care for port removal/replacement.

## 2020-09-17 ENCOUNTER — Other Ambulatory Visit (HOSPITAL_COMMUNITY): Payer: Medicaid Other

## 2020-09-17 ENCOUNTER — Inpatient Hospital Stay (HOSPITAL_COMMUNITY): Payer: Medicaid Other | Admitting: Hematology

## 2020-09-17 ENCOUNTER — Inpatient Hospital Stay (HOSPITAL_COMMUNITY): Payer: Medicaid Other

## 2020-09-18 ENCOUNTER — Other Ambulatory Visit (HOSPITAL_COMMUNITY): Payer: Medicaid Other

## 2020-09-20 ENCOUNTER — Other Ambulatory Visit: Payer: Self-pay

## 2020-09-20 ENCOUNTER — Ambulatory Visit (HOSPITAL_COMMUNITY): Payer: Medicaid Other | Attending: Internal Medicine

## 2020-09-20 DIAGNOSIS — R0602 Shortness of breath: Secondary | ICD-10-CM | POA: Insufficient documentation

## 2020-09-20 DIAGNOSIS — R0789 Other chest pain: Secondary | ICD-10-CM | POA: Insufficient documentation

## 2020-09-20 LAB — ECHOCARDIOGRAM COMPLETE
Area-P 1/2: 4.06 cm2
S' Lateral: 2.6 cm

## 2020-09-24 ENCOUNTER — Telehealth: Payer: Self-pay | Admitting: Internal Medicine

## 2020-09-24 ENCOUNTER — Inpatient Hospital Stay (HOSPITAL_COMMUNITY): Payer: Medicaid Other | Attending: Hematology

## 2020-09-24 ENCOUNTER — Other Ambulatory Visit: Payer: Self-pay

## 2020-09-24 ENCOUNTER — Inpatient Hospital Stay (HOSPITAL_BASED_OUTPATIENT_CLINIC_OR_DEPARTMENT_OTHER): Payer: Medicaid Other | Admitting: Hematology

## 2020-09-24 VITALS — BP 102/67 | HR 74 | Temp 97.0°F | Resp 17 | Wt 109.1 lb

## 2020-09-24 DIAGNOSIS — C439 Malignant melanoma of skin, unspecified: Secondary | ICD-10-CM

## 2020-09-24 DIAGNOSIS — E039 Hypothyroidism, unspecified: Secondary | ICD-10-CM | POA: Insufficient documentation

## 2020-09-24 DIAGNOSIS — Z79899 Other long term (current) drug therapy: Secondary | ICD-10-CM | POA: Diagnosis not present

## 2020-09-24 DIAGNOSIS — D509 Iron deficiency anemia, unspecified: Secondary | ICD-10-CM | POA: Insufficient documentation

## 2020-09-24 DIAGNOSIS — Z5111 Encounter for antineoplastic chemotherapy: Secondary | ICD-10-CM | POA: Diagnosis present

## 2020-09-24 DIAGNOSIS — Z79890 Hormone replacement therapy (postmenopausal): Secondary | ICD-10-CM | POA: Insufficient documentation

## 2020-09-24 DIAGNOSIS — C4371 Malignant melanoma of right lower limb, including hip: Secondary | ICD-10-CM | POA: Insufficient documentation

## 2020-09-24 LAB — CBC WITH DIFFERENTIAL/PLATELET
Abs Immature Granulocytes: 0.01 10*3/uL (ref 0.00–0.07)
Basophils Absolute: 0 10*3/uL (ref 0.0–0.1)
Basophils Relative: 1 %
Eosinophils Absolute: 0.1 10*3/uL (ref 0.0–0.5)
Eosinophils Relative: 3 %
HCT: 39.7 % (ref 36.0–46.0)
Hemoglobin: 13.3 g/dL (ref 12.0–15.0)
Immature Granulocytes: 0 %
Lymphocytes Relative: 32 %
Lymphs Abs: 1.7 10*3/uL (ref 0.7–4.0)
MCH: 32.6 pg (ref 26.0–34.0)
MCHC: 33.5 g/dL (ref 30.0–36.0)
MCV: 97.3 fL (ref 80.0–100.0)
Monocytes Absolute: 0.4 10*3/uL (ref 0.1–1.0)
Monocytes Relative: 7 %
Neutro Abs: 3 10*3/uL (ref 1.7–7.7)
Neutrophils Relative %: 57 %
Platelets: 261 10*3/uL (ref 150–400)
RBC: 4.08 MIL/uL (ref 3.87–5.11)
RDW: 12.7 % (ref 11.5–15.5)
WBC: 5.3 10*3/uL (ref 4.0–10.5)
nRBC: 0 % (ref 0.0–0.2)

## 2020-09-24 LAB — COMPREHENSIVE METABOLIC PANEL
ALT: 15 U/L (ref 0–44)
AST: 19 U/L (ref 15–41)
Albumin: 4.1 g/dL (ref 3.5–5.0)
Alkaline Phosphatase: 62 U/L (ref 38–126)
Anion gap: 6 (ref 5–15)
BUN: 9 mg/dL (ref 6–20)
CO2: 24 mmol/L (ref 22–32)
Calcium: 9.5 mg/dL (ref 8.9–10.3)
Chloride: 105 mmol/L (ref 98–111)
Creatinine, Ser: 0.57 mg/dL (ref 0.44–1.00)
GFR, Estimated: >60 mL/min (ref >60)
Glucose, Bld: 89 mg/dL (ref 70–99)
Potassium: 3.6 mmol/L (ref 3.5–5.1)
Sodium: 135 mmol/L (ref 135–145)
Total Bilirubin: 0.6 mg/dL (ref 0.3–1.2)
Total Protein: 7.4 g/dL (ref 6.5–8.1)

## 2020-09-24 LAB — TSH: TSH: 5.312 u[IU]/mL — ABNORMAL HIGH (ref 0.350–4.500)

## 2020-09-24 MED ORDER — HEPARIN SOD (PORK) LOCK FLUSH 100 UNIT/ML IV SOLN
500.0000 [IU] | Freq: Once | INTRAVENOUS | Status: AC
  Administered 2020-09-24: 500 [IU] via INTRAVENOUS

## 2020-09-24 MED ORDER — SODIUM CHLORIDE 0.9% FLUSH
10.0000 mL | Freq: Once | INTRAVENOUS | Status: AC
  Administered 2020-09-24: 10 mL via INTRAVENOUS

## 2020-09-24 NOTE — Patient Instructions (Signed)
[place] at [facility] Discharge Instructions  You were seen today by Dr. Delton Coombes. He went over your recent results and scans. You will receive your treatment on Friday. You will be scheduled for a CT scan of your chest and abdomen before your next visit. Dr. Delton Coombes will see you back in 3 weeks for labs and follow up.   Thank you for choosing [place] at [facility] to provide your oncology and hematology care.  To afford each patient quality time with our provider, please arrive at least 15 minutes before your scheduled appointment time.   If you have a lab appointment with the [facility] please come in thru the Main Entrance and check in at the main information desk  You need to re-schedule your appointment should you arrive 10 or more minutes late.  We strive to give you quality time with our providers, and arriving late affects you and other patients whose appointments are after yours.  Also, if you no show three or more times for appointments you may be dismissed from the clinic at the providers discretion.     Again, thank you for choosing [facility].  Our hope is that these requests will decrease the amount of time that you wait before being seen by our physicians.       _____________________________________________________________  Should you have questions after your visit to [facility], please contact our office at [phone] between the hours of 8:00 a.m. and 4:30 p.m.  Voicemails left after 4:00 p.m. will not be returned until the following business day.  For prescription refill requests, have your pharmacy contact our office and allow 72 hours.    Cancer Center Support Programs:   > Cancer Support Group  2nd Tuesday of the month 1pm-2pm, Journey Room

## 2020-09-24 NOTE — Progress Notes (Signed)
[name], [place] [address]   CLINIC:  Medical Oncology/Hematology  PCP:  Patient, No Pcp Per None None   REASON FOR VISIT:  Follow-up for malignant melanoma  PRIOR THERAPY: Resection of right third toe on 01/30/2020 with right third toe amputation at PIP joint on 03/07/2020  NGS Results: BRAF V600E and V600K negative  CURRENT THERAPY: Adjuvant Keytruda every 3 weeks  BRIEF ONCOLOGIC HISTORY:  Oncology History  Melanoma of skin ([place])  12/11/2019 Initial Diagnosis   Melanoma of skin ([place])   02/19/2020 Genetic Testing   BRAF Mutation Analysis     03/25/2020 -  Chemotherapy   The patient had pembrolizumab (KEYTRUDA) 200 mg in sodium chloride 0.9 % 50 mL chemo infusion, 200 mg, Intravenous, Once, 8 of 12 cycles Administration: 200 mg (03/25/2020), 200 mg (05/06/2020), 200 mg (05/27/2020), 200 mg (07/09/2020), 200 mg (04/15/2020), 200 mg (06/17/2020), 200 mg (07/30/2020), 200 mg (08/22/2020)  for chemotherapy treatment.      CANCER STAGING: Cancer Staging Melanoma of skin ([place]) Staging form: Melanoma of the Skin, AJCC 8th Edition - Clinical stage from 02/08/2020: Stage III (cT2b, cN2a, cM0) - Unsigned   INTERVAL HISTORY:  [name], a 44 y.o. female, returns for routine follow-up and consideration for next cycle of chemotherapy. Donna Munoz was last seen on 08/22/2020.  Due for cycle #9 of Keytruda today.   Overall, she tells me she has been feeling okay. She had her port replaced in her left chest to the left internal jugular vein. She denies having any more CP, though she reports having orthopnea and SOB with activity. She reports having a sensation of something in the back of her throat when she coughs or swallows; she denies having any recent infection. She takes levothyroxine on an empty stomach daily.   REVIEW OF SYSTEMS:  Review of Systems  Constitutional: Positive for appetite change (50%) and fatigue (75%).  HENT:   Positive for sore  throat (w/ drinking or coughing).   Respiratory: Positive for shortness of breath (w/ exertion & orthopnea).   Cardiovascular: Negative for chest pain.  Gastrointestinal: Positive for abdominal pain (7/10 pain) and nausea.  Neurological: Positive for headaches (lasting 3 days).  All other systems reviewed and are negative.   PAST MEDICAL/SURGICAL HISTORY:  Past Medical History:  Diagnosis Date  . Anemia   . Anxiety   . Depression   . GERD (gastroesophageal reflux disease)   . Headache   . Shingles   . Tobacco abuse 05/06/2020   Past Surgical History:  Procedure Laterality Date  . AMPUTATION TOE Right 03/07/2020   Procedure: RIGHT THIRD TOE AMPUTATION;  Surgeon: [name], MD;  Location: [facility];  Service: General;  Laterality: Right;  . BREAST LUMPECTOMY  age 60  . CESAREAN SECTION  01/06/2006  . CESAREAN SECTION  10/15/2016  . DENTAL SURGERY  2019  . INGUINAL HERNIA REPAIR Right 01/30/2020   Procedure: RIGHT INGUINAL HERNIA REPAIR WITH  MESH;  Surgeon: [name], MD;  Location: [facility];  Service: General;  Laterality: Right;  . INSERTION OF MESH Right 01/30/2020   Procedure: INSERTION OF MESH;  Surgeon: [name], MD;  Location: [facility];  Service: General;  Laterality: Right;  . IR CV LINE INJECTION  09/11/2020  . IR IMAGING GUIDED PORT INSERTION  09/12/2020  . IR REMOVAL TUN ACCESS W/ PORT W/O FL MOD SED  09/12/2020  . IRRIGATION AND DEBRIDEMENT ABSCESS Right 03/07/2020  Procedure: Aspiration of Right Groin Seroma;  Surgeon: [name], MD;  Location: [facility];  Service: General;  Laterality: Right;  . MELANOMA EXCISION WITH SENTINEL LYMPH NODE BIOPSY Right 01/30/2020   Procedure: WIDE LOWER EXCISION RIGHT THIRD TOE MELANOMA EXCISION WITH SENTINEL LYMPH NODE BIOPSY;  Surgeon: [name], MD;  Location: [facility];  Service: General;  Laterality: Right;  . PORTACATH PLACEMENT N/A 03/07/2020   Procedure: INSERTION  PORT-A-CATH;  Surgeon: [name], MD;  Location: [facility];  Service: General;  Laterality: Left    SOCIAL HISTORY:  Social History   Socioeconomic History  . Marital status: Legally Separated    Spouse name: Not on file  . Number of children: 2  . Years of education: Not on file  . Highest education level: Not on file  Occupational History  . Occupation: unemployed d/t COVID  Tobacco Use  . Smoking status: Current Every Day Smoker    Packs/day: 1.00  . Smokeless tobacco: Never Used  Vaping Use  . Vaping Use: Never used  Substance and Sexual Activity  . Alcohol use: No  . Drug use: Yes    Types: Marijuana  . Sexual activity: Yes  Other Topics Concern  . Not on file  Social History Narrative  . Not on file   Social Determinants of Health   Financial Resource Strain: High Risk  . Difficulty of Paying Living Expenses: Hard  Food Insecurity: No Food Insecurity  . Worried About Charity fundraiser in the Last Year: Never true  . Ran Out of Food in the Last Year: Never true  Transportation Needs: No Transportation Needs  . Lack of Transportation (Medical): No  . Lack of Transportation (Non-Medical): No  Physical Activity: Inactive  . Days of Exercise per Week: 0 days  . Minutes of Exercise per Session: 0 min  Stress: Stress Concern Present  . Feeling of Stress : Very much  Social Connections: Socially Isolated  . Frequency of Communication with Friends and Family: Twice a week  . Frequency of Social Gatherings with Friends and Family: Never  . Attends Religious Services: 1 to 4 times per year  . Active Member of Clubs or Organizations: No  . Attends Archivist Meetings: Never  . Marital Status: Separated  Intimate Partner Violence: Not At Risk  . Fear of Current or Ex-Partner: No  . Emotionally Abused: No  . Physically Abused: No  . Sexually Abused: No    FAMILY HISTORY:  Family History  Problem Relation Age of Onset  . Heart disease Mother   .  Cancer Mother   . Ovarian cysts Sister   . Healthy Brother   . Cancer Maternal Grandmother   . Heart attack Maternal Grandmother   . Cancer Maternal Grandfather   . Cancer Paternal Grandmother   . Healthy Sister   . Healthy Sister   . Psoriasis Daughter   . Healthy Son     CURRENT MEDICATIONS:  Current Outpatient Medications  Medication Sig Dispense Refill  . gabapentin (NEURONTIN) 100 MG capsule Take by mouth daily.     Marland Kitchen levothyroxine (SYNTHROID) 50 MCG tablet Take 1 tablet (50 mcg total) by mouth daily before breakfast. 30 tablet 1  . lidocaine-prilocaine (EMLA) cream Apply a small amount to port a cath site (do not rub in) and cover with plastic wrap 1 hour prior to chemotherapy appointments (Patient taking differently: as needed. Apply a small amount to port a cath site (do not rub in)  and cover with plastic wrap 1 hour prior to chemotherapy appointments) 30 g 3  . ondansetron (ZOFRAN ODT) 4 MG disintegrating tablet Take 1 tablet (4 mg total) by mouth every 8 (eight) hours as needed for nausea or vomiting. 30 tablet 0  . oxyCODONE (OXY IR/ROXICODONE) 5 MG immediate release tablet Take 1 tablet (5 mg total) by mouth every 6 (six) hours as needed for severe pain. 40 tablet 0  . pantoprazole (PROTONIX) 40 MG tablet Take 1 tablet (40 mg total) by mouth daily. 30 tablet 2  . Pembrolizumab (KEYTRUDA IV) Inject into the vein every 21 ( twenty-one) days.     No current facility-administered medications for this visit.    ALLERGIES:  No Known Allergies  PHYSICAL EXAM:  Performance status (ECOG): 0 - Asymptomatic  Vitals:   09/24/20 1453  BP: 102/67  Pulse: 74  Resp: 17  Temp: (!) 97 F (36.1 C)  SpO2: 98%   Wt Readings from Last 3 Encounters:  09/24/20 109 lb 1.6 oz (49.5 kg)  09/12/20 111 lb (50.3 kg)  09/02/20 111 lb 9.6 oz (50.6 kg)   Physical Exam Vitals reviewed.  Constitutional:      Appearance: Normal appearance.  HENT:     Mouth/Throat:     Lips: No lesions.      Mouth: No oral lesions.     Dentition: No gum lesions.     Tongue: No lesions.     Palate: No lesions.     Pharynx: No oropharyngeal exudate or posterior oropharyngeal erythema.     Tonsils: No tonsillar exudate.  Cardiovascular:     Rate and Rhythm: Normal rate and regular rhythm.     Pulses: Normal pulses.     Heart sounds: Normal heart sounds.  Pulmonary:     Effort: Pulmonary effort is normal.     Breath sounds: Normal breath sounds.  Chest:     Comments: Port-a-Cath in L chest Abdominal:     Palpations: Abdomen is soft. There is no hepatomegaly, splenomegaly or mass.     Tenderness: There is no abdominal tenderness.     Hernia: No hernia is present.  Musculoskeletal:     Right lower leg: No edema.     Left lower leg: No edema.  Lymphadenopathy:     Cervical: No cervical adenopathy.     Upper Body:     Right upper body: No supraclavicular, axillary or pectoral adenopathy.     Left upper body: No supraclavicular, axillary or pectoral adenopathy.     Lower Body: No right inguinal adenopathy. No left inguinal adenopathy.  Neurological:     General: No focal deficit present.     Mental Status: She is alert and oriented to person, place, and time.  Psychiatric:        Mood and Affect: Mood normal.        Behavior: Behavior normal.     LABORATORY DATA:  I have reviewed the labs as listed.  CBC Latest Ref Rng & Units 09/24/2020 08/22/2020 07/30/2020  WBC 4.0 - 10.5 K/uL 5.3 5.3 4.5  Hemoglobin 12.0 - 15.0 g/dL 13.3 12.9 13.1  Hematocrit 36 - 46 % 39.7 39.0 40.5  Platelets 150 - 400 K/uL 261 221 232   CMP Latest Ref Rng & Units 09/24/2020 08/22/2020 07/30/2020  Glucose 70 - 99 mg/dL 89 92 105(H)  BUN 6 - 20 mg/dL _0 Creatinine 0.44 - 1.00 mg/dL 0.57 0.66 0.74  Sodium 135 - 145 mmol/L 135  136 138  Potassium 3.5 - 5.1 mmol/L 3.6 3.4(L) 3.9  Chloride 98 - 111 mmol/L 105 102 104  CO2 22 - 32 mmol/L _0 Calcium 8.9 - 10.3 mg/dL 9.5 9.5 9.6  Total Protein 6.5  - 8.1 g/dL 7.4 7.0 7.0  Total Bilirubin 0.3 - 1.2 mg/dL 0.6 0.8 0.6  Alkaline Phos 38 - 126 U/L 62 62 67  AST 15 - 41 U/L _1 ALT 0 - 44 U/L _2 DIAGNOSTIC IMAGING:  I have independently reviewed the scans and discussed with the patient. IR REMOVAL TUN ACCESS W/ PORT W/O FL MOD SED  Result Date: 09/12/2020 CLINICAL DATA:  Metastatic melanoma, malfunctioning left subclavian power port catheter EXAM: REMOVAL OF THE LEFT SUBCLAVIAN POWER PORT CATHETER PLACEMENT OF A NEW LEFT IJ POWER PORT CATHETER UTILIZING THE EXISTING SUBCUTANEOUS POCKET Date:  09/12/2020 09/12/2020 12:41 pm Radiologist:  Jerilynn Mages. Daryll Brod, MD Guidance:  Ultrasound fluoroscopic MEDICATIONS: Ancef 2 g; The antibiotic was administered within an appropriate time interval prior to skin puncture. ANESTHESIA/SEDATION: Versed 2.0 mg IV; Fentanyl 75 mcg IV; Moderate Sedation Time:  32 The patient was continuously monitored during the procedure by the interventional radiology nurse under my direct supervision. FLUOROSCOPY TIME:  0 minutes, 24 seconds (1 mGy) COMPLICATIONS: None immediate. CONTRAST:  None. PROCEDURE: Informed consent was obtained from the patient following explanation of the procedure, risks, benefits and alternatives. The patient understands, agrees and consents for the procedure. All questions were addressed. A time out was performed. Maximal barrier sterile technique utilized including caps, mask, sterile gowns, sterile gloves, large sterile drape, hand hygiene, and 2% chlorhexidine scrub. Left subclavian port removal: Initially, the left subclavian poorly functioning port catheter was removed. This was done under sterile conditions and local anesthesia. An incision was made along the existing scar. The subclavian port catheter was removed with sharp and blunt dissection. Catheter and tubing removed entirely. Pocket flushed with saline. Hemostasis obtained. Left IJ port catheter insertion utilizing the existing  subcutaneous pocket: Under sterile conditions and local anesthesia, left internal jugular micropuncture venous access was performed. Access was performed with ultrasound. Images were obtained for documentation of the left internal jugular vein. A guide wire was inserted followed by a transitional dilator. This allowed insertion of a guide wire and catheter into the IVC. Measurements were obtained from the SVC / RA junction back to the left IJ venotomy site. Within the existing left chest subcutaneous pocket, the new the port catheter was secured in the pocket with retention sutures. The tubing was tunneled subcutaneously to the new left IJ venotomy site and inserted into the SVC/RA junction through a valved peel-away sheath. Position was confirmed with fluoroscopy. Images were obtained for documentation. The patient tolerated the procedure well. No immediate complications. Incisions were closed in a two layer fashion with 4 - 0 Vicryl suture. Dermabond was applied to the skin. The port catheter was accessed, blood was aspirated followed by saline and heparin flushes. Needle was removed. A dry sterile dressing was applied. IMPRESSION: Successful removal of the left subclavian port catheter. Successful insertion of a new left IJ power port catheter utilizing the existing subcutaneous left chest pocket. Tip SVC RA junction. Ready for use. Electronically Signed   By: Jerilynn Mages.  Shick M.D.   On: 09/12/2020 12:56   IR CV Line Injection  Result Date: 09/11/2020 INDICATION: 44 year old female with a left subclavian approach port catheter. The catheter was placed in April of 2021 and  is had difficult with aspiration and injection since shortly after placement. She presents today for port injection and further evaluation. EXAM: CENTRAL VENOUS CATHETER MEDICATIONS: None. ANESTHESIA/SEDATION: None. FLUOROSCOPY TIME:  Fluoroscopy Time: 0 minutes 18 seconds (6 mGy). COMPLICATIONS: None immediate. PROCEDURE: Informed written consent  was obtained from the patient after a thorough discussion of the procedural risks, benefits and alternatives. All questions were addressed. Maximal Sterile Barrier Technique was utilized including caps, mask, sterile gowns, sterile gloves, sterile drape, hand hygiene and skin antiseptic. A timeout was performed prior to the initiation of the procedure. Initial imaging demonstrates a left subclavian single-lumen power injectable port catheter. The catheter tip overlies the distal SVC. The catheter was accessed in sterile fashion. Line injection was then performed under digital subtraction angiography. No evidence of extravasation or line fracture. There are 2 issues with the port catheter. First, a rounded filling defect is present within the reservoir lumen consistent with a thrombus. Second, there is a near completely occlusive fibrin sheath in casing the distal 5 cm of the catheter. Contrast material does not visibly exit the catheter tip the tracks back along the catheter before spilling out the top of the fibrin sheath. IMPRESSION: 1. Left subclavian approach single-lumen power injectable port catheter with the tip in the distal SVC. 2. 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 3. Near completely obstructing fibrin sheath along the distal 5 cm of the catheter. Options were discussed at length with the patient. She states that she has approximately 6 months of chemotherapy left for treatment of her malignant melanoma. With such a relatively short duration, I advised her to consider revision of the existing portacatheter with replacement of a left-sided port utilizing the same pocket location. The new port catheter would be rerouted via the internal jugular vein. This will address both the thrombus within the reservoir as well as the existing fibrin sheath. She agrees. We will schedule her for this procedure. Signed, Criselda Peaches, MD, [facility]  [facility] Electronically Signed   By: Jacqulynn Cadet M.D.   On: 09/11/2020 16:23   ECHOCARDIOGRAM COMPLETE  Result Date: 09/20/2020    ECHOCARDIOGRAM REPORT   Patient Name:   BRENNAN KARAM   Date of Exam: 09/20/2020 Medical Rec #:  [id number]     Height:       62.0 in Accession #:    [id number]    Weight:       111.0 lb Date of Birth:  [birth year]     BSA:          1.488 m Patient Age:    60 years      BP:           99/54 mmHg Patient Gender: F             HR:           77 bpm. Exam Location:  [address] Procedure: 2D Echo, Cardiac Doppler and Color Doppler Indications:    R06.02 SOB  History:        Patient has no prior history of Echocardiogram examinations.                 Signs/Symptoms:Chest Pain and Shortness of Breath; Risk                 Factors:Current Smoker.  Sonographer:    Coralyn Helling RDCS Referring Phys: [id number] PAULA V ROSS IMPRESSIONS  1. Left ventricular ejection fraction, by estimation, is 65  to 70%. The left ventricle has normal function. The left ventricle has no regional wall motion abnormalities. Left ventricular diastolic parameters were normal.  2. Right ventricular systolic function is normal. The right ventricular size is normal.  3. Trivial mitral valve regurgitation.  4. The aortic valve is normal in structure. Aortic valve regurgitation is not visualized.  5. The inferior vena cava is normal in size with greater than 50% respiratory variability, suggesting right atrial pressure of 3 mmHg. FINDINGS  Left Ventricle: Left ventricular ejection fraction, by estimation, is 65 to 70%. The left ventricle has normal function. The left ventricle has no regional wall motion abnormalities. The left ventricular internal cavity size was normal in size. There is  no left ventricular hypertrophy. Left ventricular diastolic parameters were normal. Right Ventricle: The right ventricular size is normal. No increase in right ventricular wall thickness. Right ventricular systolic function  is normal. Left Atrium: Left atrial size was normal in size. Right Atrium: Right atrial size was normal in size. Pericardium: There is no evidence of pericardial effusion. Mitral Valve: The mitral valve is abnormal. There is mild thickening of the mitral valve leaflet(s). Trivial mitral valve regurgitation. Tricuspid Valve: The tricuspid valve is normal in structure. Tricuspid valve regurgitation is mild. Aortic Valve: The aortic valve is normal in structure. Aortic valve regurgitation is not visualized. Pulmonic Valve: The pulmonic valve was normal in structure. Pulmonic valve regurgitation is not visualized. Aorta: The aortic root and ascending aorta are structurally normal, with no evidence of dilitation. Venous: The inferior vena cava is normal in size with greater than 50% respiratory variability, suggesting right atrial pressure of 3 mmHg. IAS/Shunts: No atrial level shunt detected by color flow Doppler.  LEFT VENTRICLE PLAX 2D LVIDd:         4.20 cm Diastology LVIDs:         2.60 cm LV e' medial:    10.60 cm/s LV PW:         0.80 cm LV E/e' medial:  8.8 LV IVS:        0.80 cm LV e' lateral:   15.60 cm/s                        LV E/e' lateral: 6.0  RIGHT VENTRICLE             IVC RV S prime:     16.00 cm/s  IVC diam: 1.80 cm TAPSE (M-mode): 2.2 cm RVSP:           20.3 mmHg LEFT ATRIUM             Index       RIGHT ATRIUM           Index LA diam:        3.10 cm 2.08 cm/m  RA Pressure: 3.00 mmHg LA Vol (A2C):   41.9 ml 28.15 ml/m RA Area:     10.30 cm LA Vol (A4C):   30.0 ml 20.15 ml/m RA Volume:   21.10 ml  14.18 ml/m LA Biplane Vol: 35.5 ml 23.85 ml/m  AORTIC VALVE LVOT Vmax:   139.00 cm/s LVOT Vmean:  79.700 cm/s LVOT VTI:    0.253 m  AORTA Ao Root diam: 2.90 cm Ao Asc diam:  2.70 cm MV E velocity: 93.50 cm/s  TRICUSPID VALVE MV A velocity: 66.20 cm/s  TR Peak grad:   17.3 mmHg MV E/A ratio:  1.41        TR Vmax:  208.00 cm/s                            Estimated RAP:  3.00 mmHg                             RVSP:           20.3 mmHg                             SHUNTS                            Systemic VTI: 0.25 m Dorris Carnes MD Electronically signed by Dorris Carnes MD Signature Date/Time: 09/20/2020/5:19:19 PM    Final    IR IMAGING GUIDED PORT INSERTION  Result Date: 09/12/2020 CLINICAL DATA:  Metastatic melanoma, malfunctioning left subclavian power port catheter EXAM: REMOVAL OF THE LEFT SUBCLAVIAN POWER PORT CATHETER PLACEMENT OF A NEW LEFT IJ POWER PORT CATHETER UTILIZING THE EXISTING SUBCUTANEOUS POCKET Date:  09/12/2020 09/12/2020 12:41 pm Radiologist:  Jerilynn Mages. Daryll Brod, MD Guidance:  Ultrasound fluoroscopic MEDICATIONS: Ancef 2 g; The antibiotic was administered within an appropriate time interval prior to skin puncture. ANESTHESIA/SEDATION: Versed 2.0 mg IV; Fentanyl 75 mcg IV; Moderate Sedation Time:  32 The patient was continuously monitored during the procedure by the interventional radiology nurse under my direct supervision. FLUOROSCOPY TIME:  0 minutes, 24 seconds (1 mGy) COMPLICATIONS: None immediate. CONTRAST:  None. PROCEDURE: Informed consent was obtained from the patient following explanation of the procedure, risks, benefits and alternatives. The patient understands, agrees and consents for the procedure. All questions were addressed. A time out was performed. Maximal barrier sterile technique utilized including caps, mask, sterile gowns, sterile gloves, large sterile drape, hand hygiene, and 2% chlorhexidine scrub. Left subclavian port removal: Initially, the left subclavian poorly functioning port catheter was removed. This was done under sterile conditions and local anesthesia. An incision was made along the existing scar. The subclavian port catheter was removed with sharp and blunt dissection. Catheter and tubing removed entirely. Pocket flushed with saline. Hemostasis obtained. Left IJ port catheter insertion utilizing the existing subcutaneous pocket: Under sterile conditions and local  anesthesia, left internal jugular micropuncture venous access was performed. Access was performed with ultrasound. Images were obtained for documentation of the left internal jugular vein. A guide wire was inserted followed by a transitional dilator. This allowed insertion of a guide wire and catheter into the IVC. Measurements were obtained from the SVC / RA junction back to the left IJ venotomy site. Within the existing left chest subcutaneous pocket, the new the port catheter was secured in the pocket with retention sutures. The tubing was tunneled subcutaneously to the new left IJ venotomy site and inserted into the SVC/RA junction through a valved peel-away sheath. Position was confirmed with fluoroscopy. Images were obtained for documentation. The patient tolerated the procedure well. No immediate complications. Incisions were closed in a two layer fashion with 4 - 0 Vicryl suture. Dermabond was applied to the skin. The port catheter was accessed, blood was aspirated followed by saline and heparin flushes. Needle was removed. A dry sterile dressing was applied. IMPRESSION: Successful removal of the left subclavian port catheter. Successful insertion of a new left IJ power port catheter utilizing the existing subcutaneous left chest pocket. Tip SVC RA junction.  Ready for use. Electronically Signed   By: Jerilynn Mages.  Shick M.D.   On: 09/12/2020 12:56     ASSESSMENT:  1. 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 Pathology did not show any residual melanoma. -CT chest and abdomen on 03/21/2020 did not show any evidence of metastatic disease. -Adjuvant immunotherapy with pembrolizumab 1 year recommended. -Pembrolizumab started on  03/25/2020. -CT CAP on 06/12/2020 did not show any evidence of metastatic disease in the chest, abdomen or pelvis. Mild emphysematous changes and respiratory bronchiolitis. -CT right foot with contrast on 06/13/2020 shows no evidence of osteomyelitis, abscess, septic arthritis or myositis. -MRI of the brain on 08/19/2020 did not show any evidence of metastatic disease.  2. Family history: -Mother died of throat cancer. Maternal grandmother had breast cancer. Maternal aunt had pancreatic cancer. Maternal grandfather had bone cancer. Maternal great grandmother died of melanoma. -She will benefit from genetic testing.  3. Right thyroid nodule: -PET scan on 12/20/2019 showed uptake in the right thyroid. -Ultrasound on 02/05/2020 did not show any suspicious nodules. -FNA on 05/01/2020 showed benign follicular nodule, Bethesda category 2.   PLAN:  1. Stage IIIb (T2B N2A) malignant melanoma of the right third toe, BRAF V6 100 E-: -She is continuing to tolerate immunotherapy without any side effects related to immunotherapy. -She had removal of the left subclavian port followed by placement of left IJ port on 09/12/2020. -This was done as a portogram on 09/11/2020 showed chronic thrombus within the reservoir lumen and fibrin sheath along the distal 5 cm of the catheter. -She was upset that she might not be getting Keytruda into her SVC.  I have reassured her that the contrast material on the portogram tracks along the catheter before spilling out the top of the fibrin sheath into the SVC.  I have also showed the images of portogram taken by radiology.  I have also offered second opinion at [facility] or [facility]. -She reports feeling of foreign body in the back of the throat.  This has been happening for the last 2 weeks. -Oropharyngeal exam did not reveal any masses or infection.  Recommend ENT evaluation if symptoms does not improve. -I have recommended restaging CT CAP with contrast. -She will  proceed with her next treatment this Friday.  She will be reevaluated in 3 weeks.  2. Microcytic anemia: -Hemoglobin today 13.3.  No Feraheme needed.  3.Hypothyroidism: -Continue Synthroid 50 mcg daily.  TSH today improved to 5.312.   Orders placed this encounter:  Orders Placed This Encounter  Procedures  . CT CHEST ABDOMEN PELVIS W CONTRAST     Derek Jack, MD [place] [phone]   I, Milinda Antis, am acting as a scribe for Dr. Sanda Linger.  I, Derek Jack MD, have reviewed the above documentation for accuracy and completeness, and I agree with the above.

## 2020-09-24 NOTE — Progress Notes (Signed)
Patients port flushed without difficulty.  Good blood return noted with no bruising or swelling noted at site.  Site was tender to touch.  Band aid applied.  VSS with discharge and left ambulatory with no s/s of distress noted

## 2020-09-24 NOTE — Telephone Encounter (Signed)
Donna Munoz is returning Michalene's call. Please advise.

## 2020-09-25 ENCOUNTER — Ambulatory Visit (HOSPITAL_COMMUNITY)
Admission: RE | Admit: 2020-09-25 | Discharge: 2020-09-25 | Disposition: A | Discharge status: Home / Self Care | Payer: Medicaid Other | Source: Ambulatory Visit | Attending: Hematology | Admitting: Hematology

## 2020-09-25 DIAGNOSIS — C439 Malignant melanoma of skin, unspecified: Secondary | ICD-10-CM | POA: Insufficient documentation

## 2020-09-25 DIAGNOSIS — J439 Emphysema, unspecified: Secondary | ICD-10-CM | POA: Diagnosis not present

## 2020-09-25 DIAGNOSIS — I700 Atherosclerosis of aorta: Secondary | ICD-10-CM | POA: Diagnosis not present

## 2020-09-25 MED ORDER — IOHEXOL 300 MG/ML  SOLN
75.0000 mL | Freq: Once | INTRAMUSCULAR | Status: AC | PRN
  Administered 2020-09-25: 75 mL via INTRAVENOUS

## 2020-09-25 NOTE — Telephone Encounter (Signed)
Returned patient's call.  VM is full.  Was unable to leave a message.

## 2020-09-25 NOTE — Telephone Encounter (Signed)
Patient returning Michalene's phone call, connected call to [facility].

## 2020-09-25 NOTE — Telephone Encounter (Signed)
Spoke with patient. Reviewed echo results. She had been having symptoms of chest pressure and SOB.  Pantoprazole was prescribed.  Her port was changed out last week and since then the chest pressure is relieved.  No SOB now but is feeling like there is something in her throat when swallowing.   Has not been set up to see GI.  She will discuss this symptoms with her other providers.    Adv we would put her in to return to cardiology in a year, sooner if she needs anything.

## 2020-09-27 ENCOUNTER — Other Ambulatory Visit: Payer: Self-pay

## 2020-09-27 ENCOUNTER — Inpatient Hospital Stay (HOSPITAL_COMMUNITY): Payer: Medicaid Other

## 2020-09-27 ENCOUNTER — Encounter (HOSPITAL_COMMUNITY): Payer: Self-pay

## 2020-09-27 VITALS — BP 90/67 | HR 56 | Temp 97.0°F | Resp 16 | Wt 111.1 lb

## 2020-09-27 DIAGNOSIS — Z5111 Encounter for antineoplastic chemotherapy: Secondary | ICD-10-CM | POA: Diagnosis not present

## 2020-09-27 DIAGNOSIS — C439 Malignant melanoma of skin, unspecified: Secondary | ICD-10-CM

## 2020-09-27 MED ORDER — HEPARIN SOD (PORK) LOCK FLUSH 100 UNIT/ML IV SOLN
500.0000 [IU] | Freq: Once | INTRAVENOUS | Status: AC | PRN
  Administered 2020-09-27: 500 [IU]

## 2020-09-27 MED ORDER — SODIUM CHLORIDE 0.9 % IV SOLN: Freq: Once | INTRAVENOUS | Status: AC

## 2020-09-27 MED ORDER — SODIUM CHLORIDE 0.9 % IV SOLN
200.0000 mg | Freq: Once | INTRAVENOUS | Status: AC
  Administered 2020-09-27: 200 mg via INTRAVENOUS
  Filled 2020-09-27: qty 8

## 2020-09-27 NOTE — Progress Notes (Signed)
Tolerated Keytruda infusion w/o adverse effects.  Alert, in no distress.  VSS.  Discharged ambulatory in stable condition.

## 2020-10-20 NOTE — Progress Notes (Deleted)
Donna Munoz, [place] [id number]   CLINIC:  Medical Oncology/Hematology  PCP:   Patient, No Pcp Per None None   REASON FOR VISIT:  Follow-up for malignant melanoma  PRIOR THERAPY: Resection of right third toe on 01/30/2020 with right third toe amputation at PIP joint on 03/07/2020  NGS Results: BRAF V600E and V600K negative  CURRENT THERAPY: Adjuvant Keytruda every 3 weeks  BRIEF ONCOLOGIC HISTORY:  Oncology History  Melanoma of skin (Donna Munoz)  12/11/2019 Initial Diagnosis   Melanoma of skin (Donna Munoz)   02/19/2020 Genetic Testing   BRAF Mutation Analysis     03/25/2020 -  Chemotherapy   The patient had pembrolizumab (KEYTRUDA) 200 mg in sodium chloride 0.9 % 50 mL chemo infusion, 200 mg, Intravenous, Once, 9 of 12 cycles Administration: 200 mg (03/25/2020), 200 mg (05/06/2020), 200 mg (05/27/2020), 200 mg (07/09/2020), 200 mg (04/15/2020), 200 mg (06/17/2020), 200 mg (07/30/2020), 200 mg (08/22/2020), 200 mg (09/27/2020)  for chemotherapy treatment.      CANCER STAGING: Cancer Staging Melanoma of skin (Donna Munoz) Staging form: Melanoma of the Skin, AJCC 8th Edition - Clinical stage from 02/08/2020: Stage III (cT2b, cN2a, cM0) - Unsigned   INTERVAL HISTORY:  Donna Munoz, a 44 y.o. female, returns for routine follow-up and consideration for next cycle of chemotherapy. Donna Munoz was last seen on 08/22/2020.  Due for cycle # 10 of Keytruda today.   Overall, she tells me she has been feeling okay. She had her port replaced in her left chest to the left internal jugular vein. She denies having any more CP, though she reports having orthopnea and SOB with activity. She reports having a sensation of something in the back of her throat when she coughs or swallows; she denies having any recent infection. She takes levothyroxine on an empty stomach daily.   REVIEW OF SYSTEMS:  Review of Systems  Constitutional: Positive for appetite change (50%) and fatigue (75%).   HENT:   Positive for sore throat (w/ drinking or coughing).   Respiratory: Positive for shortness of breath (w/ exertion & orthopnea).   Cardiovascular: Negative for chest pain.  Gastrointestinal: Positive for abdominal pain (7/10 pain) and nausea.  Neurological: Positive for headaches (lasting 3 days).  All other systems reviewed and are negative.   PAST MEDICAL/SURGICAL HISTORY:  Past Medical History:  Diagnosis Date  . Anemia   . Anxiety   . Depression   . GERD (gastroesophageal reflux disease)   . Headache   . Shingles   . Tobacco abuse 05/06/2020   Past Surgical History:  Procedure Laterality Date  . AMPUTATION TOE Right 03/07/2020   Procedure: RIGHT THIRD TOE AMPUTATION;  Surgeon: [name], MD;  Location: [place];  Service: General;  Laterality: Right;  . BREAST LUMPECTOMY  age 44  . CESAREAN SECTION  01/06/2006  . CESAREAN SECTION  10/15/2016  . DENTAL SURGERY  2019  . INGUINAL HERNIA REPAIR Right 01/30/2020   Procedure: RIGHT INGUINAL HERNIA REPAIR WITH  MESH;  Surgeon: [name], MD;  Location: [place];  Service: General;  Laterality: Right;  . INSERTION OF MESH Right 01/30/2020   Procedure: INSERTION OF MESH;  Surgeon: [name], MD;  Location: [place];  Service: General;  Laterality: Right;  . IR CV LINE INJECTION  09/11/2020  . IR IMAGING GUIDED PORT INSERTION  09/12/2020  . IR REMOVAL TUN ACCESS W/ PORT W/O FL MOD SED  09/12/2020  . IRRIGATION AND DEBRIDEMENT  ABSCESS Right 03/07/2020   Procedure: Aspiration of Right Groin Seroma;  Surgeon: [name], MD;  Location: [place];  Service: General;  Laterality: Right;  . MELANOMA EXCISION WITH SENTINEL LYMPH NODE BIOPSY Right 01/30/2020   Procedure: WIDE LOWER EXCISION RIGHT THIRD TOE MELANOMA EXCISION WITH SENTINEL LYMPH NODE BIOPSY;  Surgeon: [name], MD;  Location: [place];  Service: General;  Laterality: Right;  . PORTACATH PLACEMENT N/A 03/07/2020    Procedure: INSERTION PORT-A-CATH;  Surgeon: [name], MD;  Location: [place];  Service: General;  Laterality: Left    SOCIAL HISTORY:  Social History   Socioeconomic History  . Marital status: Legally Separated    Spouse name: Not on file  . Number of children: 2  . Years of education: Not on file  . Highest education level: Not on file  Occupational History  . Occupation: unemployed d/t COVID  Tobacco Use  . Smoking status: Current Every Day Smoker    Packs/day: 1.00  . Smokeless tobacco: Never Used  Vaping Use  . Vaping Use: Never used  Substance and Sexual Activity  . Alcohol use: No  . Drug use: Yes    Types: Marijuana  . Sexual activity: Yes  Other Topics Concern  . Not on file  Social History Narrative  . Not on file   Social Determinants of Health   Financial Resource Strain: High Risk  . Difficulty of Paying Living Expenses: Hard  Food Insecurity: No Food Insecurity  . Worried About Charity fundraiser in the Last Year: Never true  . Ran Out of Food in the Last Year: Never true  Transportation Needs: No Transportation Needs  . Lack of Transportation (Medical): No  . Lack of Transportation (Non-Medical): No  Physical Activity: Inactive  . Days of Exercise per Week: 0 days  . Minutes of Exercise per Session: 0 min  Stress: Stress Concern Present  . Feeling of Stress : Very much  Social Connections: Socially Isolated  . Frequency of Communication with Friends and Family: Twice a week  . Frequency of Social Gatherings with Friends and Family: Never  . Attends Religious Services: 1 to 4 times per year  . Active Member of Clubs or Organizations: No  . Attends Archivist Meetings: Never  . Marital Status: Separated  Intimate Partner Violence: Not At Risk  . Fear of Current or Ex-Partner: No  . Emotionally Abused: No  . Physically Abused: No  . Sexually Abused: No    FAMILY HISTORY:  Family History  Problem Relation Age of Onset  . Heart  disease Mother   . Cancer Mother   . Ovarian cysts Sister   . Healthy Brother   . Cancer Maternal Grandmother   . Heart attack Maternal Grandmother   . Cancer Maternal Grandfather   . Cancer Paternal Grandmother   . Healthy Sister   . Healthy Sister   . Psoriasis Daughter   . Healthy Son     CURRENT MEDICATIONS:  Current Outpatient Medications  Medication Sig Dispense Refill  . gabapentin (NEURONTIN) 100 MG capsule Take by mouth daily.     Marland Kitchen levothyroxine (SYNTHROID) 50 MCG tablet Take 1 tablet (50 mcg total) by mouth daily before breakfast. 30 tablet 1  . lidocaine-prilocaine (EMLA) cream Apply a small amount to port a cath site (do not rub in) and cover with plastic wrap 1 hour prior to chemotherapy appointments (Patient taking differently: as needed. Apply a small amount to port a cath  site (do not rub in) and cover with plastic wrap 1 hour prior to chemotherapy appointments) 30 g 3  . ondansetron (ZOFRAN ODT) 4 MG disintegrating tablet Take 1 tablet (4 mg total) by mouth every 8 (eight) hours as needed for nausea or vomiting. 30 tablet 0  . oxyCODONE (OXY IR/ROXICODONE) 5 MG immediate release tablet Take 1 tablet (5 mg total) by mouth every 6 (six) hours as needed for severe pain. 40 tablet 0  . pantoprazole (PROTONIX) 40 MG tablet Take 1 tablet (40 mg total) by mouth daily. 30 tablet 2  . Pembrolizumab (KEYTRUDA IV) Inject into the vein every 21 ( twenty-one) days.     No current facility-administered medications for this visit.    ALLERGIES:  No Known Allergies  PHYSICAL EXAM:  Performance status (ECOG): 0 - Asymptomatic  There were no vitals filed for this visit. Wt Readings from Last 3 Encounters:  09/27/20 111 lb 1.8 oz (50.4 kg)  09/24/20 109 lb 1.6 oz (49.5 kg)  09/12/20 111 lb (50.3 kg)   Physical Exam Vitals reviewed.  Constitutional:      Appearance: Normal appearance.  HENT:     Mouth/Throat:     Lips: No lesions.     Mouth: No oral lesions.      Dentition: No gum lesions.     Tongue: No lesions.     Palate: No lesions.     Pharynx: No oropharyngeal exudate or posterior oropharyngeal erythema.     Tonsils: No tonsillar exudate.  Cardiovascular:     Rate and Rhythm: Normal rate and regular rhythm.     Pulses: Normal pulses.     Heart sounds: Normal heart sounds.  Pulmonary:     Effort: Pulmonary effort is normal.     Breath sounds: Normal breath sounds.  Chest:  Breasts:     Right: No axillary adenopathy or supraclavicular adenopathy.     Left: No axillary adenopathy or supraclavicular adenopathy.      Comments: Port-a-Cath in L chest Abdominal:     Palpations: Abdomen is soft. There is no hepatomegaly, splenomegaly or mass.     Tenderness: There is no abdominal tenderness.     Hernia: No hernia is present.  Musculoskeletal:     Right lower leg: No edema.     Left lower leg: No edema.  Lymphadenopathy:     Cervical: No cervical adenopathy.     Upper Body:     Right upper body: No supraclavicular, axillary or pectoral adenopathy.     Left upper body: No supraclavicular, axillary or pectoral adenopathy.     Lower Body: No right inguinal adenopathy. No left inguinal adenopathy.  Neurological:     General: No focal deficit present.     Mental Status: She is alert and oriented to person, place, and time.  Psychiatric:        Mood and Affect: Mood normal.        Behavior: Behavior normal.     LABORATORY DATA:  I have reviewed the labs as listed.  CBC Latest Ref Rng & Units 09/24/2020 08/22/2020 07/30/2020  WBC 4.0 - 10.5 K/uL 5.3 5.3 4.5  Hemoglobin 12.0 - 15.0 g/dL 13.3 12.9 13.1  Hematocrit 36.0 - 46.0 % 39.7 39.0 40.5  Platelets 150 - 400 K/uL 261 221 232   CMP Latest Ref Rng & Units 09/24/2020 08/22/2020 07/30/2020  Glucose 70 - 99 mg/dL 89 92 105(H)  BUN 6 - 20 mg/dL 9 6 9   Creatinine 0.44 -  1.00 mg/dL 0.57 0.66 0.74  Sodium 135 - 145 mmol/L 135 136 138  Potassium 3.5 - 5.1 mmol/L 3.6 3.4(L) 3.9  Chloride 98  - 111 mmol/L 105 102 104  CO2 22 - 32 mmol/L 24 25 26   Calcium 8.9 - 10.3 mg/dL 9.5 9.5 9.6  Total Protein 6.5 - 8.1 g/dL 7.4 7.0 7.0  Total Bilirubin 0.3 - 1.2 mg/dL 0.6 0.8 0.6  Alkaline Phos 38 - 126 U/L 62 62 67  AST 15 - 41 U/L 19 21 21   ALT 0 - 44 U/L 15 17 17     DIAGNOSTIC IMAGING:  I have independently reviewed the scans and discussed with the patient. CT CHEST ABDOMEN PELVIS W CONTRAST  Result Date: 09/25/2020 CLINICAL DATA:  Melanoma diagnosed in December 2020. Immunotherapy ongoing. EXAM: CT CHEST, ABDOMEN, AND PELVIS WITH CONTRAST TECHNIQUE: Multidetector CT imaging of the chest, abdomen and pelvis was performed following the standard protocol during bolus administration of intravenous contrast. CONTRAST:  89m OMNIPAQUE IOHEXOL 300 MG/ML  SOLN COMPARISON:  Prior CTs 06/12/2020. FINDINGS: CT CHEST FINDINGS Cardiovascular: No significant vascular findings. Left subclavian Port-A-Cath extends to the superior cavoatrial junction. The heart size is normal. There is no pericardial effusion. Mediastinum/Nodes: There are no enlarged mediastinal, hilar or axillary lymph nodes. The thyroid gland, trachea and esophagus demonstrate no significant findings. Residual thymic tissue in the anterior mediastinum appears unchanged. Lungs/Pleura: No pleural effusion or pneumothorax. The lungs appear stable with mild emphysematous changes and scattered dependent subpleural densities. No suspicious pulmonary nodularity. Musculoskeletal/Chest wall: No chest wall mass or suspicious osseous findings. CT ABDOMEN AND PELVIS FINDINGS Hepatobiliary: The liver is normal in density without suspicious focal abnormality. No evidence of gallstones, gallbladder wall thickening or biliary dilatation. Pancreas: Unremarkable. No pancreatic ductal dilatation or surrounding inflammatory changes. Spleen: Normal in size without focal abnormality. Adrenals/Urinary Tract: Both adrenal glands appear normal. Stable probable tiny cyst  in the interpolar region of the left kidney. No evidence of renal mass, urinary tract calculus or hydronephrosis. The bladder appears unremarkable. Stomach/Bowel: No evidence of bowel wall thickening, distention or surrounding inflammatory change. Vascular/Lymphatic: There are no enlarged abdominal or pelvic lymph nodes. There are surgical clips in the right inguinal region stable small inguinal lymph nodes bilaterally. Mild aortoiliac atherosclerosis without acute vascular findings. Reproductive: The uterus and ovaries appear normal. No adnexal mass. Other: No ascites or peritoneal nodularity.  Intact abdominal wall. Musculoskeletal: No acute or significant osseous findings. IMPRESSION: 1. Stable CTS of the chest, abdomen and pelvis. No evidence of local recurrence or metastatic disease. 2. Aortic Atherosclerosis (ICD10-I70.0) and Emphysema (ICD10-J43.9). Electronically Signed   By: WRichardean SaleM.D.   On: 09/25/2020 17:07     ASSESSMENT:   1. 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 Pathology did not show any residual melanoma. -CT chest and abdomen on 03/21/2020 did not show any evidence of metastatic disease. -Adjuvant immunotherapy with pembrolizumab 1 year recommended. -Pembrolizumab started on 03/25/2020. -CT CAP on 06/12/2020 did not show any evidence of metastatic disease in the chest, abdomen or pelvis. Mild emphysematous changes and respiratory bronchiolitis. -CT right foot with contrast on 06/13/2020 shows no evidence of osteomyelitis, abscess, septic arthritis or myositis. -MRI of the brain on 08/19/2020 did not show any  evidence of metastatic disease. -- Last imaging with no concern for disease  recurrence.  2. Family history: -Mother died of throat cancer. Maternal grandmother had breast cancer. Maternal aunt had pancreatic cancer. Maternal grandfather had bone cancer. Maternal great grandmother died of melanoma. -She will benefit from genetic testing.  3. Right thyroid nodule: -PET scan on 12/20/2019 showed uptake in the right thyroid. -Ultrasound on 02/05/2020 did not show any suspicious nodules. -FNA on 05/01/2020 showed benign follicular nodule, Bethesda category 2.   PLAN:   1. Stage IIIb (T2B N2A) malignant melanoma of the right third toe, BRAF V6 100 E-: -She is continuing to tolerate immunotherapy without any side effects related to immunotherapy.  2. Microcytic anemia: -Hemoglobin today 13.3.  No Feraheme needed.  3.Hypothyroidism: -Continue Synthroid 50 mcg daily.  TSH today improved to 5.312.   Orders placed this encounter:  No orders of the defined types were placed in this encounter.

## 2020-10-21 ENCOUNTER — Inpatient Hospital Stay (HOSPITAL_COMMUNITY): Payer: Medicaid Other | Admitting: Hematology and Oncology

## 2020-10-21 ENCOUNTER — Inpatient Hospital Stay (HOSPITAL_COMMUNITY): Payer: Medicaid Other

## 2020-10-23 NOTE — Progress Notes (Deleted)
[place], [place] [id number]   CLINIC:  Medical Oncology/Hematology  PCP:   Patient, No Pcp Per None None  REASON FOR VISIT:  Follow-up for malignant melanoma  PRIOR THERAPY: Resection of right third toe on 01/30/2020 with right third toe amputation at PIP joint on 03/07/2020  NGS Results: BRAF V600E and V600K negative  CURRENT THERAPY: Adjuvant Keytruda every 3 weeks  BRIEF ONCOLOGIC HISTORY:  Oncology History  Melanoma of skin ([place])  12/11/2019 Initial Diagnosis   Melanoma of skin ([place])   02/19/2020 Genetic Testing   BRAF Mutation Analysis     03/25/2020 -  Chemotherapy   The patient had pembrolizumab (KEYTRUDA) 200 mg in sodium chloride 0.9 % 50 mL chemo infusion, 200 mg, Intravenous, Once, 9 of 12 cycles Administration: 200 mg (03/25/2020), 200 mg (05/06/2020), 200 mg (05/27/2020), 200 mg (07/09/2020), 200 mg (04/15/2020), 200 mg (06/17/2020), 200 mg (07/30/2020), 200 mg (08/22/2020), 200 mg (09/27/2020)  for chemotherapy treatment.      CANCER STAGING: Cancer Staging Melanoma of skin ([place]) Staging form: Melanoma of the Skin, AJCC 8th Edition - Clinical stage from 02/08/2020: Stage III (cT2b, cN2a, cM0) - Unsigned   INTERVAL HISTORY:  Ms. Adrianna Dudas, a 44 y.o. female, returns for routine follow-up and consideration for next cycle of chemotherapy. Aleiyah was last seen on 08/22/2020.  Due for cycle # 10 of Keytruda today.   Overall, she tells me she has been feeling okay. She had her port replaced in her left chest to the left internal jugular vein. She denies having any more CP, though she reports having orthopnea and SOB with activity. She reports having a sensation of something in the back of her throat when she coughs or swallows; she denies having any recent infection. She takes levothyroxine on an empty stomach daily.   REVIEW OF SYSTEMS:  Review of Systems  Constitutional: Positive for appetite change (50%) and fatigue (75%).   HENT:   Positive for sore throat (w/ drinking or coughing).   Respiratory: Positive for shortness of breath (w/ exertion & orthopnea).   Cardiovascular: Negative for chest pain.  Gastrointestinal: Positive for abdominal pain (7/10 pain) and nausea.  Neurological: Positive for headaches (lasting 3 days).  All other systems reviewed and are negative.   PAST MEDICAL/SURGICAL HISTORY:  Past Medical History:  Diagnosis Date  . Anemia   . Anxiety   . Depression   . GERD (gastroesophageal reflux disease)   . Headache   . Shingles   . Tobacco abuse 05/06/2020   Past Surgical History:  Procedure Laterality Date  . AMPUTATION TOE Right 03/07/2020   Procedure: RIGHT THIRD TOE AMPUTATION;  Surgeon: [name], MD;  Location: [facility];  Service: General;  Laterality: Right;  . BREAST LUMPECTOMY  age 56  . CESAREAN SECTION  01/06/2006  . CESAREAN SECTION  10/15/2016  . DENTAL SURGERY  2019  . INGUINAL HERNIA REPAIR Right 01/30/2020   Procedure: RIGHT INGUINAL HERNIA REPAIR WITH  MESH;  Surgeon: [name], MD;  Location: [facility];  Service: General;  Laterality: Right;  . INSERTION OF MESH Right 01/30/2020   Procedure: INSERTION OF MESH;  Surgeon: [name], MD;  Location: [facility];  Service: General;  Laterality: Right;  . IR CV LINE INJECTION  09/11/2020  . IR IMAGING GUIDED PORT INSERTION  09/12/2020  . IR REMOVAL TUN ACCESS W/ PORT W/O FL MOD SED  09/12/2020  . IRRIGATION AND DEBRIDEMENT ABSCESS  Right 03/07/2020   Procedure: Aspiration of Right Groin Seroma;  Surgeon: [name], MD;  Location: [facility];  Service: General;  Laterality: Right;  . MELANOMA EXCISION WITH SENTINEL LYMPH NODE BIOPSY Right 01/30/2020   Procedure: WIDE LOWER EXCISION RIGHT THIRD TOE MELANOMA EXCISION WITH SENTINEL LYMPH NODE BIOPSY;  Surgeon: [name], MD;  Location: [facility];  Service: General;  Laterality: Right;  . PORTACATH PLACEMENT N/A 03/07/2020    Procedure: INSERTION PORT-A-CATH;  Surgeon: [name], MD;  Location: [facility];  Service: General;  Laterality: Left    SOCIAL HISTORY:  Social History   Socioeconomic History  . Marital status: Legally Separated    Spouse name: Not on file  . Number of children: 2  . Years of education: Not on file  . Highest education level: Not on file  Occupational History  . Occupation: unemployed d/t COVID  Tobacco Use  . Smoking status: Current Every Day Smoker    Packs/day: 1.00  . Smokeless tobacco: Never Used  Vaping Use  . Vaping Use: Never used  Substance and Sexual Activity  . Alcohol use: No  . Drug use: Yes    Types: Marijuana  . Sexual activity: Yes  Other Topics Concern  . Not on file  Social History Narrative  . Not on file   Social Determinants of Health   Financial Resource Strain: High Risk  . Difficulty of Paying Living Expenses: Hard  Food Insecurity: No Food Insecurity  . Worried About Charity fundraiser in the Last Year: Never true  . Ran Out of Food in the Last Year: Never true  Transportation Needs: No Transportation Needs  . Lack of Transportation (Medical): No  . Lack of Transportation (Non-Medical): No  Physical Activity: Inactive  . Days of Exercise per Week: 0 days  . Minutes of Exercise per Session: 0 min  Stress: Stress Concern Present  . Feeling of Stress : Very much  Social Connections: Socially Isolated  . Frequency of Communication with Friends and Family: Twice a week  . Frequency of Social Gatherings with Friends and Family: Never  . Attends Religious Services: 1 to 4 times per year  . Active Member of Clubs or Organizations: No  . Attends Archivist Meetings: Never  . Marital Status: Separated  Intimate Partner Violence: Not At Risk  . Fear of Current or Ex-Partner: No  . Emotionally Abused: No  . Physically Abused: No  . Sexually Abused: No    FAMILY HISTORY:  Family History  Problem Relation Age of Onset  . Heart  disease Mother   . Cancer Mother   . Ovarian cysts Sister   . Healthy Brother   . Cancer Maternal Grandmother   . Heart attack Maternal Grandmother   . Cancer Maternal Grandfather   . Cancer Paternal Grandmother   . Healthy Sister   . Healthy Sister   . Psoriasis Daughter   . Healthy Son     CURRENT MEDICATIONS:  Current Outpatient Medications  Medication Sig Dispense Refill  . gabapentin (NEURONTIN) 100 MG capsule Take by mouth daily.     Marland Kitchen levothyroxine (SYNTHROID) 50 MCG tablet Take 1 tablet (50 mcg total) by mouth daily before breakfast. 30 tablet 1  . lidocaine-prilocaine (EMLA) cream Apply a small amount to port a cath site (do not rub in) and cover with plastic wrap 1 hour prior to chemotherapy appointments (Patient taking differently: as needed. Apply a small amount to port a cath site (  do not rub in) and cover with plastic wrap 1 hour prior to chemotherapy appointments) 30 g 3  . ondansetron (ZOFRAN ODT) 4 MG disintegrating tablet Take 1 tablet (4 mg total) by mouth every 8 (eight) hours as needed for nausea or vomiting. 30 tablet 0  . oxyCODONE (OXY IR/ROXICODONE) 5 MG immediate release tablet Take 1 tablet (5 mg total) by mouth every 6 (six) hours as needed for severe pain. 40 tablet 0  . pantoprazole (PROTONIX) 40 MG tablet Take 1 tablet (40 mg total) by mouth daily. 30 tablet 2  . Pembrolizumab (KEYTRUDA IV) Inject into the vein every 21 ( twenty-one) days.     No current facility-administered medications for this visit.    ALLERGIES:  No Known Allergies  PHYSICAL EXAM:  Performance status (ECOG): 0 - Asymptomatic  There were no vitals filed for this visit. Wt Readings from Last 3 Encounters:  09/27/20 111 lb 1.8 oz (50.4 kg)  09/24/20 109 lb 1.6 oz (49.5 kg)  09/12/20 111 lb (50.3 kg)   Physical Exam Vitals reviewed.  Constitutional:      Appearance: Normal appearance.  HENT:     Mouth/Throat:     Lips: No lesions.     Mouth: No oral lesions.      Dentition: No gum lesions.     Tongue: No lesions.     Palate: No lesions.     Pharynx: No oropharyngeal exudate or posterior oropharyngeal erythema.     Tonsils: No tonsillar exudate.  Cardiovascular:     Rate and Rhythm: Normal rate and regular rhythm.     Pulses: Normal pulses.     Heart sounds: Normal heart sounds.  Pulmonary:     Effort: Pulmonary effort is normal.     Breath sounds: Normal breath sounds.  Chest:  Breasts:     Right: No axillary adenopathy or supraclavicular adenopathy.     Left: No axillary adenopathy or supraclavicular adenopathy.      Comments: Port-a-Cath in L chest Abdominal:     Palpations: Abdomen is soft. There is no hepatomegaly, splenomegaly or mass.     Tenderness: There is no abdominal tenderness.     Hernia: No hernia is present.  Musculoskeletal:     Right lower leg: No edema.     Left lower leg: No edema.  Lymphadenopathy:     Cervical: No cervical adenopathy.     Upper Body:     Right upper body: No supraclavicular, axillary or pectoral adenopathy.     Left upper body: No supraclavicular, axillary or pectoral adenopathy.     Lower Body: No right inguinal adenopathy. No left inguinal adenopathy.  Neurological:     General: No focal deficit present.     Mental Status: She is alert and oriented to person, place, and time.  Psychiatric:        Mood and Affect: Mood normal.        Behavior: Behavior normal.     LABORATORY DATA:  I have reviewed the labs as listed.  CBC Latest Ref Rng & Units 09/24/2020 08/22/2020 07/30/2020  WBC 4.0 - 10.5 K/uL 5.3 5.3 4.5  Hemoglobin 12.0 - 15.0 g/dL 13.3 12.9 13.1  Hematocrit 36.0 - 46.0 % 39.7 39.0 40.5  Platelets 150 - 400 K/uL 261 221 232   CMP Latest Ref Rng & Units 09/24/2020 08/22/2020 07/30/2020  Glucose 70 - 99 mg/dL 89 92 105(H)  BUN 6 - 20 mg/dL 9 6 9   Creatinine 0.44 - 1.00  mg/dL 0.57 0.66 0.74  Sodium 135 - 145 mmol/L 135 136 138  Potassium 3.5 - 5.1 mmol/L 3.6 3.4(L) 3.9  Chloride 98  - 111 mmol/L 105 102 104  CO2 22 - 32 mmol/L 24 25 26   Calcium 8.9 - 10.3 mg/dL 9.5 9.5 9.6  Total Protein 6.5 - 8.1 g/dL 7.4 7.0 7.0  Total Bilirubin 0.3 - 1.2 mg/dL 0.6 0.8 0.6  Alkaline Phos 38 - 126 U/L 62 62 67  AST 15 - 41 U/L 19 21 21   ALT 0 - 44 U/L 15 17 17     DIAGNOSTIC IMAGING:  I have independently reviewed the scans and discussed with the patient. CT CHEST ABDOMEN PELVIS W CONTRAST  Result Date: 09/25/2020 CLINICAL DATA:  Melanoma diagnosed in December 2020. Immunotherapy ongoing. EXAM: CT CHEST, ABDOMEN, AND PELVIS WITH CONTRAST TECHNIQUE: Multidetector CT imaging of the chest, abdomen and pelvis was performed following the standard protocol during bolus administration of intravenous contrast. CONTRAST:  35m OMNIPAQUE IOHEXOL 300 MG/ML  SOLN COMPARISON:  Prior CTs 06/12/2020. FINDINGS: CT CHEST FINDINGS Cardiovascular: No significant vascular findings. Left subclavian Port-A-Cath extends to the superior cavoatrial junction. The heart size is normal. There is no pericardial effusion. Mediastinum/Nodes: There are no enlarged mediastinal, hilar or axillary lymph nodes. The thyroid gland, trachea and esophagus demonstrate no significant findings. Residual thymic tissue in the anterior mediastinum appears unchanged. Lungs/Pleura: No pleural effusion or pneumothorax. The lungs appear stable with mild emphysematous changes and scattered dependent subpleural densities. No suspicious pulmonary nodularity. Musculoskeletal/Chest wall: No chest wall mass or suspicious osseous findings. CT ABDOMEN AND PELVIS FINDINGS Hepatobiliary: The liver is normal in density without suspicious focal abnormality. No evidence of gallstones, gallbladder wall thickening or biliary dilatation. Pancreas: Unremarkable. No pancreatic ductal dilatation or surrounding inflammatory changes. Spleen: Normal in size without focal abnormality. Adrenals/Urinary Tract: Both adrenal glands appear normal. Stable probable tiny cyst  in the interpolar region of the left kidney. No evidence of renal mass, urinary tract calculus or hydronephrosis. The bladder appears unremarkable. Stomach/Bowel: No evidence of bowel wall thickening, distention or surrounding inflammatory change. Vascular/Lymphatic: There are no enlarged abdominal or pelvic lymph nodes. There are surgical clips in the right inguinal region stable small inguinal lymph nodes bilaterally. Mild aortoiliac atherosclerosis without acute vascular findings. Reproductive: The uterus and ovaries appear normal. No adnexal mass. Other: No ascites or peritoneal nodularity.  Intact abdominal wall. Musculoskeletal: No acute or significant osseous findings. IMPRESSION: 1. Stable CTS of the chest, abdomen and pelvis. No evidence of local recurrence or metastatic disease. 2. Aortic Atherosclerosis (ICD10-I70.0) and Emphysema (ICD10-J43.9). Electronically Signed   By: WRichardean SaleM.D.   On: 09/25/2020 17:07     ASSESSMENT:   1. 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 Pathology did not show any residual melanoma. -CT chest and abdomen on 03/21/2020 did not show any evidence of metastatic disease. -Adjuvant immunotherapy with pembrolizumab 1 year recommended. -Pembrolizumab started on 03/25/2020. -CT CAP on 06/12/2020 did not show any evidence of metastatic disease in the chest, abdomen or pelvis. Mild emphysematous changes and respiratory bronchiolitis. -CT right foot with contrast on 06/13/2020 shows no evidence of osteomyelitis, abscess, septic arthritis or myositis. -MRI of the brain on 08/19/2020 did not show any evidence  of metastatic disease. -- Last imaging with no concern for disease  recurrence.  2. Family history: -Mother died of throat cancer. Maternal grandmother had breast cancer. Maternal aunt had pancreatic cancer. Maternal grandfather had bone cancer. Maternal great grandmother died of melanoma. -She will benefit from genetic testing.  3. Right thyroid nodule: -PET scan on 12/20/2019 showed uptake in the right thyroid. -Ultrasound on 02/05/2020 did not show any suspicious nodules. -FNA on 05/01/2020 showed benign follicular nodule, Bethesda category 2.   PLAN:   1. Stage IIIb (T2B N2A) malignant melanoma of the right third toe, BRAF V6 100 E-: -She is continuing to tolerate immunotherapy without any side effects related to immunotherapy.  2. Microcytic anemia: -Hemoglobin today 13.3.  No Feraheme needed.  3.Hypothyroidism: -Continue Synthroid 50 mcg daily.  TSH today improved to 5.312.   Orders placed this encounter:  No orders of the defined types were placed in this encounter.

## 2020-10-24 ENCOUNTER — Inpatient Hospital Stay (HOSPITAL_COMMUNITY): Payer: Medicaid Other | Admitting: Hematology and Oncology

## 2020-10-24 ENCOUNTER — Inpatient Hospital Stay (HOSPITAL_COMMUNITY): Payer: Medicaid Other

## 2020-10-25 ENCOUNTER — Inpatient Hospital Stay (HOSPITAL_COMMUNITY): Payer: Medicaid Other | Admitting: Hematology and Oncology

## 2020-10-25 ENCOUNTER — Inpatient Hospital Stay (HOSPITAL_COMMUNITY): Payer: Medicaid Other

## 2020-10-25 ENCOUNTER — Inpatient Hospital Stay (HOSPITAL_COMMUNITY): Payer: Medicaid Other | Attending: Hematology

## 2020-10-28 ENCOUNTER — Ambulatory Visit: Payer: Medicaid Other | Admitting: Gastroenterology

## 2020-11-03 ENCOUNTER — Other Ambulatory Visit (HOSPITAL_COMMUNITY): Payer: Self-pay | Admitting: Hematology

## 2020-11-03 DIAGNOSIS — E032 Hypothyroidism due to medicaments and other exogenous substances: Secondary | ICD-10-CM

## 2020-11-14 ENCOUNTER — Inpatient Hospital Stay (HOSPITAL_COMMUNITY): Payer: Medicaid Other | Attending: Hematology

## 2020-11-14 ENCOUNTER — Inpatient Hospital Stay (HOSPITAL_BASED_OUTPATIENT_CLINIC_OR_DEPARTMENT_OTHER): Payer: Medicaid Other | Admitting: Hematology

## 2020-11-14 ENCOUNTER — Inpatient Hospital Stay (HOSPITAL_COMMUNITY): Payer: Medicaid Other

## 2020-11-14 ENCOUNTER — Other Ambulatory Visit: Payer: Self-pay

## 2020-11-14 VITALS — BP 93/56 | HR 66 | Temp 97.2°F | Resp 18 | Wt 110.1 lb

## 2020-11-14 DIAGNOSIS — C439 Malignant melanoma of skin, unspecified: Secondary | ICD-10-CM

## 2020-11-14 DIAGNOSIS — E039 Hypothyroidism, unspecified: Secondary | ICD-10-CM | POA: Diagnosis not present

## 2020-11-14 DIAGNOSIS — C4371 Malignant melanoma of right lower limb, including hip: Secondary | ICD-10-CM | POA: Diagnosis present

## 2020-11-14 DIAGNOSIS — D649 Anemia, unspecified: Secondary | ICD-10-CM | POA: Insufficient documentation

## 2020-11-14 DIAGNOSIS — Z79899 Other long term (current) drug therapy: Secondary | ICD-10-CM | POA: Diagnosis not present

## 2020-11-14 DIAGNOSIS — Z5111 Encounter for antineoplastic chemotherapy: Secondary | ICD-10-CM | POA: Insufficient documentation

## 2020-11-14 LAB — TSH: TSH: 6.184 u[IU]/mL — ABNORMAL HIGH (ref 0.350–4.500)

## 2020-11-14 LAB — CBC WITH DIFFERENTIAL/PLATELET
Abs Immature Granulocytes: 0.02 10*3/uL (ref 0.00–0.07)
Basophils Absolute: 0 10*3/uL (ref 0.0–0.1)
Basophils Relative: 1 %
Eosinophils Absolute: 0.2 10*3/uL (ref 0.0–0.5)
Eosinophils Relative: 5 %
HCT: 37.7 % (ref 36.0–46.0)
Hemoglobin: 12.6 g/dL (ref 12.0–15.0)
Immature Granulocytes: 1 %
Lymphocytes Relative: 29 %
Lymphs Abs: 1.2 10*3/uL (ref 0.7–4.0)
MCH: 31.3 pg (ref 26.0–34.0)
MCHC: 33.4 g/dL (ref 30.0–36.0)
MCV: 93.8 fL (ref 80.0–100.0)
Monocytes Absolute: 0.3 10*3/uL (ref 0.1–1.0)
Monocytes Relative: 7 %
Neutro Abs: 2.4 10*3/uL (ref 1.7–7.7)
Neutrophils Relative %: 57 %
Platelets: 189 10*3/uL (ref 150–400)
RBC: 4.02 MIL/uL (ref 3.87–5.11)
RDW: 12.1 % (ref 11.5–15.5)
WBC: 4.2 10*3/uL (ref 4.0–10.5)
nRBC: 0 % (ref 0.0–0.2)

## 2020-11-14 LAB — COMPREHENSIVE METABOLIC PANEL
ALT: 14 U/L (ref 0–44)
AST: 19 U/L (ref 15–41)
Albumin: 3.7 g/dL (ref 3.5–5.0)
Alkaline Phosphatase: 61 U/L (ref 38–126)
Anion gap: 6 (ref 5–15)
BUN: 7 mg/dL (ref 6–20)
CO2: 23 mmol/L (ref 22–32)
Calcium: 9.3 mg/dL (ref 8.9–10.3)
Chloride: 107 mmol/L (ref 98–111)
Creatinine, Ser: 0.56 mg/dL (ref 0.44–1.00)
GFR, Estimated: >60 mL/min (ref >60)
Glucose, Bld: 95 mg/dL (ref 70–99)
Potassium: 3.6 mmol/L (ref 3.5–5.1)
Sodium: 136 mmol/L (ref 135–145)
Total Bilirubin: 0.4 mg/dL (ref 0.3–1.2)
Total Protein: 6.7 g/dL (ref 6.5–8.1)

## 2020-11-14 MED ORDER — SODIUM CHLORIDE 0.9 % IV SOLN
200.0000 mg | Freq: Once | INTRAVENOUS | Status: AC
  Administered 2020-11-14: 200 mg via INTRAVENOUS
  Filled 2020-11-14: qty 8

## 2020-11-14 MED ORDER — SODIUM CHLORIDE 0.9% FLUSH
10.0000 mL | Freq: Once | INTRAVENOUS | Status: AC
  Administered 2020-11-14: 10 mL via INTRAVENOUS

## 2020-11-14 MED ORDER — SODIUM CHLORIDE 0.9 % IV SOLN: Freq: Once | INTRAVENOUS | Status: AC

## 2020-11-14 MED ORDER — HEPARIN SOD (PORK) LOCK FLUSH 100 UNIT/ML IV SOLN
500.0000 [IU] | Freq: Once | INTRAVENOUS | Status: AC | PRN
  Administered 2020-11-14: 500 [IU]

## 2020-11-14 NOTE — Progress Notes (Signed)
[address], [place] [id number]   CLINIC:  Medical Oncology/Hematology  PCP:  Patient, No Pcp Per None None   REASON FOR VISIT:  Follow-up for malignant melanoma  PRIOR THERAPY: Resection of right third toe on 01/30/2020 with right third toe amputation at PIP joint on 03/07/2020  NGS Results: BRAF V600E and V600K negative  CURRENT THERAPY: Adjuvant Keytruda every 3 weeks  BRIEF ONCOLOGIC HISTORY:  Oncology History  Melanoma of skin ([place])  12/11/2019 Initial Diagnosis   Melanoma of skin ([place])   02/19/2020 Genetic Testing   BRAF Mutation Analysis     03/25/2020 -  Chemotherapy   The patient had pembrolizumab (KEYTRUDA) 200 mg in sodium chloride 0.9 % 50 mL chemo infusion, 200 mg, Intravenous, Once, 9 of 12 cycles Administration: 200 mg (03/25/2020), 200 mg (05/06/2020), 200 mg (05/27/2020), 200 mg (07/09/2020), 200 mg (04/15/2020), 200 mg (06/17/2020), 200 mg (07/30/2020), 200 mg (08/22/2020), 200 mg (09/27/2020)  for chemotherapy treatment.      CANCER STAGING: Cancer Staging Melanoma of skin ([place]) Staging form: Melanoma of the Skin, AJCC 8th Edition - Clinical stage from 02/08/2020: Stage III (cT2b, cN2a, cM0) - Unsigned   INTERVAL HISTORY:  [name], a 45 y.o. female, returns for routine follow-up and consideration for next cycle of immunotherapy. Donna Munoz was last seen on 09/24/2020.  Due for cycle #10 of Keytruda today.   Overall, she tells me she has been feeling pretty well. She has been tolerating the treatment well and reports having a dry scalp with flaking, but denies having dry skin, rashes or V/D. She takes Zofran for nausea PRN. Her appetite is not good, most likely due to stress at home, but she is maintaining her weight and denies abdominal pain. She reports having pain around her xiphoid when she takes a deep breath and denies association with food. She continues taking Synthroid 50 mcg.  Overall, she feels ready for next cycle  of immunotherapy today.    REVIEW OF SYSTEMS:  Review of Systems  Constitutional: Positive for appetite change (25%) and fatigue (25%).  Cardiovascular: Positive for chest pain (around xiphoid w/ deep breaths).  Gastrointestinal: Positive for nausea. Negative for abdominal pain, diarrhea and vomiting.  Skin: Negative for rash.  All other systems reviewed and are negative.   PAST MEDICAL/SURGICAL HISTORY:  Past Medical History:  Diagnosis Date  . Anemia   . Anxiety   . Depression   . GERD (gastroesophageal reflux disease)   . Headache   . Shingles   . Tobacco abuse 05/06/2020   Past Surgical History:  Procedure Laterality Date  . AMPUTATION TOE Right 03/07/2020   Procedure: RIGHT THIRD TOE AMPUTATION;  Surgeon: [name], MD;  Location: [facility];  Service: General;  Laterality: Right;  . BREAST LUMPECTOMY  age 86  . CESAREAN SECTION  01/06/2006  . CESAREAN SECTION  10/15/2016  . DENTAL SURGERY  2019  . INGUINAL HERNIA REPAIR Right 01/30/2020   Procedure: RIGHT INGUINAL HERNIA REPAIR WITH  MESH;  Surgeon: [name], MD;  Location: [facility];  Service: General;  Laterality: Right;  . INSERTION OF MESH Right 01/30/2020   Procedure: INSERTION OF MESH;  Surgeon: [name], MD;  Location: [facility];  Service: General;  Laterality: Right;  . IR CV LINE INJECTION  09/11/2020  . IR IMAGING GUIDED PORT INSERTION  09/12/2020  . IR REMOVAL TUN ACCESS W/ PORT W/O FL MOD SED  09/12/2020  .  IRRIGATION AND DEBRIDEMENT ABSCESS Right 03/07/2020   Procedure: Aspiration of Right Groin Seroma;  Surgeon: [name], MD;  Location: [facility];  Service: General;  Laterality: Right;  . MELANOMA EXCISION WITH SENTINEL LYMPH NODE BIOPSY Right 01/30/2020   Procedure: WIDE LOWER EXCISION RIGHT THIRD TOE MELANOMA EXCISION WITH SENTINEL LYMPH NODE BIOPSY;  Surgeon: [name], MD;  Location: [facility];  Service: General;  Laterality: Right;  . PORTACATH  PLACEMENT N/A 03/07/2020   Procedure: INSERTION PORT-A-CATH;  Surgeon: [name], MD;  Location: [facility];  Service: General;  Laterality: Left    SOCIAL HISTORY:  Social History   Socioeconomic History  . Marital status: Legally Separated    Spouse name: Not on file  . Number of children: 2  . Years of education: Not on file  . Highest education level: Not on file  Occupational History  . Occupation: unemployed d/t COVID  Tobacco Use  . Smoking status: Current Every Day Smoker    Packs/day: 1.00  . Smokeless tobacco: Never Used  Vaping Use  . Vaping Use: Never used  Substance and Sexual Activity  . Alcohol use: No  . Drug use: Yes    Types: Marijuana  . Sexual activity: Yes  Other Topics Concern  . Not on file  Social History Narrative  . Not on file   Social Determinants of Health   Financial Resource Strain: High Risk  . Difficulty of Paying Living Expenses: Hard  Food Insecurity: No Food Insecurity  . Worried About Charity fundraiser in the Last Year: Never true  . Ran Out of Food in the Last Year: Never true  Transportation Needs: No Transportation Needs  . Lack of Transportation (Medical): No  . Lack of Transportation (Non-Medical): No  Physical Activity: Inactive  . Days of Exercise per Week: 0 days  . Minutes of Exercise per Session: 0 min  Stress: Stress Concern Present  . Feeling of Stress : Very much  Social Connections: Socially Isolated  . Frequency of Communication with Friends and Family: Twice a week  . Frequency of Social Gatherings with Friends and Family: Never  . Attends Religious Services: 1 to 4 times per year  . Active Member of Clubs or Organizations: No  . Attends Archivist Meetings: Never  . Marital Status: Separated  Intimate Partner Violence: Not At Risk  . Fear of Current or Ex-Partner: No  . Emotionally Abused: No  . Physically Abused: No  . Sexually Abused: No    FAMILY HISTORY:  Family History  Problem Relation  Age of Onset  . Heart disease Mother   . Cancer Mother   . Ovarian cysts Sister   . Healthy Brother   . Cancer Maternal Grandmother   . Heart attack Maternal Grandmother   . Cancer Maternal Grandfather   . Cancer Paternal Grandmother   . Healthy Sister   . Healthy Sister   . Psoriasis Daughter   . Healthy Son     CURRENT MEDICATIONS:  Current Outpatient Medications  Medication Sig Dispense Refill  . gabapentin (NEURONTIN) 100 MG capsule Take by mouth daily.     Marland Kitchen levothyroxine (SYNTHROID) 50 MCG tablet TAKE 1 TABLET BY MOUTH DAILY BEFORE BREAKFAST 30 tablet 1  . pantoprazole (PROTONIX) 40 MG tablet Take 1 tablet (40 mg total) by mouth daily. 30 tablet 2  . Pembrolizumab (KEYTRUDA IV) Inject into the vein every 21 ( twenty-one) days.    Marland Kitchen lidocaine-prilocaine (EMLA) cream Apply a  small amount to port a cath site (do not rub in) and cover with plastic wrap 1 hour prior to chemotherapy appointments (Patient not taking: Reported on 11/14/2020) 30 g 3  . ondansetron (ZOFRAN ODT) 4 MG disintegrating tablet Take 1 tablet (4 mg total) by mouth every 8 (eight) hours as needed for nausea or vomiting. (Patient not taking: Reported on 11/14/2020) 30 tablet 0  . oxyCODONE (OXY IR/ROXICODONE) 5 MG immediate release tablet Take 1 tablet (5 mg total) by mouth every 6 (six) hours as needed for severe pain. (Patient not taking: Reported on 11/14/2020) 40 tablet 0   No current facility-administered medications for this visit.    ALLERGIES:  No Known Allergies  PHYSICAL EXAM:  Performance status (ECOG): 0 - Asymptomatic  Vitals:   11/14/20 0855  BP: (!) 93/56  Pulse: 66  Resp: 18  Temp: (!) 97.2 F (36.2 C)  SpO2: 100%   Wt Readings from Last 3 Encounters:  11/14/20 110 lb 1.6 oz (49.9 kg)  09/27/20 111 lb 1.8 oz (50.4 kg)  09/24/20 109 lb 1.6 oz (49.5 kg)   Physical Exam Vitals reviewed.  Constitutional:      Appearance: Normal appearance.  Cardiovascular:     Rate and Rhythm: Normal  rate and regular rhythm.     Pulses: Normal pulses.     Heart sounds: Normal heart sounds.  Pulmonary:     Effort: Pulmonary effort is normal.     Breath sounds: Normal breath sounds.  Chest:     Chest wall: Tenderness (around xiphoid process) present.  Abdominal:     Palpations: Abdomen is soft. There is no hepatomegaly, splenomegaly or mass.     Tenderness: There is no abdominal tenderness.     Hernia: No hernia is present.  Neurological:     General: No focal deficit present.     Mental Status: She is alert and oriented to person, place, and time.  Psychiatric:        Mood and Affect: Mood normal.        Behavior: Behavior normal.     LABORATORY DATA:  I have reviewed the labs as listed.  CBC Latest Ref Rng & Units 11/14/2020 09/24/2020 08/22/2020  WBC 4.0 - 10.5 K/uL 4.2 5.3 5.3  Hemoglobin 12.0 - 15.0 g/dL 12.6 13.3 12.9  Hematocrit 36.0 - 46.0 % 37.7 39.7 39.0  Platelets 150 - 400 K/uL 189 261 221   CMP Latest Ref Rng & Units 09/24/2020 08/22/2020 07/30/2020  Glucose 70 - 99 mg/dL 89 92 105(H)  BUN 6 - 20 mg/dL 9 6 9   Creatinine 0.44 - 1.00 mg/dL 0.57 0.66 0.74  Sodium 135 - 145 mmol/L 135 136 138  Potassium 3.5 - 5.1 mmol/L 3.6 3.4(L) 3.9  Chloride 98 - 111 mmol/L 105 102 104  CO2 22 - 32 mmol/L 24 25 26   Calcium 8.9 - 10.3 mg/dL 9.5 9.5 9.6  Total Protein 6.5 - 8.1 g/dL 7.4 7.0 7.0  Total Bilirubin 0.3 - 1.2 mg/dL 0.6 0.8 0.6  Alkaline Phos 38 - 126 U/L 62 62 67  AST 15 - 41 U/L 19 21 21   ALT 0 - 44 U/L 15 17 17     DIAGNOSTIC IMAGING:  I have independently reviewed the scans and discussed with the patient. No results found.   ASSESSMENT:  1. 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 -She underwent right third toe amputation at PIP level on 03/07/2020 for positive  margins. Pathology did not show any residual melanoma. -CT chest and abdomen on 03/21/2020 did not show any evidence of metastatic disease. -Adjuvant immunotherapy with pembrolizumab 1 year recommended. -Pembrolizumab started on 03/25/2020. -CT CAP on 06/12/2020 did not show any evidence of metastatic disease in the chest, abdomen or pelvis. Mild emphysematous changes and respiratory bronchiolitis. -CT right foot with contrast on 06/13/2020 shows no evidence of osteomyelitis, abscess, septic arthritis or myositis. -MRI of the brain on 08/19/2020 did not show any evidence of metastatic disease. -CT CAP on 09/25/2020 did not show any evidence of metastatic disease.  2. Family history: -Mother died of throat cancer. Maternal grandmother had breast cancer. Maternal aunt had pancreatic cancer. Maternal grandfather had bone cancer. Maternal great grandmother died of melanoma. -She will benefit from genetic testing.  3. Right thyroid nodule: -PET scan on 12/20/2019 showed uptake in the right thyroid. -Ultrasound on 02/05/2020 did not show any suspicious nodules. -FNA on 05/01/2020 showed benign follicular nodule, Bethesda category 2.   PLAN:  1. 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  No immunotherapy related side effects. -She missed her treatment in December as she had a court date. -Reviewed results of CT CAP from 09/25/2020 which did not show any metastatic disease. -Reviewed her labs which showed normal LFTs.  We will follow up on TSH. -She will proceed with Keytruda today and in 3 weeks. -I plan to see her back in 6 weeks with repeat CT CAP and other labs.  2. Microcytic anemia: -Hemoglobin is 12.6 today.  No Feraheme needed.  3.Hypothyroidism: -Continue Synthroid 50 mcg daily.   Orders placed this encounter:  No orders of the defined types were placed in this encounter.    Derek Jack, MD [place] [phone]   I, Milinda Antis, am acting as a scribe for Dr. Sanda Linger.  I, Derek Jack MD, have reviewed the above documentation for accuracy and completeness, and I agree with the above.

## 2020-11-14 NOTE — Progress Notes (Signed)
Patient has been examined, vital signs and labs have been reviewed by Dr. Ellin Saba. ANC, Creatinine, LFTs, hemoglobin, and platelets are within treatment parameters per Dr. Ellin Saba. Patient is okay to proceed with treatment per M.D.   Tolerated Keytruda infusion w/o adverse reaction.  Alert, in no distress.  Discharged ambulatory in stable condition.

## 2020-11-14 NOTE — Progress Notes (Signed)
Patients port flushed without difficulty.  Good blood return noted with no bruising or swelling noted at site.  Transparent dressing applied.  Patient remains accessed for chemotherapy treatment.  

## 2020-11-14 NOTE — Progress Notes (Signed)
Patient assessed and labs reviewed by Dr. Katragadda. Okay to proceed with treatment. Primary RN and pharmacy aware. 

## 2020-11-14 NOTE — Patient Instructions (Signed)
[facility] at [facility] Discharge Instructions  You were seen today by Dr. Ellin Saba. He went over your recent results and scans. You received your treatment today; continue getting your treatment every 3 weeks. You will be scheduled for a CT scan of your chest and abdomen before your next visit. Dr. Ellin Saba will see you back in 6 weeks for labs and follow up.   Thank you for choosing [facility] at [facility] to provide your oncology and hematology care.  To afford each patient quality time with our provider, please arrive at least 15 minutes before your scheduled appointment time.   If you have a lab appointment with the Cancer Center please come in thru the Main Entrance and check in at the main information desk  You need to re-schedule your appointment should you arrive 10 or more minutes late.  We strive to give you quality time with our providers, and arriving late affects you and other patients whose appointments are after yours.  Also, if you no show three or more times for appointments you may be dismissed from the clinic at the providers discretion.     Again, thank you for choosing [facility].  Our hope is that these requests will decrease the amount of time that you wait before being seen by our physicians.       _____________________________________________________________  Should you have questions after your visit to [facility], please contact our office at [phone] between the hours of 8:00 a.m. and 4:30 p.m.  Voicemails left after 4:00 p.m. will not be returned until the following business day.  For prescription refill requests, have your pharmacy contact our office and allow 72 hours.    Cancer Center Support Programs:   > Cancer Support Group  2nd Tuesday of the month 1pm-2pm, Journey Room

## 2020-12-04 ENCOUNTER — Encounter: Payer: Self-pay | Admitting: Gastroenterology

## 2020-12-04 ENCOUNTER — Ambulatory Visit: Payer: Medicaid Other | Admitting: Gastroenterology

## 2020-12-05 ENCOUNTER — Encounter (HOSPITAL_COMMUNITY): Payer: Self-pay

## 2020-12-05 ENCOUNTER — Other Ambulatory Visit: Payer: Self-pay

## 2020-12-05 ENCOUNTER — Inpatient Hospital Stay (HOSPITAL_COMMUNITY): Payer: Medicaid Other

## 2020-12-05 VITALS — BP 96/58 | HR 70 | Temp 97.4°F | Resp 18

## 2020-12-05 DIAGNOSIS — C439 Malignant melanoma of skin, unspecified: Secondary | ICD-10-CM

## 2020-12-05 DIAGNOSIS — Z5111 Encounter for antineoplastic chemotherapy: Secondary | ICD-10-CM | POA: Diagnosis not present

## 2020-12-05 LAB — COMPREHENSIVE METABOLIC PANEL
ALT: 17 U/L (ref 0–44)
AST: 24 U/L (ref 15–41)
Albumin: 3.6 g/dL (ref 3.5–5.0)
Alkaline Phosphatase: 66 U/L (ref 38–126)
Anion gap: 5 (ref 5–15)
BUN: 9 mg/dL (ref 6–20)
CO2: 24 mmol/L (ref 22–32)
Calcium: 9.3 mg/dL (ref 8.9–10.3)
Chloride: 105 mmol/L (ref 98–111)
Creatinine, Ser: 0.56 mg/dL (ref 0.44–1.00)
GFR, Estimated: >60 mL/min (ref >60)
Glucose, Bld: 91 mg/dL (ref 70–99)
Potassium: 3.6 mmol/L (ref 3.5–5.1)
Sodium: 134 mmol/L — ABNORMAL LOW (ref 135–145)
Total Bilirubin: 0.4 mg/dL (ref 0.3–1.2)
Total Protein: 6.5 g/dL (ref 6.5–8.1)

## 2020-12-05 LAB — CBC WITH DIFFERENTIAL/PLATELET
Abs Immature Granulocytes: 0.01 10*3/uL (ref 0.00–0.07)
Basophils Absolute: 0 10*3/uL (ref 0.0–0.1)
Basophils Relative: 1 %
Eosinophils Absolute: 0.3 10*3/uL (ref 0.0–0.5)
Eosinophils Relative: 5 %
HCT: 35.4 % — ABNORMAL LOW (ref 36.0–46.0)
Hemoglobin: 11.4 g/dL — ABNORMAL LOW (ref 12.0–15.0)
Immature Granulocytes: 0 %
Lymphocytes Relative: 31 %
Lymphs Abs: 1.6 10*3/uL (ref 0.7–4.0)
MCH: 30.6 pg (ref 26.0–34.0)
MCHC: 32.2 g/dL (ref 30.0–36.0)
MCV: 94.9 fL (ref 80.0–100.0)
Monocytes Absolute: 0.3 10*3/uL (ref 0.1–1.0)
Monocytes Relative: 6 %
Neutro Abs: 3 10*3/uL (ref 1.7–7.7)
Neutrophils Relative %: 57 %
Platelets: 233 10*3/uL (ref 150–400)
RBC: 3.73 MIL/uL — ABNORMAL LOW (ref 3.87–5.11)
RDW: 13.1 % (ref 11.5–15.5)
WBC: 5.3 10*3/uL (ref 4.0–10.5)
nRBC: 0 % (ref 0.0–0.2)

## 2020-12-05 MED ORDER — HEPARIN SOD (PORK) LOCK FLUSH 100 UNIT/ML IV SOLN
500.0000 [IU] | Freq: Once | INTRAVENOUS | Status: AC | PRN
  Administered 2020-12-05: 500 [IU]

## 2020-12-05 MED ORDER — SODIUM CHLORIDE 0.9 % IV SOLN: Freq: Once | INTRAVENOUS | Status: AC

## 2020-12-05 MED ORDER — SODIUM CHLORIDE 0.9 % IV SOLN
200.0000 mg | Freq: Once | INTRAVENOUS | Status: AC
  Administered 2020-12-05: 200 mg via INTRAVENOUS
  Filled 2020-12-05: qty 8

## 2020-12-05 NOTE — Patient Instructions (Signed)
[facility] at [facility] Discharge Instructions  Labs drawn from portacath today   Thank you for choosing [facility] at [facility] to provide your oncology and hematology care.  To afford each patient quality time with our provider, please arrive at least 15 minutes before your scheduled appointment time.   If you have a lab appointment with the Cancer Center please come in thru the Main Entrance and check in at the main information desk.  You need to re-schedule your appointment should you arrive 10 or more minutes late.  We strive to give you quality time with our providers, and arriving late affects you and other patients whose appointments are after yours.  Also, if you no show three or more times for appointments you may be dismissed from the clinic at the providers discretion.     Again, thank you for choosing [facility].  Our hope is that these requests will decrease the amount of time that you wait before being seen by our physicians.       _____________________________________________________________  Should you have questions after your visit to [facility], please contact our office at [phone] and follow the prompts.  Our office hours are 8:00 a.m. and 4:30 p.m. Monday - Friday.  Please note that voicemails left after 4:00 p.m. may not be returned until the following business day.  We are closed weekends and major holidays.  You do have access to a nurse 24-7, just call the main number to the clinic [phone] and do not press any options, hold on the line and a nurse will answer the phone.    For prescription refill requests, have your pharmacy contact our office and allow 72 hours.    Due to Covid, you will need to wear a mask upon entering the hospital. If you do not have a mask, a mask will be given to you at the Main Entrance upon arrival. For doctor visits, patients may have 1 support person age 18  or older with them. For treatment visits, patients can not have anyone with them due to social distancing guidelines and our immunocompromised population.     

## 2020-12-05 NOTE — Progress Notes (Signed)
Infusion tolerated without incident or complaint. VSS upon completion of treatment. Port flushed and deaccessed per protocol, see MAR and IV flowsheet for details. Discharged ambulatory in stable condition.

## 2020-12-25 DIAGNOSIS — C4371 Malignant melanoma of right lower limb, including hip: Secondary | ICD-10-CM | POA: Diagnosis not present

## 2020-12-26 ENCOUNTER — Other Ambulatory Visit: Payer: Self-pay

## 2020-12-26 ENCOUNTER — Inpatient Hospital Stay (HOSPITAL_BASED_OUTPATIENT_CLINIC_OR_DEPARTMENT_OTHER): Payer: Medicaid Other | Admitting: Hematology

## 2020-12-26 ENCOUNTER — Inpatient Hospital Stay (HOSPITAL_COMMUNITY): Payer: Medicaid Other

## 2020-12-26 ENCOUNTER — Ambulatory Visit (HOSPITAL_COMMUNITY)
Admission: RE | Admit: 2020-12-26 | Discharge: 2020-12-26 | Disposition: A | Discharge status: Home / Self Care | Payer: Medicaid Other | Source: Ambulatory Visit | Attending: Hematology | Admitting: Hematology

## 2020-12-26 ENCOUNTER — Inpatient Hospital Stay (HOSPITAL_COMMUNITY): Payer: Medicaid Other | Attending: Hematology

## 2020-12-26 ENCOUNTER — Other Ambulatory Visit (HOSPITAL_COMMUNITY): Payer: Self-pay | Admitting: Hematology

## 2020-12-26 DIAGNOSIS — Z95828 Presence of other vascular implants and grafts: Secondary | ICD-10-CM

## 2020-12-26 DIAGNOSIS — C4371 Malignant melanoma of right lower limb, including hip: Secondary | ICD-10-CM | POA: Insufficient documentation

## 2020-12-26 DIAGNOSIS — Z5112 Encounter for antineoplastic immunotherapy: Secondary | ICD-10-CM | POA: Insufficient documentation

## 2020-12-26 DIAGNOSIS — C439 Malignant melanoma of skin, unspecified: Secondary | ICD-10-CM | POA: Diagnosis not present

## 2020-12-26 DIAGNOSIS — Z809 Family history of malignant neoplasm, unspecified: Secondary | ICD-10-CM | POA: Diagnosis not present

## 2020-12-26 DIAGNOSIS — Z800 Family history of malignant neoplasm of digestive organs: Secondary | ICD-10-CM | POA: Diagnosis not present

## 2020-12-26 DIAGNOSIS — Z8249 Family history of ischemic heart disease and other diseases of the circulatory system: Secondary | ICD-10-CM | POA: Diagnosis not present

## 2020-12-26 DIAGNOSIS — F17210 Nicotine dependence, cigarettes, uncomplicated: Secondary | ICD-10-CM | POA: Insufficient documentation

## 2020-12-26 DIAGNOSIS — E039 Hypothyroidism, unspecified: Secondary | ICD-10-CM | POA: Diagnosis not present

## 2020-12-26 DIAGNOSIS — Z803 Family history of malignant neoplasm of breast: Secondary | ICD-10-CM | POA: Diagnosis not present

## 2020-12-26 DIAGNOSIS — Z801 Family history of malignant neoplasm of trachea, bronchus and lung: Secondary | ICD-10-CM | POA: Diagnosis not present

## 2020-12-26 DIAGNOSIS — D649 Anemia, unspecified: Secondary | ICD-10-CM | POA: Diagnosis not present

## 2020-12-26 DIAGNOSIS — E041 Nontoxic single thyroid nodule: Secondary | ICD-10-CM | POA: Insufficient documentation

## 2020-12-26 DIAGNOSIS — T82828A Fibrosis of vascular prosthetic devices, implants and grafts, initial encounter: Secondary | ICD-10-CM | POA: Diagnosis not present

## 2020-12-26 DIAGNOSIS — F418 Other specified anxiety disorders: Secondary | ICD-10-CM | POA: Diagnosis not present

## 2020-12-26 DIAGNOSIS — Z79899 Other long term (current) drug therapy: Secondary | ICD-10-CM | POA: Diagnosis not present

## 2020-12-26 LAB — CBC WITH DIFFERENTIAL/PLATELET
Abs Immature Granulocytes: 0.01 10*3/uL (ref 0.00–0.07)
Basophils Absolute: 0 10*3/uL (ref 0.0–0.1)
Basophils Relative: 1 %
Eosinophils Absolute: 0.2 10*3/uL (ref 0.0–0.5)
Eosinophils Relative: 4 %
HCT: 37.9 % (ref 36.0–46.0)
Hemoglobin: 12 g/dL (ref 12.0–15.0)
Immature Granulocytes: 0 %
Lymphocytes Relative: 28 %
Lymphs Abs: 1.5 10*3/uL (ref 0.7–4.0)
MCH: 29.5 pg (ref 26.0–34.0)
MCHC: 31.7 g/dL (ref 30.0–36.0)
MCV: 93.1 fL (ref 80.0–100.0)
Monocytes Absolute: 0.4 10*3/uL (ref 0.1–1.0)
Monocytes Relative: 8 %
Neutro Abs: 3.1 10*3/uL (ref 1.7–7.7)
Neutrophils Relative %: 59 %
Platelets: 256 10*3/uL (ref 150–400)
RBC: 4.07 MIL/uL (ref 3.87–5.11)
RDW: 13.5 % (ref 11.5–15.5)
WBC: 5.2 10*3/uL (ref 4.0–10.5)
nRBC: 0 % (ref 0.0–0.2)

## 2020-12-26 LAB — COMPREHENSIVE METABOLIC PANEL
ALT: 15 U/L (ref 0–44)
AST: 21 U/L (ref 15–41)
Albumin: 3.9 g/dL (ref 3.5–5.0)
Alkaline Phosphatase: 63 U/L (ref 38–126)
Anion gap: 6 (ref 5–15)
BUN: 8 mg/dL (ref 6–20)
CO2: 23 mmol/L (ref 22–32)
Calcium: 9.5 mg/dL (ref 8.9–10.3)
Chloride: 105 mmol/L (ref 98–111)
Creatinine, Ser: 0.64 mg/dL (ref 0.44–1.00)
GFR, Estimated: >60 mL/min (ref >60)
Glucose, Bld: 88 mg/dL (ref 70–99)
Potassium: 3.7 mmol/L (ref 3.5–5.1)
Sodium: 134 mmol/L — ABNORMAL LOW (ref 135–145)
Total Bilirubin: 0.8 mg/dL (ref 0.3–1.2)
Total Protein: 6.9 g/dL (ref 6.5–8.1)

## 2020-12-26 LAB — TSH: TSH: 3.696 u[IU]/mL (ref 0.350–4.500)

## 2020-12-26 MED ORDER — SODIUM CHLORIDE FLUSH 0.9 % IV SOLN
INTRAVENOUS | Status: AC
  Administered 2020-12-26: 5 mL
  Filled 2020-12-26: qty 20

## 2020-12-26 MED ORDER — SODIUM CHLORIDE 0.9% FLUSH
10.0000 mL | Freq: Once | INTRAVENOUS | Status: AC
  Administered 2020-12-26: 10 mL

## 2020-12-26 MED ORDER — SODIUM CHLORIDE 0.9 % IV SOLN: Freq: Once | INTRAVENOUS | Status: DC

## 2020-12-26 MED ORDER — HEPARIN SOD (PORK) LOCK FLUSH 100 UNIT/ML IV SOLN
500.0000 [IU] | Freq: Once | INTRAVENOUS | Status: AC
  Administered 2020-12-26: 500 [IU] via INTRAVENOUS

## 2020-12-26 MED ORDER — IOHEXOL 300 MG/ML  SOLN
30.0000 mL | Freq: Once | INTRAMUSCULAR | Status: AC | PRN
  Administered 2020-12-26: 4 mL via INTRAVENOUS

## 2020-12-26 NOTE — Progress Notes (Signed)
Donna Munoz, [place] [id number]   CLINIC:  Medical Oncology/Hematology  PCP:  Patient, No Pcp Per None None   REASON FOR VISIT:  Follow-up for malignant melanoma  PRIOR THERAPY: Resection of right third toe on 01/30/2020 with right third toe amputation at PIP joint on 03/07/2020  NGS Results: BRAF V 600 E and V 600 K negative  CURRENT THERAPY: Adjuvant Keytruda every 3 weeks  BRIEF ONCOLOGIC HISTORY:  Oncology History  Melanoma of skin ([place])  12/11/2019 Initial Diagnosis   Melanoma of skin ([place])   02/19/2020 Genetic Testing   BRAF Mutation Analysis     03/25/2020 -  Chemotherapy    Patient is on Treatment Plan: MELANOMA PEMBROLIZUMAB Q21D        CANCER STAGING: Cancer Staging Melanoma of skin ([place]) Staging form: Melanoma of the Skin, AJCC 8th Edition - Clinical stage from 02/08/2020: Stage III (cT2b, cN2a, cM0) - Unsigned   INTERVAL HISTORY:  Donna Munoz, a 45 y.o. female, returns for routine follow-up and consideration for next cycle of immunotherapy. Donna Munoz was last seen on 11/14/2020.  Due for cycle #12 of Keytruda today.   Overall, she tells me she has been feeling fair. She reports having 1 episode of watery diarrhea on 02/08 which resolved on its own. She takes Zofran ODT for her nausea which helps. She continues taking Synthroid daily. She complains of spontaneously developing soreness and pain to touch directly below her port and her port was not returning blood. She reports that her appetite has decreased again due to family stress and lost the weight she gained. She denies having any recent infections or F/C.  She saw Dr. Barry Dienes on 02/16 and informed her about the pain around her port.  Overall, she feels ready for next cycle of immunotherapy today.    REVIEW OF SYSTEMS:  Review of Systems  Constitutional: Positive for appetite change (50%) and fatigue (depleted). Negative for chills and fever.  Cardiovascular:  Positive for chest pain (4/10 pain around port).  Gastrointestinal: Positive for diarrhea (x1 episode) and nausea (on Zofran).  All other systems reviewed and are negative.   PAST MEDICAL/SURGICAL HISTORY:  Past Medical History:  Diagnosis Date  . Anemia   . Anxiety   . Depression   . GERD (gastroesophageal reflux disease)   . Headache   . Shingles   . Tobacco abuse 05/06/2020   Past Surgical History:  Procedure Laterality Date  . AMPUTATION TOE Right 03/07/2020   Procedure: RIGHT THIRD TOE AMPUTATION;  Surgeon: [name], MD;  Location: [facility];  Service: General;  Laterality: Right;  . BREAST LUMPECTOMY  age 42  . CESAREAN SECTION  01/06/2006  . CESAREAN SECTION  10/15/2016  . DENTAL SURGERY  2019  . INGUINAL HERNIA REPAIR Right 01/30/2020   Procedure: RIGHT INGUINAL HERNIA REPAIR WITH  MESH;  Surgeon: [name], MD;  Location: [facility];  Service: General;  Laterality: Right;  . INSERTION OF MESH Right 01/30/2020   Procedure: INSERTION OF MESH;  Surgeon: [name], MD;  Location: [facility];  Service: General;  Laterality: Right;  . IR CV LINE INJECTION  09/11/2020  . IR IMAGING GUIDED PORT INSERTION  09/12/2020  . IR REMOVAL TUN ACCESS W/ PORT W/O FL MOD SED  09/12/2020  . IRRIGATION AND DEBRIDEMENT ABSCESS Right 03/07/2020   Procedure: Aspiration of Right Groin Seroma;  Surgeon: [name], MD;  Location: [facility];  Service: General;  Laterality: Right;  . MELANOMA EXCISION WITH SENTINEL LYMPH NODE BIOPSY Right 01/30/2020   Procedure: WIDE LOWER EXCISION RIGHT THIRD TOE MELANOMA EXCISION WITH SENTINEL LYMPH NODE BIOPSY;  Surgeon: [name], MD;  Location: [facility];  Service: General;  Laterality: Right;  . PORTACATH PLACEMENT N/A 03/07/2020   Procedure: INSERTION PORT-A-CATH;  Surgeon: [name], MD;  Location: [facility];  Service: General;  Laterality: Left    SOCIAL HISTORY:  Social History   Socioeconomic History   . Marital status: Legally Separated    Spouse name: Not on file  . Number of children: 2  . Years of education: Not on file  . Highest education level: Not on file  Occupational History  . Occupation: unemployed d/t COVID  Tobacco Use  . Smoking status: Current Every Day Smoker    Packs/day: 1.00  . Smokeless tobacco: Never Used  Vaping Use  . Vaping Use: Never used  Substance and Sexual Activity  . Alcohol use: No  . Drug use: Yes    Types: Marijuana  . Sexual activity: Yes  Other Topics Concern  . Not on file  Social History Narrative  . Not on file   Social Determinants of Health   Financial Resource Strain: Not on file  Food Insecurity: Not on file  Transportation Needs: No Transportation Needs  . Lack of Transportation (Medical): No  . Lack of Transportation (Non-Medical): No  Physical Activity: Inactive  . Days of Exercise per Week: 0 days  . Minutes of Exercise per Session: 0 min  Stress: Not on file  Social Connections: Not on file  Intimate Partner Violence: Not At Risk  . Fear of Current or Ex-Partner: No  . Emotionally Abused: No  . Physically Abused: No  . Sexually Abused: No    FAMILY HISTORY:  Family History  Problem Relation Age of Onset  . Heart disease Mother   . Cancer Mother   . Ovarian cysts Sister   . Healthy Brother   . Cancer Maternal Grandmother   . Heart attack Maternal Grandmother   . Cancer Maternal Grandfather   . Cancer Paternal Grandmother   . Healthy Sister   . Healthy Sister   . Psoriasis Daughter   . Healthy Son     CURRENT MEDICATIONS:  Current Outpatient Medications  Medication Sig Dispense Refill  . gabapentin (NEURONTIN) 100 MG capsule Take by mouth daily.     Marland Kitchen levothyroxine (SYNTHROID) 50 MCG tablet TAKE 1 TABLET BY MOUTH DAILY BEFORE BREAKFAST 30 tablet 1  . lidocaine-prilocaine (EMLA) cream Apply a small amount to port a cath site (do not rub in) and cover with plastic wrap 1 hour prior to chemotherapy  appointments 30 g 3  . ondansetron (ZOFRAN ODT) 4 MG disintegrating tablet Take 1 tablet (4 mg total) by mouth every 8 (eight) hours as needed for nausea or vomiting. 30 tablet 0  . oxyCODONE (OXY IR/ROXICODONE) 5 MG immediate release tablet Take 1 tablet (5 mg total) by mouth every 6 (six) hours as needed for severe pain. 40 tablet 0  . pantoprazole (PROTONIX) 40 MG tablet Take 1 tablet (40 mg total) by mouth daily. 30 tablet 2  . Pembrolizumab (KEYTRUDA IV) Inject into the vein every 21 ( twenty-one) days.     No current facility-administered medications for this visit.    ALLERGIES:  No Known Allergies  PHYSICAL EXAM:  Performance status (ECOG): 0 - Asymptomatic  There were no vitals filed for this visit. Wt Readings  from Last 3 Encounters:  12/26/20 110 lb 0.2 oz (49.9 kg)  11/14/20 110 lb 1.6 oz (49.9 kg)  09/27/20 111 lb 1.8 oz (50.4 kg)   Physical Exam Vitals reviewed.  Constitutional:      Appearance: Normal appearance.  Cardiovascular:     Rate and Rhythm: Normal rate and regular rhythm.     Pulses: Normal pulses.     Heart sounds: Normal heart sounds.  Pulmonary:     Effort: Pulmonary effort is normal.     Breath sounds: Normal breath sounds.  Chest:     Chest wall: Tenderness (TTP below port) present. No swelling.  Neurological:     General: No focal deficit present.     Mental Status: She is alert and oriented to person, place, and time.  Psychiatric:        Mood and Affect: Mood normal.        Behavior: Behavior normal.     LABORATORY DATA:  I have reviewed the labs as listed.  CBC Latest Ref Rng & Units 12/26/2020 12/05/2020 11/14/2020  WBC 4.0 - 10.5 K/uL 5.2 5.3 4.2  Hemoglobin 12.0 - 15.0 g/dL 12.0 11.4(L) 12.6  Hematocrit 36.0 - 46.0 % 37.9 35.4(L) 37.7  Platelets 150 - 400 K/uL 256 233 189   CMP Latest Ref Rng & Units 12/26/2020 12/05/2020 11/14/2020  Glucose 70 - 99 mg/dL 88 91 95  BUN 6 - 20 mg/dL 8 9 7   Creatinine 0.44 - 1.00 mg/dL 0.64 0.56 0.56   Sodium 135 - 145 mmol/L 134(L) 134(L) 136  Potassium 3.5 - 5.1 mmol/L 3.7 3.6 3.6  Chloride 98 - 111 mmol/L 105 105 107  CO2 22 - 32 mmol/L 23 24 23   Calcium 8.9 - 10.3 mg/dL 9.5 9.3 9.3  Total Protein 6.5 - 8.1 g/dL 6.9 6.5 6.7  Total Bilirubin 0.3 - 1.2 mg/dL 0.8 0.4 0.4  Alkaline Phos 38 - 126 U/L 63 66 61  AST 15 - 41 U/L 21 24 19   ALT 0 - 44 U/L 15 17 14     DIAGNOSTIC IMAGING:  I have independently reviewed the scans and discussed with the patient. No results found.   ASSESSMENT:  1. 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 -She underwent right third toe amputation at PIP level on 03/07/2020 for positive margins. Pathology did not show any residual melanoma. -CT chest and abdomen on 03/21/2020 did not show any evidence of metastatic disease. -Adjuvant immunotherapy with pembrolizumab 1 year recommended. -Pembrolizumab started on 03/25/2020. -CT CAP on 06/12/2020 did not show any evidence of metastatic disease in the chest, abdomen or pelvis. Mild emphysematous changes and respiratory bronchiolitis. -CT right foot with contrast on 06/13/2020 shows no evidence of osteomyelitis, abscess, septic arthritis or myositis. -MRI of the brain on 08/19/2020 did not show any evidence of metastatic disease. -CT CAP on 09/25/2020 did not show any evidence of metastatic disease.  2. Family history: -Mother died of throat cancer. Maternal grandmother had breast cancer. Maternal aunt had pancreatic cancer. Maternal grandfather had bone cancer. Maternal great grandmother died of melanoma. -She will benefit from genetic testing.  3. Right thyroid nodule: -PET scan on 12/20/2019 showed uptake in the right thyroid. -Ultrasound on 02/05/2020 did not show any suspicious nodules. -FNA on 05/01/2020  showed benign follicular nodule, Bethesda category 2.   PLAN:  1. 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 -However she reported left chest wall tenderness below the port which started 2 days ago.  She does not report any pleuritic type of chest pain. -Examination did not reveal any palpable masses except tenderness. -We held her treatment today.  I have done a portogram which showed fibrin sheath at the tip of the catheter.  The dye was flushing easily. -I have recommended alteplase.  Patient has some childcare issues and want to reschedule for treatment. -I have also recommended CT CAP prior to next visit in 3 weeks.  2. Microcytic anemia: -Hemoglobin today is 12.0.  No Feraheme needed.  3.Hypothyroidism: -Continue Synthroid 50 mcg daily.  TSH today is 3.6.   Orders placed this encounter:  No orders of the defined types were placed in this encounter.    Derek Jack, MD [place] [phone]   I, Milinda Antis, am acting as a scribe for Dr. Sanda Linger.  I, Derek Jack MD, have reviewed the above documentation for accuracy and completeness, and I agree with the above.

## 2020-12-26 NOTE — Progress Notes (Signed)
Patient presents today for Keytruda infusion.  Vital signs within parameters for treatment.  Labs pending.  Patients only complaint is that the area underneath her PORT into her upper breast and axilla hurts.  PORT is not giving blood return.  Advised to notify Dr. Delton Coombes of this issue.  Patient went to radiology per Dr. Delton Coombes to have a dye study due to PORT discomfort.    Patient returned to the floor after radiology stating that she could not stay for Keytruda infusion due to child care issues.  Patients PORT de accessed. No complaints at this time.  Discharge from clinic ambulatory in stable condition.  Alert and oriented X 3.  Follow up with [facility] as scheduled.

## 2020-12-26 NOTE — Progress Notes (Signed)
Venipuncture performed with a 23 gauge butterfly needle to L Antecubital.  Donna Munoz tolerated procedure well and without incident.

## 2020-12-26 NOTE — Patient Instructions (Signed)
Agency at [facility] Discharge Instructions  You were seen today by Dr. Delton Coombes. He went over your recent results. You will be scheduled to have an chest x-ray of your port to determine if it is still operational. Afterwards you will receive your treatment, today if possible. You will be scheduled to have a CT scan of your chest and abdomen before your next visit. Dr. Delton Coombes will see you back in 3 weeks for labs and follow up.   Thank you for choosing [facility] at [facility] to provide your oncology and hematology care.  To afford each patient quality time with our provider, please arrive at least 15 minutes before your scheduled appointment time.   If you have a lab appointment with the [facility] please come in thru the Main Entrance and check in at the main information desk  You need to re-schedule your appointment should you arrive 10 or more minutes late.  We strive to give you quality time with our providers, and arriving late affects you and other patients whose appointments are after yours.  Also, if you no show three or more times for appointments you may be dismissed from the clinic at the providers discretion.     Again, thank you for choosing [facility].  Our hope is that these requests will decrease the amount of time that you wait before being seen by our physicians.       _____________________________________________________________  Should you have questions after your visit to [facility], please contact our office at [phone] between the hours of 8:00 a.m. and 4:30 p.m.  Voicemails left after 4:00 p.m. will not be returned until the following business day.  For prescription refill requests, have your pharmacy contact our office and allow 72 hours.    Cancer Center Support Programs:   > Cancer Support Group  2nd Tuesday of the month 1pm-2pm, Journey Room

## 2020-12-26 NOTE — Progress Notes (Signed)
Patient was assessed by Dr. Delton Coombes and labs have been reviewed. Hold treatment until DG Fluro Rm 1-17min done. Primary RN and pharmacy aware.

## 2020-12-31 ENCOUNTER — Encounter (HOSPITAL_COMMUNITY): Payer: Self-pay

## 2020-12-31 ENCOUNTER — Other Ambulatory Visit: Payer: Self-pay

## 2020-12-31 ENCOUNTER — Inpatient Hospital Stay (HOSPITAL_COMMUNITY): Payer: Medicaid Other

## 2020-12-31 VITALS — BP 93/62 | HR 72 | Temp 97.3°F | Resp 17 | Wt 113.0 lb

## 2020-12-31 DIAGNOSIS — Z5112 Encounter for antineoplastic immunotherapy: Secondary | ICD-10-CM | POA: Diagnosis not present

## 2020-12-31 DIAGNOSIS — C439 Malignant melanoma of skin, unspecified: Secondary | ICD-10-CM

## 2020-12-31 LAB — COMPREHENSIVE METABOLIC PANEL
ALT: 14 U/L (ref 0–44)
AST: 20 U/L (ref 15–41)
Albumin: 3.8 g/dL (ref 3.5–5.0)
Alkaline Phosphatase: 59 U/L (ref 38–126)
Anion gap: 4 — ABNORMAL LOW (ref 5–15)
BUN: 7 mg/dL (ref 6–20)
CO2: 27 mmol/L (ref 22–32)
Calcium: 9.5 mg/dL (ref 8.9–10.3)
Chloride: 105 mmol/L (ref 98–111)
Creatinine, Ser: 0.59 mg/dL (ref 0.44–1.00)
GFR, Estimated: >60 mL/min (ref >60)
Glucose, Bld: 98 mg/dL (ref 70–99)
Potassium: 3.6 mmol/L (ref 3.5–5.1)
Sodium: 136 mmol/L (ref 135–145)
Total Bilirubin: 0.4 mg/dL (ref 0.3–1.2)
Total Protein: 6.7 g/dL (ref 6.5–8.1)

## 2020-12-31 LAB — CBC WITH DIFFERENTIAL/PLATELET
Abs Immature Granulocytes: 0.01 10*3/uL (ref 0.00–0.07)
Basophils Absolute: 0.1 10*3/uL (ref 0.0–0.1)
Basophils Relative: 1 %
Eosinophils Absolute: 0.3 10*3/uL (ref 0.0–0.5)
Eosinophils Relative: 5 %
HCT: 36.2 % (ref 36.0–46.0)
Hemoglobin: 11.7 g/dL — ABNORMAL LOW (ref 12.0–15.0)
Immature Granulocytes: 0 %
Lymphocytes Relative: 26 %
Lymphs Abs: 1.7 10*3/uL (ref 0.7–4.0)
MCH: 30.5 pg (ref 26.0–34.0)
MCHC: 32.3 g/dL (ref 30.0–36.0)
MCV: 94.5 fL (ref 80.0–100.0)
Monocytes Absolute: 0.3 10*3/uL (ref 0.1–1.0)
Monocytes Relative: 5 %
Neutro Abs: 4 10*3/uL (ref 1.7–7.7)
Neutrophils Relative %: 63 %
Platelets: 256 10*3/uL (ref 150–400)
RBC: 3.83 MIL/uL — ABNORMAL LOW (ref 3.87–5.11)
RDW: 14.2 % (ref 11.5–15.5)
WBC: 6.4 10*3/uL (ref 4.0–10.5)
nRBC: 0 % (ref 0.0–0.2)

## 2020-12-31 LAB — TSH: TSH: 4.007 u[IU]/mL (ref 0.350–4.500)

## 2020-12-31 MED ORDER — HEPARIN SOD (PORK) LOCK FLUSH 100 UNIT/ML IV SOLN
500.0000 [IU] | Freq: Once | INTRAVENOUS | Status: AC | PRN
  Administered 2020-12-31: 500 [IU]

## 2020-12-31 MED ORDER — SODIUM CHLORIDE 0.9% FLUSH
10.0000 mL | INTRAVENOUS | Status: DC | PRN
  Administered 2020-12-31: 10 mL

## 2020-12-31 MED ORDER — SODIUM CHLORIDE 0.9 % IV SOLN
200.0000 mg | Freq: Once | INTRAVENOUS | Status: AC
  Administered 2020-12-31: 200 mg via INTRAVENOUS
  Filled 2020-12-31: qty 8

## 2020-12-31 MED ORDER — SODIUM CHLORIDE 0.9 % IV SOLN: Freq: Once | INTRAVENOUS | Status: AC

## 2020-12-31 MED ORDER — ALTEPLASE 2 MG IJ SOLR
2.0000 mg | Freq: Once | INTRAMUSCULAR | Status: AC
  Administered 2020-12-31: 2 mg

## 2020-12-31 NOTE — Patient Instructions (Signed)
[facility] Discharge Instructions for Patients Receiving Chemotherapy  Today you received the following chemotherapy agents   To help prevent nausea and vomiting after your treatment, we encourage you to take your nausea medication   If you develop nausea and vomiting that is not controlled by your nausea medication, call the clinic.   BELOW ARE SYMPTOMS THAT SHOULD BE REPORTED IMMEDIATELY:  *FEVER GREATER THAN 100.5 F  *CHILLS WITH OR WITHOUT FEVER  NAUSEA AND VOMITING THAT IS NOT CONTROLLED WITH YOUR NAUSEA MEDICATION  *UNUSUAL SHORTNESS OF BREATH  *UNUSUAL BRUISING OR BLEEDING  TENDERNESS IN MOUTH AND THROAT WITH OR WITHOUT PRESENCE OF ULCERS  *URINARY PROBLEMS  *BOWEL PROBLEMS  UNUSUAL RASH Items with * indicate a potential emergency and should be followed up as soon as possible.  Feel free to call the clinic should you have any questions or concerns. The clinic phone number is [phone].  Please show the CHEMO ALERT CARD at check-in to the Emergency Department and triage nurse.   

## 2020-12-31 NOTE — Progress Notes (Signed)
Labs WNL for [place] today.  Alteplase 2 mL placed in port at 0845.   0915 10 mL blood withdrawn from port.  Flushed with 10 mL NS easily.  No pain or burning when flushed.   Tolerated treatment well today without incidence.  Vital signs stable prior to discharge.  Discharged ambulatory in stable condition.

## 2021-01-11 ENCOUNTER — Other Ambulatory Visit: Payer: Self-pay | Admitting: Internal Medicine

## 2021-01-14 ENCOUNTER — Other Ambulatory Visit: Payer: Self-pay

## 2021-01-14 ENCOUNTER — Ambulatory Visit (HOSPITAL_COMMUNITY)
Admission: RE | Admit: 2021-01-14 | Discharge: 2021-01-14 | Disposition: A | Discharge status: Home / Self Care | Payer: Medicaid Other | Source: Ambulatory Visit | Attending: Hematology | Admitting: Hematology

## 2021-01-14 DIAGNOSIS — C439 Malignant melanoma of skin, unspecified: Secondary | ICD-10-CM | POA: Insufficient documentation

## 2021-01-14 MED ORDER — IOHEXOL 300 MG/ML  SOLN
100.0000 mL | Freq: Once | INTRAMUSCULAR | Status: AC | PRN
  Administered 2021-01-14: 100 mL via INTRAVENOUS

## 2021-01-14 NOTE — Telephone Encounter (Signed)
Protonix not refilled at this time. Per last ov note from Dr. Harrington Challenger in November, pt was working on GI work up for narrowing, food getting stuck.  Should be refilled by GI or PCP.

## 2021-01-16 ENCOUNTER — Ambulatory Visit (HOSPITAL_COMMUNITY): Payer: Medicaid Other

## 2021-01-16 ENCOUNTER — Ambulatory Visit (HOSPITAL_COMMUNITY): Payer: Medicaid Other | Admitting: Hematology

## 2021-01-16 ENCOUNTER — Other Ambulatory Visit (HOSPITAL_COMMUNITY): Payer: Medicaid Other

## 2021-01-21 ENCOUNTER — Inpatient Hospital Stay (HOSPITAL_COMMUNITY): Payer: Medicaid Other | Attending: Hematology | Admitting: Hematology

## 2021-01-21 ENCOUNTER — Inpatient Hospital Stay (HOSPITAL_COMMUNITY): Payer: Medicaid Other

## 2021-01-21 ENCOUNTER — Other Ambulatory Visit: Payer: Self-pay

## 2021-01-21 VITALS — BP 103/60 | HR 60 | Temp 97.0°F | Resp 18

## 2021-01-21 VITALS — BP 101/67 | HR 76 | Temp 97.0°F | Resp 18 | Wt 110.5 lb

## 2021-01-21 DIAGNOSIS — Z5112 Encounter for antineoplastic immunotherapy: Secondary | ICD-10-CM | POA: Diagnosis present

## 2021-01-21 DIAGNOSIS — C4371 Malignant melanoma of right lower limb, including hip: Secondary | ICD-10-CM | POA: Diagnosis present

## 2021-01-21 DIAGNOSIS — C439 Malignant melanoma of skin, unspecified: Secondary | ICD-10-CM | POA: Diagnosis not present

## 2021-01-21 DIAGNOSIS — Z79899 Other long term (current) drug therapy: Secondary | ICD-10-CM | POA: Diagnosis not present

## 2021-01-21 LAB — CBC WITH DIFFERENTIAL/PLATELET
Abs Immature Granulocytes: 0.01 10*3/uL (ref 0.00–0.07)
Basophils Absolute: 0.1 10*3/uL (ref 0.0–0.1)
Basophils Relative: 1 %
Eosinophils Absolute: 0.3 10*3/uL (ref 0.0–0.5)
Eosinophils Relative: 5 %
HCT: 35.9 % — ABNORMAL LOW (ref 36.0–46.0)
Hemoglobin: 11.4 g/dL — ABNORMAL LOW (ref 12.0–15.0)
Immature Granulocytes: 0 %
Lymphocytes Relative: 35 %
Lymphs Abs: 1.8 10*3/uL (ref 0.7–4.0)
MCH: 29.5 pg (ref 26.0–34.0)
MCHC: 31.8 g/dL (ref 30.0–36.0)
MCV: 93 fL (ref 80.0–100.0)
Monocytes Absolute: 0.4 10*3/uL (ref 0.1–1.0)
Monocytes Relative: 8 %
Neutro Abs: 2.5 10*3/uL (ref 1.7–7.7)
Neutrophils Relative %: 51 %
Platelets: 245 10*3/uL (ref 150–400)
RBC: 3.86 MIL/uL — ABNORMAL LOW (ref 3.87–5.11)
RDW: 14.8 % (ref 11.5–15.5)
WBC: 5 10*3/uL (ref 4.0–10.5)
nRBC: 0 % (ref 0.0–0.2)

## 2021-01-21 LAB — COMPREHENSIVE METABOLIC PANEL
ALT: 14 U/L (ref 0–44)
AST: 23 U/L (ref 15–41)
Albumin: 3.8 g/dL (ref 3.5–5.0)
Alkaline Phosphatase: 58 U/L (ref 38–126)
Anion gap: 9 (ref 5–15)
BUN: 9 mg/dL (ref 6–20)
CO2: 24 mmol/L (ref 22–32)
Calcium: 9.5 mg/dL (ref 8.9–10.3)
Chloride: 105 mmol/L (ref 98–111)
Creatinine, Ser: 0.67 mg/dL (ref 0.44–1.00)
GFR, Estimated: >60 mL/min (ref >60)
Glucose, Bld: 118 mg/dL — ABNORMAL HIGH (ref 70–99)
Potassium: 3.3 mmol/L — ABNORMAL LOW (ref 3.5–5.1)
Sodium: 138 mmol/L (ref 135–145)
Total Bilirubin: 0.5 mg/dL (ref 0.3–1.2)
Total Protein: 7.2 g/dL (ref 6.5–8.1)

## 2021-01-21 MED ORDER — HEPARIN SOD (PORK) LOCK FLUSH 100 UNIT/ML IV SOLN
500.0000 [IU] | Freq: Once | INTRAVENOUS | Status: AC | PRN
  Administered 2021-01-21: 500 [IU]

## 2021-01-21 MED ORDER — SODIUM CHLORIDE 0.9 % IV SOLN: Freq: Once | INTRAVENOUS | Status: AC

## 2021-01-21 MED ORDER — SODIUM CHLORIDE 0.9% FLUSH
10.0000 mL | INTRAVENOUS | Status: DC | PRN
  Administered 2021-01-21: 10 mL

## 2021-01-21 MED ORDER — PEMBROLIZUMAB CHEMO INJECTION 100 MG/4ML
200.0000 mg | Freq: Once | INTRAVENOUS | Status: AC
  Administered 2021-01-21: 200 mg via INTRAVENOUS
  Filled 2021-01-21: qty 8

## 2021-01-21 NOTE — Progress Notes (Signed)
Patient presents today for treatment and follow up visit with Dr. Delton Coombes. Labs within parameters for treatment. Vital signs within parameters for treatment.   Message received from [place] LPN/ Dr. Delton Coombes to proceed with treatment today.   Treatment given today per MD orders. Tolerated infusion without adverse affects. Vital signs stable. No complaints at this time. Discharged from clinic ambulatory in stable condition. Alert and oriented x 3. F/U with [facility] as scheduled.

## 2021-01-21 NOTE — Patient Instructions (Signed)
[facility] at [facility] Discharge Instructions  You were seen today by Dr. Katragadda. He went over your recent results and scans. You received your treatment today; continue getting your treatment every 3 weeks. Dr. Katragadda will see you back in 6 weeks for labs and follow up.   Thank you for choosing [facility] at [facility] to provide your oncology and hematology care.  To afford each patient quality time with our provider, please arrive at least 15 minutes before your scheduled appointment time.   If you have a lab appointment with the Cancer Center please come in thru the Main Entrance and check in at the main information desk  You need to re-schedule your appointment should you arrive 10 or more minutes late.  We strive to give you quality time with our providers, and arriving late affects you and other patients whose appointments are after yours.  Also, if you no show three or more times for appointments you may be dismissed from the clinic at the providers discretion.     Again, thank you for choosing [facility].  Our hope is that these requests will decrease the amount of time that you wait before being seen by our physicians.       _____________________________________________________________  Should you have questions after your visit to [facility], please contact our office at [phone] between the hours of 8:00 a.m. and 4:30 p.m.  Voicemails left after 4:00 p.m. will not be returned until the following business day.  For prescription refill requests, have your pharmacy contact our office and allow 72 hours.    Cancer Center Support Programs:   > Cancer Support Group  2nd Tuesday of the month 1pm-2pm, Journey Room    

## 2021-01-21 NOTE — Progress Notes (Signed)
[place], [place] [address]   CLINIC:  Medical Oncology/Hematology  PCP:  Patient, No Pcp Per None None   REASON FOR VISIT:  Follow-up for malignant melanoma  PRIOR THERAPY: Resection of right third toe on 01/30/2020 with right third toe amputation at PIP joint on 03/07/2020  NGS Results: BRAF V 600 E and V 600 K negative  CURRENT THERAPY: Adjuvant Keytruda every 3 weeks  BRIEF ONCOLOGIC HISTORY:  Oncology History  Melanoma of skin ([place])  12/11/2019 Initial Diagnosis   Melanoma of skin ([facility])   02/19/2020 Genetic Testing   BRAF Mutation Analysis     03/25/2020 -  Chemotherapy    Patient is on Treatment Plan: MELANOMA PEMBROLIZUMAB Q21D        CANCER STAGING: Cancer Staging Melanoma of skin ([facility]) Staging form: Melanoma of the Skin, AJCC 8th Edition - Clinical stage from 02/08/2020: Stage III (cT2b, cN2a, cM0) - Unsigned   INTERVAL HISTORY:  Donna Munoz, a 45 y.o. female, returns for routine follow-up and consideration for next cycle of immunotherapy. Donna Munoz was last seen on 12/26/2020.  Due for cycle #13 of Keytruda today.   Overall, she tells me she has been feeling okay. She complains of having constant nausea which is well-controlled with Zofran mostly once a day, though she denies vomiting. She thinks that the scopolamine patch works better at controlling the nausea, though the skin where she applied the patch started eroding. She notes having the nausea since the morning and usually reclines at home for it to pass. She has noted having dry skin at the right anterior base of her neck, approximately 2-3 cm across, though denies itching. She is taking Synthroid daily and gabapentin PRN. Her appetite is okay and she denies having abdominal pain.  Overall, she feels ready for next cycle of immunotherapy today.    REVIEW OF SYSTEMS:  Review of Systems  Constitutional: Positive for appetite change (75%) and fatigue (50%).   Gastrointestinal: Positive for nausea (daily; on Zofran). Negative for abdominal pain and vomiting.  Skin: Negative for itching.  All other systems reviewed and are negative.   PAST MEDICAL/SURGICAL HISTORY:  Past Medical History:  Diagnosis Date  . Anemia   . Anxiety   . Depression   . GERD (gastroesophageal reflux disease)   . Headache   . Shingles   . Tobacco abuse 05/06/2020   Past Surgical History:  Procedure Laterality Date  . AMPUTATION TOE Right 03/07/2020   Procedure: RIGHT THIRD TOE AMPUTATION;  Surgeon: [name], MD;  Location: [facility];  Service: General;  Laterality: Right;  . BREAST LUMPECTOMY  age 20  . CESAREAN SECTION  01/06/2006  . CESAREAN SECTION  10/15/2016  . DENTAL SURGERY  2019  . INGUINAL HERNIA REPAIR Right 01/30/2020   Procedure: RIGHT INGUINAL HERNIA REPAIR WITH  MESH;  Surgeon: [name], MD;  Location: [facility];  Service: General;  Laterality: Right;  . INSERTION OF MESH Right 01/30/2020   Procedure: INSERTION OF MESH;  Surgeon: [name], MD;  Location: [facility];  Service: General;  Laterality: Right;  . IR CV LINE INJECTION  09/11/2020  . IR IMAGING GUIDED PORT INSERTION  09/12/2020  . IR REMOVAL TUN ACCESS W/ PORT W/O FL MOD SED  09/12/2020  . IRRIGATION AND DEBRIDEMENT ABSCESS Right 03/07/2020   Procedure: Aspiration of Right Groin Seroma;  Surgeon: [name], MD;  Location: [facility];  Service: General;  Laterality: Right;  .  MELANOMA EXCISION WITH SENTINEL LYMPH NODE BIOPSY Right 01/30/2020   Procedure: WIDE LOWER EXCISION RIGHT THIRD TOE MELANOMA EXCISION WITH SENTINEL LYMPH NODE BIOPSY;  Surgeon: [name], MD;  Location: [facility];  Service: General;  Laterality: Right;  . PORTACATH PLACEMENT N/A 03/07/2020   Procedure: INSERTION PORT-A-CATH;  Surgeon: [name], MD;  Location: [facility];  Service: General;  Laterality: Left    SOCIAL HISTORY:  Social History   Socioeconomic  History  . Marital status: Legally Separated    Spouse name: Not on file  . Number of children: 2  . Years of education: Not on file  . Highest education level: Not on file  Occupational History  . Occupation: unemployed d/t COVID  Tobacco Use  . Smoking status: Current Every Day Smoker    Packs/day: 1.00  . Smokeless tobacco: Never Used  Vaping Use  . Vaping Use: Never used  Substance and Sexual Activity  . Alcohol use: No  . Drug use: Yes    Types: Marijuana  . Sexual activity: Yes  Other Topics Concern  . Not on file  Social History Narrative  . Not on file   Social Determinants of Health   Financial Resource Strain: Not on file  Food Insecurity: Not on file  Transportation Needs: No Transportation Needs  . Lack of Transportation (Medical): No  . Lack of Transportation (Non-Medical): No  Physical Activity: Inactive  . Days of Exercise per Week: 0 days  . Minutes of Exercise per Session: 0 min  Stress: Not on file  Social Connections: Not on file  Intimate Partner Violence: Not At Risk  . Fear of Current or Ex-Partner: No  . Emotionally Abused: No  . Physically Abused: No  . Sexually Abused: No    FAMILY HISTORY:  Family History  Problem Relation Age of Onset  . Heart disease Mother   . Cancer Mother   . Ovarian cysts Sister   . Healthy Brother   . Cancer Maternal Grandmother   . Heart attack Maternal Grandmother   . Cancer Maternal Grandfather   . Cancer Paternal Grandmother   . Healthy Sister   . Healthy Sister   . Psoriasis Daughter   . Healthy Son     CURRENT MEDICATIONS:  Current Outpatient Medications  Medication Sig Dispense Refill  . gabapentin (NEURONTIN) 100 MG capsule Take by mouth daily.     Marland Kitchen levothyroxine (SYNTHROID) 50 MCG tablet TAKE 1 TABLET BY MOUTH DAILY BEFORE BREAKFAST 30 tablet 1  . lidocaine-prilocaine (EMLA) cream Apply a small amount to port a cath site (do not rub in) and cover with plastic wrap 1 hour prior to chemotherapy  appointments 30 g 3  . ondansetron (ZOFRAN ODT) 4 MG disintegrating tablet Take 1 tablet (4 mg total) by mouth every 8 (eight) hours as needed for nausea or vomiting. 30 tablet 0  . oxyCODONE (OXY IR/ROXICODONE) 5 MG immediate release tablet Take 1 tablet (5 mg total) by mouth every 6 (six) hours as needed for severe pain. 40 tablet 0  . pantoprazole (PROTONIX) 40 MG tablet Take 1 tablet (40 mg total) by mouth daily. 30 tablet 2  . Pembrolizumab (KEYTRUDA IV) Inject into the vein every 21 ( twenty-one) days.    . TRANSDERM-SCOP 1 MG/3DAYS 1 patch every 3 (three) days.     No current facility-administered medications for this visit.    ALLERGIES:  No Known Allergies  PHYSICAL EXAM:  Performance status (ECOG): 0 - Asymptomatic  Vitals:  01/21/21 0907  BP: 101/67  Pulse: 76  Resp: 18  Temp: (!) 97 F (36.1 C)  SpO2: 100%   Wt Readings from Last 3 Encounters:  01/21/21 110 lb 8 oz (50.1 kg)  12/31/20 113 lb (51.3 kg)  12/26/20 110 lb 0.2 oz (49.9 kg)   Physical Exam Vitals reviewed.  Constitutional:      Appearance: Normal appearance.  Cardiovascular:     Rate and Rhythm: Normal rate and regular rhythm.     Pulses: Normal pulses.     Heart sounds: Normal heart sounds.  Pulmonary:     Effort: Pulmonary effort is normal.     Breath sounds: Normal breath sounds.  Chest:     Comments: Port-a-Cath in L chest Abdominal:     Palpations: Abdomen is soft. There is no mass.     Tenderness: There is no abdominal tenderness.     Hernia: No hernia is present.  Musculoskeletal:     Right lower leg: No edema.     Left lower leg: No edema.  Skin:    Findings: No erythema or wound.  Neurological:     General: No focal deficit present.     Mental Status: She is alert and oriented to person, place, and time.  Psychiatric:        Mood and Affect: Mood normal.        Behavior: Behavior normal.     LABORATORY DATA:  I have reviewed the labs as listed.  CBC Latest Ref Rng & Units  01/21/2021 12/31/2020 12/26/2020  WBC 4.0 - 10.5 K/uL 5.0 6.4 5.2  Hemoglobin 12.0 - 15.0 g/dL 11.4(L) 11.7(L) 12.0  Hematocrit 36.0 - 46.0 % 35.9(L) 36.2 37.9  Platelets 150 - 400 K/uL 245 256 256   CMP Latest Ref Rng & Units 01/21/2021 12/31/2020 12/26/2020  Glucose 70 - 99 mg/dL 118(H) 98 88  BUN 6 - 20 mg/dL 9 7 8   Creatinine 0.44 - 1.00 mg/dL 0.67 0.59 0.64  Sodium 135 - 145 mmol/L 138 136 134(L)  Potassium 3.5 - 5.1 mmol/L 3.3(L) 3.6 3.7  Chloride 98 - 111 mmol/L 105 105 105  CO2 22 - 32 mmol/L 24 27 23   Calcium 8.9 - 10.3 mg/dL 9.5 9.5 9.5  Total Protein 6.5 - 8.1 g/dL 7.2 6.7 6.9  Total Bilirubin 0.3 - 1.2 mg/dL 0.5 0.4 0.8  Alkaline Phos 38 - 126 U/L 58 59 63  AST 15 - 41 U/L 23 20 21   ALT 0 - 44 U/L 14 14 15     DIAGNOSTIC IMAGING:  I have independently reviewed the scans and discussed with the patient. CT CHEST ABDOMEN PELVIS W CONTRAST  Result Date: 01/15/2021 CLINICAL DATA:  Melanoma diagnosed in December 2020. Assess treatment response. EXAM: CT CHEST, ABDOMEN, AND PELVIS WITH CONTRAST TECHNIQUE: Multidetector CT imaging of the chest, abdomen and pelvis was performed following the standard protocol during bolus administration of intravenous contrast. CONTRAST:  174m OMNIPAQUE IOHEXOL 300 MG/ML  SOLN COMPARISON:  09/25/2020 CT chest, abdomen and pelvis. FINDINGS: CT CHEST FINDINGS Cardiovascular: Normal heart size. No significant pericardial effusion/thickening. Great vessels are normal in course and caliber. No central pulmonary emboli. Mediastinum/Nodes: No discrete thyroid nodules. Unremarkable esophagus. No pathologically enlarged axillary, mediastinal or hilar lymph nodes. Similar triangular focus of mixed soft tissue and speckled fat density in the anterior mediastinum compatible with mild thymic hyperplasia. Lungs/Pleura: No pneumothorax. No pleural effusion. Mild centrilobular and paraseptal emphysema with mild diffuse bronchial wall thickening. No acute consolidative airspace  disease, lung masses or significant pulmonary nodules. Musculoskeletal:  No aggressive appearing focal osseous lesions. CT ABDOMEN PELVIS FINDINGS Hepatobiliary: Normal liver size. Three scattered subcentimeter hypodense liver lesions, too small to characterize, all stable. No new liver lesions. Normal gallbladder with no radiopaque cholelithiasis. No biliary ductal dilatation. Pancreas: Normal, with no mass or duct dilation. Spleen: Normal size. No mass. Adrenals/Urinary Tract: Normal adrenals. Normal kidneys with no hydronephrosis and no renal mass. Normal bladder. Stomach/Bowel: Normal non-distended stomach. Normal caliber small bowel with no small bowel wall thickening. Oral contrast transits to the left colon. Appendix not discretely visualized. Normal large bowel with no diverticulosis, large bowel wall thickening or pericolonic fat stranding. Vascular/Lymphatic: Atherosclerotic nonaneurysmal abdominal aorta. Patent portal, splenic, hepatic and renal veins. Surgical clips again noted in the right inguinal region. No pathologically enlarged lymph nodes in the abdomen or pelvis. Reproductive: Grossly normal uterus.  No adnexal mass. Other: No pneumoperitoneum, ascites or focal fluid collection. Musculoskeletal: No aggressive appearing focal osseous lesions. IMPRESSION: 1. No evidence of metastatic disease in the chest, abdomen or pelvis. 2. Mild thymic hyperplasia. 3. Aortic Atherosclerosis (ICD10-I70.0) and Emphysema (ICD10-J43.9). Electronically Signed   By: Ilona Sorrel M.D.   On: 01/15/2021 09:42   DG CV Line Injection  Result Date: 12/26/2020 INDICATION: Melanoma, no blood return from Port-A-Cath EXAM: CONTRAST INJECTION OF PORT A CATH UNDER FLUOROSCOPY CONTRAST:  70m OMNIPAQUE IOHEXOL 300 MG/ML  SOLN IV TECHNIQUE: Contrast was administered via the indwelling port after it was accessed. Fluoroscopic spot images were obtained of the catheter during injection ANESTHESIA/SEDATION: None FLUOROSCOPY TIME:   Radiation Exposure Index (as provided by the fluoroscopic device): 3.1 If the device does not provide the exposure index: Fluoroscopy Time:  0 minutes and 18 seconds Number of Acquired Images:  Insert spots COMPLICATIONS: None immediate. TECHNIQUE: Port-A-Cath was accessed at time of presentation of patient to the department. Contrast was injected into the Port-A-Cath and images were obtained. FINDINGS: No contrast extravasation from reservoir or from the reservoir/catheter junction. Port-A-Cath catheter is patent with tip located in the SVC. No jet of contrast is seen exiting the catheter tip with injection. Instead, contrast is seen tracking back along the distal catheter before exiting into the a SVC. Findings are consistent with a fibrin sheath at the distal catheter. IMPRESSION: Fibrin sheath at distal catheter. Electronically Signed   By: MLavonia DanaM.D.   On: 12/26/2020 12:56     ASSESSMENT:  1. 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 -She underwent right third toe amputation at PIP level on 03/07/2020 for positive margins. Pathology did not show any residual melanoma. -CT chest and abdomen on 03/21/2020 did not show any evidence of metastatic disease. -Adjuvant immunotherapy with pembrolizumab 1 year recommended. -Pembrolizumab started on 03/25/2020. -CT CAP on 06/12/2020 did not show any evidence of metastatic disease in the chest, abdomen or pelvis. Mild emphysematous changes and respiratory bronchiolitis. -CT right foot with contrast on 06/13/2020 shows no evidence of osteomyelitis, abscess, septic arthritis or myositis. -MRI of the brain on 08/19/2020 did not show any evidence of metastatic disease. -CT CAP on 09/25/2020 did not show any evidence of metastatic disease.  2. Family  history: -Mother died of throat cancer. Maternal grandmother had breast cancer. Maternal aunt had pancreatic cancer. Maternal grandfather had bone cancer. Maternal great grandmother died of melanoma. -She  will benefit from genetic testing.  3. Right thyroid nodule: -PET scan on 12/20/2019 showed uptake in the right thyroid. -Ultrasound on 02/05/2020 did not show any suspicious nodules. -FNA on 05/01/2020 showed benign follicular nodule, Bethesda category 2.   PLAN:  1. Stage IIIb (T2B N2A) malignant melanoma of the right third toe, BRAF V6 100 E-: -No immunotherapy related side effects. -Reviewed CT CAP from 01/14/2021 which did not show any evidence of recurrence or metastatic disease.  Mild thymic hyperplasia noted which is stable compared to last scan. -Reviewed labs from today which showed normal LFTs and CBC.  Last TSH was 4.0. -Proceed with treatment today and in 3 weeks.  RTC 6 weeks for follow-up. -She will likely finish immunotherapy in May.  She has missed 1 dose in the past.   2. Microcytic anemia: -Hemoglobin today is 12.0.  No Feraheme needed.  3.Hypothyroidism: -Continue Synthroid 50 mcg daily.  TSH today is 3.6.   Orders placed this encounter:  Orders Placed This Encounter  Procedures  . CBC with Differential/Platelet  . Comprehensive metabolic panel  . Magnesium  . TSH     Derek Jack, MD [facility] [phone]   I, Milinda Antis, am acting as a scribe for Dr. Sanda Linger.  I, Derek Jack MD, have reviewed the above documentation for accuracy and completeness, and I agree with the above.

## 2021-01-21 NOTE — Progress Notes (Signed)
Patient was assessed by Dr. Katragadda and labs have been reviewed.  Patient is okay to proceed with treatment today. Primary RN and pharmacy aware.   

## 2021-01-21 NOTE — Patient Instructions (Signed)
[facility] Discharge Instructions for Patients Receiving Chemotherapy  Today you received the following Keytruda chemotherapy agent.   To help prevent nausea and vomiting after your treatment, we encourage you to take your nausea medication.    If you develop nausea and vomiting that is not controlled by your nausea medication, call the clinic.   BELOW ARE SYMPTOMS THAT SHOULD BE REPORTED IMMEDIATELY:  *FEVER GREATER THAN 100.5 F  *CHILLS WITH OR WITHOUT FEVER  NAUSEA AND VOMITING THAT IS NOT CONTROLLED WITH YOUR NAUSEA MEDICATION  *UNUSUAL SHORTNESS OF BREATH  *UNUSUAL BRUISING OR BLEEDING  TENDERNESS IN MOUTH AND THROAT WITH OR WITHOUT PRESENCE OF ULCERS  *URINARY PROBLEMS  *BOWEL PROBLEMS  UNUSUAL RASH Items with * indicate a potential emergency and should be followed up as soon as possible.  Feel free to call the clinic should you have any questions or concerns. The clinic phone number is [phone].  Please show the [facility] at check-in to the Emergency Department and triage nurse.

## 2021-02-05 ENCOUNTER — Other Ambulatory Visit: Payer: Self-pay | Admitting: Internal Medicine

## 2021-02-11 ENCOUNTER — Inpatient Hospital Stay (HOSPITAL_COMMUNITY): Payer: Medicaid Other

## 2021-02-11 ENCOUNTER — Inpatient Hospital Stay (HOSPITAL_COMMUNITY): Payer: Medicaid Other | Attending: Hematology

## 2021-02-11 ENCOUNTER — Other Ambulatory Visit (HOSPITAL_COMMUNITY): Payer: Self-pay | Admitting: Physician Assistant

## 2021-02-11 ENCOUNTER — Other Ambulatory Visit: Payer: Self-pay

## 2021-02-11 VITALS — BP 96/64 | HR 63 | Temp 97.3°F | Resp 18

## 2021-02-11 DIAGNOSIS — Z5112 Encounter for antineoplastic immunotherapy: Secondary | ICD-10-CM | POA: Insufficient documentation

## 2021-02-11 DIAGNOSIS — C439 Malignant melanoma of skin, unspecified: Secondary | ICD-10-CM

## 2021-02-11 DIAGNOSIS — C4371 Malignant melanoma of right lower limb, including hip: Secondary | ICD-10-CM | POA: Insufficient documentation

## 2021-02-11 DIAGNOSIS — Z79899 Other long term (current) drug therapy: Secondary | ICD-10-CM | POA: Insufficient documentation

## 2021-02-11 DIAGNOSIS — N644 Mastodynia: Secondary | ICD-10-CM

## 2021-02-11 LAB — COMPREHENSIVE METABOLIC PANEL
ALT: 14 U/L (ref 0–44)
AST: 24 U/L (ref 15–41)
Albumin: 3.7 g/dL (ref 3.5–5.0)
Alkaline Phosphatase: 57 U/L (ref 38–126)
Anion gap: 7 (ref 5–15)
BUN: 7 mg/dL (ref 6–20)
CO2: 22 mmol/L (ref 22–32)
Calcium: 9.2 mg/dL (ref 8.9–10.3)
Chloride: 106 mmol/L (ref 98–111)
Creatinine, Ser: 0.63 mg/dL (ref 0.44–1.00)
GFR, Estimated: >60 mL/min (ref >60)
Glucose, Bld: 117 mg/dL — ABNORMAL HIGH (ref 70–99)
Potassium: 3.3 mmol/L — ABNORMAL LOW (ref 3.5–5.1)
Sodium: 135 mmol/L (ref 135–145)
Total Bilirubin: 0.6 mg/dL (ref 0.3–1.2)
Total Protein: 6.7 g/dL (ref 6.5–8.1)

## 2021-02-11 LAB — CBC WITH DIFFERENTIAL/PLATELET
Abs Immature Granulocytes: 0.02 10*3/uL (ref 0.00–0.07)
Basophils Absolute: 0 10*3/uL (ref 0.0–0.1)
Basophils Relative: 1 %
Eosinophils Absolute: 0.3 10*3/uL (ref 0.0–0.5)
Eosinophils Relative: 4 %
HCT: 35.4 % — ABNORMAL LOW (ref 36.0–46.0)
Hemoglobin: 11.4 g/dL — ABNORMAL LOW (ref 12.0–15.0)
Immature Granulocytes: 0 %
Lymphocytes Relative: 27 %
Lymphs Abs: 1.5 10*3/uL (ref 0.7–4.0)
MCH: 29.1 pg (ref 26.0–34.0)
MCHC: 32.2 g/dL (ref 30.0–36.0)
MCV: 90.3 fL (ref 80.0–100.0)
Monocytes Absolute: 0.3 10*3/uL (ref 0.1–1.0)
Monocytes Relative: 6 %
Neutro Abs: 3.5 10*3/uL (ref 1.7–7.7)
Neutrophils Relative %: 62 %
Platelets: 247 10*3/uL (ref 150–400)
RBC: 3.92 MIL/uL (ref 3.87–5.11)
RDW: 15.2 % (ref 11.5–15.5)
WBC: 5.6 10*3/uL (ref 4.0–10.5)
nRBC: 0 % (ref 0.0–0.2)

## 2021-02-11 LAB — TSH: TSH: 7.207 u[IU]/mL — ABNORMAL HIGH (ref 0.350–4.500)

## 2021-02-11 LAB — MAGNESIUM: Magnesium: 1.9 mg/dL (ref 1.7–2.4)

## 2021-02-11 MED ORDER — SODIUM CHLORIDE 0.9 % IV SOLN
200.0000 mg | Freq: Once | INTRAVENOUS | Status: AC
  Administered 2021-02-11: 200 mg via INTRAVENOUS
  Filled 2021-02-11: qty 8

## 2021-02-11 MED ORDER — SODIUM CHLORIDE 0.9 % IV SOLN: Freq: Once | INTRAVENOUS | Status: AC

## 2021-02-11 MED ORDER — SODIUM CHLORIDE 0.9% FLUSH
10.0000 mL | INTRAVENOUS | Status: DC | PRN
  Administered 2021-02-11: 10 mL

## 2021-02-11 MED ORDER — HEPARIN SOD (PORK) LOCK FLUSH 100 UNIT/ML IV SOLN
500.0000 [IU] | Freq: Once | INTRAVENOUS | Status: AC | PRN
Start: 2021-02-11 — End: 2021-02-11
  Administered 2021-02-11: 500 [IU]

## 2021-02-11 NOTE — Patient Instructions (Signed)
[facility] Discharge Instructions for Patients Receiving Chemotherapy  Today you received the following chemotherapy agents   To help prevent nausea and vomiting after your treatment, we encourage you to take your nausea medication   If you develop nausea and vomiting that is not controlled by your nausea medication, call the clinic.   BELOW ARE SYMPTOMS THAT SHOULD BE REPORTED IMMEDIATELY:  *FEVER GREATER THAN 100.5 F  *CHILLS WITH OR WITHOUT FEVER  NAUSEA AND VOMITING THAT IS NOT CONTROLLED WITH YOUR NAUSEA MEDICATION  *UNUSUAL SHORTNESS OF BREATH  *UNUSUAL BRUISING OR BLEEDING  TENDERNESS IN MOUTH AND THROAT WITH OR WITHOUT PRESENCE OF ULCERS  *URINARY PROBLEMS  *BOWEL PROBLEMS  UNUSUAL RASH Items with * indicate a potential emergency and should be followed up as soon as possible.  Feel free to call the clinic should you have any questions or concerns. The clinic phone number is [phone].  Please show the CHEMO ALERT CARD at check-in to the Emergency Department and triage nurse.   

## 2021-02-11 NOTE — Progress Notes (Signed)
Patient seen and examined today during her Keytruda infusion (melanoma), due to complaints of left breast pain.  Patient reports that 2 treatment cycles ago she noticed a soreness near her port just above her left breast, which self resolved after 3 to 4 days.  She reports that this pain has returned, started 4 days ago.  She reports a tenderness in her left axilla rating eating forward to her left lateral breast.    Reports that she had cyst excision from her left breast at age 44.  On exam, noted to have left nipple inversion and discharge, but patient reports that this has been chronic and ongoing since a cyst excision at age 65.  Left lateral breast is tender to palpation.  No fixed nodularity, but possible cystic mass on the left lateral side.  No palpable lymphadenopathy.  Discussed with Dr. Delton Coombes, who recommends patient to be scheduled for screening mammogram.  Patient also informed to treat symptoms with warm or cold compresses and NSAIDs.

## 2021-02-11 NOTE — Addendum Note (Signed)
Addended by: Tarri Abernethy on: 02/11/2021 11:31 AM   Modules accepted: Orders

## 2021-02-11 NOTE — Progress Notes (Signed)
Labs reviewed today, labs meet parameters for treatment. Proceed as planned.   Patient still complaining of left breast tenderness, soreness, more so than before. Notified MD and NP, NP will come to room to evaluate further.   Treatment given per orders. Patient tolerated it well without problems. Vitals stable and discharged home from clinic ambulatory. Follow up as scheduled.

## 2021-02-11 NOTE — Addendum Note (Signed)
Addended by: Tarri Abernethy on: 02/11/2021 11:48 AM   Modules accepted: Orders

## 2021-02-13 ENCOUNTER — Encounter (HOSPITAL_COMMUNITY): Payer: Self-pay

## 2021-02-13 ENCOUNTER — Other Ambulatory Visit (HOSPITAL_COMMUNITY): Payer: Self-pay | Admitting: Physician Assistant

## 2021-02-13 ENCOUNTER — Ambulatory Visit (HOSPITAL_COMMUNITY)
Admission: RE | Admit: 2021-02-13 | Discharge: 2021-02-13 | Disposition: A | Discharge status: Home / Self Care | Payer: Medicaid Other | Source: Ambulatory Visit | Attending: Physician Assistant | Admitting: Physician Assistant

## 2021-02-13 ENCOUNTER — Other Ambulatory Visit: Payer: Self-pay

## 2021-02-13 DIAGNOSIS — R928 Other abnormal and inconclusive findings on diagnostic imaging of breast: Secondary | ICD-10-CM

## 2021-02-13 DIAGNOSIS — N6452 Nipple discharge: Secondary | ICD-10-CM | POA: Diagnosis not present

## 2021-02-13 DIAGNOSIS — N6489 Other specified disorders of breast: Secondary | ICD-10-CM | POA: Diagnosis not present

## 2021-02-13 DIAGNOSIS — R922 Inconclusive mammogram: Secondary | ICD-10-CM | POA: Diagnosis not present

## 2021-02-13 DIAGNOSIS — N644 Mastodynia: Secondary | ICD-10-CM

## 2021-02-13 DIAGNOSIS — N6311 Unspecified lump in the right breast, upper outer quadrant: Secondary | ICD-10-CM | POA: Diagnosis not present

## 2021-02-18 ENCOUNTER — Ambulatory Visit (HOSPITAL_COMMUNITY)
Admission: RE | Admit: 2021-02-18 | Discharge: 2021-02-18 | Disposition: A | Discharge status: Home / Self Care | Payer: Medicaid Other | Source: Ambulatory Visit | Attending: Physician Assistant | Admitting: Physician Assistant

## 2021-02-18 ENCOUNTER — Other Ambulatory Visit (HOSPITAL_COMMUNITY): Payer: Self-pay | Admitting: Physician Assistant

## 2021-02-18 DIAGNOSIS — R928 Other abnormal and inconclusive findings on diagnostic imaging of breast: Secondary | ICD-10-CM | POA: Diagnosis not present

## 2021-02-18 DIAGNOSIS — N6311 Unspecified lump in the right breast, upper outer quadrant: Secondary | ICD-10-CM | POA: Diagnosis not present

## 2021-02-18 DIAGNOSIS — D241 Benign neoplasm of right breast: Secondary | ICD-10-CM | POA: Diagnosis not present

## 2021-02-18 MED ORDER — SODIUM BICARBONATE 4.2 % IV SOLN
INTRAVENOUS | Status: AC
  Filled 2021-02-18: qty 10

## 2021-02-18 MED ORDER — LIDOCAINE-EPINEPHRINE (PF) 1 %-1:200000 IJ SOLN
INTRAMUSCULAR | Status: AC
  Filled 2021-02-18: qty 30

## 2021-02-18 MED ORDER — LIDOCAINE HCL (PF) 2 % IJ SOLN
INTRAMUSCULAR | Status: AC
  Filled 2021-02-18: qty 10

## 2021-02-19 LAB — SURGICAL PATHOLOGY

## 2021-03-04 ENCOUNTER — Inpatient Hospital Stay (HOSPITAL_COMMUNITY): Payer: Medicaid Other

## 2021-03-04 ENCOUNTER — Ambulatory Visit (HOSPITAL_COMMUNITY): Payer: Medicaid Other

## 2021-03-04 ENCOUNTER — Ambulatory Visit (HOSPITAL_COMMUNITY): Payer: Medicaid Other | Admitting: Hematology

## 2021-03-04 ENCOUNTER — Other Ambulatory Visit (HOSPITAL_COMMUNITY): Payer: Medicaid Other

## 2021-03-12 ENCOUNTER — Inpatient Hospital Stay (HOSPITAL_BASED_OUTPATIENT_CLINIC_OR_DEPARTMENT_OTHER): Payer: Medicaid Other | Admitting: Hematology

## 2021-03-12 ENCOUNTER — Other Ambulatory Visit: Payer: Self-pay

## 2021-03-12 ENCOUNTER — Inpatient Hospital Stay (HOSPITAL_COMMUNITY): Payer: Medicaid Other

## 2021-03-12 ENCOUNTER — Inpatient Hospital Stay (HOSPITAL_COMMUNITY): Payer: Medicaid Other | Attending: Hematology

## 2021-03-12 VITALS — BP 101/69 | HR 69 | Temp 97.0°F | Resp 18 | Wt 112.2 lb

## 2021-03-12 VITALS — BP 103/57 | HR 77 | Temp 97.5°F | Resp 18

## 2021-03-12 DIAGNOSIS — E039 Hypothyroidism, unspecified: Secondary | ICD-10-CM | POA: Diagnosis not present

## 2021-03-12 DIAGNOSIS — Z9221 Personal history of antineoplastic chemotherapy: Secondary | ICD-10-CM | POA: Diagnosis not present

## 2021-03-12 DIAGNOSIS — C439 Malignant melanoma of skin, unspecified: Secondary | ICD-10-CM | POA: Diagnosis not present

## 2021-03-12 DIAGNOSIS — Z79899 Other long term (current) drug therapy: Secondary | ICD-10-CM | POA: Diagnosis not present

## 2021-03-12 DIAGNOSIS — C4371 Malignant melanoma of right lower limb, including hip: Secondary | ICD-10-CM | POA: Insufficient documentation

## 2021-03-12 DIAGNOSIS — Z5112 Encounter for antineoplastic immunotherapy: Secondary | ICD-10-CM | POA: Diagnosis present

## 2021-03-12 DIAGNOSIS — D509 Iron deficiency anemia, unspecified: Secondary | ICD-10-CM | POA: Diagnosis not present

## 2021-03-12 LAB — CBC WITH DIFFERENTIAL/PLATELET
Abs Immature Granulocytes: 0.01 10*3/uL (ref 0.00–0.07)
Basophils Absolute: 0.1 10*3/uL (ref 0.0–0.1)
Basophils Relative: 1 %
Eosinophils Absolute: 0.3 10*3/uL (ref 0.0–0.5)
Eosinophils Relative: 4 %
HCT: 36.3 % (ref 36.0–46.0)
Hemoglobin: 11.4 g/dL — ABNORMAL LOW (ref 12.0–15.0)
Immature Granulocytes: 0 %
Lymphocytes Relative: 36 %
Lymphs Abs: 2 10*3/uL (ref 0.7–4.0)
MCH: 27.8 pg (ref 26.0–34.0)
MCHC: 31.4 g/dL (ref 30.0–36.0)
MCV: 88.5 fL (ref 80.0–100.0)
Monocytes Absolute: 0.4 10*3/uL (ref 0.1–1.0)
Monocytes Relative: 8 %
Neutro Abs: 2.9 10*3/uL (ref 1.7–7.7)
Neutrophils Relative %: 51 %
Platelets: 270 10*3/uL (ref 150–400)
RBC: 4.1 MIL/uL (ref 3.87–5.11)
RDW: 15.9 % — ABNORMAL HIGH (ref 11.5–15.5)
WBC: 5.6 10*3/uL (ref 4.0–10.5)
nRBC: 0 % (ref 0.0–0.2)

## 2021-03-12 LAB — COMPREHENSIVE METABOLIC PANEL WITH GFR
ALT: 15 U/L (ref 0–44)
AST: 23 U/L (ref 15–41)
Albumin: 3.8 g/dL (ref 3.5–5.0)
Alkaline Phosphatase: 62 U/L (ref 38–126)
Anion gap: 6 (ref 5–15)
BUN: 8 mg/dL (ref 6–20)
CO2: 24 mmol/L (ref 22–32)
Calcium: 9.3 mg/dL (ref 8.9–10.3)
Chloride: 105 mmol/L (ref 98–111)
Creatinine, Ser: 0.62 mg/dL (ref 0.44–1.00)
GFR, Estimated: >60 mL/min
Glucose, Bld: 97 mg/dL (ref 70–99)
Potassium: 3.3 mmol/L — ABNORMAL LOW (ref 3.5–5.1)
Sodium: 135 mmol/L (ref 135–145)
Total Bilirubin: 0.4 mg/dL (ref 0.3–1.2)
Total Protein: 7 g/dL (ref 6.5–8.1)

## 2021-03-12 LAB — TSH: TSH: 8.307 u[IU]/mL — ABNORMAL HIGH (ref 0.350–4.500)

## 2021-03-12 MED ORDER — HEPARIN SOD (PORK) LOCK FLUSH 100 UNIT/ML IV SOLN
500.0000 [IU] | Freq: Once | INTRAVENOUS | Status: AC | PRN
  Administered 2021-03-12: 500 [IU]

## 2021-03-12 MED ORDER — SODIUM CHLORIDE 0.9% FLUSH
10.0000 mL | INTRAVENOUS | Status: DC | PRN
  Administered 2021-03-12: 10 mL

## 2021-03-12 MED ORDER — SODIUM CHLORIDE 0.9 % IV SOLN: Freq: Once | INTRAVENOUS | Status: AC

## 2021-03-12 MED ORDER — SODIUM CHLORIDE 0.9 % IV SOLN
200.0000 mg | Freq: Once | INTRAVENOUS | Status: AC
  Administered 2021-03-12: 200 mg via INTRAVENOUS
  Filled 2021-03-12: qty 8

## 2021-03-12 MED ORDER — ALTEPLASE 2 MG IJ SOLR
INTRAMUSCULAR | Status: AC
  Filled 2021-03-12: qty 2

## 2021-03-12 MED ORDER — STERILE WATER FOR INJECTION IJ SOLN
INTRAMUSCULAR | Status: AC
  Filled 2021-03-12: qty 10

## 2021-03-12 NOTE — Progress Notes (Signed)
0820-accessed port for labs today. No blood return at this time. Will do Altaplase protocol.  0905-checked for blood return, none at this time.   0945-checked for blood return, none at this time. Was able to remove 1.31ml of the altaplase. Will proceed at planned per protocol.      Labs reviewed with MD today. No changes in treatment. Will proceed as planned per MD.   Treatment given per orders. Patient tolerated it well without problems. Vitals stable and discharged home from clinic ambulatory. Follow up as scheduled.

## 2021-03-12 NOTE — Patient Instructions (Signed)
[place] at [facility] Discharge Instructions  You were seen today by Dr. Delton Coombes. He went over your recent results. You received your treatment today. Start taking Synthroid daily. Dr. Delton Coombes will see you back in 3 weeks for labs and follow up.   Thank you for choosing [place] at [facility] to provide your oncology and hematology care.  To afford each patient quality time with our provider, please arrive at least 15 minutes before your scheduled appointment time.   If you have a lab appointment with the [facility] please come in thru the Main Entrance and check in at the main information desk  You need to re-schedule your appointment should you arrive 10 or more minutes late.  We strive to give you quality time with our providers, and arriving late affects you and other patients whose appointments are after yours.  Also, if you no show three or more times for appointments you may be dismissed from the clinic at the providers discretion.     Again, thank you for choosing [facility].  Our hope is that these requests will decrease the amount of time that you wait before being seen by our physicians.       _____________________________________________________________  Should you have questions after your visit to [facility], please contact our office at [phone] between the hours of 8:00 a.m. and 4:30 p.m.  Voicemails left after 4:00 p.m. will not be returned until the following business day.  For prescription refill requests, have your pharmacy contact our office and allow 72 hours.    Cancer Center Support Programs:   > Cancer Support Group  2nd Tuesday of the month 1pm-2pm, Journey Room

## 2021-03-12 NOTE — Progress Notes (Signed)
Donna Munoz, [place] [id number]   CLINIC:  Medical Oncology/Hematology  PCP:  Donna Munoz, No Pcp Per (Inactive) None None   REASON FOR VISIT:  Follow-up for malignant melanoma  PRIOR THERAPY: Resection of right third toe on 01/30/2020 with right third toe amputation at PIP joint on 03/07/2020  NGS Results: BRAF V600E and V600K negative  CURRENT THERAPY: Adjuvant Keytruda every 3 weeks  BRIEF ONCOLOGIC HISTORY:  Oncology History  Melanoma of skin ([place])  12/11/2019 Initial Diagnosis   Melanoma of skin ([facility])   02/19/2020 Genetic Testing   BRAF Mutation Analysis     03/25/2020 -  Chemotherapy    Donna Munoz is on Treatment Plan: MELANOMA PEMBROLIZUMAB Q21D        CANCER STAGING: Cancer Staging Melanoma of skin ([facility]) Staging form: Melanoma of the Skin, AJCC 8th Edition - Clinical stage from 02/08/2020: Stage III (cT2b, cN2a, cM0) - Unsigned   INTERVAL HISTORY:  [name], a 45 y.o. female, returns for routine follow-up and consideration for next cycle of immunotherapy. Donna Munoz was last seen on 01/21/2021.  Due for cycle #15 of Keytruda today.   Overall, she tells me she has been feeling pretty well. She complains of having skin rash, itching, nausea, retching and diarrhea for the past 2 weeks. She had a rash on her abdomen and upper back which has mainly resolved; she has tried doing an oat bath which helped slightly. She had watery diarrhea which stopped 3 days ago with nausea and retching while she was sitting on the commode. She denies any of her kids being sick. She continues having her chronic cough. The only new thing she reports is that she is currently remodeling her home and is painting. She has not taken Synthroid for the past 3 weeks.  Overall, she feels ready for next cycle of immunotherapy today.    REVIEW OF SYSTEMS:  Review of Systems  Constitutional: Positive for appetite change (25%) and fatigue.  Respiratory: Positive for  cough (chronic).   Gastrointestinal: Positive for diarrhea (watery diarrhea for multiple days), nausea and vomiting (retching).  Skin: Positive for itching (w/ rash) and rash (on abdomen & upper back).  All other systems reviewed and are negative.   PAST MEDICAL/SURGICAL HISTORY:  Past Medical History:  Diagnosis Date  . Anemia   . Anxiety   . Depression   . GERD (gastroesophageal reflux disease)   . Headache   . Shingles   . Tobacco abuse 05/06/2020   Past Surgical History:  Procedure Laterality Date  . AMPUTATION TOE Right 03/07/2020   Procedure: RIGHT THIRD TOE AMPUTATION;  Surgeon: [name], MD;  Location: [facility];  Service: General;  Laterality: Right;  . BREAST LUMPECTOMY  age 76  . CESAREAN SECTION  01/06/2006  . CESAREAN SECTION  10/15/2016  . DENTAL SURGERY  2019  . INGUINAL HERNIA REPAIR Right 01/30/2020   Procedure: RIGHT INGUINAL HERNIA REPAIR WITH  MESH;  Surgeon: [name], MD;  Location: [facility];  Service: General;  Laterality: Right;  . INSERTION OF MESH Right 01/30/2020   Procedure: INSERTION OF MESH;  Surgeon: [name], MD;  Location: [facility];  Service: General;  Laterality: Right;  . IR CV LINE INJECTION  09/11/2020  . IR IMAGING GUIDED PORT INSERTION  09/12/2020  . IR REMOVAL TUN ACCESS W/ PORT W/O FL MOD SED  09/12/2020  . IRRIGATION AND DEBRIDEMENT ABSCESS Right 03/07/2020   Procedure: Aspiration of Right  Groin Seroma;  Surgeon: [name], MD;  Location: [facility];  Service: General;  Laterality: Right;  . MELANOMA EXCISION WITH SENTINEL LYMPH NODE BIOPSY Right 01/30/2020   Procedure: WIDE LOWER EXCISION RIGHT THIRD TOE MELANOMA EXCISION WITH SENTINEL LYMPH NODE BIOPSY;  Surgeon: [name], MD;  Location: [facility];  Service: General;  Laterality: Right;  . PORTACATH PLACEMENT N/A 03/07/2020   Procedure: INSERTION PORT-A-CATH;  Surgeon: [name], MD;  Location: [facility];  Service: General;   Laterality: Left    SOCIAL HISTORY:  Social History   Socioeconomic History  . Marital status: Legally Separated    Spouse name: Not on file  . Number of children: 2  . Years of education: Not on file  . Highest education level: Not on file  Occupational History  . Occupation: unemployed d/t COVID  Tobacco Use  . Smoking status: Current Every Day Smoker    Packs/day: 1.00  . Smokeless tobacco: Never Used  Vaping Use  . Vaping Use: Never used  Substance and Sexual Activity  . Alcohol use: No  . Drug use: Yes    Types: Marijuana  . Sexual activity: Yes  Other Topics Concern  . Not on file  Social History Narrative  . Not on file   Social Determinants of Health   Financial Resource Strain: Not on file  Food Insecurity: Not on file  Transportation Needs: No Transportation Needs  . Lack of Transportation (Medical): No  . Lack of Transportation (Non-Medical): No  Physical Activity: Inactive  . Days of Exercise per Week: 0 days  . Minutes of Exercise per Session: 0 min  Stress: Not on file  Social Connections: Not on file  Intimate Partner Violence: Not At Risk  . Fear of Current or Ex-Partner: No  . Emotionally Abused: No  . Physically Abused: No  . Sexually Abused: No    FAMILY HISTORY:  Family History  Problem Relation Age of Onset  . Heart disease Mother   . Cancer Mother   . Ovarian cysts Sister   . Healthy Brother   . Cancer Maternal Grandmother   . Heart attack Maternal Grandmother   . Cancer Maternal Grandfather   . Cancer Paternal Grandmother   . Healthy Sister   . Healthy Sister   . Psoriasis Daughter   . Healthy Son     CURRENT MEDICATIONS:  Current Outpatient Medications  Medication Sig Dispense Refill  . gabapentin (NEURONTIN) 100 MG capsule Take by mouth daily.     Marland Kitchen levothyroxine (SYNTHROID) 50 MCG tablet TAKE 1 TABLET BY MOUTH DAILY BEFORE BREAKFAST 30 tablet 1  . lidocaine-prilocaine (EMLA) cream Apply a small amount to port a cath site  (do not rub in) and cover with plastic wrap 1 hour prior to chemotherapy appointments 30 g 3  . ondansetron (ZOFRAN ODT) 4 MG disintegrating tablet Take 1 tablet (4 mg total) by mouth every 8 (eight) hours as needed for nausea or vomiting. 30 tablet 0  . oxyCODONE (OXY IR/ROXICODONE) 5 MG immediate release tablet Take 1 tablet (5 mg total) by mouth every 6 (six) hours as needed for severe pain. 40 tablet 0  . pantoprazole (PROTONIX) 40 MG tablet TAKE 1 TABLET BY MOUTH EVERY DAY 90 tablet 1  . Pembrolizumab (KEYTRUDA IV) Inject into the vein every 21 ( twenty-one) days.    . TRANSDERM-SCOP 1 MG/3DAYS 1 patch every 3 (three) days.     No current facility-administered medications for this visit.  ALLERGIES:  No Known Allergies  PHYSICAL EXAM:  Performance status (ECOG): 0 - Asymptomatic  Vitals:   03/12/21 0814  BP: 101/69  Pulse: 69  Resp: 18  Temp: (!) 97 F (36.1 C)  SpO2: 100%   Wt Readings from Last 3 Encounters:  03/12/21 112 lb 3.2 oz (50.9 kg)  02/11/21 112 lb (50.8 kg)  01/21/21 110 lb 8 oz (50.1 kg)   Physical Exam Vitals reviewed.  Constitutional:      Appearance: Normal appearance.  Cardiovascular:     Rate and Rhythm: Normal rate and regular rhythm.     Pulses: Normal pulses.     Heart sounds: Normal heart sounds.  Pulmonary:     Effort: Pulmonary effort is normal.     Breath sounds: Normal breath sounds.  Chest:  Breasts:     Right: No axillary adenopathy or supraclavicular adenopathy.     Left: No axillary adenopathy or supraclavicular adenopathy.      Comments: Port-a-Cath in L chest Abdominal:     Palpations: Abdomen is soft. There is no hepatomegaly, splenomegaly or mass.     Tenderness: There is no abdominal tenderness.  Musculoskeletal:     Right lower leg: No edema.     Left lower leg: No edema.  Lymphadenopathy:     Upper Body:     Right upper body: No supraclavicular, axillary or pectoral adenopathy.     Left upper body: No  supraclavicular, axillary or pectoral adenopathy.  Skin:    Findings: No erythema or rash.  Neurological:     General: No focal deficit present.     Mental Status: She is alert and oriented to person, place, and time.  Psychiatric:        Mood and Affect: Mood normal.        Behavior: Behavior normal.     LABORATORY DATA:  I have reviewed the labs as listed.  CBC Latest Ref Rng & Units 03/12/2021 02/11/2021 01/21/2021  WBC 4.0 - 10.5 K/uL 5.6 5.6 5.0  Hemoglobin 12.0 - 15.0 g/dL 11.4(L) 11.4(L) 11.4(L)  Hematocrit 36.0 - 46.0 % 36.3 35.4(L) 35.9(L)  Platelets 150 - 400 K/uL 270 247 245   CMP Latest Ref Rng & Units 03/12/2021 02/11/2021 01/21/2021  Glucose 70 - 99 mg/dL 97 117(H) 118(H)  BUN 6 - 20 mg/dL 8 7 9   Creatinine 0.44 - 1.00 mg/dL 0.62 0.63 0.67  Sodium 135 - 145 mmol/L 135 135 138  Potassium 3.5 - 5.1 mmol/L 3.3(L) 3.3(L) 3.3(L)  Chloride 98 - 111 mmol/L 105 106 105  CO2 22 - 32 mmol/L 24 22 24   Calcium 8.9 - 10.3 mg/dL 9.3 9.2 9.5  Total Protein 6.5 - 8.1 g/dL 7.0 6.7 7.2  Total Bilirubin 0.3 - 1.2 mg/dL 0.4 0.6 0.5  Alkaline Phos 38 - 126 U/L 62 57 58  AST 15 - 41 U/L 23 24 23   ALT 0 - 44 U/L 15 14 14     DIAGNOSTIC IMAGING:  I have independently reviewed the scans and discussed with the Donna Munoz. US Breast Limited Uni Left Inc Axilla  Result Date: 02/13/2021 CLINICAL DATA:  45 year old female with a palpable area of concern in the upper-outer left breast. The Donna Munoz reports left nipple flattening which has been present since a prior benign excision when she was 45 years old although she feels as if her left nipple may be slightly more flatter recently. She also reports chronic white/clear left nipple discharge also since time of prior excision at age  17. Donna Munoz has a history of stage III melanoma. EXAM: DIGITAL DIAGNOSTIC BILATERAL MAMMOGRAM WITH TOMOSYNTHESIS AND CAD; ULTRASOUND RIGHT BREAST LIMITED; ULTRASOUND LEFT BREAST LIMITED TECHNIQUE: Bilateral digital diagnostic  mammography and breast tomosynthesis was performed. The images were evaluated with computer-aided detection.; Targeted ultrasound examination of the right breast was performed; Targeted ultrasound examination of the left breast was performed COMPARISON:  None. ACR Breast Density Category c: The breast tissue is heterogeneously dense, which may obscure small masses. FINDINGS: There is an oval circumscribed mass upper-outer right breast measuring approximately 1.2 cm. Spot compression tomograms were performed over the area of tenderness in the far upper outer left breast with no definite abnormality seen. A port is partially visualized in the left axilla. Spot compression tomograms were performed over the central left breast with flattening of the nipple identified however no abnormality seen in the central left breast. Physical examination in region of tenderness in the upper-outer left breast does not reveal any palpable masses. Physical examination of the central left breast reveals mild flattening of the left nipple, however the central left breast is otherwise normal in appearance without underlying palpable masses. The Donna Munoz states her prior benign excision was in the region of her left nipple areolar complex. Targeted ultrasound of the upper-outer left breast was performed. No suspicious masses or abnormality seen in the region of tenderness. Physical examination of retroareolar left breast was performed with no suspicious masses or abnormality seen. A lactiferous sinus versus cyst in the retroareolar left breast measures 0.8 x 0.4 x 0.9 cm. Targeted ultrasound of the upper-outer right breast was performed. There is an oval hypoechoic mass with slight margin irregularity at 10 o'clock 3 cm from nipple measuring 1.5 x 0.6 x 0.9 cm. This is felt to correspond with the mass seen in the upper-outer right breast at mammography. No lymphadenopathy seen in the right axilla. IMPRESSION: 1. Indeterminate 1.5 cm mass  in the right breast the 10 o'clock position. 2. No mammographic or sonographic abnormalities at site of tenderness in the upper-outer left breast. RECOMMENDATION: 1. Recommend ultrasound-guided biopsy of the mass in the right breast at the 10 o'clock position. This will be scheduled for the Donna Munoz. 2. Recommend further management of the left breast area of pain be based on clinical assessment. 3. Chronic left nipple flattening and left nipple discharge. This is considered a benign process given this began nearly 30 years ago after benign excision with the Donna Munoz was 45 years old. If the Donna Munoz experiences spontaneous unilateral bloody or clear nipple discharge from a single duct only, then further evaluation with breast MRI is recommended. I have discussed the findings and recommendations with the Donna Munoz. If applicable, a reminder letter will be sent to the Donna Munoz regarding the next appointment. BI-RADS CATEGORY  4: Suspicious. Electronically Signed   By: Everlean Alstrom M.D.   On: 02/13/2021 15:29   US Breast Limited Uni Right Inc Axilla  Result Date: 02/13/2021 CLINICAL DATA:  45 year old female with a palpable area of concern in the upper-outer left breast. The Donna Munoz reports left nipple flattening which has been present since a prior benign excision when she was 45 years old although she feels as if her left nipple may be slightly more flatter recently. She also reports chronic white/clear left nipple discharge also since time of prior excision at age 68. Donna Munoz has a history of stage III melanoma. EXAM: DIGITAL DIAGNOSTIC BILATERAL MAMMOGRAM WITH TOMOSYNTHESIS AND CAD; ULTRASOUND RIGHT BREAST LIMITED; ULTRASOUND LEFT BREAST LIMITED TECHNIQUE: Bilateral digital  diagnostic mammography and breast tomosynthesis was performed. The images were evaluated with computer-aided detection.; Targeted ultrasound examination of the right breast was performed; Targeted ultrasound examination of the left breast was  performed COMPARISON:  None. ACR Breast Density Category c: The breast tissue is heterogeneously dense, which may obscure small masses. FINDINGS: There is an oval circumscribed mass upper-outer right breast measuring approximately 1.2 cm. Spot compression tomograms were performed over the area of tenderness in the far upper outer left breast with no definite abnormality seen. A port is partially visualized in the left axilla. Spot compression tomograms were performed over the central left breast with flattening of the nipple identified however no abnormality seen in the central left breast. Physical examination in region of tenderness in the upper-outer left breast does not reveal any palpable masses. Physical examination of the central left breast reveals mild flattening of the left nipple, however the central left breast is otherwise normal in appearance without underlying palpable masses. The Donna Munoz states her prior benign excision was in the region of her left nipple areolar complex. Targeted ultrasound of the upper-outer left breast was performed. No suspicious masses or abnormality seen in the region of tenderness. Physical examination of retroareolar left breast was performed with no suspicious masses or abnormality seen. A lactiferous sinus versus cyst in the retroareolar left breast measures 0.8 x 0.4 x 0.9 cm. Targeted ultrasound of the upper-outer right breast was performed. There is an oval hypoechoic mass with slight margin irregularity at 10 o'clock 3 cm from nipple measuring 1.5 x 0.6 x 0.9 cm. This is felt to correspond with the mass seen in the upper-outer right breast at mammography. No lymphadenopathy seen in the right axilla. IMPRESSION: 1. Indeterminate 1.5 cm mass in the right breast the 10 o'clock position. 2. No mammographic or sonographic abnormalities at site of tenderness in the upper-outer left breast. RECOMMENDATION: 1. Recommend ultrasound-guided biopsy of the mass in the right breast  at the 10 o'clock position. This will be scheduled for the Donna Munoz. 2. Recommend further management of the left breast area of pain be based on clinical assessment. 3. Chronic left nipple flattening and left nipple discharge. This is considered a benign process given this began nearly 30 years ago after benign excision with the Donna Munoz was 45 years old. If the Donna Munoz experiences spontaneous unilateral bloody or clear nipple discharge from a single duct only, then further evaluation with breast MRI is recommended. I have discussed the findings and recommendations with the Donna Munoz. If applicable, a reminder letter will be sent to the Donna Munoz regarding the next appointment. BI-RADS CATEGORY  4: Suspicious. Electronically Signed   By: Everlean Alstrom M.D.   On: 02/13/2021 15:29   MM DIAG BREAST TOMO BILATERAL  Result Date: 02/13/2021 CLINICAL DATA:  45 year old female with a palpable area of concern in the upper-outer left breast. The Donna Munoz reports left nipple flattening which has been present since a prior benign excision when she was 45 years old although she feels as if her left nipple may be slightly more flatter recently. She also reports chronic white/clear left nipple discharge also since time of prior excision at age 48. Donna Munoz has a history of stage III melanoma. EXAM: DIGITAL DIAGNOSTIC BILATERAL MAMMOGRAM WITH TOMOSYNTHESIS AND CAD; ULTRASOUND RIGHT BREAST LIMITED; ULTRASOUND LEFT BREAST LIMITED TECHNIQUE: Bilateral digital diagnostic mammography and breast tomosynthesis was performed. The images were evaluated with computer-aided detection.; Targeted ultrasound examination of the right breast was performed; Targeted ultrasound examination of the left breast was  performed COMPARISON:  None. ACR Breast Density Category c: The breast tissue is heterogeneously dense, which may obscure small masses. FINDINGS: There is an oval circumscribed mass upper-outer right breast measuring approximately 1.2 cm. Spot  compression tomograms were performed over the area of tenderness in the far upper outer left breast with no definite abnormality seen. A port is partially visualized in the left axilla. Spot compression tomograms were performed over the central left breast with flattening of the nipple identified however no abnormality seen in the central left breast. Physical examination in region of tenderness in the upper-outer left breast does not reveal any palpable masses. Physical examination of the central left breast reveals mild flattening of the left nipple, however the central left breast is otherwise normal in appearance without underlying palpable masses. The Donna Munoz states her prior benign excision was in the region of her left nipple areolar complex. Targeted ultrasound of the upper-outer left breast was performed. No suspicious masses or abnormality seen in the region of tenderness. Physical examination of retroareolar left breast was performed with no suspicious masses or abnormality seen. A lactiferous sinus versus cyst in the retroareolar left breast measures 0.8 x 0.4 x 0.9 cm. Targeted ultrasound of the upper-outer right breast was performed. There is an oval hypoechoic mass with slight margin irregularity at 10 o'clock 3 cm from nipple measuring 1.5 x 0.6 x 0.9 cm. This is felt to correspond with the mass seen in the upper-outer right breast at mammography. No lymphadenopathy seen in the right axilla. IMPRESSION: 1. Indeterminate 1.5 cm mass in the right breast the 10 o'clock position. 2. No mammographic or sonographic abnormalities at site of tenderness in the upper-outer left breast. RECOMMENDATION: 1. Recommend ultrasound-guided biopsy of the mass in the right breast at the 10 o'clock position. This will be scheduled for the Donna Munoz. 2. Recommend further management of the left breast area of pain be based on clinical assessment. 3. Chronic left nipple flattening and left nipple discharge. This is considered  a benign process given this began nearly 30 years ago after benign excision with the Donna Munoz was 45 years old. If the Donna Munoz experiences spontaneous unilateral bloody or clear nipple discharge from a single duct only, then further evaluation with breast MRI is recommended. I have discussed the findings and recommendations with the Donna Munoz. If applicable, a reminder letter will be sent to the Donna Munoz regarding the next appointment. BI-RADS CATEGORY  4: Suspicious. Electronically Signed   By: Everlean Alstrom M.D.   On: 02/13/2021 15:29   MM CLIP PLACEMENT RIGHT  Result Date: 02/18/2021 CLINICAL DATA:  Post ultrasound-guided biopsy of a mass in the right breast the 10 o'clock position. EXAM: DIAGNOSTIC RIGHT MAMMOGRAM POST ULTRASOUND BIOPSY COMPARISON:  Previous exams. FINDINGS: Mammographic images were obtained following ultrasound guided biopsy of a mass in the right breast the 10 o'clock position. A ribbon shaped biopsy marking clip is present at the site of biopsied mass in the right breast at the 10 o'clock position. IMPRESSION: Ribbon shaped biopsy marking clip at site of biopsied mass in the right breast at the 10 o'clock position. Final Assessment: Post Procedure Mammograms for Marker Placement Electronically Signed   By: Everlean Alstrom M.D.   On: 02/18/2021 08:44   [place] RT BREAST BX W LOC DEV 1ST LESION IMG BX SPEC US GUIDE  Addendum Date: 03/04/2021   ADDENDUM REPORT: 02/21/2021 12:30 ADDENDUM: PATHOLOGY revealed: A. BREAST, 10:00, MASS, RIGHT, BIOPSY: - Fibroadenoma. Pathology results are CONCORDANT with imaging findings, per Dr.  Everlean Alstrom. Pathology results and recommendations below were discussed with Donna Munoz by telephone on 02/19/2021. Donna Munoz reported biopsy site within normal limits with slight tenderness at the site, and no significant bruising. Post biopsy care instructions were reviewed, questions were answered and my direct phone number was provided to Donna Munoz. Donna Munoz was instructed  to call [facility] Mammography Department if any concerns or questions arise related to the biopsy. Recommendation: Resume annual bilateral screening mammogram due April 2023. Pathology results reported by Electa Sniff RN on 02/21/2021. Electronically Signed   By: Everlean Alstrom M.D.   On: 02/21/2021 12:30   Result Date: 03/04/2021 CLINICAL DATA:  45 year old female with an indeterminate mass in the right breast at the 10 o'clock position. EXAM: ULTRASOUND GUIDED RIGHT BREAST CORE NEEDLE BIOPSY COMPARISON:  Previous exam(s). PROCEDURE: I met with the Donna Munoz and we discussed the procedure of ultrasound-guided biopsy, including benefits and alternatives. We discussed the high likelihood of a successful procedure. We discussed the risks of the procedure, including infection, bleeding, tissue injury, clip migration, and inadequate sampling. Informed written consent was given. The usual time-out protocol was performed immediately prior to the procedure. Lesion quadrant: Upper-outer Using sterile technique and 1% Lidocaine as local anesthetic, under direct ultrasound visualization, a 14 gauge spring-loaded device was used to perform biopsy of the mass in the right breast at the 10 o'clock position using a lateral to medial approach. At the conclusion of the procedure a ribbon shaped tissue marker clip was deployed into the biopsy cavity. Follow up 2 view mammogram was performed and dictated separately. IMPRESSION: Ultrasound guided biopsy of the mass in the right breast at the 10 o'clock position. No apparent complications. Electronically Signed: By: Everlean Alstrom M.D. On: 02/18/2021 08:34     ASSESSMENT:  1. 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 -She underwent right third toe amputation at  PIP level on 03/07/2020 for positive margins. Pathology did not show any residual melanoma. -CT chest and abdomen on 03/21/2020 did not show any evidence of metastatic disease. -Adjuvant immunotherapy with pembrolizumab 1 year recommended. -Pembrolizumab started on 03/25/2020. -CT CAP on 06/12/2020 did not show any evidence of metastatic disease in the chest, abdomen or pelvis. Mild emphysematous changes and respiratory bronchiolitis. -CT right foot with contrast on 06/13/2020 shows no evidence of osteomyelitis, abscess, septic arthritis or myositis. -MRI of the brain on 08/19/2020 did not show any evidence of metastatic disease. -CT CAP on 09/25/2020 did not show any evidence of metastatic disease.  2. Family history: -Mother died of throat cancer. Maternal grandmother had breast cancer. Maternal aunt had pancreatic cancer. Maternal grandfather had bone cancer. Maternal great grandmother died of melanoma. -She will benefit from genetic testing.  3. Right thyroid nodule: -PET scan on 12/20/2019 showed uptake in the right thyroid. -Ultrasound on 02/05/2020 did not show any suspicious nodules. -FNA on 05/01/2020 showed benign follicular nodule, Bethesda category 2.   PLAN:  1. 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  Although symptoms subsided 3 days back. - We reviewed her labs today which showed normal LFTs.  Potassium was minimally low at 3.3.  CBC was normal. - I do believe her symptoms are related to stomach  bug rather than immunotherapy related as they have subsided without intervention. - She may proceed with her treatment today.  RTC 3 weeks for her last treatment.   2. Microcytic anemia: -Hemoglobin today is 11.4 with MCV 88.5.  No Feraheme needed.  3.Hypothyroidism: -She has not been taking her Synthroid for the last 3 weeks due to GI problems.   Today had TSH increased to 8.3.  She was told to start back on Synthroid 50 mcg daily.   Orders placed this encounter:  No orders of the defined types were placed in this encounter.    Derek Jack, MD [place] [phone]   I, Milinda Antis, am acting as a scribe for Dr. Sanda Linger.  I, Derek Jack MD, have reviewed the above documentation for accuracy and completeness, and I agree with the above.

## 2021-03-12 NOTE — Patient Instructions (Signed)
[facility]  Discharge Instructions: Thank you for choosing [facility] to provide your oncology and hematology care.  If you have a lab appointment with the [facility], please come in thru the Main Entrance and check in at the main information desk.  Wear comfortable clothing and clothing appropriate for easy access to any Portacath or PICC line.   We strive to give you quality time with your provider. You may need to reschedule your appointment if you arrive late (15 or more minutes).  Arriving late affects you and other patients whose appointments are after yours.  Also, if you miss three or more appointments without notifying the office, you may be dismissed from the clinic at the provider's discretion.      For prescription refill requests, have your pharmacy contact our office and allow 72 hours for refills to be completed.    Today you received the following  immunotherapy agent keytruda.    To help prevent nausea and vomiting after your treatment, we encourage you to take your nausea medication as directed.  BELOW ARE SYMPTOMS THAT SHOULD BE REPORTED IMMEDIATELY: . *FEVER GREATER THAN 100.4 F (38 C) OR HIGHER . *CHILLS OR SWEATING . *NAUSEA AND VOMITING THAT IS NOT CONTROLLED WITH YOUR NAUSEA MEDICATION . *UNUSUAL SHORTNESS OF BREATH . *UNUSUAL BRUISING OR BLEEDING . *URINARY PROBLEMS (pain or burning when urinating, or frequent urination) . *BOWEL PROBLEMS (unusual diarrhea, constipation, pain near the anus) . TENDERNESS IN MOUTH AND THROAT WITH OR WITHOUT PRESENCE OF ULCERS (sore throat, sores in mouth, or a toothache) . UNUSUAL RASH, SWELLING OR PAIN  . UNUSUAL VAGINAL DISCHARGE OR ITCHING   Items with * indicate a potential emergency and should be followed up as soon as possible or go to the Emergency Department if any problems should occur.  Please show the CHEMOTHERAPY ALERT CARD or IMMUNOTHERAPY ALERT CARD at check-in to the Emergency  Department and triage nurse.  Should you have questions after your visit or need to cancel or reschedule your appointment, please contact [facility] [phone]  and follow the prompts.  Office hours are 8:00 a.m. to 4:30 p.m. Monday - Friday. Please note that voicemails left after 4:00 p.m. may not be returned until the following business day.  We are closed weekends and major holidays. You have access to a nurse at all times for urgent questions. Please call the main number to the clinic [phone] and follow the prompts.  For any non-urgent questions, you may also contact your provider using MyChart. We now offer e-Visits for anyone 23 and older to request care online for non-urgent symptoms. For details visit [email or web address].   Also download the MyChart app! Go to the app store, search "MyChart", open the app, select [facility], and log in with your MyChart username and password.  Due to Covid, a mask is required upon entering the hospital/clinic. If you do not have a mask, one will be given to you upon arrival. For doctor visits, patients may have 1 support person aged 65 or older with them. For treatment visits, patients cannot have anyone with them due to current Covid guidelines and our immunocompromised population.

## 2021-03-12 NOTE — Progress Notes (Signed)
Patient has been evaluated by Dr Delton Coombes.  Labs reviewed and is okay for treatment today.

## 2021-04-01 NOTE — Progress Notes (Signed)
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 on 03/07/2020  NGS Results: BRAF V600E and V600K negative  CURRENT THERAPY: Adjuvant Keytruda every 3 weeks  BRIEF ONCOLOGIC HISTORY:  Oncology History  Melanoma of skin ([place])  12/11/2019 Initial Diagnosis   Melanoma of skin ([facility])   02/19/2020 Genetic Testing   BRAF Mutation Analysis     03/25/2020 -  Chemotherapy    Patient is on Treatment Plan: MELANOMA PEMBROLIZUMAB Q21D        CANCER STAGING: Cancer Staging Melanoma of skin ([place]) Staging form: Melanoma of the Skin, AJCC 8th Edition - Clinical stage from 02/08/2020: Stage III (cT2b, cN2a, cM0) - Unsigned   INTERVAL HISTORY:  [name], a 45 y.o. female, returns for routine follow-up and consideration for next cycle of chemotherapy. Christian was last seen on 03/12/2021.  Due for cycle #16 of Keytruda today.   Overall, she tells me she has been feeling pretty well. She reports a rash and dry skin on her abdomen for 1 week following her last appointment. She reports feeling congested, sneezing, and having a cough that produces clear sputum (it produced yellow/green sputum previously). Her appetite is at baseline.  Overall, she feels ready for next cycle of chemo today.   REVIEW OF SYSTEMS:  Review of Systems  Constitutional: Positive for appetite change (25%) and fatigue (50%).  Respiratory: Positive for cough (produce clear sputum).   Gastrointestinal: Positive for nausea.  Skin: Positive for rash.  All other systems reviewed and are negative.   PAST MEDICAL/SURGICAL HISTORY:  Past Medical History:  Diagnosis Date  . Anemia   . Anxiety   . Depression   . GERD (gastroesophageal reflux disease)    . Headache   . Shingles   . Tobacco abuse 05/06/2020   Past Surgical History:  Procedure Laterality Date  . AMPUTATION TOE Right 03/07/2020   Procedure: RIGHT THIRD TOE AMPUTATION;  Surgeon: [name], MD;  Location: [facility];  Service: General;  Laterality: Right;  . BREAST LUMPECTOMY  age 68  . CESAREAN SECTION  01/06/2006  . CESAREAN SECTION  10/15/2016  . DENTAL SURGERY  2019  . INGUINAL HERNIA REPAIR Right 01/30/2020   Procedure: RIGHT INGUINAL HERNIA REPAIR WITH  MESH;  Surgeon: [name], MD;  Location: [facility];  Service: General;  Laterality: Right;  . INSERTION OF MESH Right 01/30/2020   Procedure: INSERTION OF MESH;  Surgeon: [name], MD;  Location: [facility];  Service: General;  Laterality: Right;  . IR CV LINE INJECTION  09/11/2020  . IR IMAGING GUIDED PORT INSERTION  09/12/2020  . IR REMOVAL TUN ACCESS W/ PORT W/O FL MOD SED  09/12/2020  . IRRIGATION AND DEBRIDEMENT ABSCESS Right 03/07/2020   Procedure: Aspiration of Right Groin Seroma;  Surgeon: [name], MD;  Location: [facility];  Service: General;  Laterality: Right;  . MELANOMA EXCISION WITH SENTINEL LYMPH NODE BIOPSY Right 01/30/2020   Procedure: WIDE LOWER EXCISION RIGHT THIRD TOE MELANOMA EXCISION WITH SENTINEL LYMPH NODE BIOPSY;  Surgeon: [name], MD;  Location: [facility];  Service: General;  Laterality: Right;  . PORTACATH PLACEMENT N/A 03/07/2020   Procedure: INSERTION PORT-A-CATH;  Surgeon: [name], MD;  [facility];  Service: General;  Laterality: Left    SOCIAL HISTORY:  Social History   Socioeconomic History  . Marital status: Legally Separated    Spouse name: Not on file  . Number of children: 2  . Years of education: Not on file  . Highest education level: Not on file  Occupational History  . Occupation: unemployed d/t COVID  Tobacco Use  . Smoking status: Current Every Day Smoker    Packs/day: 1.00  . Smokeless tobacco: Never  Used  Vaping Use  . Vaping Use: Never used  Substance and Sexual Activity  . Alcohol use: No  . Drug use: Yes    Types: Marijuana  . Sexual activity: Yes  Other Topics Concern  . Not on file  Social History Narrative  . Not on file   Social Determinants of Health   Financial Resource Strain: Not on file  Food Insecurity: Not on file  Transportation Needs: No Transportation Needs  . Lack of Transportation (Medical): No  . Lack of Transportation (Non-Medical): No  Physical Activity: Inactive  . Days of Exercise per Week: 0 days  . Minutes of Exercise per Session: 0 min  Stress: Not on file  Social Connections: Not on file  Intimate Partner Violence: Not At Risk  . Fear of Current or Ex-Partner: No  . Emotionally Abused: No  . Physically Abused: No  . Sexually Abused: No    FAMILY HISTORY:  Family History  Problem Relation Age of Onset  . Heart disease Mother   . Cancer Mother   . Ovarian cysts Sister   . Healthy Brother   . Cancer Maternal Grandmother   . Heart attack Maternal Grandmother   . Cancer Maternal Grandfather   . Cancer Paternal Grandmother   . Healthy Sister   . Healthy Sister   . Psoriasis Daughter   . Healthy Son     CURRENT MEDICATIONS:  Current Outpatient Medications  Medication Sig Dispense Refill  . gabapentin (NEURONTIN) 100 MG capsule Take by mouth daily.     Marland Kitchen levothyroxine (SYNTHROID) 50 MCG tablet TAKE 1 TABLET BY MOUTH DAILY BEFORE BREAKFAST 30 tablet 1  . lidocaine-prilocaine (EMLA) cream Apply a small amount to port a cath site (do not rub in) and cover with plastic wrap 1 hour prior to chemotherapy appointments 30 g 3  . ondansetron (ZOFRAN ODT) 4 MG disintegrating tablet Take 1 tablet (4 mg total) by mouth every 8 (eight) hours as needed for nausea or vomiting. 30 tablet 0  . oxyCODONE (OXY IR/ROXICODONE) 5 MG immediate release tablet Take 1 tablet (5 mg total) by mouth every 6 (six) hours as needed for severe pain. 40 tablet 0  .  pantoprazole (PROTONIX) 40 MG tablet TAKE 1 TABLET BY MOUTH EVERY DAY 90 tablet 1  . Pembrolizumab (KEYTRUDA IV) Inject into the vein every 21 ( twenty-one) days.    . TRANSDERM-SCOP 1 MG/3DAYS 1 patch every 3 (three) days.     No current facility-administered medications for this visit.    ALLERGIES:  No Known Allergies  PHYSICAL EXAM:  Performance status (ECOG): 0 - Asymptomatic  There were no vitals filed for this visit. Wt Readings from Last 3 Encounters:  03/12/21 112 lb 3.2 oz (50.9 kg)  02/11/21 112 lb (50.8 kg)  01/21/21 110 lb 8 oz (50.1 kg)   Physical Exam Vitals reviewed.  Constitutional:      Appearance: Normal appearance.  Cardiovascular:     Rate and Rhythm: Normal  rate and regular rhythm.     Pulses: Normal pulses.     Heart sounds: Normal heart sounds.  Pulmonary:     Effort: Pulmonary effort is normal.     Breath sounds: Normal breath sounds.  Chest:  Breasts:     Right: No axillary adenopathy or supraclavicular adenopathy.     Left: No axillary adenopathy or supraclavicular adenopathy.    Abdominal:     Palpations: Abdomen is soft. There is no hepatomegaly, splenomegaly or mass.     Tenderness: There is no abdominal tenderness.  Musculoskeletal:     Right lower leg: No edema.     Left lower leg: No edema.  Lymphadenopathy:     Cervical: No cervical adenopathy.     Right cervical: No superficial cervical adenopathy.    Left cervical: No superficial cervical adenopathy.     Upper Body:     Right upper body: No supraclavicular, axillary or pectoral adenopathy.     Left upper body: No supraclavicular, axillary or pectoral adenopathy.  Neurological:     General: No focal deficit present.     Mental Status: She is alert and oriented to person, place, and time.  Psychiatric:        Mood and Affect: Mood normal.        Behavior: Behavior normal.     LABORATORY DATA:  I have reviewed the labs as listed.  CBC Latest Ref Rng & Units 03/12/2021 02/11/2021  01/21/2021  WBC 4.0 - 10.5 K/uL 5.6 5.6 5.0  Hemoglobin 12.0 - 15.0 g/dL 11.4(L) 11.4(L) 11.4(L)  Hematocrit 36.0 - 46.0 % 36.3 35.4(L) 35.9(L)  Platelets 150 - 400 K/uL 270 247 245   CMP Latest Ref Rng & Units 03/12/2021 02/11/2021 01/21/2021  Glucose 70 - 99 mg/dL 97 117(H) 118(H)  BUN 6 - 20 mg/dL 8 7 9   Creatinine 0.44 - 1.00 mg/dL 0.62 0.63 0.67  Sodium 135 - 145 mmol/L 135 135 138  Potassium 3.5 - 5.1 mmol/L 3.3(L) 3.3(L) 3.3(L)  Chloride 98 - 111 mmol/L 105 106 105  CO2 22 - 32 mmol/L 24 22 24   Calcium 8.9 - 10.3 mg/dL 9.3 9.2 9.5  Total Protein 6.5 - 8.1 g/dL 7.0 6.7 7.2  Total Bilirubin 0.3 - 1.2 mg/dL 0.4 0.6 0.5  Alkaline Phos 38 - 126 U/L 62 57 58  AST 15 - 41 U/L 23 24 23   ALT 0 - 44 U/L 15 14 14     DIAGNOSTIC IMAGING:  I have independently reviewed the scans and discussed with the patient. No results found.   ASSESSMENT:  1. 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 -She underwent right third toe amputation at PIP level on 03/07/2020 for positive margins. Pathology did not show any residual melanoma. -CT chest and abdomen on 03/21/2020 did not show any evidence of metastatic disease. -Adjuvant immunotherapy with pembrolizumab 1 year recommended. -Pembrolizumab started on 03/25/2020. -CT CAP on 06/12/2020 did not show any evidence of metastatic disease in the chest, abdomen or pelvis. Mild emphysematous changes and respiratory bronchiolitis. -CT right foot with contrast on 06/13/2020 shows no evidence of osteomyelitis, abscess, septic arthritis or myositis. -MRI of the brain on 08/19/2020 did not show any evidence of metastatic disease. -CT CAP on 09/25/2020 did not show any evidence of metastatic disease.  2. Family history: -Mother died of  throat cancer. Maternal grandmother  had breast cancer. Maternal aunt had pancreatic cancer. Maternal grandfather had bone cancer. Maternal great grandmother died of melanoma. -She will benefit from genetic testing.  3. Right thyroid nodule: -PET scan on 12/20/2019 showed uptake in the right thyroid. -Ultrasound on 02/05/2020 did not show any suspicious nodules. -FNA on 05/01/2020 showed benign follicular nodule, Bethesda category 2.   PLAN:  1. 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 - Reviewed her labs today which showed normal LFTs.  CBC was grossly normal. - She will proceed with her last treatment today. - RTC 4 weeks with CT CAP and labs.   2. Microcytic anemia: -Hemoglobin today is 11.8 and MCV 88.9.  No Feraheme needed.  3.Hypothyroidism: -She has missed a few doses of Synthroid at least once per week. - Last TSH on 03/12/2021 was 8.3. - We will continue same dose of Synthroid and reevaluate in 4 weeks.   Orders placed this encounter:  No orders of the defined types were placed in this encounter.    Derek Jack, MD [facility] [phone]   I, Thana Ates, am acting as a scribe for Dr. Derek Jack.  I, Derek Jack MD, have reviewed the above documentation for accuracy and completeness, and I agree with the above.

## 2021-04-02 ENCOUNTER — Inpatient Hospital Stay (HOSPITAL_BASED_OUTPATIENT_CLINIC_OR_DEPARTMENT_OTHER): Payer: Medicaid Other | Admitting: Hematology

## 2021-04-02 ENCOUNTER — Inpatient Hospital Stay (HOSPITAL_COMMUNITY): Payer: Medicaid Other

## 2021-04-02 ENCOUNTER — Other Ambulatory Visit (HOSPITAL_COMMUNITY): Payer: Medicaid Other

## 2021-04-02 ENCOUNTER — Other Ambulatory Visit: Payer: Self-pay

## 2021-04-02 VITALS — BP 109/71 | HR 61 | Temp 97.1°F | Resp 18

## 2021-04-02 VITALS — BP 98/66 | HR 63 | Temp 96.9°F | Resp 18 | Wt 111.8 lb

## 2021-04-02 DIAGNOSIS — C439 Malignant melanoma of skin, unspecified: Secondary | ICD-10-CM

## 2021-04-02 DIAGNOSIS — Z5112 Encounter for antineoplastic immunotherapy: Secondary | ICD-10-CM | POA: Diagnosis not present

## 2021-04-02 LAB — CBC WITH DIFFERENTIAL/PLATELET
Abs Immature Granulocytes: 0.01 10*3/uL (ref 0.00–0.07)
Basophils Absolute: 0 10*3/uL (ref 0.0–0.1)
Basophils Relative: 1 %
Eosinophils Absolute: 0.2 10*3/uL (ref 0.0–0.5)
Eosinophils Relative: 5 %
HCT: 38.5 % (ref 36.0–46.0)
Hemoglobin: 11.8 g/dL — ABNORMAL LOW (ref 12.0–15.0)
Immature Granulocytes: 0 %
Lymphocytes Relative: 38 %
Lymphs Abs: 2 10*3/uL (ref 0.7–4.0)
MCH: 27.3 pg (ref 26.0–34.0)
MCHC: 30.6 g/dL (ref 30.0–36.0)
MCV: 88.9 fL (ref 80.0–100.0)
Monocytes Absolute: 0.3 10*3/uL (ref 0.1–1.0)
Monocytes Relative: 6 %
Neutro Abs: 2.6 10*3/uL (ref 1.7–7.7)
Neutrophils Relative %: 50 %
Platelets: 259 10*3/uL (ref 150–400)
RBC: 4.33 MIL/uL (ref 3.87–5.11)
RDW: 16.3 % — ABNORMAL HIGH (ref 11.5–15.5)
WBC: 5.2 10*3/uL (ref 4.0–10.5)
nRBC: 0 % (ref 0.0–0.2)

## 2021-04-02 LAB — COMPREHENSIVE METABOLIC PANEL
ALT: 15 U/L (ref 0–44)
AST: 23 U/L (ref 15–41)
Albumin: 3.9 g/dL (ref 3.5–5.0)
Alkaline Phosphatase: 83 U/L (ref 38–126)
Anion gap: 7 (ref 5–15)
BUN: 8 mg/dL (ref 6–20)
CO2: 26 mmol/L (ref 22–32)
Calcium: 10 mg/dL (ref 8.9–10.3)
Chloride: 104 mmol/L (ref 98–111)
Creatinine, Ser: 0.72 mg/dL (ref 0.44–1.00)
GFR, Estimated: >60 mL/min (ref >60)
Glucose, Bld: 109 mg/dL — ABNORMAL HIGH (ref 70–99)
Potassium: 3.1 mmol/L — ABNORMAL LOW (ref 3.5–5.1)
Sodium: 137 mmol/L (ref 135–145)
Total Bilirubin: 0.3 mg/dL (ref 0.3–1.2)
Total Protein: 7.2 g/dL (ref 6.5–8.1)

## 2021-04-02 MED ORDER — HEPARIN SOD (PORK) LOCK FLUSH 100 UNIT/ML IV SOLN
500.0000 [IU] | Freq: Once | INTRAVENOUS | Status: AC | PRN
  Administered 2021-04-02: 500 [IU]

## 2021-04-02 MED ORDER — POTASSIUM CHLORIDE CRYS ER 20 MEQ PO TBCR: 20.0000 meq | EXTENDED_RELEASE_TABLET | Freq: Once | ORAL | 3 refills | Status: DC

## 2021-04-02 MED ORDER — SODIUM CHLORIDE 0.9 % IV SOLN
200.0000 mg | Freq: Once | INTRAVENOUS | Status: AC
  Administered 2021-04-02: 200 mg via INTRAVENOUS
  Filled 2021-04-02: qty 8

## 2021-04-02 MED ORDER — SODIUM CHLORIDE 0.9% FLUSH
10.0000 mL | INTRAVENOUS | Status: DC | PRN
  Administered 2021-04-02: 10 mL

## 2021-04-02 MED ORDER — POTASSIUM CHLORIDE CRYS ER 20 MEQ PO TBCR
20.0000 meq | EXTENDED_RELEASE_TABLET | Freq: Once | ORAL | Status: AC
  Administered 2021-04-02: 20 meq via ORAL

## 2021-04-02 MED ORDER — AMOXICILLIN-POT CLAVULANATE 875-125 MG PO TABS: 1.0000 | ORAL_TABLET | Freq: Two times a day (BID) | ORAL | 0 refills | Status: DC

## 2021-04-02 MED ORDER — POTASSIUM CHLORIDE CRYS ER 20 MEQ PO TBCR
EXTENDED_RELEASE_TABLET | ORAL | Status: AC
  Filled 2021-04-02: qty 1

## 2021-04-02 MED ORDER — SODIUM CHLORIDE 0.9 % IV SOLN: Freq: Once | INTRAVENOUS | Status: AC

## 2021-04-02 NOTE — Patient Instructions (Signed)
[facility] at [facility] Discharge Instructions  You were seen today by Dr. Delton Coombes. He went over your recent results. You will be scheduled for a CT scan of your abdomen and chest prior to your next visit. Dr. Delton Coombes will see you back in 1 month for labs and follow up.   Thank you for choosing [facility] at [facility] to provide your oncology and hematology care.  To afford each patient quality time with our provider, please arrive at least 15 minutes before your scheduled appointment time.   If you have a lab appointment with the [facility] please come in thru the Main Entrance and check in at the main information desk  You need to re-schedule your appointment should you arrive 10 or more minutes late.  We strive to give you quality time with our providers, and arriving late affects you and other patients whose appointments are after yours.  Also, if you no show three or more times for appointments you may be dismissed from the clinic at the providers discretion.     Again, thank you for choosing [facility].  Our hope is that these requests will decrease the amount of time that you wait before being seen by our physicians.       _____________________________________________________________  Should you have questions after your visit to [facility], please contact our office at [phone] between the hours of 8:00 a.m. and 4:30 p.m.  Voicemails left after 4:00 p.m. will not be returned until the following business day.  For prescription refill requests, have your pharmacy contact our office and allow 72 hours.    Cancer Center Support Programs:   > Cancer Support Group  2nd Tuesday of the month 1pm-2pm, Journey Room

## 2021-04-02 NOTE — Progress Notes (Signed)
Patient presents today for treatment and follow up visit with Dr. Delton Coombes. Labs within parameters for treatment. Potassium today 3.1. Vital signs within parameters for treatment.   Message received from Dr. Delton Coombes via instant chat proceed with treatment, give 20 mEq of Potassium PO once today.   Treatment given today per MD orders. Tolerated infusion without adverse affects. Vital signs stable. No complaints at this time. Discharged from clinic ambulatory in stable condition. Alert and oriented x 3. F/U with [facility] as scheduled.

## 2021-04-02 NOTE — Patient Instructions (Signed)
[facility]  Discharge Instructions: Thank you for choosing [facility] to provide your oncology and hematology care.  If you have a lab appointment with the Cancer Center, please come in thru the Main Entrance and check in at the main information desk.  Wear comfortable clothing and clothing appropriate for easy access to any Portacath or PICC line.   We strive to give you quality time with your provider. You may need to reschedule your appointment if you arrive late (15 or more minutes).  Arriving late affects you and other patients whose appointments are after yours.  Also, if you miss three or more appointments without notifying the office, you may be dismissed from the clinic at the provider's discretion.      For prescription refill requests, have your pharmacy contact our office and allow 72 hours for refills to be completed.    Today you received the following chemotherapy and/or immunotherapy agents Keytruda       To help prevent nausea and vomiting after your treatment, we encourage you to take your nausea medication as directed.  BELOW ARE SYMPTOMS THAT SHOULD BE REPORTED IMMEDIATELY: *FEVER GREATER THAN 100.4 F (38 C) OR HIGHER *CHILLS OR SWEATING *NAUSEA AND VOMITING THAT IS NOT CONTROLLED WITH YOUR NAUSEA MEDICATION *UNUSUAL SHORTNESS OF BREATH *UNUSUAL BRUISING OR BLEEDING *URINARY PROBLEMS (pain or burning when urinating, or frequent urination) *BOWEL PROBLEMS (unusual diarrhea, constipation, pain near the anus) TENDERNESS IN MOUTH AND THROAT WITH OR WITHOUT PRESENCE OF ULCERS (sore throat, sores in mouth, or a toothache) UNUSUAL RASH, SWELLING OR PAIN  UNUSUAL VAGINAL DISCHARGE OR ITCHING   Items with * indicate a potential emergency and should be followed up as soon as possible or go to the Emergency Department if any problems should occur.  Please show the CHEMOTHERAPY ALERT CARD or IMMUNOTHERAPY ALERT CARD at check-in to the Emergency  Department and triage nurse.  Should you have questions after your visit or need to cancel or reschedule your appointment, please contact [facility] [phone]  and follow the prompts.  Office hours are 8:00 a.m. to 4:30 p.m. Monday - Friday. Please note that voicemails left after 4:00 p.m. may not be returned until the following business day.  We are closed weekends and major holidays. You have access to a nurse at all times for urgent questions. Please call the main number to the clinic [phone] and follow the prompts.  For any non-urgent questions, you may also contact your provider using MyChart. We now offer e-Visits for anyone 18 and older to request care online for non-urgent symptoms. For details visit [email or web address].   Also download the MyChart app! Go to the app store, search "MyChart", open the app, select [facility], and log in with your MyChart username and password.  Due to Covid, a mask is required upon entering the hospital/clinic. If you do not have a mask, one will be given to you upon arrival. For doctor visits, patients may have 1 support person aged 18 or older with them. For treatment visits, patients cannot have anyone with them due to current Covid guidelines and our immunocompromised population.  

## 2021-04-07 ENCOUNTER — Encounter (HOSPITAL_COMMUNITY): Payer: Self-pay | Admitting: Hematology

## 2021-04-28 ENCOUNTER — Ambulatory Visit (HOSPITAL_COMMUNITY)
Admission: RE | Admit: 2021-04-28 | Discharge: 2021-04-28 | Disposition: A | Discharge status: Home / Self Care | Payer: Medicaid Other | Source: Ambulatory Visit | Attending: Hematology | Admitting: Hematology

## 2021-04-28 ENCOUNTER — Other Ambulatory Visit: Payer: Self-pay

## 2021-04-28 ENCOUNTER — Inpatient Hospital Stay (HOSPITAL_COMMUNITY): Payer: Medicaid Other | Attending: Hematology

## 2021-04-28 DIAGNOSIS — C439 Malignant melanoma of skin, unspecified: Secondary | ICD-10-CM

## 2021-04-28 DIAGNOSIS — J432 Centrilobular emphysema: Secondary | ICD-10-CM | POA: Diagnosis not present

## 2021-04-28 DIAGNOSIS — N83202 Unspecified ovarian cyst, left side: Secondary | ICD-10-CM | POA: Diagnosis not present

## 2021-04-28 DIAGNOSIS — N200 Calculus of kidney: Secondary | ICD-10-CM | POA: Diagnosis not present

## 2021-04-28 DIAGNOSIS — I700 Atherosclerosis of aorta: Secondary | ICD-10-CM | POA: Diagnosis not present

## 2021-04-28 DIAGNOSIS — J929 Pleural plaque without asbestos: Secondary | ICD-10-CM | POA: Diagnosis not present

## 2021-04-28 DIAGNOSIS — E320 Persistent hyperplasia of thymus: Secondary | ICD-10-CM | POA: Diagnosis not present

## 2021-04-28 DIAGNOSIS — Z85820 Personal history of malignant melanoma of skin: Secondary | ICD-10-CM | POA: Insufficient documentation

## 2021-04-28 LAB — CBC WITH DIFFERENTIAL/PLATELET
Abs Immature Granulocytes: 0.02 10*3/uL (ref 0.00–0.07)
Basophils Absolute: 0.1 10*3/uL (ref 0.0–0.1)
Basophils Relative: 1 %
Eosinophils Absolute: 0.2 10*3/uL (ref 0.0–0.5)
Eosinophils Relative: 3 %
HCT: 37.7 % (ref 36.0–46.0)
Hemoglobin: 11.7 g/dL — ABNORMAL LOW (ref 12.0–15.0)
Immature Granulocytes: 0 %
Lymphocytes Relative: 27 %
Lymphs Abs: 1.7 10*3/uL (ref 0.7–4.0)
MCH: 27.5 pg (ref 26.0–34.0)
MCHC: 31 g/dL (ref 30.0–36.0)
MCV: 88.5 fL (ref 80.0–100.0)
Monocytes Absolute: 0.4 10*3/uL (ref 0.1–1.0)
Monocytes Relative: 6 %
Neutro Abs: 4 10*3/uL (ref 1.7–7.7)
Neutrophils Relative %: 63 %
Platelets: 255 10*3/uL (ref 150–400)
RBC: 4.26 MIL/uL (ref 3.87–5.11)
RDW: 16.5 % — ABNORMAL HIGH (ref 11.5–15.5)
WBC: 6.3 10*3/uL (ref 4.0–10.5)
nRBC: 0 % (ref 0.0–0.2)

## 2021-04-28 LAB — COMPREHENSIVE METABOLIC PANEL
ALT: 18 U/L (ref 0–44)
AST: 24 U/L (ref 15–41)
Albumin: 4.2 g/dL (ref 3.5–5.0)
Alkaline Phosphatase: 78 U/L (ref 38–126)
Anion gap: 6 (ref 5–15)
BUN: 7 mg/dL (ref 6–20)
CO2: 26 mmol/L (ref 22–32)
Calcium: 9.7 mg/dL (ref 8.9–10.3)
Chloride: 104 mmol/L (ref 98–111)
Creatinine, Ser: 0.62 mg/dL (ref 0.44–1.00)
GFR, Estimated: >60 mL/min (ref >60)
Glucose, Bld: 91 mg/dL (ref 70–99)
Potassium: 3.9 mmol/L (ref 3.5–5.1)
Sodium: 136 mmol/L (ref 135–145)
Total Bilirubin: 0.7 mg/dL (ref 0.3–1.2)
Total Protein: 7.6 g/dL (ref 6.5–8.1)

## 2021-04-28 LAB — MAGNESIUM: Magnesium: 1.9 mg/dL (ref 1.7–2.4)

## 2021-05-01 ENCOUNTER — Ambulatory Visit (HOSPITAL_COMMUNITY): Payer: Medicaid Other | Admitting: Hematology

## 2021-05-16 ENCOUNTER — Telehealth (HOSPITAL_BASED_OUTPATIENT_CLINIC_OR_DEPARTMENT_OTHER): Payer: Medicaid Other | Admitting: Hematology and Oncology

## 2021-05-16 ENCOUNTER — Encounter (HOSPITAL_COMMUNITY): Payer: Self-pay | Admitting: Hematology and Oncology

## 2021-05-16 DIAGNOSIS — E039 Hypothyroidism, unspecified: Secondary | ICD-10-CM

## 2021-05-16 DIAGNOSIS — C4371 Malignant melanoma of right lower limb, including hip: Secondary | ICD-10-CM | POA: Diagnosis not present

## 2021-05-16 NOTE — Progress Notes (Signed)
[facility] [place], [place] [address]   CLINIC:  Medical Oncology/Hematology  PCP:  Patient, No Pcp Per (Inactive) None None   REASON FOR VISIT:  Follow-up for malignant melanoma  PRIOR THERAPY: Resection of right third toe on 01/30/2020 with right third toe amputation at PIP joint on 03/07/2020  NGS Results: BRAF V600E and V600K negative  CURRENT THERAPY: Adjuvant Keytruda every 3 weeks  BRIEF ONCOLOGIC HISTORY:  Oncology History  Melanoma of skin ([place])  12/11/2019 Initial Diagnosis   Melanoma of skin ([place])   02/19/2020 Genetic Testing   BRAF Mutation Analysis     03/25/2020 -  Chemotherapy    Patient is on Treatment Plan: MELANOMA PEMBROLIZUMAB Q21D         CANCER STAGING: Cancer Staging Melanoma of skin ([place]) Staging form: Melanoma of the Skin, AJCC 8th Edition - Clinical stage from 02/08/2020: Stage III (cT2b, cN2a, cM0) - Unsigned   INTERVAL HISTORY:  Donna Munoz, a 45 y.o. female, returns for telephone follow-up.  Initial plan was for a video visit however There was no good signal hence we had converted into telephone visit.  Since her last visit, apparently she has been having a bit more cough hence decided to do it as a virtual visit today. Also had some rash a few days ago after the CT scan which has now resolved.  No other complaints.  She was getting immunotherapy adjuvant for melanoma and most recently had some imaging. Most of her symptoms below are chronic and cough and SOB are likely from COPD.  REVIEW OF SYSTEMS:  Review of Systems  Constitutional:  Positive for appetite change (No appetite) and fatigue (50%).  Respiratory:  Positive for cough (produce clear sputum).   Cardiovascular:  Positive for chest pain (describes it as being uncomfortable, but not new).  Gastrointestinal:  Positive for nausea.  Genitourinary:  Positive for difficulty urinating (overactive bladder, polyuria).   Skin:  Positive for rash.   Neurological:  Positive for headaches and numbness.  Psychiatric/Behavioral:  Positive for sleep disturbance.   All other systems reviewed and are negative.  PAST MEDICAL/SURGICAL HISTORY:  Past Medical History:  Diagnosis Date   Anemia    Anxiety    Depression    GERD (gastroesophageal reflux disease)    Headache    Shingles    Tobacco abuse 05/06/2020   Past Surgical History:  Procedure Laterality Date   AMPUTATION TOE Right 03/07/2020   Procedure: RIGHT THIRD TOE AMPUTATION;  Surgeon: [name], MD;  Location: [facility];  Service: General;  Laterality: Right;   BREAST LUMPECTOMY  age 26   CESAREAN SECTION  01/06/2006   CESAREAN SECTION  10/15/2016   DENTAL SURGERY  2019   INGUINAL HERNIA REPAIR Right 01/30/2020   Procedure: RIGHT INGUINAL HERNIA REPAIR WITH  MESH;  Surgeon: [name], MD;  Location: [facility];  Service: General;  Laterality: Right;   INSERTION OF MESH Right 01/30/2020   Procedure: INSERTION OF MESH;  Surgeon: [name], MD;  Location: [facility];  Service: General;  Laterality: Right;   IR CV LINE INJECTION  09/11/2020   IR IMAGING GUIDED PORT INSERTION  09/12/2020   IR REMOVAL TUN ACCESS W/ PORT W/O FL MOD SED  09/12/2020   IRRIGATION AND DEBRIDEMENT ABSCESS Right 03/07/2020   Procedure: Aspiration of Right Groin Seroma;  Surgeon: [name], MD;  Location: [facility];  Service: General;  Laterality: Right;   MELANOMA EXCISION  WITH SENTINEL LYMPH NODE BIOPSY Right 01/30/2020   Procedure: WIDE LOWER EXCISION RIGHT THIRD TOE MELANOMA EXCISION WITH SENTINEL LYMPH NODE BIOPSY;  Surgeon: [name], MD;  Location: [facility];  Service: General;  Laterality: Right;   PORTACATH PLACEMENT N/A 03/07/2020   Procedure: INSERTION PORT-A-CATH;  Surgeon: [name], MD;  Location[facility];  Service: General;  Laterality: Left    SOCIAL HISTORY:  Social History   Socioeconomic History   Marital status: Legally Separated     Spouse name: Not on file   Number of children: 2   Years of education: Not on file   Highest education level: Not on file  Occupational History   Occupation: unemployed d/t COVID  Tobacco Use   Smoking status: Every Day    Packs/day: 1.00    Pack years: 0.00    Types: Cigarettes   Smokeless tobacco: Never  Vaping Use   Vaping Use: Never used  Substance and Sexual Activity   Alcohol use: No   Drug use: Yes    Types: Marijuana   Sexual activity: Yes  Other Topics Concern   Not on file  Social History Narrative   Not on file   Social Determinants of Health   Financial Resource Strain: Not on file  Food Insecurity: Not on file  Transportation Needs: No Transportation Needs   Lack of Transportation (Medical): No   Lack of Transportation (Non-Medical): No  Physical Activity: Inactive   Days of Exercise per Week: 0 days   Minutes of Exercise per Session: 0 min  Stress: Not on file  Social Connections: Not on file  Intimate Partner Violence: Not At Risk   Fear of Current or Ex-Partner: No   Emotionally Abused: No   Physically Abused: No   Sexually Abused: No    FAMILY HISTORY:  Family History  Problem Relation Age of Onset   Heart disease Mother    Cancer Mother    Ovarian cysts Sister    Healthy Brother    Cancer Maternal Grandmother    Heart attack Maternal Grandmother    Cancer Maternal Grandfather    Cancer Paternal Grandmother    Healthy Sister    Healthy Sister    Psoriasis Daughter    Healthy Son     CURRENT MEDICATIONS:  Current Outpatient Medications  Medication Sig Dispense Refill   amoxicillin-clavulanate (AUGMENTIN) 875-125 MG tablet Take 1 tablet by mouth 2 (two) times daily. 14 tablet 0   gabapentin (NEURONTIN) 100 MG capsule Take by mouth daily.      levothyroxine (SYNTHROID) 50 MCG tablet TAKE 1 TABLET BY MOUTH DAILY BEFORE BREAKFAST 30 tablet 1   lidocaine-prilocaine (EMLA) cream Apply a small amount to port a cath site (do not rub in)  and cover with plastic wrap 1 hour prior to chemotherapy appointments 30 g 3   ondansetron (ZOFRAN ODT) 4 MG disintegrating tablet Take 1 tablet (4 mg total) by mouth every 8 (eight) hours as needed for nausea or vomiting. 30 tablet 0   oxyCODONE (OXY IR/ROXICODONE) 5 MG immediate release tablet Take 1 tablet (5 mg total) by mouth every 6 (six) hours as needed for severe pain. 40 tablet 0   pantoprazole (PROTONIX) 40 MG tablet TAKE 1 TABLET BY MOUTH EVERY DAY 90 tablet 1   Pembrolizumab (KEYTRUDA IV) Inject into the vein every 21 ( twenty-one) days.     TRANSDERM-SCOP 1 MG/3DAYS 1 patch every 3 (three) days.     potassium chloride SA (KLOR-CON)  20 MEQ tablet Take 1 tablet (20 mEq total) by mouth once for 1 dose. 30 tablet 3   No current facility-administered medications for this visit.    ALLERGIES:  No Known Allergies  PHYSICAL EXAM:  Performance status (ECOG): 0 - Asymptomatic  There were no vitals filed for this visit. Wt Readings from Last 3 Encounters:  04/02/21 111 lb 12.8 oz (50.7 kg)  03/12/21 112 lb 3.2 oz (50.9 kg)  02/11/21 112 lb (50.8 kg)   PE not done, tele visit, I briefly could see her on video, she was in no distress,   LABORATORY DATA:  I have reviewed the labs as listed.  CBC Latest Ref Rng & Units 04/28/2021 04/02/2021 03/12/2021  WBC 4.0 - 10.5 K/uL 6.3 5.2 5.6  Hemoglobin 12.0 - 15.0 g/dL 11.7(L) 11.8(L) 11.4(L)  Hematocrit 36.0 - 46.0 % 37.7 38.5 36.3  Platelets 150 - 400 K/uL 255 259 270   CMP Latest Ref Rng & Units 04/28/2021 04/02/2021 03/12/2021  Glucose 70 - 99 mg/dL 91 109(H) 97  BUN 6 - 20 mg/dL 7 8 8   Creatinine 0.44 - 1.00 mg/dL 0.62 0.72 0.62  Sodium 135 - 145 mmol/L 136 137 135  Potassium 3.5 - 5.1 mmol/L 3.9 3.1(L) 3.3(L)  Chloride 98 - 111 mmol/L 104 104 105  CO2 22 - 32 mmol/L 26 26 24   Calcium 8.9 - 10.3 mg/dL 9.7 10.0 9.3  Total Protein 6.5 - 8.1 g/dL 7.6 7.2 7.0  Total Bilirubin 0.3 - 1.2 mg/dL 0.7 0.3 0.4  Alkaline Phos 38 - 126 U/L 78 83  62  AST 15 - 41 U/L 24 23 23   ALT 0 - 44 U/L 18 15 15     DIAGNOSTIC IMAGING:  I have independently reviewed the scans and discussed with the patient. CT CHEST ABDOMEN PELVIS WO CONTRAST  Result Date: 04/28/2021 CLINICAL DATA:  Melanoma diagnosed in December 2020. Status post assess treatment response. EXAM: CT CHEST, ABDOMEN AND PELVIS WITHOUT CONTRAST TECHNIQUE: Multidetector CT imaging of the chest, abdomen and pelvis was performed following the standard protocol without IV contrast. COMPARISON:  Multiple priors including most recent CT chest abdomen and pelvis January 14, 2021. FINDINGS: CT CHEST FINDINGS Cardiovascular: Left chest Port-A-Cath with tip in the right atrium. Scattered aortic atherosclerosis without aneurysmal dilation. Normal size heart. No significant pericardial effusion/thickening. Mediastinum/Nodes: No discrete thyroid nodules. Similar triangular focus of mixed soft tissue and fat density in the anterior mediastinum, compatible with thymic hyperplasia. No pathologically enlarged mediastinal, hilar or axillary lymph nodes within the limitation of noncontrast examination. Lungs/Pleura: Mild paraseptal and centrilobular emphysema. Mild diffuse bronchial wall thickening. Similar mild biapical pleuroparenchymal scarring. No suspicious pulmonary nodules or masses. No pleural effusion. No pneumothorax. Musculoskeletal: Dental hardware. Right breast biopsy clip. No suspicious chest wall lesions. CT ABDOMEN PELVIS FINDINGS Hepatobiliary: Unremarkable noncontrast appearance of the hepatic parenchyma. Gallbladder is unremarkable. No biliary ductal dilation. Pancreas: Unremarkable noncontrast appearance of the pancreatic parenchyma. Spleen: Normal in size without focal abnormality. Adrenals/Urinary Tract: Bilateral adrenal glands are unremarkable. No hydronephrosis. No contour deforming renal masses. Punctate nonobstructive left lower pole renal calculus. Urinary bladder is grossly unremarkable for  degree of distension. Stomach/Bowel: Radiopaque enteric contrast traverses the splenic flexure. Stomach is grossly unremarkable. No pathologic dilation of small bowel. Appendix is not confidently identified however there is no pericecal inflammation. Terminal ileum is grossly unremarkable. Descending and sigmoid colon are predominantly decompressed limiting evaluation. Vascular/Lymphatic: Aortic atherosclerosis without aneurysmal dilation. No pathologically enlarged abdominal or pelvic lymph nodes within  the limitations of non contrasted examination and relative paucity of intra-abdominal fat. Surgical clips in the right inguinal canal with stable postsurgical changes. Reproductive: Uterus and right adnexa are unremarkable. 3.4 cm left ovarian cyst. No follow-up imaging recommended. Note: This recommendation does not apply to premenarchal patients and to those with increased risk (genetic, family history, elevated tumor markers or other high-risk factors) of ovarian cancer. Reference: JACR 2020 Feb; 17(2):248-254 Other: No abdominopelvic ascites. Musculoskeletal: No acute or significant osseous findings. IMPRESSION: 1. No evidence of metastatic disease within the chest, abdomen, or pelvis. 2. Stable mild thymic hyperplasia. 3. Punctate nonobstructive left lower pole renal calculus. 4. Emphysema and aortic atherosclerosis. Aortic Atherosclerosis (ICD10-I70.0) and Emphysema (ICD10-J43.9). Electronically Signed   By: Dahlia Bailiff MD   On: 04/28/2021 13:26     ASSESSMENT:  1.  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 -She underwent right third toe amputation at PIP level on 03/07/2020 for positive margins.  Pathology did not show any residual melanoma. -CT chest and abdomen on 03/21/2020 did not show any evidence of  metastatic disease. -Adjuvant immunotherapy with pembrolizumab 1 year recommended. -Pembrolizumab started on 03/25/2020. -CT CAP on 06/12/2020 did not show any evidence of metastatic disease in the chest, abdomen or pelvis.  Mild emphysematous changes and respiratory bronchiolitis. -CT right foot with contrast on 06/13/2020 shows no evidence of osteomyelitis, abscess, septic arthritis or myositis. -MRI of the brain on 08/19/2020 did not show any evidence of metastatic disease. -CT CAP on 09/25/2020 did not show any evidence of metastatic disease.   2.  Family history: -Mother died of throat cancer.  Maternal grandmother had breast cancer.  Maternal aunt had pancreatic cancer.  Maternal grandfather had bone cancer.  Maternal great grandmother died of melanoma. -She will benefit from genetic testing.   3.  Right thyroid nodule: -PET scan on 12/20/2019 showed uptake in the right thyroid. -Ultrasound on 02/05/2020 did not show any suspicious nodules. -FNA on 05/01/2020 showed benign follicular nodule, Bethesda category 2.   PLAN:  1.  Stage IIIb (T2B N2A) malignant melanoma of the right third toe, BRAF V6 100 E-: -completed adjuvant immunotherapy. - No new complaints except for cough and some chest discomfort, she is a smoker and has emphysema. - CT imaging with no concern for recurrence. Will order Mri brain since she is complaining of headaches and history of melanoma. Last MRI done in 08/2020,    2.  Normocytic anemia, mild and stable.   3.  Hypothyroidism: -She has missed a few doses of Synthroid at least once per week. - Last TSH on 03/12/2021 was 8.3. - Plan was to repeat TSH but this was not drawn , hence reordered TSH and lipid panel ( pt was worried about atherosclerosis)  4, Discussed about smoking cessation, cholesterol control and maintaining a healthy weight to decrease risk of cardiovascular events.  I connected with  Micael Hampshire on 05/16/21 by a telephone application and verified  that I am speaking with the correct person using two identifiers.   I discussed the limitations of evaluation and management by telemedicine. The patient expressed understanding and agreed to proceed. I spent 25 minutes in the care of this patient including reviewing imaging results, need for considering an MRI brain, smoking cessation and other questions she had about atherosclerosis.   Orders placed  this encounter:  No orders of the defined types were placed in this encounter.  Benay Pike MD

## 2021-05-20 ENCOUNTER — Encounter (HOSPITAL_COMMUNITY): Payer: Self-pay

## 2021-05-24 DIAGNOSIS — Z20822 Contact with and (suspected) exposure to covid-19: Secondary | ICD-10-CM | POA: Diagnosis not present

## 2021-05-24 DIAGNOSIS — R630 Anorexia: Secondary | ICD-10-CM | POA: Diagnosis not present

## 2021-05-24 DIAGNOSIS — J069 Acute upper respiratory infection, unspecified: Secondary | ICD-10-CM | POA: Diagnosis not present

## 2021-05-24 DIAGNOSIS — R519 Headache, unspecified: Secondary | ICD-10-CM | POA: Diagnosis not present

## 2021-06-16 ENCOUNTER — Ambulatory Visit (HOSPITAL_COMMUNITY): Payer: Medicaid Other

## 2021-06-16 ENCOUNTER — Inpatient Hospital Stay (HOSPITAL_COMMUNITY): Payer: Medicaid Other

## 2021-06-17 ENCOUNTER — Other Ambulatory Visit (HOSPITAL_COMMUNITY): Payer: Medicaid Other

## 2021-06-17 ENCOUNTER — Other Ambulatory Visit: Payer: Self-pay

## 2021-06-17 ENCOUNTER — Ambulatory Visit (HOSPITAL_COMMUNITY)
Admission: RE | Admit: 2021-06-17 | Discharge: 2021-06-17 | Disposition: A | Discharge status: Home / Self Care | Payer: Medicaid Other | Source: Ambulatory Visit | Attending: Hematology and Oncology | Admitting: Hematology and Oncology

## 2021-06-17 ENCOUNTER — Inpatient Hospital Stay (HOSPITAL_COMMUNITY): Payer: Medicaid Other | Attending: Hematology

## 2021-06-17 DIAGNOSIS — F419 Anxiety disorder, unspecified: Secondary | ICD-10-CM | POA: Insufficient documentation

## 2021-06-17 DIAGNOSIS — Z79899 Other long term (current) drug therapy: Secondary | ICD-10-CM | POA: Diagnosis not present

## 2021-06-17 DIAGNOSIS — F32A Depression, unspecified: Secondary | ICD-10-CM | POA: Diagnosis not present

## 2021-06-17 DIAGNOSIS — R079 Chest pain, unspecified: Secondary | ICD-10-CM | POA: Diagnosis not present

## 2021-06-17 DIAGNOSIS — E039 Hypothyroidism, unspecified: Secondary | ICD-10-CM | POA: Diagnosis not present

## 2021-06-17 DIAGNOSIS — R110 Nausea: Secondary | ICD-10-CM | POA: Diagnosis not present

## 2021-06-17 DIAGNOSIS — F17210 Nicotine dependence, cigarettes, uncomplicated: Secondary | ICD-10-CM | POA: Diagnosis not present

## 2021-06-17 DIAGNOSIS — G479 Sleep disorder, unspecified: Secondary | ICD-10-CM | POA: Insufficient documentation

## 2021-06-17 DIAGNOSIS — R5383 Other fatigue: Secondary | ICD-10-CM | POA: Insufficient documentation

## 2021-06-17 DIAGNOSIS — C439 Malignant melanoma of skin, unspecified: Secondary | ICD-10-CM | POA: Insufficient documentation

## 2021-06-17 DIAGNOSIS — C4371 Malignant melanoma of right lower limb, including hip: Secondary | ICD-10-CM | POA: Insufficient documentation

## 2021-06-17 DIAGNOSIS — Z8616 Personal history of COVID-19: Secondary | ICD-10-CM | POA: Insufficient documentation

## 2021-06-17 LAB — LIPID PANEL
Cholesterol: 144 mg/dL (ref 0–200)
HDL: 29 mg/dL — ABNORMAL LOW (ref >40)
LDL Cholesterol: 98 mg/dL (ref 0–99)
Total CHOL/HDL Ratio: 5 RATIO
Triglycerides: 86 mg/dL (ref <150)
VLDL: 17 mg/dL (ref 0–40)

## 2021-06-17 LAB — COMPREHENSIVE METABOLIC PANEL
ALT: 17 U/L (ref 0–44)
AST: 23 U/L (ref 15–41)
Albumin: 3.7 g/dL (ref 3.5–5.0)
Alkaline Phosphatase: 74 U/L (ref 38–126)
Anion gap: 6 (ref 5–15)
BUN: 6 mg/dL (ref 6–20)
CO2: 25 mmol/L (ref 22–32)
Calcium: 9.2 mg/dL (ref 8.9–10.3)
Chloride: 107 mmol/L (ref 98–111)
Creatinine, Ser: 0.6 mg/dL (ref 0.44–1.00)
GFR, Estimated: >60 mL/min (ref >60)
Glucose, Bld: 97 mg/dL (ref 70–99)
Potassium: 3.9 mmol/L (ref 3.5–5.1)
Sodium: 138 mmol/L (ref 135–145)
Total Bilirubin: 0.3 mg/dL (ref 0.3–1.2)
Total Protein: 6.8 g/dL (ref 6.5–8.1)

## 2021-06-17 LAB — CBC WITH DIFFERENTIAL/PLATELET
Abs Immature Granulocytes: 0.02 10*3/uL (ref 0.00–0.07)
Basophils Absolute: 0.1 10*3/uL (ref 0.0–0.1)
Basophils Relative: 1 %
Eosinophils Absolute: 0.2 10*3/uL (ref 0.0–0.5)
Eosinophils Relative: 3 %
HCT: 32.4 % — ABNORMAL LOW (ref 36.0–46.0)
Hemoglobin: 10.1 g/dL — ABNORMAL LOW (ref 12.0–15.0)
Immature Granulocytes: 0 %
Lymphocytes Relative: 27 %
Lymphs Abs: 1.5 10*3/uL (ref 0.7–4.0)
MCH: 26.9 pg (ref 26.0–34.0)
MCHC: 31.2 g/dL (ref 30.0–36.0)
MCV: 86.4 fL (ref 80.0–100.0)
Monocytes Absolute: 0.5 10*3/uL (ref 0.1–1.0)
Monocytes Relative: 8 %
Neutro Abs: 3.3 10*3/uL (ref 1.7–7.7)
Neutrophils Relative %: 61 %
Platelets: 231 10*3/uL (ref 150–400)
RBC: 3.75 MIL/uL — ABNORMAL LOW (ref 3.87–5.11)
RDW: 16.7 % — ABNORMAL HIGH (ref 11.5–15.5)
WBC: 5.5 10*3/uL (ref 4.0–10.5)
nRBC: 0 % (ref 0.0–0.2)

## 2021-06-17 LAB — TSH: TSH: 3.05 u[IU]/mL (ref 0.350–4.500)

## 2021-06-17 MED ORDER — GADOBUTROL 1 MMOL/ML IV SOLN
5.0000 mL | Freq: Once | INTRAVENOUS | Status: AC | PRN
  Administered 2021-06-17: 5 mL via INTRAVENOUS

## 2021-06-18 ENCOUNTER — Other Ambulatory Visit (HOSPITAL_COMMUNITY): Payer: Medicaid Other

## 2021-06-18 NOTE — Progress Notes (Signed)
Donna Munoz,  [place] [id number]   CLINIC:  Medical Oncology/Hematology  PCP:  Patient, No Pcp Per (Inactive) None None   REASON FOR VISIT:  Follow-up for malignant melanoma  PRIOR THERAPY: Resection of right third toe on 01/30/2020 with right third toe amputation at PIP joint on 03/07/2020  NGS Results:  BRAF V600E and V600K negative  CURRENT THERAPY:  Adjuvant Keytruda every 3 weeks  BRIEF ONCOLOGIC HISTORY:  Oncology History  Melanoma of skin ([place])  12/11/2019 Initial Diagnosis   Melanoma of skin ([place])   02/19/2020 Genetic Testing   BRAF Mutation Analysis     03/25/2020 -  Chemotherapy    Patient is on Treatment Plan: MELANOMA PEMBROLIZUMAB Q21D         CANCER STAGING: Cancer Staging Melanoma of skin ([place]) Staging form: Melanoma of the Skin, AJCC 8th Edition - Clinical stage from 02/08/2020: Stage III (cT2b, cN2a, cM0) - Unsigned   INTERVAL HISTORY:  [name], a 45 y.o. female, returns for routine follow-up of her  malignant melanoma. Aidynn was last seen on 04/02/21.   Today she reports feeling well. She reports good appetite, but she has lost 2 lbs since her last visit. She reports irregular menses; in July she had 2 menses.    REVIEW OF SYSTEMS:  Review of Systems  Constitutional:  Positive for fatigue (60%). Negative for appetite change (80%).  Cardiovascular:  Positive for chest pain (occasional).  Gastrointestinal:  Positive for nausea.  Neurological:  Positive for numbness (tingling in hands and feet).  Psychiatric/Behavioral:  Positive for depression and sleep disturbance (falling asleep). The patient is nervous/anxious.   All other systems reviewed and are negative.  PAST MEDICAL/SURGICAL HISTORY:  Past Medical History:  Diagnosis Date   Anemia    Anxiety    Depression    GERD (gastroesophageal reflux disease)    Headache    Shingles    Tobacco abuse 05/06/2020   Past Surgical History:  Procedure  Laterality Date   AMPUTATION TOE Right 03/07/2020   Procedure: RIGHT THIRD TOE AMPUTATION;  Surgeon: [name], MD;  Location: [facility];  Service: General;  Laterality: Right;   BREAST LUMPECTOMY  age 47   CESAREAN SECTION  01/06/2006   CESAREAN SECTION  10/15/2016   DENTAL SURGERY  2019   INGUINAL HERNIA REPAIR Right 01/30/2020   Procedure: RIGHT INGUINAL HERNIA REPAIR WITH  MESH;  Surgeon: [name], MD;  Location: [facility];  Service: General;  Laterality: Right;   INSERTION OF MESH Right 01/30/2020   Procedure: INSERTION OF MESH;  Surgeon: [name], MD;  Location: [facility];  Service: General;  Laterality: Right;   IR CV LINE INJECTION  09/11/2020   IR IMAGING GUIDED PORT INSERTION  09/12/2020   IR REMOVAL TUN ACCESS W/ PORT W/O FL MOD SED  09/12/2020   IRRIGATION AND DEBRIDEMENT ABSCESS Right 03/07/2020   Procedure: Aspiration of Right Groin Seroma;  Surgeon: [name], MD;  Location: [facility];  Service: General;  Laterality: Right;   MELANOMA EXCISION WITH SENTINEL LYMPH NODE BIOPSY Right 01/30/2020   Procedure: WIDE LOWER EXCISION RIGHT THIRD TOE MELANOMA EXCISION WITH SENTINEL LYMPH NODE BIOPSY;  Surgeon: [name], MD;  Location: [facility];  Service: General;  Laterality: Right;   PORTACATH PLACEMENT N/A 03/07/2020   Procedure: INSERTION PORT-A-CATH;  Surgeon: [name], MD;  Location: [facility];  Service: General;  Laterality: Left    SOCIAL HISTORY:  Social History   Socioeconomic History   Marital status: Legally Separated    Spouse name: Not on file   Number of children: 2   Years of education: Not on file   Highest education level: Not on file  Occupational History   Occupation: unemployed d/t COVID  Tobacco Use   Smoking status: Every Day    Packs/day: 1.00    Types: Cigarettes   Smokeless tobacco: Never  Vaping Use   Vaping Use: Never used  Substance and Sexual Activity   Alcohol use: No   Drug use: Yes     Types: Marijuana   Sexual activity: Yes  Other Topics Concern   Not on file  Social History Narrative   Not on file   Social Determinants of Health   Financial Resource Strain: Not on file  Food Insecurity: Not on file  Transportation Needs: No Transportation Needs   Lack of Transportation (Medical): No   Lack of Transportation (Non-Medical): No  Physical Activity: Inactive   Days of Exercise per Week: 0 days   Minutes of Exercise per Session: 0 min  Stress: Not on file  Social Connections: Not on file  Intimate Partner Violence: Not At Risk   Fear of Current or Ex-Partner: No   Emotionally Abused: No   Physically Abused: No   Sexually Abused: No    FAMILY HISTORY:  Family History  Problem Relation Age of Onset   Heart disease Mother    Cancer Mother    Ovarian cysts Sister    Healthy Brother    Cancer Maternal Grandmother    Heart attack Maternal Grandmother    Cancer Maternal Grandfather    Cancer Paternal Grandmother    Healthy Sister    Healthy Sister    Psoriasis Daughter    Healthy Son     CURRENT MEDICATIONS:  Current Outpatient Medications  Medication Sig Dispense Refill   amoxicillin-clavulanate (AUGMENTIN) 875-125 MG tablet Take 1 tablet by mouth 2 (two) times daily. 14 tablet 0   gabapentin (NEURONTIN) 100 MG capsule Take by mouth daily.      levothyroxine (SYNTHROID) 50 MCG tablet TAKE 1 TABLET BY MOUTH DAILY BEFORE BREAKFAST 30 tablet 1   lidocaine-prilocaine (EMLA) cream Apply a small amount to port a cath site (do not rub in) and cover with plastic wrap 1 hour prior to chemotherapy appointments 30 g 3   ondansetron (ZOFRAN ODT) 4 MG disintegrating tablet Take 1 tablet (4 mg total) by mouth every 8 (eight) hours as needed for nausea or vomiting. 30 tablet 0   oxyCODONE (OXY IR/ROXICODONE) 5 MG immediate release tablet Take 1 tablet (5 mg total) by mouth every 6 (six) hours as needed for severe pain. 40 tablet 0   pantoprazole (PROTONIX) 40 MG tablet  TAKE 1 TABLET BY MOUTH EVERY DAY 90 tablet 1   Pembrolizumab (KEYTRUDA IV) Inject into the vein every 21 ( twenty-one) days.     potassium chloride SA (KLOR-CON) 20 MEQ tablet Take 1 tablet (20 mEq total) by mouth once for 1 dose. 30 tablet 3   TRANSDERM-SCOP 1 MG/3DAYS 1 patch every 3 (three) days.     No current facility-administered medications for this visit.    ALLERGIES:  No Known Allergies  PHYSICAL EXAM:  Performance status (ECOG): 0 - Asymptomatic  There were no vitals filed for this visit. Wt Readings from Last 3 Encounters:  04/02/21 111 lb 12.8 oz (50.7 kg)  03/12/21 112 lb 3.2 oz (50.9 kg)  02/11/21  112 lb (50.8 kg)   Physical Exam Vitals reviewed.  Constitutional:      Appearance: Normal appearance.  Cardiovascular:     Rate and Rhythm: Normal rate and regular rhythm.     Pulses: Normal pulses.     Heart sounds: Normal heart sounds.  Pulmonary:     Effort: Pulmonary effort is normal.     Breath sounds: Normal breath sounds.  Musculoskeletal:     Right lower leg: No edema.     Left lower leg: No edema.  Neurological:     General: No focal deficit present.     Mental Status: She is alert and oriented to person, place, and time.  Psychiatric:        Mood and Affect: Mood normal.        Behavior: Behavior normal.     LABORATORY DATA:  I have reviewed the labs as listed.  CBC Latest Ref Rng & Units 06/17/2021 04/28/2021 04/02/2021  WBC 4.0 - 10.5 K/uL 5.5 6.3 5.2  Hemoglobin 12.0 - 15.0 g/dL 10.1(L) 11.7(L) 11.8(L)  Hematocrit 36.0 - 46.0 % 32.4(L) 37.7 38.5  Platelets 150 - 400 K/uL 231 255 259   CMP Latest Ref Rng & Units 06/17/2021 04/28/2021 04/02/2021  Glucose 70 - 99 mg/dL 97 91 109(H)  BUN 6 - 20 mg/dL 6 7 8   Creatinine 0.44 - 1.00 mg/dL 0.60 0.62 0.72  Sodium 135 - 145 mmol/L 138 136 137  Potassium 3.5 - 5.1 mmol/L 3.9 3.9 3.1(L)  Chloride 98 - 111 mmol/L 107 104 104  CO2 22 - 32 mmol/L 25 26 26   Calcium 8.9 - 10.3 mg/dL 9.2 9.7 10.0  Total Protein  6.5 - 8.1 g/dL 6.8 7.6 7.2  Total Bilirubin 0.3 - 1.2 mg/dL 0.3 0.7 0.3  Alkaline Phos 38 - 126 U/L 74 78 83  AST 15 - 41 U/L 23 24 23   ALT 0 - 44 U/L 17 18 15     DIAGNOSTIC IMAGING:  I have independently reviewed the scans and discussed with the patient. MR Brain W Wo Contrast  Result Date: 06/17/2021 CLINICAL DATA:  Melanoma surveillance EXAM: MRI HEAD WITHOUT AND WITH CONTRAST TECHNIQUE: Multiplanar, multiecho pulse sequences of the brain and surrounding structures were obtained without and with intravenous contrast. CONTRAST:  75m GADAVIST GADOBUTROL 1 MMOL/ML IV SOLN COMPARISON:  Brain MRI 08/19/2020 FINDINGS: Brain: Scattered foci of FLAIR signal abnormality in the subcortical and periventricular white matter are again seen, nonspecific and unchanged. There is no suspicious parenchymal signal abnormality. There is no abnormal enhancement. There is no evidence of acute intracranial hemorrhage, extra-axial fluid collection, or infarct. The ventricles are nonenlarged. No mass lesion is identified. There is no midline shift. Vascular: Normal flow voids. Skull and upper cervical spine: Normal marrow signal. Sinuses/Orbits: There is mild mucosal thickening in the right frontal sinus and ethmoid air cells. The globes and orbits are unremarkable. Other: None. IMPRESSION: No evidence of intracranial metastatic disease. Scattered nonspecific foci of FLAIR signal abnormality in the subcortical and periventricular white matter, unchanged. Electronically Signed   By: PValetta MoleMD   On: 06/17/2021 12:49     ASSESSMENT:  1.  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 -She underwent right third toe amputation at PIP level on 03/07/2020 for positive margins.  Pathology did not show any residual  melanoma. -CT chest  and abdomen on 03/21/2020 did not show any evidence of metastatic disease. -Adjuvant immunotherapy with pembrolizumab 1 year recommended. -Pembrolizumab started on 03/25/2020. -CT CAP on 06/12/2020 did not show any evidence of metastatic disease in the chest, abdomen or pelvis.  Mild emphysematous changes and respiratory bronchiolitis. -CT right foot with contrast on 06/13/2020 shows no evidence of osteomyelitis, abscess, septic arthritis or myositis. -MRI of the brain on 08/19/2020 did not show any evidence of metastatic disease. -CT CAP on 09/25/2020 did not show any evidence of metastatic disease.   2.  Family history: -Mother died of throat cancer.  Maternal grandmother had breast cancer.  Maternal aunt had pancreatic cancer.  Maternal grandfather had bone cancer.  Maternal great grandmother died of melanoma. -She will benefit from genetic testing.   3.  Right thyroid nodule: -PET scan on 12/20/2019 showed uptake in the right thyroid. -Ultrasound on 02/05/2020 did not show any suspicious nodules. -FNA on 05/01/2020 showed benign follicular nodule, Bethesda category 2.  PLAN:  1.  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 - CT CAP on 04/28/2021 did not show any evidence of metastatic disease. - MRI of the brain on 06/17/2021 with no evidence of metastatic disease. - She reportedly had COVID on 05/29/2021 and recovered well. - I have recommended follow-up visit in 4 months with repeat CT CAP, LDH, CMP and CBC.  2.  Normocytic anemia: -Hemoglobin today is 10.1.  She is having irregular menses.  She had twice in the last month.  No other bleeding reported. - We will plan to check ferritin and iron panel at next visit.   3.  Hypothyroidism: - Continue Synthroid 50 mcg daily. - TSH today is 3.05.    Orders placed this encounter:  No orders of the defined types were placed in  this encounter.    Derek Jack, MD [place] [phone]   I, Thana Ates, am acting as a scribe for Dr. Derek Jack.  I, Derek Jack MD, have reviewed the above documentation for accuracy and completeness, and I agree with the above.

## 2021-06-19 ENCOUNTER — Other Ambulatory Visit: Payer: Self-pay

## 2021-06-19 ENCOUNTER — Inpatient Hospital Stay (HOSPITAL_BASED_OUTPATIENT_CLINIC_OR_DEPARTMENT_OTHER): Payer: Medicaid Other | Admitting: Hematology

## 2021-06-19 VITALS — BP 94/62 | HR 76 | Temp 97.4°F | Resp 18 | Wt 109.6 lb

## 2021-06-19 DIAGNOSIS — Z8616 Personal history of COVID-19: Secondary | ICD-10-CM | POA: Diagnosis not present

## 2021-06-19 DIAGNOSIS — R5383 Other fatigue: Secondary | ICD-10-CM | POA: Diagnosis not present

## 2021-06-19 DIAGNOSIS — F32A Depression, unspecified: Secondary | ICD-10-CM | POA: Diagnosis not present

## 2021-06-19 DIAGNOSIS — R079 Chest pain, unspecified: Secondary | ICD-10-CM | POA: Diagnosis not present

## 2021-06-19 DIAGNOSIS — Z79899 Other long term (current) drug therapy: Secondary | ICD-10-CM | POA: Diagnosis not present

## 2021-06-19 DIAGNOSIS — F17210 Nicotine dependence, cigarettes, uncomplicated: Secondary | ICD-10-CM | POA: Diagnosis not present

## 2021-06-19 DIAGNOSIS — G479 Sleep disorder, unspecified: Secondary | ICD-10-CM | POA: Diagnosis not present

## 2021-06-19 DIAGNOSIS — C439 Malignant melanoma of skin, unspecified: Secondary | ICD-10-CM | POA: Diagnosis not present

## 2021-06-19 DIAGNOSIS — R110 Nausea: Secondary | ICD-10-CM | POA: Diagnosis not present

## 2021-06-19 DIAGNOSIS — E039 Hypothyroidism, unspecified: Secondary | ICD-10-CM | POA: Diagnosis not present

## 2021-06-19 DIAGNOSIS — F419 Anxiety disorder, unspecified: Secondary | ICD-10-CM | POA: Diagnosis not present

## 2021-06-19 NOTE — Patient Instructions (Signed)
[facility] at [facility] Discharge Instructions  You were seen today by Dr. Delton Coombes. He went over your recent results. You will be scheduled for a CT scan of your chest, abdomen, and pelvis prior to your next appointment. Dr. Delton Coombes will see you back in 4 months for labs and follow up.   Thank you for choosing [place] at [facility] to provide your oncology and hematology care.  To afford each patient quality time with our provider, please arrive at least 15 minutes before your scheduled appointment time.   If you have a lab appointment with the [facility] please come in thru the Main Entrance and check in at the main information desk  You need to re-schedule your appointment should you arrive 10 or more minutes late.  We strive to give you quality time with our providers, and arriving late affects you and other patients whose appointments are after yours.  Also, if you no show three or more times for appointments you may be dismissed from the clinic at the providers discretion.     Again, thank you for choosing [facility].  Our hope is that these requests will decrease the amount of time that you wait before being seen by our physicians.       _____________________________________________________________  Should you have questions after your visit to [facility], please contact our office at [phone] between the hours of 8:00 a.m. and 4:30 p.m.  Voicemails left after 4:00 p.m. will not be returned until the following business day.  For prescription refill requests, have your pharmacy contact our office and allow 72 hours.    Cancer Center Support Programs:   > Cancer Support Group  2nd Tuesday of the month 1pm-2pm, Journey Room

## 2021-06-22 ENCOUNTER — Encounter (HOSPITAL_COMMUNITY): Payer: Self-pay | Admitting: Hematology

## 2021-08-11 DIAGNOSIS — C4371 Malignant melanoma of right lower limb, including hip: Secondary | ICD-10-CM | POA: Insufficient documentation

## 2021-08-18 ENCOUNTER — Other Ambulatory Visit: Payer: Self-pay | Admitting: Internal Medicine

## 2021-10-10 ENCOUNTER — Encounter (HOSPITAL_COMMUNITY): Payer: Self-pay | Admitting: Hematology

## 2021-10-13 ENCOUNTER — Encounter (HOSPITAL_COMMUNITY): Payer: Self-pay | Admitting: Hematology

## 2021-10-17 ENCOUNTER — Ambulatory Visit (HOSPITAL_COMMUNITY)
Admission: RE | Admit: 2021-10-17 | Discharge: 2021-10-17 | Disposition: A | Discharge status: Home / Self Care | Payer: Medicaid Other | Source: Ambulatory Visit | Attending: Hematology | Admitting: Hematology

## 2021-10-17 ENCOUNTER — Inpatient Hospital Stay (HOSPITAL_COMMUNITY): Payer: Medicaid Other | Attending: Hematology

## 2021-10-17 ENCOUNTER — Other Ambulatory Visit: Payer: Self-pay

## 2021-10-17 DIAGNOSIS — I700 Atherosclerosis of aorta: Secondary | ICD-10-CM | POA: Diagnosis not present

## 2021-10-17 DIAGNOSIS — C439 Malignant melanoma of skin, unspecified: Secondary | ICD-10-CM | POA: Insufficient documentation

## 2021-10-17 DIAGNOSIS — J439 Emphysema, unspecified: Secondary | ICD-10-CM | POA: Diagnosis not present

## 2021-10-17 DIAGNOSIS — Z79899 Other long term (current) drug therapy: Secondary | ICD-10-CM | POA: Insufficient documentation

## 2021-10-17 DIAGNOSIS — N200 Calculus of kidney: Secondary | ICD-10-CM | POA: Diagnosis not present

## 2021-10-17 DIAGNOSIS — C4371 Malignant melanoma of right lower limb, including hip: Secondary | ICD-10-CM | POA: Diagnosis not present

## 2021-10-17 DIAGNOSIS — K769 Liver disease, unspecified: Secondary | ICD-10-CM | POA: Diagnosis not present

## 2021-10-17 DIAGNOSIS — E039 Hypothyroidism, unspecified: Secondary | ICD-10-CM | POA: Insufficient documentation

## 2021-10-17 DIAGNOSIS — D649 Anemia, unspecified: Secondary | ICD-10-CM | POA: Insufficient documentation

## 2021-10-17 LAB — COMPREHENSIVE METABOLIC PANEL
ALT: 9 U/L (ref 0–44)
AST: 20 U/L (ref 15–41)
Albumin: 4.2 g/dL (ref 3.5–5.0)
Alkaline Phosphatase: 83 U/L (ref 38–126)
Anion gap: 4 — ABNORMAL LOW (ref 5–15)
BUN: 9 mg/dL (ref 6–20)
CO2: 27 mmol/L (ref 22–32)
Calcium: 9.9 mg/dL (ref 8.9–10.3)
Chloride: 106 mmol/L (ref 98–111)
Creatinine, Ser: 0.7 mg/dL (ref 0.44–1.00)
GFR, Estimated: >60 mL/min (ref >60)
Glucose, Bld: 95 mg/dL (ref 70–99)
Potassium: 4.1 mmol/L (ref 3.5–5.1)
Sodium: 137 mmol/L (ref 135–145)
Total Bilirubin: 0.3 mg/dL (ref 0.3–1.2)
Total Protein: 7.5 g/dL (ref 6.5–8.1)

## 2021-10-17 LAB — CBC WITH DIFFERENTIAL/PLATELET
Abs Immature Granulocytes: 0.01 10*3/uL (ref 0.00–0.07)
Basophils Absolute: 0 10*3/uL (ref 0.0–0.1)
Basophils Relative: 1 %
Eosinophils Absolute: 0.2 10*3/uL (ref 0.0–0.5)
Eosinophils Relative: 5 %
HCT: 37.1 % (ref 36.0–46.0)
Hemoglobin: 11.2 g/dL — ABNORMAL LOW (ref 12.0–15.0)
Immature Granulocytes: 0 %
Lymphocytes Relative: 37 %
Lymphs Abs: 1.5 10*3/uL (ref 0.7–4.0)
MCH: 25.9 pg — ABNORMAL LOW (ref 26.0–34.0)
MCHC: 30.2 g/dL (ref 30.0–36.0)
MCV: 85.7 fL (ref 80.0–100.0)
Monocytes Absolute: 0.3 10*3/uL (ref 0.1–1.0)
Monocytes Relative: 7 %
Neutro Abs: 2.1 10*3/uL (ref 1.7–7.7)
Neutrophils Relative %: 50 %
Platelets: 246 10*3/uL (ref 150–400)
RBC: 4.33 MIL/uL (ref 3.87–5.11)
RDW: 17.5 % — ABNORMAL HIGH (ref 11.5–15.5)
WBC: 4.1 10*3/uL (ref 4.0–10.5)
nRBC: 0 % (ref 0.0–0.2)

## 2021-10-17 LAB — TSH: TSH: 5.746 u[IU]/mL — ABNORMAL HIGH (ref 0.350–4.500)

## 2021-10-17 LAB — IRON AND TIBC
Iron: 20 ug/dL — ABNORMAL LOW (ref 28–170)
Saturation Ratios: 4 % — ABNORMAL LOW (ref 10.4–31.8)
TIBC: 553 ug/dL — ABNORMAL HIGH (ref 250–450)
UIBC: 533 ug/dL

## 2021-10-17 LAB — FERRITIN: Ferritin: 4 ng/mL — ABNORMAL LOW (ref 11–307)

## 2021-10-17 MED ORDER — IOHEXOL 300 MG/ML  SOLN
75.0000 mL | Freq: Once | INTRAMUSCULAR | Status: AC | PRN
  Administered 2021-10-17: 75 mL via INTRAVENOUS

## 2021-10-22 NOTE — Progress Notes (Signed)
[address], [place] [address]   CLINIC:  Medical Oncology/Hematology  PCP:  Patient, No Pcp Per (Inactive) None None   REASON FOR VISIT:  Follow-up for malignant melanoma  PRIOR THERAPY: Resection of right third toe on 01/30/2020 with right third toe amputation at PIP joint on 03/07/2020  NGS Results: BRAF V600E and V600K negative  CURRENT THERAPY: Adjuvant Keytruda every 3 weeks  BRIEF ONCOLOGIC HISTORY:  Oncology History  Melanoma of skin ([facility])  12/11/2019 Initial Diagnosis   Melanoma of skin ([facility])   02/19/2020 Genetic Testing   BRAF Mutation Analysis     03/25/2020 -  Chemotherapy    Patient is on Treatment Plan: MELANOMA PEMBROLIZUMAB Q21D         CANCER STAGING:  Cancer Staging  Melanoma of skin ([facility]) Staging form: Melanoma of the Skin, AJCC 8th Edition - Clinical stage from 02/08/2020: Stage III (cT2b, cN2a, cM0) - Unsigned   INTERVAL HISTORY:  [name], a 45 y.o. female, returns for routine follow-up of her malignant melanoma. Rayen was last seen on 06/19/2021.   Today she reports feeling fair. She reports severe fatigue. She reports pain in her lower back which is worsened with movement. She denies history of kidney stones, and she denies dysuria. She denies ice pica.   REVIEW OF SYSTEMS:  Review of Systems  Constitutional:  Positive for appetite change (no appetite) and fatigue (depleted).  HENT:   Positive for trouble swallowing (teeth problems).   Respiratory:  Positive for cough (smoker) and shortness of breath (smoker).   Cardiovascular:  Positive for chest pain.  Gastrointestinal:  Positive for diarrhea and nausea.  Genitourinary:  Positive for frequency. Negative for dysuria.   Musculoskeletal:  Positive for back pain (3/10 lower back).  Neurological:  Positive for dizziness, headaches and numbness (fingers).  Psychiatric/Behavioral:  Positive for depression. The patient is nervous/anxious.   All other  systems reviewed and are negative.  PAST MEDICAL/SURGICAL HISTORY:  Past Medical History:  Diagnosis Date   Anemia    Anxiety    Depression    GERD (gastroesophageal reflux disease)    Headache    Shingles    Tobacco abuse 05/06/2020   Past Surgical History:  Procedure Laterality Date   AMPUTATION TOE Right 03/07/2020   Procedure: RIGHT THIRD TOE AMPUTATION;  Surgeon: [name], MD;  Location: [facility];  Service: General;  Laterality: Right;   BREAST LUMPECTOMY  age 58   CESAREAN SECTION  01/06/2006   CESAREAN SECTION  10/15/2016   DENTAL SURGERY  2019   INGUINAL HERNIA REPAIR Right 01/30/2020   Procedure: RIGHT INGUINAL HERNIA REPAIR WITH  MESH;  Surgeon: [name], MD;  Location: [facility];  Service: General;  Laterality: Right;   INSERTION OF MESH Right 01/30/2020   Procedure: INSERTION OF MESH;  Surgeon: [name], MD;  Location: [facility];  Service: General;  Laterality: Right;   IR CV LINE INJECTION  09/11/2020   IR IMAGING GUIDED PORT INSERTION  09/12/2020   IR REMOVAL TUN ACCESS W/ PORT W/O FL MOD SED  09/12/2020   IRRIGATION AND DEBRIDEMENT ABSCESS Right 03/07/2020   Procedure: Aspiration of Right Groin Seroma;  Surgeon: [name], MD;  Location: [facility];  Service: General;  Laterality: Right;   MELANOMA EXCISION WITH SENTINEL LYMPH NODE BIOPSY Right 01/30/2020   Procedure: WIDE LOWER EXCISION RIGHT THIRD TOE MELANOMA EXCISION WITH SENTINEL LYMPH NODE BIOPSY;  Surgeon: [name], MD;  Location: [facility];  Service: General;  Laterality: Right;   PORTACATH PLACEMENT N/A 03/07/2020   Procedure: INSERTION PORT-A-CATH;  Surgeon: [name], MD;  [facility];  Service: General;  Laterality: Left    SOCIAL HISTORY:  Social History   Socioeconomic History   Marital status: Legally Separated    Spouse name: Not on file   Number of children: 2   Years of education: Not on file   Highest education level: Not on  file  Occupational History   Occupation: unemployed d/t COVID  Tobacco Use   Smoking status: Every Day    Packs/day: 1.00    Types: Cigarettes   Smokeless tobacco: Never  Vaping Use   Vaping Use: Never used  Substance and Sexual Activity   Alcohol use: No   Drug use: Yes    Types: Marijuana   Sexual activity: Yes  Other Topics Concern   Not on file  Social History Narrative   Not on file   Social Determinants of Health   Financial Resource Strain: Not on file  Food Insecurity: Not on file  Transportation Needs: No Transportation Needs   Lack of Transportation (Medical): No   Lack of Transportation (Non-Medical): No  Physical Activity: Inactive   Days of Exercise per Week: 0 days   Minutes of Exercise per Session: 0 min  Stress: Not on file  Social Connections: Not on file  Intimate Partner Violence: Not At Risk   Fear of Current or Ex-Partner: No   Emotionally Abused: No   Physically Abused: No   Sexually Abused: No    FAMILY HISTORY:  Family History  Problem Relation Age of Onset   Heart disease Mother    Cancer Mother    Ovarian cysts Sister    Healthy Brother    Cancer Maternal Grandmother    Heart attack Maternal Grandmother    Cancer Maternal Grandfather    Cancer Paternal Grandmother    Healthy Sister    Healthy Sister    Psoriasis Daughter    Healthy Son     CURRENT MEDICATIONS:  Current Outpatient Medications  Medication Sig Dispense Refill   amoxicillin-clavulanate (AUGMENTIN) 875-125 MG tablet Take 1 tablet by mouth 2 (two) times daily. 14 tablet 0   gabapentin (NEURONTIN) 100 MG capsule Take by mouth daily.      ibuprofen (ADVIL) 800 MG tablet Take 800 mg by mouth every 8 (eight) hours.     levothyroxine (SYNTHROID) 25 MCG tablet Take 1 tablet (25 mcg total) by mouth daily before breakfast. 30 tablet 6   lidocaine-prilocaine (EMLA) cream Apply a small amount to port a cath site (do not rub in) and cover with plastic wrap 1 hour prior to  chemotherapy appointments 30 g 3   ondansetron (ZOFRAN ODT) 4 MG disintegrating tablet Take 1 tablet (4 mg total) by mouth every 8 (eight) hours as needed for nausea or vomiting. 30 tablet 0   oxyCODONE (OXY IR/ROXICODONE) 5 MG immediate release tablet Take 1 tablet (5 mg total) by mouth every 6 (six) hours as needed for severe pain. 40 tablet 0   pantoprazole (PROTONIX) 40 MG tablet Take 1 tablet (40 mg total) by mouth daily. Please make overdue appt with Dr. Harrington Challenger before anymore refills. Thank you 1st attempt 30 tablet 0   Pembrolizumab (KEYTRUDA IV) Inject into the vein every 21 ( twenty-one) days.     TRANSDERM-SCOP 1 MG/3DAYS 1 patch every 3 (three) days.     albuterol (VENTOLIN  HFA) 108 (90 Base) MCG/ACT inhaler Inhale into the lungs. (Patient not taking: Reported on 06/19/2021)     potassium chloride SA (KLOR-CON) 20 MEQ tablet Take 1 tablet (20 mEq total) by mouth once for 1 dose. 30 tablet 3   No current facility-administered medications for this visit.    ALLERGIES:  No Known Allergies  PHYSICAL EXAM:  Performance status (ECOG): 0 - Asymptomatic  Vitals:   10/23/21 1119  BP: 97/72  Pulse: (!) 59  Resp: 18  Temp: (!) 97 F (36.1 C)  SpO2: 100%   Wt Readings from Last 3 Encounters:  10/23/21 112 lb 12.8 oz (51.2 kg)  06/19/21 109 lb 9.6 oz (49.7 kg)  04/02/21 111 lb 12.8 oz (50.7 kg)   Physical Exam Vitals reviewed.  Constitutional:      Appearance: Normal appearance.  Cardiovascular:     Rate and Rhythm: Normal rate and regular rhythm.     Pulses: Normal pulses.     Heart sounds: Normal heart sounds.  Pulmonary:     Effort: Pulmonary effort is normal.     Breath sounds: Normal breath sounds.  Neurological:     General: No focal deficit present.     Mental Status: She is alert and oriented to person, place, and time.  Psychiatric:        Mood and Affect: Mood normal.        Behavior: Behavior normal.     LABORATORY DATA:  I have reviewed the labs as listed.   CBC Latest Ref Rng & Units 10/17/2021 06/17/2021 04/28/2021  WBC 4.0 - 10.5 K/uL 4.1 5.5 6.3  Hemoglobin 12.0 - 15.0 g/dL 11.2(L) 10.1(L) 11.7(L)  Hematocrit 36.0 - 46.0 % 37.1 32.4(L) 37.7  Platelets 150 - 400 K/uL 246 231 255   CMP Latest Ref Rng & Units 10/17/2021 06/17/2021 04/28/2021  Glucose 70 - 99 mg/dL 95 97 91  BUN 6 - 20 mg/dL 9 6 7   Creatinine 0.44 - 1.00 mg/dL 0.70 0.60 0.62  Sodium 135 - 145 mmol/L 137 138 136  Potassium 3.5 - 5.1 mmol/L 4.1 3.9 3.9  Chloride 98 - 111 mmol/L 106 107 104  CO2 22 - 32 mmol/L 27 25 26   Calcium 8.9 - 10.3 mg/dL 9.9 9.2 9.7  Total Protein 6.5 - 8.1 g/dL 7.5 6.8 7.6  Total Bilirubin 0.3 - 1.2 mg/dL 0.3 0.3 0.7  Alkaline Phos 38 - 126 U/L 83 74 78  AST 15 - 41 U/L 20 23 24   ALT 0 - 44 U/L 9 17 18     DIAGNOSTIC IMAGING:  I have independently reviewed the scans and discussed with the patient. CT CHEST ABDOMEN PELVIS W CONTRAST  Result Date: 10/20/2021 CLINICAL DATA:  Melanoma of the toe with metastatic disease to the right groin EXAM: CT CHEST, ABDOMEN, AND PELVIS WITH CONTRAST TECHNIQUE: Multidetector CT imaging of the chest, abdomen and pelvis was performed following the standard protocol during bolus administration of intravenous contrast. CONTRAST:  5m OMNIPAQUE IOHEXOL 300 MG/ML  SOLN COMPARISON:  04/28/2021 FINDINGS: CT CHEST FINDINGS Cardiovascular: The heart size is normal. No substantial pericardial effusion. Left-sided Port-A-Cath tip is positioned at the SVC/RA junction. Mediastinum/Nodes: No mediastinal lymphadenopathy. There is no hilar lymphadenopathy. The esophagus has normal imaging features. There is no axillary lymphadenopathy. Lungs/Pleura: Centrilobular emphsyema noted. No suspicious pulmonary nodule or mass. No focal airspace consolidation. There is no evidence of pleural effusion. Musculoskeletal: No worrisome lytic or sclerotic osseous abnormality. CT ABDOMEN PELVIS FINDINGS Hepatobiliary: Adjacent tiny hypodensities in  the central  right liver measuring up to 4 mm are not appreciably changed comparing back to 01/14/2021, likely benign. 4 mm low-density lesion lateral segment left liver on 65/2 also unchanged since 01/14/2021. There is no evidence for gallstones, gallbladder wall thickening, or pericholecystic fluid. No intrahepatic or extrahepatic biliary dilation. Pancreas: No focal mass lesion. No dilatation of the main duct. No intraparenchymal cyst. No peripancreatic edema. Spleen: No splenomegaly. No focal mass lesion. Adrenals/Urinary Tract: No adrenal nodule or mass. Right kidney unremarkable. 3 mm nonobstructing stone noted lower pole left kidney. No evidence for hydroureter. The urinary bladder appears normal for the degree of distention. Stomach/Bowel: Stomach is unremarkable. No gastric wall thickening. No evidence of outlet obstruction. Duodenum is normally positioned as is the ligament of Treitz. No small bowel wall thickening. No small bowel dilatation. The terminal ileum is normal. The appendix is normal. No gross colonic mass. No colonic wall thickening. Vascular/Lymphatic: There is mild atherosclerotic calcification of the abdominal aorta without aneurysm. There is no gastrohepatic or hepatoduodenal ligament lymphadenopathy. No retroperitoneal or mesenteric lymphadenopathy. No pelvic sidewall lymphadenopathy. No groin lymphadenopathy. Surgical clips noted in the right groin region. Reproductive: The uterus is unremarkable.  There is no adnexal mass. Other: No intraperitoneal free fluid. Musculoskeletal: No worrisome lytic or sclerotic osseous abnormality. IMPRESSION: 1. Stable exam. No new or progressive findings to suggest metastatic disease in the chest, abdomen, or pelvis. 2. 3 mm nonobstructing left renal stone. 3. Aortic Atherosclerosis (ICD10-I70.0) and Emphysema (ICD10-J43.9). Electronically Signed   By: Misty Stanley M.D.   On: 10/20/2021 08:30     ASSESSMENT:  1.  Stage IIIb (T2B N2A) malignant melanoma of the  right third toe, BRAF V6 100 E negative: -Resection of the primary with sentinel lymph node biopsy on 01/30/2020, positive margin, pathology with three lymph nodes positive for micrometastasis. -She underwent right third toe amputation at PIP level on 03/07/2020 for positive margins.  Pathology did not show any residual melanoma. -CT chest and abdomen on 03/21/2020 did not show any evidence of metastatic disease. -Adjuvant immunotherapy with pembrolizumab 1 year recommended. -Pembrolizumab started on 03/25/2020. -CT CAP on 06/12/2020 did not show any evidence of metastatic disease in the chest, abdomen or pelvis.  Mild emphysematous changes and respiratory bronchiolitis. -CT right foot with contrast on 06/13/2020 shows no evidence of osteomyelitis, abscess, septic arthritis or myositis. -MRI of the brain on 08/19/2020 did not show any evidence of metastatic disease. -CT CAP on 09/25/2020 did not show any evidence of metastatic disease.   2.  Family history: -Mother died of throat cancer.  Maternal grandmother had breast cancer.  Maternal aunt had pancreatic cancer.  Maternal grandfather had bone cancer.  Maternal great grandmother died of melanoma. -She will benefit from genetic testing.   3.  Right thyroid nodule: -PET scan on 12/20/2019 showed uptake in the right thyroid. -Ultrasound on 02/05/2020 did not show any suspicious nodules. -FNA on 05/01/2020 showed benign follicular nodule, Bethesda category 2.   PLAN:  1.  Stage IIIb (T2B N2A) malignant melanoma of the right third toe, BRAF V6 100 E-: - MRI of the brain on 06/17/2021 with no evidence of metastatic disease. - Reviewed CT CAP from 10/17/2021 which showed no new or progressive findings to suggest metastatic disease.  3 mm nonobstructing left renal stone. - Physical examination did not reveal any positive findings.  She reports occasional right loin pain on activity.  No urinary symptoms or fevers.  Likely musculoskeletal. - RTC 3 months with  repeat  labs.  Plan to repeat imaging of the body and brain in 6 months.   2.  Normocytic anemia: -Her hemoglobin is 11.2 with MCV 85.  Ferritin is 4 and percent saturation of 4. - She is feeling tired. - Recommend weekly Feraheme x2.  We will plan to repeat ferritin and iron panel in 3 months.   3.  Hypothyroidism: - She stopped taking Synthroid few months ago. - TSH today increased to 5.7. - We will start her on Synthroid 25 mcg daily on an empty stomach. - We will plan to repeat TSH in 3 months.   Orders placed this encounter:  No orders of the defined types were placed in this encounter.    Derek Jack, MD [facility] [phone]   I, Thana Ates, am acting as a scribe for Dr. Derek Jack.  I, Derek Jack MD, have reviewed the above documentation for accuracy and completeness, and I agree with the above.

## 2021-10-23 ENCOUNTER — Inpatient Hospital Stay (HOSPITAL_BASED_OUTPATIENT_CLINIC_OR_DEPARTMENT_OTHER): Payer: Medicaid Other | Admitting: Hematology

## 2021-10-23 ENCOUNTER — Other Ambulatory Visit: Payer: Self-pay

## 2021-10-23 ENCOUNTER — Encounter (HOSPITAL_COMMUNITY): Payer: Self-pay | Admitting: Hematology

## 2021-10-23 VITALS — BP 97/72 | HR 59 | Temp 97.0°F | Resp 18 | Ht 62.0 in | Wt 112.8 lb

## 2021-10-23 DIAGNOSIS — C439 Malignant melanoma of skin, unspecified: Secondary | ICD-10-CM | POA: Diagnosis not present

## 2021-10-23 DIAGNOSIS — Z79899 Other long term (current) drug therapy: Secondary | ICD-10-CM | POA: Diagnosis not present

## 2021-10-23 DIAGNOSIS — E032 Hypothyroidism due to medicaments and other exogenous substances: Secondary | ICD-10-CM

## 2021-10-23 DIAGNOSIS — D649 Anemia, unspecified: Secondary | ICD-10-CM | POA: Diagnosis not present

## 2021-10-23 DIAGNOSIS — E039 Hypothyroidism, unspecified: Secondary | ICD-10-CM | POA: Diagnosis not present

## 2021-10-23 MED ORDER — LEVOTHYROXINE SODIUM 25 MCG PO TABS: 25.0000 ug | ORAL_TABLET | Freq: Every day | ORAL | 6 refills | Status: DC

## 2021-10-23 NOTE — Patient Instructions (Signed)
[facility] at [facility] Discharge Instructions   You were seen and examined today by Dr. Delton Coombes.  He reviewed your lab work - your thyroid level is high, which means your thyroid is underactive.  Dr. Raliegh Ip has sent a prescription for your thyroid medication.  You should take once a day on an empty stomach 1 hour before breakfast. Your iron levels were also extremely low.  We will set you up for 2 IV iron transfusions.   Return as scheduled in 3 months for lab work and office visit.     Thank you for choosing [facility] at [facility] to provide your oncology and hematology care.  To afford each patient quality time with our provider, please arrive at least 15 minutes before your scheduled appointment time.   If you have a lab appointment with the [facility] please come in thru the Main Entrance and check in at the main information desk.  You need to re-schedule your appointment should you arrive 10 or more minutes late.  We strive to give you quality time with our providers, and arriving late affects you and other patients whose appointments are after yours.  Also, if you no show three or more times for appointments you may be dismissed from the clinic at the providers discretion.     Again, thank you for choosing [facility].  Our hope is that these requests will decrease the amount of time that you wait before being seen by our physicians.       _____________________________________________________________  Should you have questions after your visit to [facility], please contact our office at [phone] and follow the prompts.  Our office hours are 8:00 a.m. and 4:30 p.m. Monday - Friday.  Please note that voicemails left after 4:00 p.m. may not be returned until the following business day.  We are closed weekends and major holidays.  You do have access to a nurse 24-7, just call the main number to the clinic  [phone] and do not press any options, hold on the line and a nurse will answer the phone.    For prescription refill requests, have your pharmacy contact our office and allow 72 hours.    Due to Covid, you will need to wear a mask upon entering the hospital. If you do not have a mask, a mask will be given to you at the Main Entrance upon arrival. For doctor visits, patients may have 1 support person age 1 or older with them. For treatment visits, patients can not have anyone with them due to social distancing guidelines and our immunocompromised population.

## 2021-10-24 ENCOUNTER — Inpatient Hospital Stay (HOSPITAL_COMMUNITY): Payer: Medicaid Other

## 2021-10-24 ENCOUNTER — Other Ambulatory Visit (HOSPITAL_COMMUNITY): Payer: Self-pay | Admitting: Physician Assistant

## 2021-10-24 VITALS — BP 94/56 | HR 59 | Temp 97.3°F | Resp 18 | Ht 62.0 in | Wt 114.0 lb

## 2021-10-24 DIAGNOSIS — D509 Iron deficiency anemia, unspecified: Secondary | ICD-10-CM

## 2021-10-24 DIAGNOSIS — C439 Malignant melanoma of skin, unspecified: Secondary | ICD-10-CM | POA: Diagnosis not present

## 2021-10-24 MED ORDER — SODIUM CHLORIDE 0.9% FLUSH
10.0000 mL | Freq: Once | INTRAVENOUS | Status: AC | PRN
  Administered 2021-10-24: 10 mL

## 2021-10-24 MED ORDER — SODIUM CHLORIDE 0.9 % IV SOLN: Freq: Once | INTRAVENOUS | Status: AC

## 2021-10-24 MED ORDER — SODIUM CHLORIDE 0.9 % IV SOLN
300.0000 mg | Freq: Once | INTRAVENOUS | Status: AC
  Administered 2021-10-24: 300 mg via INTRAVENOUS
  Filled 2021-10-24: qty 100

## 2021-10-24 MED ORDER — LORATADINE 10 MG PO TABS
10.0000 mg | ORAL_TABLET | Freq: Once | ORAL | Status: AC
  Administered 2021-10-24: 10 mg via ORAL
  Filled 2021-10-24: qty 1

## 2021-10-24 MED ORDER — ACETAMINOPHEN 325 MG PO TABS
650.0000 mg | ORAL_TABLET | Freq: Once | ORAL | Status: AC
  Administered 2021-10-24: 650 mg via ORAL
  Filled 2021-10-24: qty 2

## 2021-10-24 MED ORDER — HEPARIN SOD (PORK) LOCK FLUSH 100 UNIT/ML IV SOLN
500.0000 [IU] | Freq: Once | INTRAVENOUS | Status: AC | PRN
  Administered 2021-10-24: 500 [IU]

## 2021-10-24 NOTE — Progress Notes (Signed)
Patient presents today for iron infusion.  Patient is in satisfactory condition with no complaints voiced.  Vital signs are stable.  We will proceed with treatment per MD orders.  Patient tolerated treatment well with no complaints voiced.  Patient left ambulatory in stable condition.  Vital signs stable at discharge.  Follow up as scheduled.    

## 2021-10-24 NOTE — Patient Instructions (Signed)
[facility]  Discharge Instructions: Thank you for choosing [facility] to provide your oncology and hematology care.  If you have a lab appointment with the Cancer Center, please come in thru the Main Entrance and check in at the main information desk.  Wear comfortable clothing and clothing appropriate for easy access to any Portacath or PICC line.   We strive to give you quality time with your provider. You may need to reschedule your appointment if you arrive late (15 or more minutes).  Arriving late affects you and other patients whose appointments are after yours.  Also, if you miss three or more appointments without notifying the office, you may be dismissed from the clinic at the provider's discretion.      For prescription refill requests, have your pharmacy contact our office and allow 72 hours for refills to be completed.        To help prevent nausea and vomiting after your treatment, we encourage you to take your nausea medication as directed.  BELOW ARE SYMPTOMS THAT SHOULD BE REPORTED IMMEDIATELY: *FEVER GREATER THAN 100.4 F (38 C) OR HIGHER *CHILLS OR SWEATING *NAUSEA AND VOMITING THAT IS NOT CONTROLLED WITH YOUR NAUSEA MEDICATION *UNUSUAL SHORTNESS OF BREATH *UNUSUAL BRUISING OR BLEEDING *URINARY PROBLEMS (pain or burning when urinating, or frequent urination) *BOWEL PROBLEMS (unusual diarrhea, constipation, pain near the anus) TENDERNESS IN MOUTH AND THROAT WITH OR WITHOUT PRESENCE OF ULCERS (sore throat, sores in mouth, or a toothache) UNUSUAL RASH, SWELLING OR PAIN  UNUSUAL VAGINAL DISCHARGE OR ITCHING   Items with * indicate a potential emergency and should be followed up as soon as possible or go to the Emergency Department if any problems should occur.  Please show the CHEMOTHERAPY ALERT CARD or IMMUNOTHERAPY ALERT CARD at check-in to the Emergency Department and triage nurse.  Should you have questions after your visit or need to cancel  or reschedule your appointment, please contact [facility] [phone]  and follow the prompts.  Office hours are 8:00 a.m. to 4:30 p.m. Monday - Friday. Please note that voicemails left after 4:00 p.m. may not be returned until the following business day.  We are closed weekends and major holidays. You have access to a nurse at all times for urgent questions. Please call the main number to the clinic [phone] and follow the prompts.  For any non-urgent questions, you may also contact your provider using MyChart. We now offer e-Visits for anyone 18 and older to request care online for non-urgent symptoms. For details visit [email or web address].   Also download the MyChart app! Go to the app store, search "MyChart", open the app, select [facility], and log in with your MyChart username and password.  Due to Covid, a mask is required upon entering the hospital/clinic. If you do not have a mask, one will be given to you upon arrival. For doctor visits, patients may have 1 support person aged 18 or older with them. For treatment visits, patients cannot have anyone with them due to current Covid guidelines and our immunocompromised population.  

## 2021-10-31 ENCOUNTER — Inpatient Hospital Stay (HOSPITAL_COMMUNITY): Payer: Medicaid Other

## 2021-11-07 ENCOUNTER — Other Ambulatory Visit: Payer: Self-pay

## 2021-11-07 ENCOUNTER — Inpatient Hospital Stay (HOSPITAL_COMMUNITY): Payer: Medicaid Other

## 2021-11-07 VITALS — BP 91/60 | HR 57 | Temp 97.6°F | Resp 16 | Wt 112.6 lb

## 2021-11-07 DIAGNOSIS — C439 Malignant melanoma of skin, unspecified: Secondary | ICD-10-CM | POA: Diagnosis not present

## 2021-11-07 DIAGNOSIS — D509 Iron deficiency anemia, unspecified: Secondary | ICD-10-CM

## 2021-11-07 MED ORDER — SODIUM CHLORIDE 0.9 % IV SOLN
Freq: Once | INTRAVENOUS | Status: AC
Start: 2021-11-07 — End: 2021-11-07

## 2021-11-07 MED ORDER — SODIUM CHLORIDE 0.9% FLUSH
10.0000 mL | Freq: Once | INTRAVENOUS | Status: AC | PRN
  Administered 2021-11-07: 12:00:00 10 mL

## 2021-11-07 MED ORDER — HEPARIN SOD (PORK) LOCK FLUSH 100 UNIT/ML IV SOLN
500.0000 [IU] | Freq: Once | INTRAVENOUS | Status: AC | PRN
  Administered 2021-11-07: 12:00:00 500 [IU]

## 2021-11-07 MED ORDER — SODIUM CHLORIDE 0.9 % IV SOLN
300.0000 mg | Freq: Once | INTRAVENOUS | Status: AC
  Administered 2021-11-07: 10:00:00 300 mg via INTRAVENOUS
  Filled 2021-11-07: qty 10

## 2021-11-07 MED ORDER — LORATADINE 10 MG PO TABS
10.0000 mg | ORAL_TABLET | Freq: Once | ORAL | Status: AC
  Administered 2021-11-07: 09:00:00 10 mg via ORAL
  Filled 2021-11-07: qty 1

## 2021-11-07 MED ORDER — ACETAMINOPHEN 325 MG PO TABS
650.0000 mg | ORAL_TABLET | Freq: Once | ORAL | Status: AC
  Administered 2021-11-07: 09:00:00 650 mg via ORAL
  Filled 2021-11-07: qty 2

## 2021-11-07 NOTE — Patient Instructions (Signed)
[facility]  Discharge Instructions: Thank you for choosing [facility] to provide your oncology and hematology care.  If you have a lab appointment with the Cancer Center, please come in thru the Main Entrance and check in at the main information desk.  Wear comfortable clothing and clothing appropriate for easy access to any Portacath or PICC line.   We strive to give you quality time with your provider. You may need to reschedule your appointment if you arrive late (15 or more minutes).  Arriving late affects you and other patients whose appointments are after yours.  Also, if you miss three or more appointments without notifying the office, you may be dismissed from the clinic at the provider's discretion.      For prescription refill requests, have your pharmacy contact our office and allow 72 hours for refills to be completed.        To help prevent nausea and vomiting after your treatment, we encourage you to take your nausea medication as directed.  BELOW ARE SYMPTOMS THAT SHOULD BE REPORTED IMMEDIATELY: *FEVER GREATER THAN 100.4 F (38 C) OR HIGHER *CHILLS OR SWEATING *NAUSEA AND VOMITING THAT IS NOT CONTROLLED WITH YOUR NAUSEA MEDICATION *UNUSUAL SHORTNESS OF BREATH *UNUSUAL BRUISING OR BLEEDING *URINARY PROBLEMS (pain or burning when urinating, or frequent urination) *BOWEL PROBLEMS (unusual diarrhea, constipation, pain near the anus) TENDERNESS IN MOUTH AND THROAT WITH OR WITHOUT PRESENCE OF ULCERS (sore throat, sores in mouth, or a toothache) UNUSUAL RASH, SWELLING OR PAIN  UNUSUAL VAGINAL DISCHARGE OR ITCHING   Items with * indicate a potential emergency and should be followed up as soon as possible or go to the Emergency Department if any problems should occur.  Please show the CHEMOTHERAPY ALERT CARD or IMMUNOTHERAPY ALERT CARD at check-in to the Emergency Department and triage nurse.  Should you have questions after your visit or need to cancel  or reschedule your appointment, please contact [facility] [phone]  and follow the prompts.  Office hours are 8:00 a.m. to 4:30 p.m. Monday - Friday. Please note that voicemails left after 4:00 p.m. may not be returned until the following business day.  We are closed weekends and major holidays. You have access to a nurse at all times for urgent questions. Please call the main number to the clinic [phone] and follow the prompts.  For any non-urgent questions, you may also contact your provider using MyChart. We now offer e-Visits for anyone 18 and older to request care online for non-urgent symptoms. For details visit [email or web address].   Also download the MyChart app! Go to the app store, search "MyChart", open the app, select [facility], and log in with your MyChart username and password.  Due to Covid, a mask is required upon entering the hospital/clinic. If you do not have a mask, one will be given to you upon arrival. For doctor visits, patients may have 1 support person aged 18 or older with them. For treatment visits, patients cannot have anyone with them due to current Covid guidelines and our immunocompromised population.  

## 2021-11-07 NOTE — Progress Notes (Signed)
Patient presents today for iron infusion.  Patient is in satisfactory condition with no new complaints voiced.  Vital signs are stable.  We will proceed with treatment per MD orders.   Patient tolerated treatment well with no complaints voiced.  Patient left ambulatory in stable condition.  Vital signs stable at discharge.  Follow up as scheduled.    

## 2021-11-09 IMAGING — CT CT CHEST-ABD-PELV W/ CM
3 of 5 series · 10 of 36 positions shown, 16 images · IV contrast (Omnipaque or Isovue)
Comparison: 09/25/2020 CT chest, abdomen and pelvis.

CLINICAL DATA: Melanoma diagnosed in October 2019. Assess
treatment response.

EXAM:
CT CHEST, ABDOMEN, AND PELVIS WITH CONTRAST
TECHNIQUE: Multidetector CT imaging of the chest, abdomen and pelvis was
performed following the standard protocol during bolus
administration of intravenous contrast.
CONTRAST:  100mL OMNIPAQUE IOHEXOL 300 MG/ML  SOLN

[Series 2: cap with · axial · 0.62mm/px · z∈[+866,+1271]mm · 5 of 123 slices shown, 10 images]
[im 21/123  mediastinal]
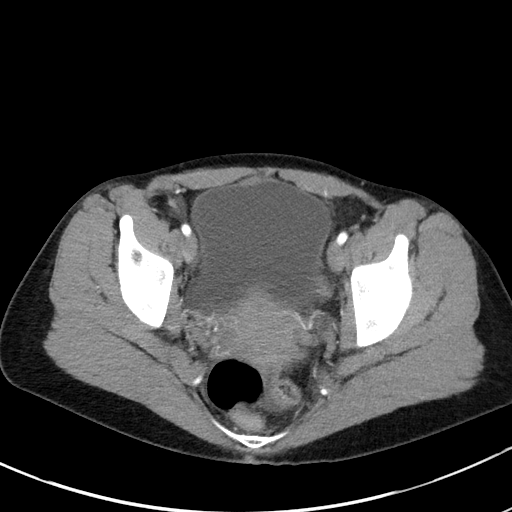
[im 21/123  bone]
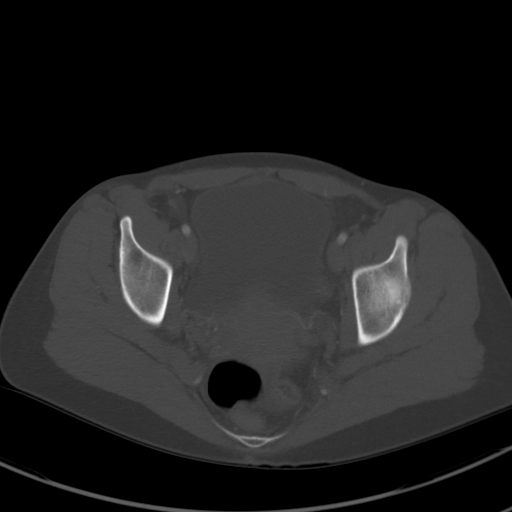
[im 41/123  mediastinal]
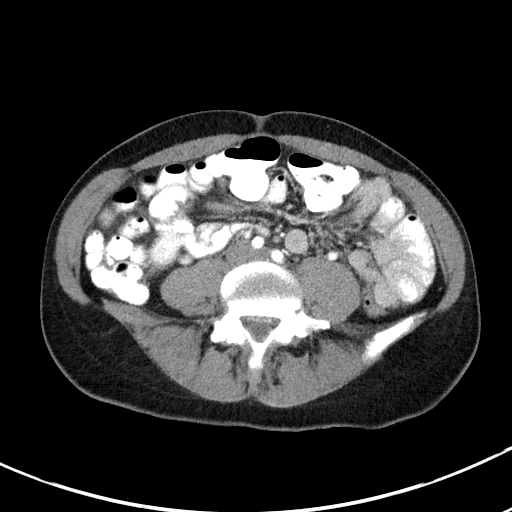
[im 41/123  lung]
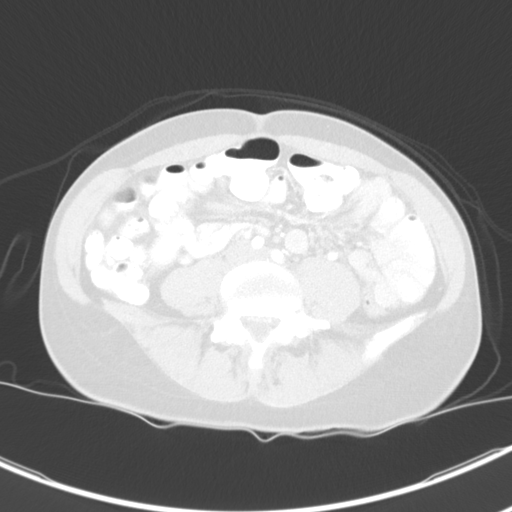
[im 62/123  mediastinal]
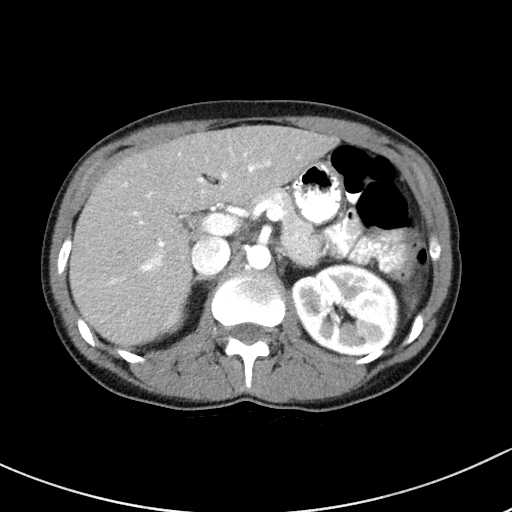
[im 62/123  lung]
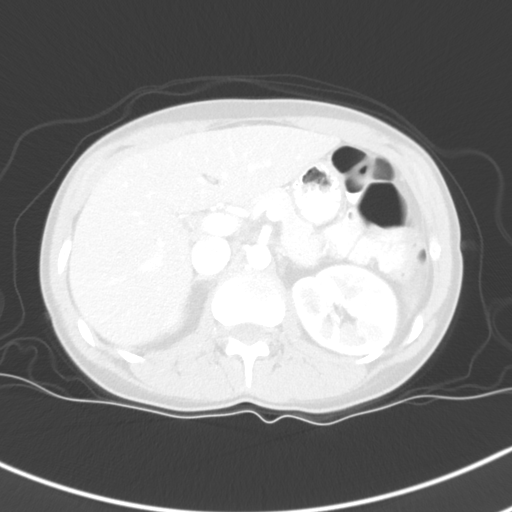
[im 82/123  mediastinal]
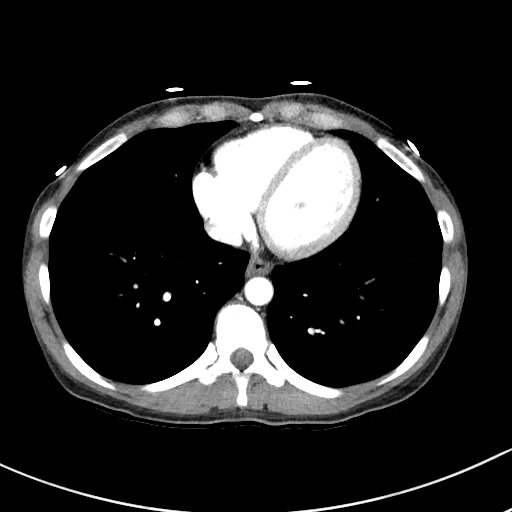
[im 82/123  lung]
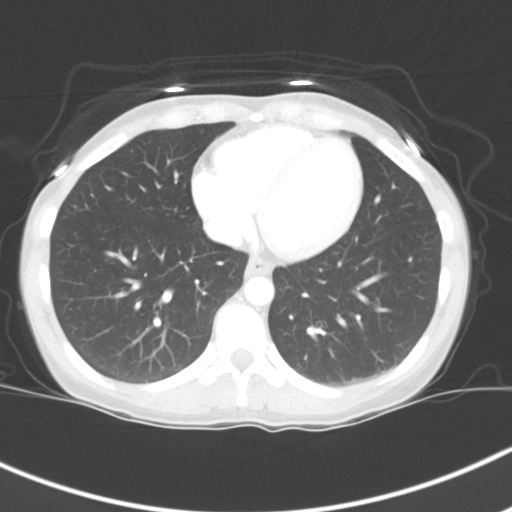
[im 102/123  mediastinal]
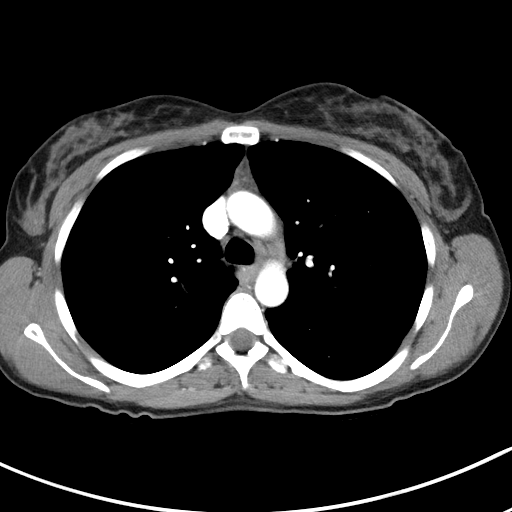
[im 102/123  lung]
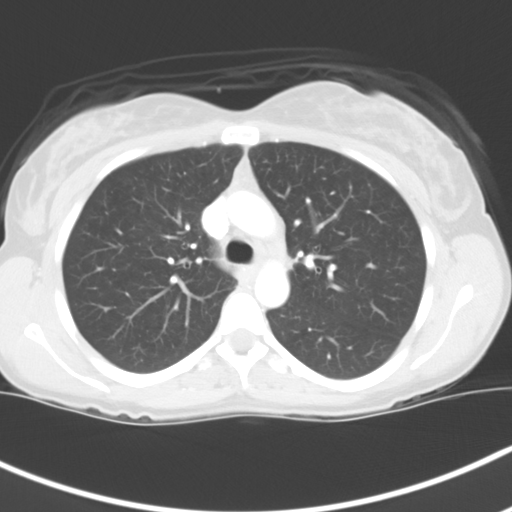

[Series 3: lung · axial · 0.62mm/px · z∈[+836,+908]mm · 2 of 307 slices shown]
[im 37/307  bone]
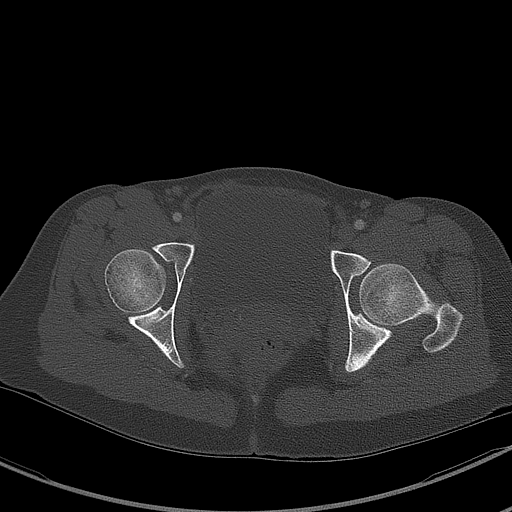
[im 73/307  bone]
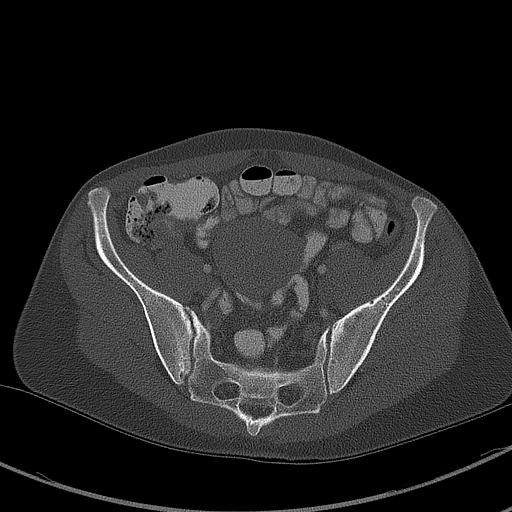

[Series 4: coronals · coronal · 0.74mm/px · 3 of 131 slices shown, 4 images]
[im 27/131  mediastinal]
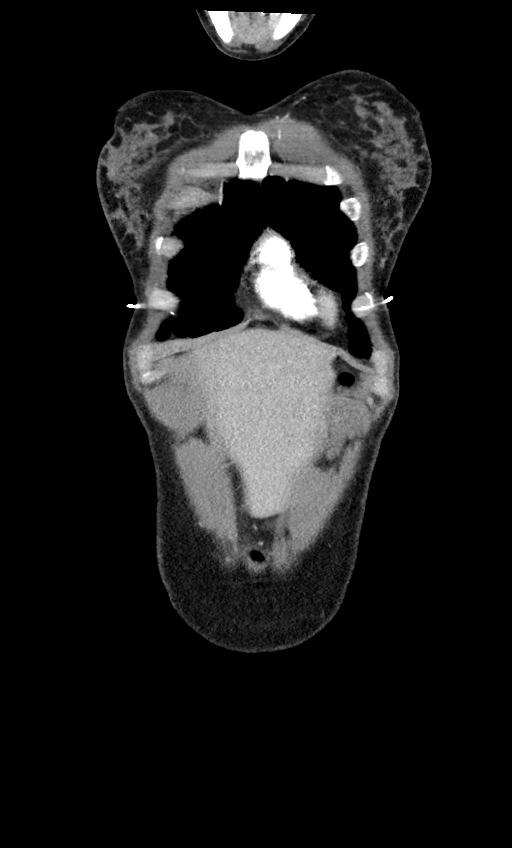
[im 53/131  mediastinal]
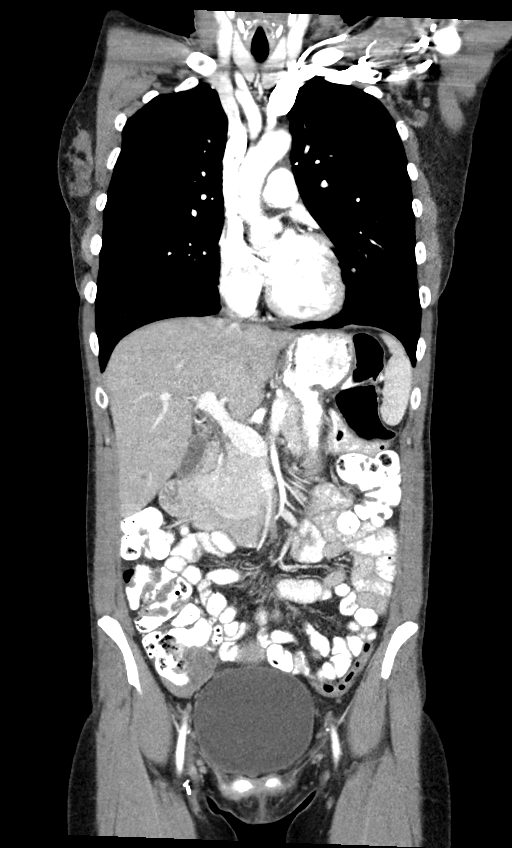
[im 53/131  bone]
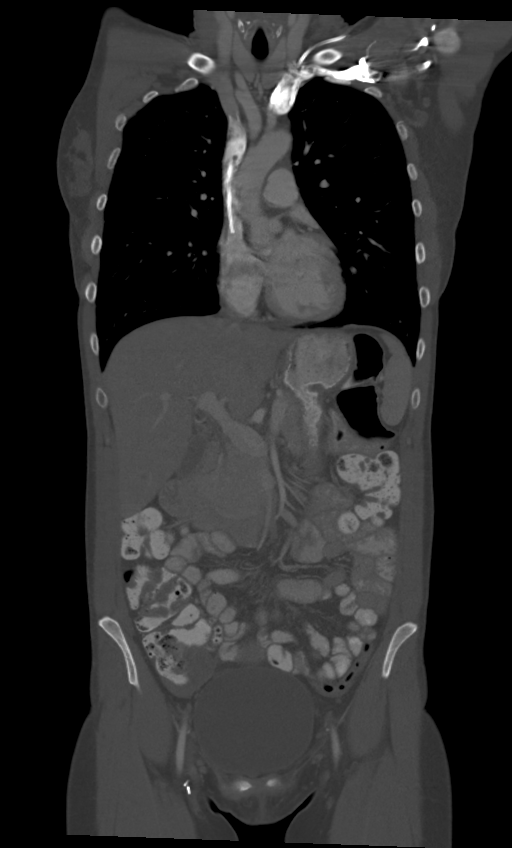
[im 79/131  mediastinal]
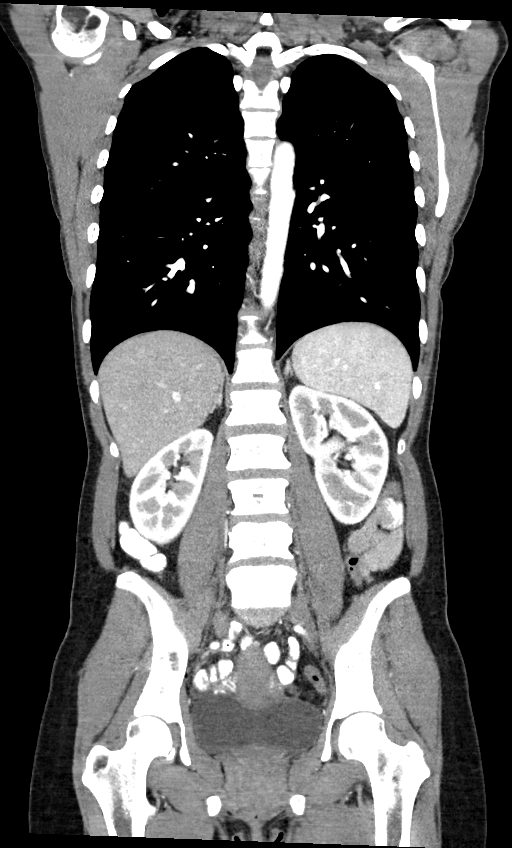

[10 of 36 positions shown; findings below may reference images not displayed]

FINDINGS: CT CHEST FINDINGS

Cardiovascular: Normal heart size. No significant pericardial
effusion/thickening. Great vessels are normal in course and caliber.
No central pulmonary emboli.

Mediastinum/Nodes: No discrete thyroid nodules. Unremarkable
esophagus. No pathologically enlarged axillary, mediastinal or hilar
lymph nodes. Similar triangular focus of mixed soft tissue and
speckled fat density in the anterior mediastinum compatible with
mild thymic hyperplasia.

Lungs/Pleura: No pneumothorax. No pleural effusion. Mild
centrilobular and paraseptal emphysema with mild diffuse bronchial
wall thickening. No acute consolidative airspace disease, lung
masses or significant pulmonary nodules.

Musculoskeletal:  No aggressive appearing focal osseous lesions.

CT ABDOMEN PELVIS FINDINGS

Hepatobiliary: Normal liver size. Three scattered subcentimeter
hypodense liver lesions, too small to characterize, all stable. No
new liver lesions. Normal gallbladder with no radiopaque
cholelithiasis. No biliary ductal dilatation.

Pancreas: Normal, with no mass or duct dilation.

Spleen: Normal size. No mass.

Adrenals/Urinary Tract: Normal adrenals. Normal kidneys with no
hydronephrosis and no renal mass. Normal bladder.

Stomach/Bowel: Normal non-distended stomach. Normal caliber small
bowel with no small bowel wall thickening. Oral contrast transits to
the left colon. Appendix not discretely visualized. Normal large
bowel with no diverticulosis, large bowel wall thickening or
pericolonic fat stranding.

Vascular/Lymphatic: Atherosclerotic nonaneurysmal abdominal aorta.
Patent portal, splenic, hepatic and renal veins. Surgical clips
again noted in the right inguinal region. No pathologically enlarged
lymph nodes in the abdomen or pelvis.

Reproductive: Grossly normal uterus.  No adnexal mass.

Other: No pneumoperitoneum, ascites or focal fluid collection.

Musculoskeletal: No aggressive appearing focal osseous lesions.
IMPRESSION: 1. No evidence of metastatic disease in the chest, abdomen or
pelvis.
2. Mild thymic hyperplasia.
3. Aortic Atherosclerosis (3BBH4-X31.1) and Emphysema (3BBH4-NFQ.E).

## 2021-11-17 ENCOUNTER — Ambulatory Visit (HOSPITAL_COMMUNITY): Payer: Medicaid Other

## 2021-11-24 ENCOUNTER — Other Ambulatory Visit: Payer: Self-pay

## 2021-11-24 ENCOUNTER — Inpatient Hospital Stay (HOSPITAL_COMMUNITY): Payer: Medicaid Other | Attending: Hematology

## 2021-11-24 ENCOUNTER — Encounter (HOSPITAL_COMMUNITY): Payer: Self-pay

## 2021-11-24 VITALS — BP 100/61 | HR 63 | Temp 96.6°F | Resp 18

## 2021-11-24 DIAGNOSIS — D509 Iron deficiency anemia, unspecified: Secondary | ICD-10-CM

## 2021-11-24 DIAGNOSIS — Z808 Family history of malignant neoplasm of other organs or systems: Secondary | ICD-10-CM | POA: Diagnosis not present

## 2021-11-24 DIAGNOSIS — Z79899 Other long term (current) drug therapy: Secondary | ICD-10-CM | POA: Insufficient documentation

## 2021-11-24 DIAGNOSIS — R112 Nausea with vomiting, unspecified: Secondary | ICD-10-CM

## 2021-11-24 DIAGNOSIS — D649 Anemia, unspecified: Secondary | ICD-10-CM | POA: Diagnosis not present

## 2021-11-24 DIAGNOSIS — E039 Hypothyroidism, unspecified: Secondary | ICD-10-CM | POA: Insufficient documentation

## 2021-11-24 DIAGNOSIS — C4371 Malignant melanoma of right lower limb, including hip: Secondary | ICD-10-CM | POA: Insufficient documentation

## 2021-11-24 DIAGNOSIS — Z803 Family history of malignant neoplasm of breast: Secondary | ICD-10-CM | POA: Diagnosis not present

## 2021-11-24 DIAGNOSIS — E041 Nontoxic single thyroid nodule: Secondary | ICD-10-CM | POA: Diagnosis not present

## 2021-11-24 DIAGNOSIS — Z800 Family history of malignant neoplasm of digestive organs: Secondary | ICD-10-CM | POA: Diagnosis not present

## 2021-11-24 MED ORDER — SODIUM CHLORIDE 0.9 % IV SOLN
300.0000 mg | Freq: Once | INTRAVENOUS | Status: AC
  Administered 2021-11-24: 300 mg via INTRAVENOUS
  Filled 2021-11-24: qty 300

## 2021-11-24 MED ORDER — SODIUM CHLORIDE 0.9% FLUSH
10.0000 mL | Freq: Once | INTRAVENOUS | Status: AC | PRN
  Administered 2021-11-24: 10 mL

## 2021-11-24 MED ORDER — LORATADINE 10 MG PO TABS
10.0000 mg | ORAL_TABLET | Freq: Once | ORAL | Status: AC
  Administered 2021-11-24: 10 mg via ORAL
  Filled 2021-11-24: qty 1

## 2021-11-24 MED ORDER — METHYLPREDNISOLONE SODIUM SUCC 125 MG IJ SOLR
125.0000 mg | Freq: Once | INTRAMUSCULAR | Status: AC
  Administered 2021-11-24: 125 mg via INTRAVENOUS
  Filled 2021-11-24: qty 2

## 2021-11-24 MED ORDER — HEPARIN SOD (PORK) LOCK FLUSH 100 UNIT/ML IV SOLN
500.0000 [IU] | Freq: Once | INTRAVENOUS | Status: AC | PRN
  Administered 2021-11-24: 500 [IU]

## 2021-11-24 MED ORDER — SODIUM CHLORIDE 0.9 % IV SOLN
8.0000 mg | Freq: Once | INTRAVENOUS | Status: AC
  Administered 2021-11-24: 8 mg via INTRAVENOUS
  Filled 2021-11-24: qty 4

## 2021-11-24 MED ORDER — ACETAMINOPHEN 325 MG PO TABS
650.0000 mg | ORAL_TABLET | Freq: Once | ORAL | Status: AC
  Administered 2021-11-24: 650 mg via ORAL
  Filled 2021-11-24: qty 2

## 2021-11-24 MED ORDER — PROCHLORPERAZINE MALEATE 10 MG PO TABS: 10.0000 mg | ORAL_TABLET | Freq: Four times a day (QID) | ORAL | 3 refills | Status: DC | PRN

## 2021-11-24 MED ORDER — SODIUM CHLORIDE 0.9 % IV SOLN: Freq: Once | INTRAVENOUS | Status: AC

## 2021-11-24 MED ORDER — ONDANSETRON 8 MG/NS 50 ML IVPB
8.0000 mg | Freq: Once | INTRAVENOUS | Status: DC
  Filled 2021-11-24: qty 54

## 2021-11-24 NOTE — Progress Notes (Signed)
Patient presents today for iron infusion. Able to flush without resistance, no blood return noted. Dye study performed in Feb 2022, okay to proceed with iron infusion via port per Dr. Delton Coombes. Patient would like to have port removed but wants to wait to scheduled removal until after her next visit with Dr. Delton Coombes.  Patient reports having joint aches after infusion on 12/16 but not from the infusion on 12/30. She also reports having nausea everyday since the infusion from 12/30 however she will also have a headache before nausea and if she throws up it goes away. Dr. Delton Coombes made aware. Zofran and Solu-medrol added to plan per Dr. Delton Coombes. Prescription for Compazine sent into patient's pharmacy for nausea at home, patient aware.  Patient tolerated iron infusion with no complaints voiced. Patient refuses to stay full 30 min wait time. Port site clean and dry with good blood return noted before and after infusion. Band aid applied. VSS with discharge and left in satisfactory condition with no s/s of distress noted.

## 2021-11-24 NOTE — Patient Instructions (Signed)
[facility]  Discharge Instructions: Thank you for choosing [facility] to provide your oncology and hematology care.  If you have a lab appointment with the Cancer Center, please come in thru the Main Entrance and check in at the main information desk.  Wear comfortable clothing and clothing appropriate for easy access to any Portacath or PICC line.   We strive to give you quality time with your provider. You may need to reschedule your appointment if you arrive late (15 or more minutes).  Arriving late affects you and other patients whose appointments are after yours.  Also, if you miss three or more appointments without notifying the office, you may be dismissed from the clinic at the provider's discretion.      For prescription refill requests, have your pharmacy contact our office and allow 72 hours for refills to be completed.    Today you received the following: Venofer, return as scheduled.   To help prevent nausea and vomiting after your treatment, we encourage you to take your nausea medication as directed.  BELOW ARE SYMPTOMS THAT SHOULD BE REPORTED IMMEDIATELY: *FEVER GREATER THAN 100.4 F (38 C) OR HIGHER *CHILLS OR SWEATING *NAUSEA AND VOMITING THAT IS NOT CONTROLLED WITH YOUR NAUSEA MEDICATION *UNUSUAL SHORTNESS OF BREATH *UNUSUAL BRUISING OR BLEEDING *URINARY PROBLEMS (pain or burning when urinating, or frequent urination) *BOWEL PROBLEMS (unusual diarrhea, constipation, pain near the anus) TENDERNESS IN MOUTH AND THROAT WITH OR WITHOUT PRESENCE OF ULCERS (sore throat, sores in mouth, or a toothache) UNUSUAL RASH, SWELLING OR PAIN  UNUSUAL VAGINAL DISCHARGE OR ITCHING   Items with * indicate a potential emergency and should be followed up as soon as possible or go to the Emergency Department if any problems should occur.  Please show the CHEMOTHERAPY ALERT CARD or IMMUNOTHERAPY ALERT CARD at check-in to the Emergency Department and triage  nurse.  Should you have questions after your visit or need to cancel or reschedule your appointment, please contact [facility] [phone]  and follow the prompts.  Office hours are 8:00 a.m. to 4:30 p.m. Monday - Friday. Please note that voicemails left after 4:00 p.m. may not be returned until the following business day.  We are closed weekends and major holidays. You have access to a nurse at all times for urgent questions. Please call the main number to the clinic [phone] and follow the prompts.  For any non-urgent questions, you may also contact your provider using MyChart. We now offer e-Visits for anyone 18 and older to request care online for non-urgent symptoms. For details visit [email or web address].   Also download the MyChart app! Go to the app store, search "MyChart", open the app, select , and log in with your MyChart username and password.  Due to Covid, a mask is required upon entering the hospital/clinic. If you do not have a mask, one will be given to you upon arrival. For doctor visits, patients may have 1 support person aged 18 or older with them. For treatment visits, patients cannot have anyone with them due to current Covid guidelines and our immunocompromised population.  

## 2022-01-15 ENCOUNTER — Inpatient Hospital Stay (HOSPITAL_COMMUNITY): Payer: Medicaid Other | Attending: Hematology

## 2022-01-15 DIAGNOSIS — Z809 Family history of malignant neoplasm, unspecified: Secondary | ICD-10-CM | POA: Diagnosis not present

## 2022-01-15 DIAGNOSIS — E039 Hypothyroidism, unspecified: Secondary | ICD-10-CM | POA: Insufficient documentation

## 2022-01-15 DIAGNOSIS — F17210 Nicotine dependence, cigarettes, uncomplicated: Secondary | ICD-10-CM | POA: Insufficient documentation

## 2022-01-15 DIAGNOSIS — R079 Chest pain, unspecified: Secondary | ICD-10-CM | POA: Diagnosis not present

## 2022-01-15 DIAGNOSIS — Z79899 Other long term (current) drug therapy: Secondary | ICD-10-CM | POA: Insufficient documentation

## 2022-01-15 DIAGNOSIS — E032 Hypothyroidism due to medicaments and other exogenous substances: Secondary | ICD-10-CM

## 2022-01-15 DIAGNOSIS — Z8249 Family history of ischemic heart disease and other diseases of the circulatory system: Secondary | ICD-10-CM | POA: Insufficient documentation

## 2022-01-15 DIAGNOSIS — R1013 Epigastric pain: Secondary | ICD-10-CM | POA: Insufficient documentation

## 2022-01-15 DIAGNOSIS — D649 Anemia, unspecified: Secondary | ICD-10-CM | POA: Insufficient documentation

## 2022-01-15 DIAGNOSIS — E041 Nontoxic single thyroid nodule: Secondary | ICD-10-CM | POA: Diagnosis not present

## 2022-01-15 DIAGNOSIS — C439 Malignant melanoma of skin, unspecified: Secondary | ICD-10-CM

## 2022-01-15 DIAGNOSIS — Z840 Family history of diseases of the skin and subcutaneous tissue: Secondary | ICD-10-CM | POA: Insufficient documentation

## 2022-01-15 DIAGNOSIS — Z8489 Family history of other specified conditions: Secondary | ICD-10-CM | POA: Diagnosis not present

## 2022-01-15 DIAGNOSIS — M25569 Pain in unspecified knee: Secondary | ICD-10-CM | POA: Diagnosis not present

## 2022-01-15 DIAGNOSIS — C4371 Malignant melanoma of right lower limb, including hip: Secondary | ICD-10-CM | POA: Insufficient documentation

## 2022-01-15 LAB — CBC WITH DIFFERENTIAL/PLATELET
Abs Immature Granulocytes: 0.01 10*3/uL (ref 0.00–0.07)
Basophils Absolute: 0 10*3/uL (ref 0.0–0.1)
Basophils Relative: 1 %
Eosinophils Absolute: 0.1 10*3/uL (ref 0.0–0.5)
Eosinophils Relative: 3 %
HCT: 40 % (ref 36.0–46.0)
Hemoglobin: 13.2 g/dL (ref 12.0–15.0)
Immature Granulocytes: 0 %
Lymphocytes Relative: 37 %
Lymphs Abs: 1.4 10*3/uL (ref 0.7–4.0)
MCH: 31.2 pg (ref 26.0–34.0)
MCHC: 33 g/dL (ref 30.0–36.0)
MCV: 94.6 fL (ref 80.0–100.0)
Monocytes Absolute: 0.2 10*3/uL (ref 0.1–1.0)
Monocytes Relative: 6 %
Neutro Abs: 2 10*3/uL (ref 1.7–7.7)
Neutrophils Relative %: 53 %
Platelets: 181 10*3/uL (ref 150–400)
RBC: 4.23 MIL/uL (ref 3.87–5.11)
RDW: 15.1 % (ref 11.5–15.5)
WBC: 3.8 10*3/uL — ABNORMAL LOW (ref 4.0–10.5)
nRBC: 0 % (ref 0.0–0.2)

## 2022-01-15 LAB — COMPREHENSIVE METABOLIC PANEL
ALT: 17 U/L (ref 0–44)
AST: 19 U/L (ref 15–41)
Albumin: 4 g/dL (ref 3.5–5.0)
Alkaline Phosphatase: 61 U/L (ref 38–126)
Anion gap: 7 (ref 5–15)
BUN: 7 mg/dL (ref 6–20)
CO2: 26 mmol/L (ref 22–32)
Calcium: 9.5 mg/dL (ref 8.9–10.3)
Chloride: 104 mmol/L (ref 98–111)
Creatinine, Ser: 0.63 mg/dL (ref 0.44–1.00)
GFR, Estimated: >60 mL/min (ref >60)
Glucose, Bld: 95 mg/dL (ref 70–99)
Potassium: 3.9 mmol/L (ref 3.5–5.1)
Sodium: 137 mmol/L (ref 135–145)
Total Bilirubin: 0.2 mg/dL — ABNORMAL LOW (ref 0.3–1.2)
Total Protein: 7.1 g/dL (ref 6.5–8.1)

## 2022-01-15 LAB — LACTATE DEHYDROGENASE: LDH: 97 U/L — ABNORMAL LOW (ref 98–192)

## 2022-01-15 LAB — IRON AND TIBC
Iron: 67 ug/dL (ref 28–170)
Saturation Ratios: 19 % (ref 10.4–31.8)
TIBC: 360 ug/dL (ref 250–450)
UIBC: 293 ug/dL

## 2022-01-15 LAB — TSH: TSH: 2.981 u[IU]/mL (ref 0.350–4.500)

## 2022-01-15 LAB — FERRITIN: Ferritin: 32 ng/mL (ref 11–307)

## 2022-01-22 ENCOUNTER — Other Ambulatory Visit (HOSPITAL_COMMUNITY): Payer: Self-pay | Admitting: Hematology

## 2022-01-22 ENCOUNTER — Other Ambulatory Visit (HOSPITAL_COMMUNITY): Payer: Self-pay

## 2022-01-22 ENCOUNTER — Other Ambulatory Visit: Payer: Self-pay

## 2022-01-22 ENCOUNTER — Inpatient Hospital Stay (HOSPITAL_COMMUNITY): Payer: Medicaid Other | Admitting: Hematology

## 2022-01-22 VITALS — BP 110/68 | HR 68 | Temp 97.0°F | Resp 18 | Ht 62.0 in | Wt 119.4 lb

## 2022-01-22 DIAGNOSIS — F17210 Nicotine dependence, cigarettes, uncomplicated: Secondary | ICD-10-CM | POA: Diagnosis not present

## 2022-01-22 DIAGNOSIS — E041 Nontoxic single thyroid nodule: Secondary | ICD-10-CM | POA: Diagnosis not present

## 2022-01-22 DIAGNOSIS — D500 Iron deficiency anemia secondary to blood loss (chronic): Secondary | ICD-10-CM

## 2022-01-22 DIAGNOSIS — C4371 Malignant melanoma of right lower limb, including hip: Secondary | ICD-10-CM

## 2022-01-22 DIAGNOSIS — M25569 Pain in unspecified knee: Secondary | ICD-10-CM | POA: Diagnosis not present

## 2022-01-22 DIAGNOSIS — Z8489 Family history of other specified conditions: Secondary | ICD-10-CM | POA: Diagnosis not present

## 2022-01-22 DIAGNOSIS — Z79899 Other long term (current) drug therapy: Secondary | ICD-10-CM | POA: Diagnosis not present

## 2022-01-22 DIAGNOSIS — R079 Chest pain, unspecified: Secondary | ICD-10-CM | POA: Diagnosis not present

## 2022-01-22 DIAGNOSIS — E039 Hypothyroidism, unspecified: Secondary | ICD-10-CM | POA: Diagnosis not present

## 2022-01-22 DIAGNOSIS — R1013 Epigastric pain: Secondary | ICD-10-CM | POA: Diagnosis not present

## 2022-01-22 DIAGNOSIS — Z8249 Family history of ischemic heart disease and other diseases of the circulatory system: Secondary | ICD-10-CM | POA: Diagnosis not present

## 2022-01-22 DIAGNOSIS — D649 Anemia, unspecified: Secondary | ICD-10-CM | POA: Diagnosis not present

## 2022-01-22 DIAGNOSIS — Z809 Family history of malignant neoplasm, unspecified: Secondary | ICD-10-CM | POA: Diagnosis not present

## 2022-01-22 DIAGNOSIS — Z840 Family history of diseases of the skin and subcutaneous tissue: Secondary | ICD-10-CM | POA: Diagnosis not present

## 2022-01-22 MED ORDER — VARENICLINE TARTRATE 0.5 MG PO TABS: ORAL_TABLET | ORAL | 0 refills | Status: DC

## 2022-01-22 NOTE — Progress Notes (Signed)
? ?[facility] ?[address] ?[place], [place] [address] ? ? ?CLINIC:  ?Medical Oncology/Hematology ? ?PCP:  ?Patient, No Pcp Per (Inactive) ?None ?None ? ? ?REASON FOR VISIT:  ?Follow-up for malignant melanoma ? 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 ?NGS Results: BRAF V600E and V600K negative ? ?CURRENT THERAPY: Adjuvant Keytruda every 3 weeks ? ?BRIEF ONCOLOGIC HISTORY:  ?Oncology History  ?Melanoma of skin ([place])  ?12/11/2019 Initial Diagnosis  ? Melanoma of skin ([place]) ?  ?02/19/2020 Genetic Testing  ? BRAF Mutation Analysis ? ? ?  ?03/25/2020 -  Chemotherapy  ?  Patient is on Treatment Plan: MELANOMA PEMBROLIZUMAB Q21D ? ?  ? ?  ? ? ?CANCER STAGING: ?Cancer Staging  ?Melanoma of skin ([place]) ?Staging form: Melanoma of the Skin, AJCC 8th Edition ?- Clinical stage from 02/08/2020: Stage III (cT2b, cN2a, cM0) - Unsigned ? ? ?INTERVAL HISTORY:  ?Donna Munoz, a 46 y.o. female, returns for routine follow-up of her malignant melanoma. Donna Munoz was last seen on 10/23/2021.  ? ?Today she reports feeling well. She reports pain in her knees and elbow. She also reports pain in her right foot at the site where one of her toes was removed. She reports pain under her right breast and epigastric pain. She has gained 7 lbs since her last visit.  ? ?REVIEW OF SYSTEMS:  ?Review of Systems  ?Constitutional:  Negative for unexpected weight change.  ?Cardiovascular:  Positive for chest pain (under R breast).  ?Gastrointestinal:  Positive for abdominal pain (epigastric).  ?Musculoskeletal:  Positive for arthralgias (knees).  ?All other systems reviewed and are negative. ? ?PAST MEDICAL/SURGICAL HISTORY:  ?Past Medical History:  ?Diagnosis Date  ? Anemia   ? Anxiety   ? Depression   ? GERD (gastroesophageal reflux disease)   ? Headache   ? Shingles   ? Tobacco abuse 05/06/2020  ? ?Past Surgical History:  ?Procedure Laterality Date  ? AMPUTATION TOE Right  03/07/2020  ? Procedure: RIGHT THIRD TOE AMPUTATION;  Surgeon: [name], MD;  Location: [facility];  Service: General;  Laterality: Right;  ? BREAST LUMPECTOMY  age 7  ? CESAREAN SECTION  01/06/2006  ? CESAREAN SECTION  10/15/2016  ? DENTAL SURGERY  2019  ? INGUINAL HERNIA REPAIR Right 01/30/2020  ? Procedure: RIGHT INGUINAL HERNIA REPAIR WITH  MESH;  Surgeon: [name], MD;  Location: [facility];  Service: General;  Laterality: Right;  ? INSERTION OF MESH Right 01/30/2020  ? Procedure: INSERTION OF MESH;  Surgeon: [name], MD;  Location: [facility];  Service: General;  Laterality: Right;  ? IR CV LINE INJECTION  09/11/2020  ? IR IMAGING GUIDED PORT INSERTION  09/12/2020  ? IR REMOVAL TUN ACCESS W/ PORT W/O FL MOD SED  09/12/2020  ? IRRIGATION AND DEBRIDEMENT ABSCESS Right 03/07/2020  ? Procedure: Aspiration of Right Groin Seroma;  Surgeon: [name], MD;  Location: [facility];  Service: General;  Laterality: Right;  ? MELANOMA EXCISION WITH SENTINEL LYMPH NODE BIOPSY Right 01/30/2020  ? Procedure: WIDE LOWER EXCISION RIGHT THIRD TOE MELANOMA EXCISION WITH SENTINEL LYMPH NODE BIOPSY;  Surgeon: [name], MD;  Location: [facility];  Service: General;  Laterality: Right;  ? PORTACATH PLACEMENT N/A 03/07/2020  ? Procedure: INSERTION PORT-A-CATH;  Surgeon: [name], MD;  Location: [facility];  Service: General;  Laterality: Left  ? ? ?SOCIAL HISTORY:  ?Social  History  ? ?Socioeconomic History  ? Marital status: Legally Separated  ?  Spouse name: Not on file  ? Number of children: 2  ? Years of education: Not on file  ? Highest education level: Not on file  ?Occupational History  ? Occupation: unemployed d/t COVID  ?Tobacco Use  ? Smoking status: Every Day  ?  Packs/day: 1.00  ?  Types: Cigarettes  ? Smokeless tobacco: Never  ?Vaping Use  ? Vaping Use: Never used  ?Substance and Sexual Activity  ? Alcohol use: No  ? Drug use: Yes  ?  Types: Marijuana  ? Sexual activity:  Yes  ?Other Topics Concern  ? Not on file  ?Social History Narrative  ? Not on file  ? ?Social Determinants of Health  ? ?Financial Resource Strain: Not on file  ?Food Insecurity: Not on file  ?Transportation Needs: Not on file  ?Physical Activity: Not on file  ?Stress: Not on file  ?Social Connections: Not on file  ?Intimate Partner Violence: Not on file  ? ? ?FAMILY HISTORY:  ?Family History  ?Problem Relation Age of Onset  ? Heart disease Mother   ? Cancer Mother   ? Ovarian cysts Sister   ? Healthy Brother   ? Cancer Maternal Grandmother   ? Heart attack Maternal Grandmother   ? Cancer Maternal Grandfather   ? Cancer Paternal Grandmother   ? Healthy Sister   ? Healthy Sister   ? Psoriasis Daughter   ? Healthy Son   ? ? ?CURRENT MEDICATIONS:  ?Current Outpatient Medications  ?Medication Sig Dispense Refill  ? albuterol (VENTOLIN HFA) 108 (90 Base) MCG/ACT inhaler Inhale into the lungs. (Patient not taking: Reported on 06/19/2021)    ? gabapentin (NEURONTIN) 100 MG capsule Take by mouth daily.    ? ibuprofen (ADVIL) 800 MG tablet Take 800 mg by mouth every 8 (eight) hours.    ? levothyroxine (SYNTHROID) 25 MCG tablet Take 1 tablet (25 mcg total) by mouth daily before breakfast. 30 tablet 6  ? lidocaine-prilocaine (EMLA) cream Apply a small amount to port a cath site (do not rub in) and cover with plastic wrap 1 hour prior to chemotherapy appointments 30 g 3  ? ondansetron (ZOFRAN ODT) 4 MG disintegrating tablet Take 1 tablet (4 mg total) by mouth every 8 (eight) hours as needed for nausea or vomiting. 30 tablet 0  ? pantoprazole (PROTONIX) 40 MG tablet Take 1 tablet (40 mg total) by mouth daily. Please make overdue appt with Dr. Harrington Challenger before anymore refills. Thank you 1st attempt 30 tablet 0  ? prochlorperazine (COMPAZINE) 10 MG tablet Take 1 tablet (10 mg total) by mouth every 6 (six) hours as needed for nausea or vomiting. (Patient not taking: Reported on 11/24/2021) 30 tablet 3  ? TRANSDERM-SCOP 1 MG/3DAYS 1 patch  every 3 (three) days.    ? ?No current facility-administered medications for this visit.  ? ? ?ALLERGIES:  ?No Known Allergies ? ?PHYSICAL EXAM:  ?Performance status (ECOG): 0 - Asymptomatic ? ?There were no vitals filed for this visit. ?Wt Readings from Last 3 Encounters:  ?11/07/21 112 lb 9.6 oz (51.1 kg)  ?10/24/21 114 lb (51.7 kg)  ?10/23/21 112 lb 12.8 oz (51.2 kg)  ? ?Physical Exam ?Vitals reviewed.  ?Constitutional:   ?   Appearance: Normal appearance.  ?Cardiovascular:  ?   Rate and Rhythm: Normal rate and regular rhythm.  ?   Pulses: Normal pulses.  ?   Heart sounds: Normal heart sounds.  ?  Pulmonary:  ?   Effort: Pulmonary effort is normal.  ?   Breath sounds: Normal breath sounds.  ?Abdominal:  ?   Palpations: Abdomen is soft. There is no hepatomegaly, splenomegaly or mass.  ?   Tenderness: There is abdominal tenderness in the epigastric area.  ?Lymphadenopathy:  ?   Cervical: No cervical adenopathy.  ?   Right cervical: No superficial cervical adenopathy. ?   Left cervical: No superficial cervical adenopathy.  ?   Upper Body:  ?   Right upper body: No supraclavicular adenopathy.  ?   Left upper body: No supraclavicular adenopathy.  ?Neurological:  ?   General: No focal deficit present.  ?   Mental Status: She is alert and oriented to person, place, and time.  ?Psychiatric:     ?   Mood and Affect: Mood normal.     ?   Behavior: Behavior normal.  ?  ? ?LABORATORY DATA:  ?I have reviewed the labs as listed.  ?CBC Latest Ref Rng & Units 01/15/2022 10/17/2021 06/17/2021  ?WBC 4.0 - 10.5 K/uL 3.8(L) 4.1 5.5  ?Hemoglobin 12.0 - 15.0 g/dL 13.2 11.2(L) 10.1(L)  ?Hematocrit 36.0 - 46.0 % 40.0 37.1 32.4(L)  ?Platelets 150 - 400 K/uL 181 246 231  ? ?CMP Latest Ref Rng & Units 01/15/2022 10/17/2021 06/17/2021  ?Glucose 70 - 99 mg/dL 95 95 97  ?BUN 6 - 20 mg/dL 7 9 6   ?Creatinine 0.44 - 1.00 mg/dL 0.63 0.70 0.60  ?Sodium 135 - 145 mmol/L 137 137 138  ?Potassium 3.5 - 5.1 mmol/L 3.9 4.1 3.9  ?Chloride 98 - 111 mmol/L 104 106 107   ?CO2 22 - 32 mmol/L 26 27 25   ?Calcium 8.9 - 10.3 mg/dL 9.5 9.9 9.2  ?Total Protein 6.5 - 8.1 g/dL 7.1 7.5 6.8  ?Total Bilirubin 0.3 - 1.2 mg/dL 0.2(L) 0.3 0.3  ?Alkaline Phos 38 - 126 U/L 61 83 74

## 2022-01-22 NOTE — Patient Instructions (Signed)
[place] at [facility] ?Discharge Instructions ? ? ?You were seen and examined today by Dr. Delton Coombes. ? ?He reviewed the results of your lab work which is normal/stable.  ? ?Return as scheduled for lab work and office visit.  ? ? ?Thank you for choosing [place] at [facility] to provide your oncology and hematology care.  To afford each patient quality time with our provider, please arrive at least 15 minutes before your scheduled appointment time.  ? ?If you have a lab appointment with the [facility] please come in thru the Main Entrance and check in at the main information desk. ? ?You need to re-schedule your appointment should you arrive 10 or more minutes late.  We strive to give you quality time with our providers, and arriving late affects you and other patients whose appointments are after yours.  Also, if you no show three or more times for appointments you may be dismissed from the clinic at the providers discretion.     ?Again, thank you for choosing [facility].  Our hope is that these requests will decrease the amount of time that you wait before being seen by our physicians.       ?_____________________________________________________________ ? ?Should you have questions after your visit to [facility], please contact our office at [phone] and follow the prompts.  Our office hours are 8:00 a.m. and 4:30 p.m. Monday - Friday.  Please note that voicemails left after 4:00 p.m. may not be returned until the following business day.  We are closed weekends and major holidays.  You do have access to a nurse 24-7, just call the main number to the clinic [phone] and do not press any options, hold on the line and a nurse will answer the phone.   ? ?For prescription refill requests, have your pharmacy contact our office and allow 72 hours.   ? ?Due to Covid, you will need to wear a mask upon entering the hospital. If  you do not have a mask, a mask will be given to you at the Main Entrance upon arrival. For doctor visits, patients may have 1 support person age 57 or older with them. For treatment visits, patients can not have anyone with them due to social distancing guidelines and our immunocompromised population.  ? ?   ?

## 2022-02-12 ENCOUNTER — Other Ambulatory Visit (HOSPITAL_COMMUNITY): Payer: Self-pay | Admitting: Hematology

## 2022-02-24 DIAGNOSIS — M792 Neuralgia and neuritis, unspecified: Secondary | ICD-10-CM | POA: Diagnosis not present

## 2022-02-24 DIAGNOSIS — C4371 Malignant melanoma of right lower limb, including hip: Secondary | ICD-10-CM | POA: Diagnosis not present

## 2022-03-04 DIAGNOSIS — D2262 Melanocytic nevi of left upper limb, including shoulder: Secondary | ICD-10-CM | POA: Diagnosis not present

## 2022-03-04 DIAGNOSIS — L301 Dyshidrosis [pompholyx]: Secondary | ICD-10-CM | POA: Diagnosis not present

## 2022-03-04 DIAGNOSIS — I781 Nevus, non-neoplastic: Secondary | ICD-10-CM | POA: Diagnosis not present

## 2022-03-04 DIAGNOSIS — D2371 Other benign neoplasm of skin of right lower limb, including hip: Secondary | ICD-10-CM | POA: Diagnosis not present

## 2022-03-04 DIAGNOSIS — Z85820 Personal history of malignant melanoma of skin: Secondary | ICD-10-CM | POA: Diagnosis not present

## 2022-03-04 DIAGNOSIS — L814 Other melanin hyperpigmentation: Secondary | ICD-10-CM | POA: Diagnosis not present

## 2022-03-04 DIAGNOSIS — Z08 Encounter for follow-up examination after completed treatment for malignant neoplasm: Secondary | ICD-10-CM | POA: Diagnosis not present

## 2022-03-04 DIAGNOSIS — D485 Neoplasm of uncertain behavior of skin: Secondary | ICD-10-CM | POA: Diagnosis not present

## 2022-03-07 ENCOUNTER — Other Ambulatory Visit (HOSPITAL_COMMUNITY): Payer: Self-pay | Admitting: Hematology

## 2022-03-09 ENCOUNTER — Encounter (HOSPITAL_COMMUNITY): Payer: Self-pay | Admitting: Hematology

## 2022-03-22 ENCOUNTER — Other Ambulatory Visit (HOSPITAL_COMMUNITY): Payer: Self-pay | Admitting: Hematology

## 2022-03-23 ENCOUNTER — Encounter (HOSPITAL_COMMUNITY): Payer: Self-pay | Admitting: Hematology

## 2022-04-18 ENCOUNTER — Other Ambulatory Visit: Payer: Self-pay | Admitting: Nurse Practitioner

## 2022-04-24 ENCOUNTER — Inpatient Hospital Stay (HOSPITAL_COMMUNITY): Payer: Medicaid Other | Attending: Hematology

## 2022-04-24 ENCOUNTER — Ambulatory Visit (HOSPITAL_COMMUNITY)
Admission: RE | Admit: 2022-04-24 | Discharge: 2022-04-24 | Disposition: A | Discharge status: Home / Self Care | Payer: Medicaid Other | Source: Ambulatory Visit | Attending: Hematology | Admitting: Hematology

## 2022-04-24 DIAGNOSIS — C4371 Malignant melanoma of right lower limb, including hip: Secondary | ICD-10-CM

## 2022-04-24 DIAGNOSIS — K7689 Other specified diseases of liver: Secondary | ICD-10-CM | POA: Diagnosis not present

## 2022-04-24 DIAGNOSIS — Z79899 Other long term (current) drug therapy: Secondary | ICD-10-CM | POA: Insufficient documentation

## 2022-04-24 DIAGNOSIS — K449 Diaphragmatic hernia without obstruction or gangrene: Secondary | ICD-10-CM | POA: Diagnosis not present

## 2022-04-24 DIAGNOSIS — C439 Malignant melanoma of skin, unspecified: Secondary | ICD-10-CM | POA: Diagnosis not present

## 2022-04-24 DIAGNOSIS — I700 Atherosclerosis of aorta: Secondary | ICD-10-CM | POA: Diagnosis not present

## 2022-04-24 DIAGNOSIS — J432 Centrilobular emphysema: Secondary | ICD-10-CM | POA: Diagnosis not present

## 2022-04-24 DIAGNOSIS — N200 Calculus of kidney: Secondary | ICD-10-CM | POA: Diagnosis not present

## 2022-04-24 DIAGNOSIS — J929 Pleural plaque without asbestos: Secondary | ICD-10-CM | POA: Diagnosis not present

## 2022-04-24 DIAGNOSIS — D500 Iron deficiency anemia secondary to blood loss (chronic): Secondary | ICD-10-CM

## 2022-04-24 LAB — IRON AND TIBC
Iron: 70 ug/dL (ref 28–170)
Saturation Ratios: 18 % (ref 10.4–31.8)
TIBC: 393 ug/dL (ref 250–450)
UIBC: 323 ug/dL

## 2022-04-24 LAB — LACTATE DEHYDROGENASE: LDH: 86 U/L — ABNORMAL LOW (ref 98–192)

## 2022-04-24 LAB — CBC WITH DIFFERENTIAL/PLATELET
Abs Immature Granulocytes: 0.01 10*3/uL (ref 0.00–0.07)
Basophils Absolute: 0 10*3/uL (ref 0.0–0.1)
Basophils Relative: 1 %
Eosinophils Absolute: 0.2 10*3/uL (ref 0.0–0.5)
Eosinophils Relative: 4 %
HCT: 39.3 % (ref 36.0–46.0)
Hemoglobin: 13.3 g/dL (ref 12.0–15.0)
Immature Granulocytes: 0 %
Lymphocytes Relative: 29 %
Lymphs Abs: 1.6 10*3/uL (ref 0.7–4.0)
MCH: 32 pg (ref 26.0–34.0)
MCHC: 33.8 g/dL (ref 30.0–36.0)
MCV: 94.5 fL (ref 80.0–100.0)
Monocytes Absolute: 0.4 10*3/uL (ref 0.1–1.0)
Monocytes Relative: 8 %
Neutro Abs: 3.2 10*3/uL (ref 1.7–7.7)
Neutrophils Relative %: 58 %
Platelets: 219 10*3/uL (ref 150–400)
RBC: 4.16 MIL/uL (ref 3.87–5.11)
RDW: 12.4 % (ref 11.5–15.5)
WBC: 5.4 10*3/uL (ref 4.0–10.5)
nRBC: 0 % (ref 0.0–0.2)

## 2022-04-24 LAB — COMPREHENSIVE METABOLIC PANEL
ALT: 15 U/L (ref 0–44)
AST: 21 U/L (ref 15–41)
Albumin: 3.9 g/dL (ref 3.5–5.0)
Alkaline Phosphatase: 66 U/L (ref 38–126)
Anion gap: 5 (ref 5–15)
BUN: 5 mg/dL — ABNORMAL LOW (ref 6–20)
CO2: 25 mmol/L (ref 22–32)
Calcium: 9.3 mg/dL (ref 8.9–10.3)
Chloride: 107 mmol/L (ref 98–111)
Creatinine, Ser: 0.62 mg/dL (ref 0.44–1.00)
GFR, Estimated: >60 mL/min (ref >60)
Glucose, Bld: 85 mg/dL (ref 70–99)
Potassium: 3.5 mmol/L (ref 3.5–5.1)
Sodium: 137 mmol/L (ref 135–145)
Total Bilirubin: 0.4 mg/dL (ref 0.3–1.2)
Total Protein: 6.8 g/dL (ref 6.5–8.1)

## 2022-04-24 LAB — FERRITIN: Ferritin: 11 ng/mL (ref 11–307)

## 2022-04-24 LAB — TSH: TSH: 3.836 u[IU]/mL (ref 0.350–4.500)

## 2022-04-24 MED ORDER — GADOBUTROL 1 MMOL/ML IV SOLN
5.0000 mL | Freq: Once | INTRAVENOUS | Status: AC | PRN
  Administered 2022-04-24: 5 mL via INTRAVENOUS

## 2022-04-24 MED ORDER — IOHEXOL 300 MG/ML  SOLN
75.0000 mL | Freq: Once | INTRAMUSCULAR | Status: AC | PRN
  Administered 2022-04-24: 75 mL via INTRAVENOUS

## 2022-04-30 ENCOUNTER — Encounter (HOSPITAL_COMMUNITY): Payer: Self-pay | Admitting: Hematology

## 2022-04-30 ENCOUNTER — Inpatient Hospital Stay (HOSPITAL_BASED_OUTPATIENT_CLINIC_OR_DEPARTMENT_OTHER): Payer: Medicaid Other | Admitting: Hematology

## 2022-04-30 VITALS — BP 107/74 | HR 70 | Temp 97.6°F | Resp 18 | Ht 62.0 in | Wt 113.7 lb

## 2022-04-30 DIAGNOSIS — Z79899 Other long term (current) drug therapy: Secondary | ICD-10-CM | POA: Diagnosis not present

## 2022-04-30 DIAGNOSIS — D500 Iron deficiency anemia secondary to blood loss (chronic): Secondary | ICD-10-CM | POA: Diagnosis not present

## 2022-04-30 DIAGNOSIS — C4371 Malignant melanoma of right lower limb, including hip: Secondary | ICD-10-CM

## 2022-04-30 NOTE — Patient Instructions (Addendum)
[place] at [facility] Discharge Instructions   You were seen and examined today by Dr. Delton Coombes.  He reviewed the results of your lab work, CT scan, and MRI. All results are normal/stable.   Start taking iron tablet 325 mg every other day.   Return as scheduled.    Thank you for choosing [place] at [facility] to provide your oncology and hematology care.  To afford each patient quality time with our provider, please arrive at least 15 minutes before your scheduled appointment time.   If you have a lab appointment with the [facility] please come in thru the Main Entrance and check in at the main information desk.  You need to re-schedule your appointment should you arrive 10 or more minutes late.  We strive to give you quality time with our providers, and arriving late affects you and other patients whose appointments are after yours.  Also, if you no show three or more times for appointments you may be dismissed from the clinic at the providers discretion.     Again, thank you for choosing [facility].  Our hope is that these requests will decrease the amount of time that you wait before being seen by our physicians.       _____________________________________________________________  Should you have questions after your visit to [facility], please contact our office at [phone] and follow the prompts.  Our office hours are 8:00 a.m. and 4:30 p.m. Monday - Friday.  Please note that voicemails left after 4:00 p.m. may not be returned until the following business day.  We are closed weekends and major holidays.  You do have access to a nurse 24-7, just call the main number to the clinic [phone] and do not press any options, hold on the line and a nurse will answer the phone.    For prescription refill requests, have your pharmacy contact our office and allow 72 hours.    Due to Covid, you will need  to wear a mask upon entering the hospital. If you do not have a mask, a mask will be given to you at the Main Entrance upon arrival. For doctor visits, patients may have 1 support person age 80 or older with them. For treatment visits, patients can not have anyone with them due to social distancing guidelines and our immunocompromised population.

## 2022-06-09 ENCOUNTER — Ambulatory Visit: Payer: Self-pay | Admitting: General Surgery

## 2022-06-17 ENCOUNTER — Ambulatory Visit (HOSPITAL_COMMUNITY)
Admission: RE | Admit: 2022-06-17 | Discharge: 2022-06-17 | Disposition: A | Discharge status: Home / Self Care | Payer: Medicaid Other | Source: Ambulatory Visit | Attending: Hematology | Admitting: Hematology

## 2022-06-17 DIAGNOSIS — Z1231 Encounter for screening mammogram for malignant neoplasm of breast: Secondary | ICD-10-CM | POA: Insufficient documentation

## 2022-06-17 DIAGNOSIS — C4371 Malignant melanoma of right lower limb, including hip: Secondary | ICD-10-CM | POA: Insufficient documentation

## 2022-07-30 ENCOUNTER — Ambulatory Visit: Payer: Medicaid Other | Admitting: General Surgery

## 2022-08-05 ENCOUNTER — Encounter: Payer: Self-pay | Admitting: General Surgery

## 2022-08-05 ENCOUNTER — Ambulatory Visit: Payer: Medicaid Other | Admitting: General Surgery

## 2022-08-05 VITALS — BP 113/79 | HR 67 | Temp 98.0°F | Resp 14 | Ht 62.0 in | Wt 111.0 lb

## 2022-08-05 DIAGNOSIS — C4371 Malignant melanoma of right lower limb, including hip: Secondary | ICD-10-CM

## 2022-08-06 NOTE — Progress Notes (Signed)
Donna Munoz; [id number]; [birth year]   HPI Patient is a 46 year old white female who was referred to my care by oncology for Port-A-Cath removal.  She had a Port-A-Cath placed in the past for melanoma of the right great toe.  She has finished chemotherapy and presents for scheduling of Port-A-Cath removal. Past Medical History:  Diagnosis Date   Anemia    Anxiety    Depression    GERD (gastroesophageal reflux disease)    Headache    Shingles    Tobacco abuse 05/06/2020    Past Surgical History:  Procedure Laterality Date   AMPUTATION TOE Right 03/07/2020   Procedure: RIGHT THIRD TOE AMPUTATION;  Surgeon: [name], MD;  Location: [place];  Service: General;  Laterality: Right;   BREAST LUMPECTOMY  age 75   CESAREAN SECTION  01/06/2006   CESAREAN SECTION  10/15/2016   DENTAL SURGERY  2019   INGUINAL HERNIA REPAIR Right 01/30/2020   Procedure: RIGHT INGUINAL HERNIA REPAIR WITH  MESH;  Surgeon: [name], MD;  Location: [place];  Service: General;  Laterality: Right;   INSERTION OF MESH Right 01/30/2020   Procedure: INSERTION OF MESH;  Surgeon: [name], MD;  Location: [place];  Service: General;  Laterality: Right;   IR CV LINE INJECTION  09/11/2020   IR IMAGING GUIDED PORT INSERTION  09/12/2020   IR REMOVAL TUN ACCESS W/ PORT W/O FL MOD SED  09/12/2020   IRRIGATION AND DEBRIDEMENT ABSCESS Right 03/07/2020   Procedure: Aspiration of Right Groin Seroma;  Surgeon: [name], MD;  Location: [place];  Service: General;  Laterality: Right;   MELANOMA EXCISION WITH SENTINEL LYMPH NODE BIOPSY Right 01/30/2020   Procedure: WIDE LOWER EXCISION RIGHT THIRD TOE MELANOMA EXCISION WITH SENTINEL LYMPH NODE BIOPSY;  Surgeon: [name], MD;  Location: [place];  Service: General;  Laterality: Right;   PORTACATH PLACEMENT N/A 03/07/2020   Procedure: INSERTION PORT-A-CATH;  Surgeon: [name], MD;  Location: [place];  Service: General;   Laterality: Left    Family History  Problem Relation Age of Onset   Heart disease Mother    Cancer Mother    Ovarian cysts Sister    Healthy Brother    Cancer Maternal Grandmother    Heart attack Maternal Grandmother    Cancer Maternal Grandfather    Cancer Paternal Grandmother    Healthy Sister    Healthy Sister    Psoriasis Daughter    Healthy Son     Current Outpatient Medications on File Prior to Visit  Medication Sig Dispense Refill   levothyroxine (SYNTHROID) 25 MCG tablet Take 1 tablet (25 mcg total) by mouth daily before breakfast. 30 tablet 6   lidocaine-prilocaine (EMLA) cream Apply a small amount to port a cath site (do not rub in) and cover with plastic wrap 1 hour prior to chemotherapy appointments 30 g 3   ondansetron (ZOFRAN ODT) 4 MG disintegrating tablet Take 1 tablet (4 mg total) by mouth every 8 (eight) hours as needed for nausea or vomiting. 30 tablet 0   No current facility-administered medications on file prior to visit.    Allergies  Allergen Reactions   Gadavist [Gadobutrol] Nausea Only    Mild nausea but no vomiting after injection.  Ok after 5 minutes.    Social History   Substance and Sexual Activity  Alcohol Use No    Social History   Tobacco Use  Smoking Status Every Day   Packs/day: 1.00  Types: Cigarettes  Smokeless Tobacco Never    Review of Systems  Constitutional: Negative.   HENT: Negative.    Eyes: Negative.   Respiratory: Negative.    Cardiovascular: Negative.   Gastrointestinal: Negative.   Genitourinary: Negative.   Musculoskeletal: Negative.   Skin: Negative.   Neurological: Negative.   Endo/Heme/Allergies: Negative.   Psychiatric/Behavioral: Negative.      Objective   Vitals:   08/05/22 1605  BP: 113/79  Pulse: 67  Resp: 14  Temp: 98 F (36.7 C)  SpO2: 97%    Physical Exam Vitals reviewed.  Constitutional:      Appearance: Normal appearance. She is normal weight. She is not ill-appearing.   Cardiovascular:     Rate and Rhythm: Normal rate and regular rhythm.     Heart sounds: Normal heart sounds. No murmur heard.    No friction rub. No gallop.     Comments: Port-A-Cath in place left upper chest with IJ entrance Pulmonary:     Effort: Pulmonary effort is normal. No respiratory distress.     Breath sounds: Normal breath sounds. No stridor. No wheezing, rhonchi or rales.  Skin:    General: Skin is warm and dry.  Neurological:     Mental Status: She is alert and oriented to person, place, and time.     Assessment  Melanoma of right great toe, finished with chemotherapy Plan  Patient is scheduled for Port-A-Cath removal in the minor procedure room on 08/12/2022.  The risks and benefits of the procedure were fully explained to the patient, who gave informed consent.

## 2022-08-06 NOTE — H&P (Signed)
Donna Munoz; [id number]; Mar 16, 1976   HPI Patient is a 46 year old white female who was referred to my care by oncology for Port-A-Cath removal.  She had a Port-A-Cath placed in the past for melanoma of the right great toe.  She has finished chemotherapy and presents for scheduling of Port-A-Cath removal. Past Medical History:  Diagnosis Date   Anemia    Anxiety    Depression    GERD (gastroesophageal reflux disease)    Headache    Shingles    Tobacco abuse 05/06/2020    Past Surgical History:  Procedure Laterality Date   AMPUTATION TOE Right 03/07/2020   Procedure: RIGHT THIRD TOE AMPUTATION;  Surgeon: [name], MD;  Location: [place];  Service: General;  Laterality: Right;   BREAST LUMPECTOMY  age 60   CESAREAN SECTION  01/06/2006   CESAREAN SECTION  10/15/2016   DENTAL SURGERY  2019   INGUINAL HERNIA REPAIR Right 01/30/2020   Procedure: RIGHT INGUINAL HERNIA REPAIR WITH  MESH;  Surgeon: [name], MD;  Location: [place];  Service: General;  Laterality: Right;   INSERTION OF MESH Right 01/30/2020   Procedure: INSERTION OF MESH;  Surgeon: [name], MD;  Location: [place];  Service: General;  Laterality: Right;   IR CV LINE INJECTION  09/11/2020   IR IMAGING GUIDED PORT INSERTION  09/12/2020   IR REMOVAL TUN ACCESS W/ PORT W/O FL MOD SED  09/12/2020   IRRIGATION AND DEBRIDEMENT ABSCESS Right 03/07/2020   Procedure: Aspiration of Right Groin Seroma;  Surgeon: [name], MD;  Location: [place];  Service: General;  Laterality: Right;   MELANOMA EXCISION WITH SENTINEL LYMPH NODE BIOPSY Right 01/30/2020   Procedure: WIDE LOWER EXCISION RIGHT THIRD TOE MELANOMA EXCISION WITH SENTINEL LYMPH NODE BIOPSY;  Surgeon: [name], MD;  Location: [place];  Service: General;  Laterality: Right;   PORTACATH PLACEMENT N/A 03/07/2020   Procedure: INSERTION PORT-A-CATH;  Surgeon: [name], MD;  Location: [place];  Service: General;   Laterality: Left    Family History  Problem Relation Age of Onset   Heart disease Mother    Cancer Mother    Ovarian cysts Sister    Healthy Brother    Cancer Maternal Grandmother    Heart attack Maternal Grandmother    Cancer Maternal Grandfather    Cancer Paternal Grandmother    Healthy Sister    Healthy Sister    Psoriasis Daughter    Healthy Son     Current Outpatient Medications on File Prior to Visit  Medication Sig Dispense Refill   levothyroxine (SYNTHROID) 25 MCG tablet Take 1 tablet (25 mcg total) by mouth daily before breakfast. 30 tablet 6   lidocaine-prilocaine (EMLA) cream Apply a small amount to port a cath site (do not rub in) and cover with plastic wrap 1 hour prior to chemotherapy appointments 30 g 3   ondansetron (ZOFRAN ODT) 4 MG disintegrating tablet Take 1 tablet (4 mg total) by mouth every 8 (eight) hours as needed for nausea or vomiting. 30 tablet 0   No current facility-administered medications on file prior to visit.    Allergies  Allergen Reactions   Gadavist [Gadobutrol] Nausea Only    Mild nausea but no vomiting after injection.  Ok after 5 minutes.    Social History   Substance and Sexual Activity  Alcohol Use No    Social History   Tobacco Use  Smoking Status Every Day   Packs/day: 1.00  Types: Cigarettes  Smokeless Tobacco Never    Review of Systems  Constitutional: Negative.   HENT: Negative.    Eyes: Negative.   Respiratory: Negative.    Cardiovascular: Negative.   Gastrointestinal: Negative.   Genitourinary: Negative.   Musculoskeletal: Negative.   Skin: Negative.   Neurological: Negative.   Endo/Heme/Allergies: Negative.   Psychiatric/Behavioral: Negative.      Objective   Vitals:   08/05/22 1605  BP: 113/79  Pulse: 67  Resp: 14  Temp: 98 F (36.7 C)  SpO2: 97%    Physical Exam Vitals reviewed.  Constitutional:      Appearance: Normal appearance. She is normal weight. She is not ill-appearing.   Cardiovascular:     Rate and Rhythm: Normal rate and regular rhythm.     Heart sounds: Normal heart sounds. No murmur heard.    No friction rub. No gallop.     Comments: Port-A-Cath in place left upper chest with IJ entrance Pulmonary:     Effort: Pulmonary effort is normal. No respiratory distress.     Breath sounds: Normal breath sounds. No stridor. No wheezing, rhonchi or rales.  Skin:    General: Skin is warm and dry.  Neurological:     Mental Status: She is alert and oriented to person, place, and time.     Assessment  Melanoma of right great toe, finished with chemotherapy Plan  Patient is scheduled for Port-A-Cath removal in the minor procedure room on 08/12/2022.  The risks and benefits of the procedure were fully explained to the patient, who gave informed consent.

## 2022-08-12 ENCOUNTER — Ambulatory Visit (HOSPITAL_COMMUNITY)
Admission: RE | Admit: 2022-08-12 | Discharge: 2022-08-12 | Disposition: A | Discharge status: Home / Self Care | Payer: Medicaid Other | Source: Ambulatory Visit | Attending: General Surgery | Admitting: General Surgery

## 2022-08-12 ENCOUNTER — Encounter (HOSPITAL_COMMUNITY)
Admission: RE | Disposition: A | Discharge status: Home / Self Care | Payer: Self-pay | Source: Ambulatory Visit | Attending: General Surgery

## 2022-08-12 DIAGNOSIS — Z85820 Personal history of malignant melanoma of skin: Secondary | ICD-10-CM | POA: Diagnosis not present

## 2022-08-12 DIAGNOSIS — C439 Malignant melanoma of skin, unspecified: Secondary | ICD-10-CM

## 2022-08-12 DIAGNOSIS — Z9221 Personal history of antineoplastic chemotherapy: Secondary | ICD-10-CM | POA: Insufficient documentation

## 2022-08-12 HISTORY — PX: PORT-A-CATH REMOVAL: SHX5289

## 2022-08-12 SURGERY — MINOR REMOVAL PORT-A-CATH
Anesthesia: LOCAL

## 2022-08-12 MED ORDER — LIDOCAINE HCL (PF) 1 % IJ SOLN
INTRAMUSCULAR | Status: AC
  Filled 2022-08-12: qty 30

## 2022-08-12 MED ORDER — LIDOCAINE HCL (PF) 1 % IJ SOLN
INTRAMUSCULAR | Status: DC | PRN
  Administered 2022-08-12: 5 mL via SUBCUTANEOUS

## 2022-08-12 SURGICAL SUPPLY — 21 items
ADH SKN CLS APL DERMABOND .7 (GAUZE/BANDAGES/DRESSINGS) ×1
APL PRP STRL LF ISPRP CHG 10.5 (MISCELLANEOUS) ×1
APPLICATOR CHLORAPREP 10.5 ORG (MISCELLANEOUS) ×1 IMPLANT
CLOTH BEACON ORANGE TIMEOUT ST (SAFETY) ×1 IMPLANT
DECANTER SPIKE VIAL GLASS SM (MISCELLANEOUS) ×1 IMPLANT
DERMABOND ADVANCED .7 DNX12 (GAUZE/BANDAGES/DRESSINGS) ×1 IMPLANT
DRAPE HALF SHEET 40X57 (DRAPES) IMPLANT
ELECT REM PT RETURN 9FT ADLT (ELECTROSURGICAL) ×1
ELECTRODE REM PT RTRN 9FT ADLT (ELECTROSURGICAL) ×1 IMPLANT
GLOVE BIOGEL PI IND STRL 7.0 (GLOVE) ×2 IMPLANT
GLOVE SURG SS PI 7.5 STRL IVOR (GLOVE) ×1 IMPLANT
GOWN STRL REUS W/TWL LRG LVL3 (GOWN DISPOSABLE) IMPLANT
NDL HYPO 25X1 1.5 SAFETY (NEEDLE) ×1 IMPLANT
NEEDLE HYPO 25X1 1.5 SAFETY (NEEDLE) ×1 IMPLANT
PENCIL SMOKE EVACUATOR COATED (MISCELLANEOUS) IMPLANT
SPONGE GAUZE 2X2 8PLY STRL LF (GAUZE/BANDAGES/DRESSINGS) ×1 IMPLANT
SUT MNCRL AB 4-0 PS2 18 (SUTURE) ×1 IMPLANT
SUT VIC AB 3-0 SH 27 (SUTURE) ×1
SUT VIC AB 3-0 SH 27X BRD (SUTURE) ×1 IMPLANT
SYR CONTROL 10ML LL (SYRINGE) ×1 IMPLANT
TOWEL OR 17X26 4PK STRL BLUE (TOWEL DISPOSABLE) ×1 IMPLANT

## 2022-08-12 NOTE — Interval H&P Note (Signed)
History and Physical Interval Note:  08/12/2022 9:13 AM  Donna Munoz  has presented today for surgery, with the diagnosis of Port-in-place (Z95.828).  The various methods of treatment have been discussed with the patient and family. After consideration of risks, benefits and other options for treatment, the patient has consented to  Procedure(s): MINOR REMOVAL PORT-A-CATH (N/A) as a surgical intervention.  The patient's history has been reviewed, patient examined, no change in status, stable for surgery.  I have reviewed the patient's chart and labs.  Questions were answered to the patient's satisfaction.     Aviva Signs

## 2022-08-12 NOTE — Op Note (Signed)
Patient:  [name]  DOB:  February 22, 1976  MRN:  [id number]   Preop Diagnosis: Melanoma of foot, finished with chemotherapy  Postop Diagnosis: Same  Procedure: Port-A-Cath removal  Surgeon: Aviva Signs, MD  Anes: Local  Indications: Patient is a 46 year old white female who was referred to my care for removal of her Port-A-Cath.  She is finished with chemotherapy.  The risks and benefits of the procedure including bleeding and infection were fully explained to the patient, who gave informed consent.  Procedure note: The procedure was performed in the minor procedure room.  The left upper chest was prepped and draped using the usual sterile technique with ChloraPrep.  Surgical site confirmation was performed.  1% Xylocaine was used for local anesthesia.  An incision was made through the previous surgical incision site.  This was taken down to the port.  The Port-A-Cath was removed in total without difficulty.  It was disposed of.  The subcutaneous layer was reapproximated using a 3-0 Vicryl rapid suture.  The skin was closed using a 4-0 Monocryl subcuticular suture.  Dermabond was applied.  Patient was held in the left lower neck where the entrance of the catheter had been.  No hematoma was noted at the end.  The patient was discharged home from the minor procedure room in good and stable condition.  Complications: None  EBL: Minimal  Specimen: None

## 2022-08-12 NOTE — Progress Notes (Signed)
Awake. Coke given to drink. Tolerated well. Dermabond to incision dry. Ice pack applied. D/C instructions given voiced understanding. Dressed self. D/C to home in good condition.

## 2022-08-18 ENCOUNTER — Encounter (HOSPITAL_COMMUNITY): Payer: Self-pay | Admitting: General Surgery

## 2022-09-10 DIAGNOSIS — L814 Other melanin hyperpigmentation: Secondary | ICD-10-CM | POA: Diagnosis not present

## 2022-09-10 DIAGNOSIS — Z85820 Personal history of malignant melanoma of skin: Secondary | ICD-10-CM | POA: Diagnosis not present

## 2022-09-10 DIAGNOSIS — Z08 Encounter for follow-up examination after completed treatment for malignant neoplasm: Secondary | ICD-10-CM | POA: Diagnosis not present

## 2022-09-10 DIAGNOSIS — D225 Melanocytic nevi of trunk: Secondary | ICD-10-CM | POA: Diagnosis not present

## 2022-10-22 ENCOUNTER — Ambulatory Visit (HOSPITAL_COMMUNITY)
Admission: RE | Admit: 2022-10-22 | Discharge: 2022-10-22 | Disposition: A | Discharge status: Home / Self Care | Payer: Medicaid Other | Source: Ambulatory Visit | Attending: Hematology | Admitting: Hematology

## 2022-10-22 ENCOUNTER — Inpatient Hospital Stay: Payer: Medicaid Other | Attending: Hematology

## 2022-10-22 DIAGNOSIS — J929 Pleural plaque without asbestos: Secondary | ICD-10-CM | POA: Diagnosis not present

## 2022-10-22 DIAGNOSIS — E039 Hypothyroidism, unspecified: Secondary | ICD-10-CM | POA: Insufficient documentation

## 2022-10-22 DIAGNOSIS — K449 Diaphragmatic hernia without obstruction or gangrene: Secondary | ICD-10-CM | POA: Diagnosis not present

## 2022-10-22 DIAGNOSIS — Z79890 Hormone replacement therapy (postmenopausal): Secondary | ICD-10-CM | POA: Insufficient documentation

## 2022-10-22 DIAGNOSIS — Z85820 Personal history of malignant melanoma of skin: Secondary | ICD-10-CM | POA: Insufficient documentation

## 2022-10-22 DIAGNOSIS — Z79899 Other long term (current) drug therapy: Secondary | ICD-10-CM | POA: Insufficient documentation

## 2022-10-22 DIAGNOSIS — D500 Iron deficiency anemia secondary to blood loss (chronic): Secondary | ICD-10-CM

## 2022-10-22 DIAGNOSIS — Z9221 Personal history of antineoplastic chemotherapy: Secondary | ICD-10-CM | POA: Insufficient documentation

## 2022-10-22 DIAGNOSIS — F17210 Nicotine dependence, cigarettes, uncomplicated: Secondary | ICD-10-CM | POA: Diagnosis not present

## 2022-10-22 DIAGNOSIS — D509 Iron deficiency anemia, unspecified: Secondary | ICD-10-CM | POA: Insufficient documentation

## 2022-10-22 DIAGNOSIS — N200 Calculus of kidney: Secondary | ICD-10-CM | POA: Diagnosis not present

## 2022-10-22 DIAGNOSIS — C4371 Malignant melanoma of right lower limb, including hip: Secondary | ICD-10-CM

## 2022-10-22 DIAGNOSIS — J432 Centrilobular emphysema: Secondary | ICD-10-CM | POA: Diagnosis not present

## 2022-10-22 DIAGNOSIS — D649 Anemia, unspecified: Secondary | ICD-10-CM | POA: Insufficient documentation

## 2022-10-22 DIAGNOSIS — I700 Atherosclerosis of aorta: Secondary | ICD-10-CM | POA: Diagnosis not present

## 2022-10-22 LAB — IRON AND TIBC
Iron: 25 ug/dL — ABNORMAL LOW (ref 28–170)
Saturation Ratios: 6 % — ABNORMAL LOW (ref 10.4–31.8)
TIBC: 420 ug/dL (ref 250–450)
UIBC: 395 ug/dL

## 2022-10-22 LAB — CBC WITH DIFFERENTIAL/PLATELET
Abs Immature Granulocytes: 0.02 10*3/uL (ref 0.00–0.07)
Basophils Absolute: 0 10*3/uL (ref 0.0–0.1)
Basophils Relative: 1 %
Eosinophils Absolute: 0.1 10*3/uL (ref 0.0–0.5)
Eosinophils Relative: 3 %
HCT: 37.1 % (ref 36.0–46.0)
Hemoglobin: 11.9 g/dL — ABNORMAL LOW (ref 12.0–15.0)
Immature Granulocytes: 0 %
Lymphocytes Relative: 28 %
Lymphs Abs: 1.6 10*3/uL (ref 0.7–4.0)
MCH: 29.5 pg (ref 26.0–34.0)
MCHC: 32.1 g/dL (ref 30.0–36.0)
MCV: 91.8 fL (ref 80.0–100.0)
Monocytes Absolute: 0.3 10*3/uL (ref 0.1–1.0)
Monocytes Relative: 6 %
Neutro Abs: 3.6 10*3/uL (ref 1.7–7.7)
Neutrophils Relative %: 62 %
Platelets: 227 10*3/uL (ref 150–400)
RBC: 4.04 MIL/uL (ref 3.87–5.11)
RDW: 14.3 % (ref 11.5–15.5)
WBC: 5.7 10*3/uL (ref 4.0–10.5)
nRBC: 0 % (ref 0.0–0.2)

## 2022-10-22 LAB — COMPREHENSIVE METABOLIC PANEL
ALT: 13 U/L (ref 0–44)
AST: 24 U/L (ref 15–41)
Albumin: 3.8 g/dL (ref 3.5–5.0)
Alkaline Phosphatase: 61 U/L (ref 38–126)
Anion gap: 5 (ref 5–15)
BUN: 8 mg/dL (ref 6–20)
CO2: 24 mmol/L (ref 22–32)
Calcium: 8.9 mg/dL (ref 8.9–10.3)
Chloride: 108 mmol/L (ref 98–111)
Creatinine, Ser: 0.56 mg/dL (ref 0.44–1.00)
GFR, Estimated: >60 mL/min (ref >60)
Glucose, Bld: 92 mg/dL (ref 70–99)
Potassium: 4 mmol/L (ref 3.5–5.1)
Sodium: 137 mmol/L (ref 135–145)
Total Bilirubin: 0.6 mg/dL (ref 0.3–1.2)
Total Protein: 6.8 g/dL (ref 6.5–8.1)

## 2022-10-22 LAB — MAGNESIUM: Magnesium: 2 mg/dL (ref 1.7–2.4)

## 2022-10-22 LAB — LACTATE DEHYDROGENASE: LDH: 105 U/L (ref 98–192)

## 2022-10-22 LAB — FERRITIN: Ferritin: 5 ng/mL — ABNORMAL LOW (ref 11–307)

## 2022-10-22 LAB — TSH: TSH: 1.763 u[IU]/mL (ref 0.350–4.500)

## 2022-10-22 MED ORDER — IOHEXOL 300 MG/ML  SOLN
75.0000 mL | Freq: Once | INTRAMUSCULAR | Status: AC | PRN
  Administered 2022-10-22: 75 mL via INTRAVENOUS

## 2022-10-29 ENCOUNTER — Inpatient Hospital Stay (HOSPITAL_BASED_OUTPATIENT_CLINIC_OR_DEPARTMENT_OTHER): Payer: Medicaid Other | Admitting: Hematology

## 2022-10-29 VITALS — BP 110/78 | HR 65 | Temp 98.2°F | Resp 16 | Wt 111.6 lb

## 2022-10-29 DIAGNOSIS — D509 Iron deficiency anemia, unspecified: Secondary | ICD-10-CM

## 2022-10-29 DIAGNOSIS — Z85820 Personal history of malignant melanoma of skin: Secondary | ICD-10-CM | POA: Diagnosis not present

## 2022-10-29 DIAGNOSIS — C439 Malignant melanoma of skin, unspecified: Secondary | ICD-10-CM | POA: Diagnosis not present

## 2022-10-29 DIAGNOSIS — C4371 Malignant melanoma of right lower limb, including hip: Secondary | ICD-10-CM | POA: Diagnosis not present

## 2022-10-29 NOTE — Patient Instructions (Signed)
[facility]  Discharge Instructions  You were seen and examined today by Dr. Delton Coombes.  Dr. Delton Coombes discussed your most recent lab work and CT scan which revealed that your iron is low and that you have a kidney stone in your kidney.  Dr. Delton Coombes has recommended that you have IV iron for your low iron levels. He also recommends that you drink plenty of fluids to help prevent further kidney stone development.  Follow-up as scheduled in 6 months with labs and scan.    Thank you for choosing [facility] to provide your oncology and hematology care.   To afford each patient quality time with our provider, please arrive at least 15 minutes before your scheduled appointment time. You may need to reschedule your appointment if you arrive late (10 or more minutes). Arriving late affects you and other patients whose appointments are after yours.  Also, if you miss three or more appointments without notifying the office, you may be dismissed from the clinic at the provider's discretion.    Again, thank you for choosing [facility].  Our hope is that these requests will decrease the amount of time that you wait before being seen by our physicians.   If you have a lab appointment with the [facility] please come in thru the Main Entrance and check in at the main information desk.           _____________________________________________________________  Should you have questions after your visit to [facility], please contact our office at [phone] and follow the prompts.  Our office hours are 8:00 a.m. to 4:30 p.m. Monday - Thursday and 8:00 a.m. to 2:30 p.m. Friday.  Please note that voicemails left after 4:00 p.m. may not be returned until the following business day.  We are closed weekends and all major holidays.  You do have access to a nurse 24-7, just call the main number to the clinic [phone] and  do not press any options, hold on the line and a nurse will answer the phone.    For prescription refill requests, have your pharmacy contact our office and allow 72 hours.    Masks are optional in the cancer centers. If you would like for your care team to wear a mask while they are taking care of you, please let them know. You may have one support person who is at least 44 years old accompany you for your appointments.

## 2022-10-29 NOTE — Progress Notes (Signed)
[place],  [address]   CLINIC:  Medical Oncology/Hematology  PCP:  Patient, No Pcp Per None None   REASON FOR VISIT:  Follow-up for malignant melanoma  PRIOR THERAPY: Resection of right third toe on 01/30/2020 with right third toe amputation at PIP joint on 03/07/2020  NGS Results: BRAF V600E and V600K negative  CURRENT THERAPY: Adjuvant Keytruda every 3 weeks  BRIEF ONCOLOGIC HISTORY:  Oncology History  Melanoma of skin ([place])  12/11/2019 Initial Diagnosis   Melanoma of skin ([place])   02/19/2020 Genetic Testing   BRAF Mutation Analysis     03/25/2020 - 04/02/2021 Chemotherapy   Patient is on Treatment Plan : MELANOMA Pembrolizumab q21d       CANCER STAGING:  Cancer Staging  Melanoma of skin ([place]) Staging form: Melanoma of the Skin, AJCC 8th Edition - Clinical stage from 02/08/2020: Stage III (cT2b, cN2a, cM0) - Unsigned   INTERVAL HISTORY:  [name], a 46 y.o. female, seen for follow-up of malignant melanoma.  Reports pain in the right lower back region for the last 3 months, which is worse on standing and better on sitting.  Port was discontinued on 08/12/2022.  She also quit taking Synthroid.  She also has intermittent epigastric pain, burning type.  She has numbness in the right leg, lower part since she had toe amputation.  REVIEW OF SYSTEMS:  Review of Systems  Gastrointestinal:  Positive for abdominal pain (Epigastric) and nausea.  Neurological:  Positive for dizziness and headaches.  Psychiatric/Behavioral:  Positive for depression and sleep disturbance.   All other systems reviewed and are negative.   PAST MEDICAL/SURGICAL HISTORY:  Past Medical History:  Diagnosis Date   Anemia    Anxiety    Depression    GERD (gastroesophageal reflux disease)    Headache    Shingles    Tobacco abuse 05/06/2020   Past Surgical History:  Procedure Laterality Date   AMPUTATION TOE Right 03/07/2020   Procedure: RIGHT  THIRD TOE AMPUTATION;  Surgeon: [name], MD;  Location: [place] [facility];  Service: General;  Laterality: Right;   BREAST LUMPECTOMY  age 72   CESAREAN SECTION  01/06/2006   CESAREAN SECTION  10/15/2016   DENTAL SURGERY  2019   INGUINAL HERNIA REPAIR Right 01/30/2020   Procedure: RIGHT INGUINAL HERNIA REPAIR WITH  MESH;  Surgeon: [name], MD;  Location: [place];  Service: General;  Laterality: Right;   INSERTION OF MESH Right 01/30/2020   Procedure: INSERTION OF MESH;  Surgeon: [name], MD;  Location: [place];  Service: General;  Laterality: Right;   IR CV LINE INJECTION  09/11/2020   IR IMAGING GUIDED PORT INSERTION  09/12/2020   IR REMOVAL TUN ACCESS W/ PORT W/O FL MOD SED  09/12/2020   IRRIGATION AND DEBRIDEMENT ABSCESS Right 03/07/2020   Procedure: Aspiration of Right Groin Seroma;  Surgeon: [name], MD;  Location: [place];  Service: General;  Laterality: Right;   MELANOMA EXCISION WITH SENTINEL LYMPH NODE BIOPSY Right 01/30/2020   Procedure: WIDE LOWER EXCISION RIGHT THIRD TOE MELANOMA EXCISION WITH SENTINEL LYMPH NODE BIOPSY;  Surgeon: [name], MD;  Location: [facility];  Service: General;  Laterality: Right;   PORT-A-CATH REMOVAL N/A 08/12/2022   Procedure: MINOR REMOVAL PORT-A-CATH;  Surgeon: Aviva Signs, MD;  Location: [facility];  Service: General;  Laterality: N/A;   PORTACATH PLACEMENT N/A 03/07/2020   Procedure: INSERTION PORT-A-CATH;  Surgeon: Barry Dienes,  Dorris Fetch, MD;  Location: [facility];  Service: General;  Laterality: Left    SOCIAL HISTORY:  Social History   Socioeconomic History   Marital status: Legally Separated    Spouse name: Not on file   Number of children: 2   Years of education: Not on file   Highest education level: Not on file  Occupational History   Occupation: unemployed d/t COVID  Tobacco Use   Smoking status: Every Day    Packs/day: 1.00    Types: Cigarettes   Smokeless tobacco: Never  Vaping Use    Vaping Use: Never used  Substance and Sexual Activity   Alcohol use: No   Drug use: Yes    Types: Marijuana   Sexual activity: Yes  Other Topics Concern   Not on file  Social History Narrative   Not on file   Social Determinants of Health   Financial Resource Strain: High Risk (12/08/2019)   Overall Financial Resource Strain (CARDIA)    Difficulty of Paying Living Expenses: Hard  Food Insecurity: No Food Insecurity (12/08/2019)   Hunger Vital Sign    Worried About Running Out of Food in the Last Year: Never true    Ran Out of Food in the Last Year: Never true  Transportation Needs: No Transportation Needs (12/26/2020)   PRAPARE - Hydrologist (Medical): No    Lack of Transportation (Non-Medical): No  Physical Activity: Inactive (12/26/2020)   Exercise Vital Sign    Days of Exercise per Week: 0 days    Minutes of Exercise per Session: 0 min  Stress: Stress Concern Present (12/08/2019)   [place]    Feeling of Stress : Very much  Social Connections: Socially Isolated (12/08/2019)   Social Connection and Isolation Panel [NHANES]    Frequency of Communication with Friends and Family: Twice a week    Frequency of Social Gatherings with Friends and Family: Never    Attends Religious Services: 1 to 4 times per year    Active Member of [employer] or Organizations: No    Attends Archivist Meetings: Never    Marital Status: Separated  Intimate Partner Violence: Not At Risk (12/26/2020)   Humiliation, Afraid, Rape, and Kick questionnaire    Fear of Current or Ex-Partner: No    Emotionally Abused: No    Physically Abused: No    Sexually Abused: No    FAMILY HISTORY:  Family History  Problem Relation Age of Onset   Heart disease Mother    Cancer Mother    Ovarian cysts Sister    Healthy Brother    Cancer Maternal Grandmother    Heart attack Maternal Grandmother    Cancer Maternal  Grandfather    Cancer Paternal Grandmother    Healthy Sister    Healthy Sister    Psoriasis Daughter    Healthy Son     CURRENT MEDICATIONS:  Current Outpatient Medications  Medication Sig Dispense Refill   gabapentin (NEURONTIN) 100 MG capsule Take 100 mg by mouth 3 (three) times daily as needed.     levothyroxine (SYNTHROID) 25 MCG tablet Take 1 tablet (25 mcg total) by mouth daily before breakfast. 30 tablet 6   ondansetron (ZOFRAN ODT) 4 MG disintegrating tablet Take 1 tablet (4 mg total) by mouth every 8 (eight) hours as needed for nausea or vomiting. 30 tablet 0   lidocaine-prilocaine (EMLA) cream Apply a small amount to port a cath site (  do not rub in) and cover with plastic wrap 1 hour prior to chemotherapy appointments (Patient not taking: Reported on 10/29/2022) 30 g 3   No current facility-administered medications for this visit.    ALLERGIES:  Allergies  Allergen Reactions   Gadavist [Gadobutrol] Nausea Only    Mild nausea but no vomiting after injection.  Ok after 5 minutes.    PHYSICAL EXAM:  Performance status (ECOG): 0 - Asymptomatic  Vitals:   10/29/22 1408  BP: 110/78  Pulse: 65  Resp: 16  Temp: 98.2 F (36.8 C)  SpO2: 100%   Wt Readings from Last 3 Encounters:  10/29/22 111 lb 9.6 oz (50.6 kg)  08/05/22 111 lb (50.3 kg)  04/30/22 113 lb 11.2 oz (51.6 kg)   Physical Exam Vitals reviewed.  Constitutional:      Appearance: Normal appearance.  Cardiovascular:     Rate and Rhythm: Normal rate and regular rhythm.     Pulses: Normal pulses.     Heart sounds: Normal heart sounds.  Pulmonary:     Effort: Pulmonary effort is normal.     Breath sounds: Normal breath sounds.  Chest:     Chest wall: Tenderness (L anterior axillary) present.  Lymphadenopathy:     Upper Body:     Right upper body: No supraclavicular or axillary adenopathy.     Left upper body: No supraclavicular or axillary adenopathy.  Neurological:     General: No focal deficit  present.     Mental Status: She is alert and oriented to person, place, and time.  Psychiatric:        Mood and Affect: Mood normal.        Behavior: Behavior normal.      LABORATORY DATA:  I have reviewed the labs as listed.     Latest Ref Rng & Units 10/22/2022   10:09 AM 04/24/2022    9:20 AM 01/15/2022   10:19 AM  CBC  WBC 4.0 - 10.5 K/uL 5.7  5.4  3.8   Hemoglobin 12.0 - 15.0 g/dL 11.9  13.3  13.2   Hematocrit 36.0 - 46.0 % 37.1  39.3  40.0   Platelets 150 - 400 K/uL 227  219  181       Latest Ref Rng & Units 10/22/2022   10:09 AM 04/24/2022    9:20 AM 01/15/2022   10:19 AM  CMP  Glucose 70 - 99 mg/dL 92  85  95   BUN 6 - 20 mg/dL _0 Creatinine 0.44 - 1.00 mg/dL 0.56  0.62  0.63   Sodium 135 - 145 mmol/L 137  137  137   Potassium 3.5 - 5.1 mmol/L 4.0  3.5  3.9   Chloride 98 - 111 mmol/L 108  107  104   CO2 22 - 32 mmol/L _1 Calcium 8.9 - 10.3 mg/dL 8.9  9.3  9.5   Total Protein 6.5 - 8.1 g/dL 6.8  6.8  7.1   Total Bilirubin 0.3 - 1.2 mg/dL 0.6  0.4  0.2   Alkaline Phos 38 - 126 U/L 61  66  61   AST 15 - 41 U/L _2 ALT 0 - 44 U/L _3 DIAGNOSTIC IMAGING:  I have independently reviewed the scans and discussed with the patient. CT CHEST ABDOMEN PELVIS W CONTRAST  Result Date: 10/22/2022 CLINICAL DATA:  History  of melanoma with metastases to the right groin and lymph node status post immunotherapy. Follow-up/surveillance. * Tracking Code: BO * EXAM: CT CHEST, ABDOMEN, AND PELVIS WITH CONTRAST TECHNIQUE: Multidetector CT imaging of the chest, abdomen and pelvis was performed following the standard protocol during bolus administration of intravenous contrast. RADIATION DOSE REDUCTION: This exam was performed according to the departmental dose-optimization program which includes automated exposure control, adjustment of the mA and/or kV according to patient size and/or use of iterative reconstruction technique. CONTRAST:  18m OMNIPAQUE  IOHEXOL 300 MG/ML  SOLN COMPARISON:  Multiple priors including most recent CT chest abdomen pelvis dated April 24, 2022 FINDINGS: CT CHEST FINDINGS Cardiovascular: Interval removal of the left chest Port-A-Cath. Normal caliber thoracic aorta. No central pulmonary embolus on this nondedicated study. Normal size heart. No significant pericardial effusion/thickening. Mediastinum/Nodes: No supraclavicular adenopathy. No suspicious thyroid nodule. No pathologically enlarged mediastinal, hilar or axillary lymph nodes. Mild symmetric distal esophageal wall thickening. Lungs/Pleura: Centrilobular emphysema. Mild diffuse bronchial wall thickening. No suspicious pulmonary nodules or masses. No pleural effusion. No pneumothorax. Musculoskeletal: No aggressive lytic or blastic lesion of bone. CT ABDOMEN PELVIS FINDINGS Hepatobiliary: Similar tiny bilobar hepatic hypodensities which are technically too small to accurately characterize but favored benign given stability dating back to at least January 14, 2021. Gallbladder is unremarkable. No biliary ductal dilation. Pancreas: No pancreatic ductal dilation or evidence of acute inflammation. Spleen: No splenomegaly or focal splenic lesion. Adrenals/Urinary Tract: Bilateral adrenal glands appear normal. No hydronephrosis. Kidneys demonstrate symmetric enhancement. Punctate nonobstructive left lower pole renal stone. Urinary bladder is unremarkable for degree of distension. Stomach/Bowel: Tiny hiatal hernia otherwise the stomach is unremarkable for degree of distension. No pathologic dilation of small or large bowel. No evidence of acute bowel inflammation. Moderate volume of formed stool throughout the colon. Vascular/Lymphatic: Aortic atherosclerosis. Smooth IVC contours. No pathologically enlarged abdominal or pelvic lymph nodes. Reproductive: Uterus and bilateral adnexa are unremarkable in CT appearance for reproductive age female. Other: Trace pelvic free fluid is within  physiologic normal limits. Surgical clips in the right inguinal canal. Musculoskeletal: No aggressive lytic or blastic lesion of bone. IMPRESSION: 1. No evidence of metastatic disease within the chest, abdomen, or pelvis. 2. Tiny hiatal hernia with mild symmetric distal esophageal wall thickening, suggestive of reflux esophagitis. 3. Moderate volume of formed stool throughout the colon. Correlate for constipation. 4. Punctate nonobstructive left lower pole renal stone. 5. Aortic Atherosclerosis (ICD10-I70.0) and Emphysema (ICD10-J43.9). Electronically Signed   By: JDahlia BailiffM.D.   On: 10/22/2022 13:54     ASSESSMENT:  1.  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 -She underwent right third toe amputation at PIP level on 03/07/2020 for positive margins.  Pathology did not show any residual melanoma. -CT chest and abdomen on 03/21/2020 did not show any evidence of metastatic disease. -Adjuvant immunotherapy with pembrolizumab 1 year recommended. -Pembrolizumab started on 03/25/2020. -CT CAP on 06/12/2020 did not show any evidence of metastatic disease in the chest, abdomen or pelvis.  Mild emphysematous changes and respiratory bronchiolitis. -CT right foot with contrast on 06/13/2020 shows no evidence of osteomyelitis, abscess, septic arthritis or myositis. -MRI of the brain on 08/19/2020 did not show any evidence of metastatic disease. -CT CAP on 09/25/2020 did not show any evidence of metastatic disease.   2.  Family history: -Mother  died of throat cancer.  Maternal grandmother had breast cancer.  Maternal aunt had pancreatic cancer.  Maternal grandfather had bone cancer.  Maternal great grandmother died of melanoma. -She will benefit from genetic testing.   3.  Right thyroid nodule: -PET scan on  12/20/2019 showed uptake in the right thyroid. -Ultrasound on 02/05/2020 did not show any suspicious nodules. -FNA on 05/01/2020 showed benign follicular nodule, Bethesda category 2.   PLAN:  1.  Stage IIIb (T2B N2A) malignant melanoma of the right third toe, BRAF V6 100 E-: -Reviewed labs from 10/22/2022 which showed normal LFTs, LDH and CBC. - CT CAP (10/22/2022): No evidence of metastatic disease.  Tiny hiatal hernia with mild symmetric distal esophageal wall thickening suggestive of reflux esophagitis.  Left lower pole kidney stone.  Other benign findings were discussed. - Epigastric pain is likely from reflux esophagitis.  I have recommended dietary modifications and use of H2 blocker. - Right lower back pain for the past 3 months, worse on standing and better on sitting is most likely musculoskeletal.  She was told to call as her PMD if the pain gets worse. - Recommend follow-up in 6 months with repeat labs and scan.   2.  Normocytic anemia: - Last Venofer was on 11/24/2021.  She had reactions with the knee pains after all infusions.  She also had vaginal bleeding after first infusion.  Her ferritin is low at 5.  She is severely fatigued.  I have recommended parenteral iron.  However we will give Feraheme x 2 due to her reaction with Venofer.   3.  Hypothyroidism: - She stopped taking Synthroid at last visit.  TSH on 10/22/2022 was 1.7.   Orders placed this encounter:  Orders Placed This Encounter  Procedures   CT CHEST ABDOMEN PELVIS W CONTRAST   CBC with Differential/Platelet   Comprehensive metabolic panel   Lactate dehydrogenase   Ferritin   Iron and TIBC      Derek Jack, MD [place] [phone]

## 2022-11-05 ENCOUNTER — Inpatient Hospital Stay: Payer: Medicaid Other

## 2022-11-05 VITALS — BP 101/77 | HR 16 | Temp 97.6°F | Resp 16

## 2022-11-05 DIAGNOSIS — D509 Iron deficiency anemia, unspecified: Secondary | ICD-10-CM

## 2022-11-05 DIAGNOSIS — Z85820 Personal history of malignant melanoma of skin: Secondary | ICD-10-CM | POA: Diagnosis not present

## 2022-11-05 MED ORDER — SODIUM CHLORIDE 0.9 % IV SOLN
510.0000 mg | Freq: Once | INTRAVENOUS | Status: AC
  Administered 2022-11-05: 510 mg via INTRAVENOUS
  Filled 2022-11-05: qty 17

## 2022-11-05 MED ORDER — SODIUM CHLORIDE 0.9 % IV SOLN: 8.0000 mg | Freq: Once | INTRAVENOUS | Status: DC

## 2022-11-05 MED ORDER — ONDANSETRON HCL 4 MG/2ML IJ SOLN
8.0000 mg | Freq: Once | INTRAMUSCULAR | Status: AC
  Administered 2022-11-05: 8 mg via INTRAVENOUS
  Filled 2022-11-05: qty 4

## 2022-11-05 MED ORDER — METHYLPREDNISOLONE SODIUM SUCC 125 MG IJ SOLR
125.0000 mg | Freq: Once | INTRAMUSCULAR | Status: AC
  Administered 2022-11-05: 125 mg via INTRAVENOUS
  Filled 2022-11-05: qty 2

## 2022-11-05 MED ORDER — SODIUM CHLORIDE 0.9 % IV SOLN: Freq: Once | INTRAVENOUS | Status: AC

## 2022-11-05 NOTE — Patient Instructions (Signed)
[facility]  Discharge Instructions: Thank you for choosing [facility] to provide your oncology and hematology care.  If you have a lab appointment with the Cancer Center, please come in thru the Main Entrance and check in at the main information desk.  Wear comfortable clothing and clothing appropriate for easy access to any Portacath or PICC line.   We strive to give you quality time with your provider. You may need to reschedule your appointment if you arrive late (15 or more minutes).  Arriving late affects you and other patients whose appointments are after yours.  Also, if you miss three or more appointments without notifying the office, you may be dismissed from the clinic at the provider's discretion.      For prescription refill requests, have your pharmacy contact our office and allow 72 hours for refills to be completed.    To help prevent nausea and vomiting after your treatment, we encourage you to take your nausea medication as directed.  BELOW ARE SYMPTOMS THAT SHOULD BE REPORTED IMMEDIATELY: *FEVER GREATER THAN 100.4 F (38 C) OR HIGHER *CHILLS OR SWEATING *NAUSEA AND VOMITING THAT IS NOT CONTROLLED WITH YOUR NAUSEA MEDICATION *UNUSUAL SHORTNESS OF BREATH *UNUSUAL BRUISING OR BLEEDING *URINARY PROBLEMS (pain or burning when urinating, or frequent urination) *BOWEL PROBLEMS (unusual diarrhea, constipation, pain near the anus) TENDERNESS IN MOUTH AND THROAT WITH OR WITHOUT PRESENCE OF ULCERS (sore throat, sores in mouth, or a toothache) UNUSUAL RASH, SWELLING OR PAIN  UNUSUAL VAGINAL DISCHARGE OR ITCHING   Items with * indicate a potential emergency and should be followed up as soon as possible or go to the Emergency Department if any problems should occur.  Please show the CHEMOTHERAPY ALERT CARD or IMMUNOTHERAPY ALERT CARD at check-in to the Emergency Department and triage nurse.  Should you have questions after your visit or need to  cancel or reschedule your appointment, please contact [facility] [phone]  and follow the prompts.  Office hours are 8:00 a.m. to 4:30 p.m. Monday - Friday. Please note that voicemails left after 4:00 p.m. may not be returned until the following business day.  We are closed weekends and major holidays. You have access to a nurse at all times for urgent questions. Please call the main number to the clinic [phone] and follow the prompts.  For any non-urgent questions, you may also contact your provider using MyChart. We now offer e-Visits for anyone 18 and older to request care online for non-urgent symptoms. For details visit [email or web address].   Also download the MyChart app! Go to the app store, search "MyChart", open the app, select [facility], and log in with your MyChart username and password.   

## 2022-11-05 NOTE — Progress Notes (Signed)
Patient tolerated iron infusion with no complaints voiced.  Peripheral IV site clean and dry with good blood return noted before and after infusion.  Band aid applied.  Patient used the bathroom after iron infusion and blood noted in toilet.  No clots.  Patient stated this happened with last iron infusion and discussed with the oncologist on her last office visit.  Patient denied abdominal pain and her last period was two weeks ago.  Instructed the patient to call her gynecology doctor with understanding verbalized.  VSS with discharge and left in satisfactory condition with no s/s of distress noted.

## 2022-11-12 ENCOUNTER — Inpatient Hospital Stay: Payer: Medicaid Other | Attending: Hematology

## 2022-11-12 VITALS — BP 97/68 | HR 78 | Temp 97.2°F | Resp 18

## 2022-11-12 DIAGNOSIS — D509 Iron deficiency anemia, unspecified: Secondary | ICD-10-CM | POA: Insufficient documentation

## 2022-11-12 DIAGNOSIS — Z85820 Personal history of malignant melanoma of skin: Secondary | ICD-10-CM | POA: Diagnosis not present

## 2022-11-12 MED ORDER — METHYLPREDNISOLONE SODIUM SUCC 125 MG IJ SOLR
125.0000 mg | Freq: Once | INTRAMUSCULAR | Status: AC
  Administered 2022-11-12: 125 mg via INTRAVENOUS
  Filled 2022-11-12: qty 2

## 2022-11-12 MED ORDER — SODIUM CHLORIDE 0.9 % IV SOLN: 8.0000 mg | Freq: Once | INTRAVENOUS | Status: DC

## 2022-11-12 MED ORDER — SODIUM CHLORIDE 0.9 % IV SOLN
510.0000 mg | Freq: Once | INTRAVENOUS | Status: AC
  Administered 2022-11-12: 510 mg via INTRAVENOUS
  Filled 2022-11-12: qty 17

## 2022-11-12 MED ORDER — SODIUM CHLORIDE 0.9 % IV SOLN: Freq: Once | INTRAVENOUS | Status: AC

## 2022-11-12 MED ORDER — ONDANSETRON HCL 4 MG/2ML IJ SOLN
8.0000 mg | Freq: Once | INTRAMUSCULAR | Status: AC
  Administered 2022-11-12: 8 mg via INTRAVENOUS
  Filled 2022-11-12: qty 4

## 2022-11-12 NOTE — Progress Notes (Signed)
Patient presents today for Feraheme infusion per providers order.  Vital signs WNL.  Patient has no new complaints at this time.  Peripheral IV started and blood return noted pre and post infusion.  Treatment given today per MD orders.  Stable during infusion without adverse affects.  Vital signs stable.  No complaints at this time.  Discharge from clinic ambulatory in stable condition.  Alert and oriented X 3.  Follow up with [facility] as scheduled.

## 2022-11-12 NOTE — Patient Instructions (Signed)
[facility]  Discharge Instructions: Thank you for choosing [facility] to provide your oncology and hematology care.  If you have a lab appointment with the Cancer Center, please come in thru the Main Entrance and check in at the main information desk.  Wear comfortable clothing and clothing appropriate for easy access to any Portacath or PICC line.   We strive to give you quality time with your provider. You may need to reschedule your appointment if you arrive late (15 or more minutes).  Arriving late affects you and other patients whose appointments are after yours.  Also, if you miss three or more appointments without notifying the office, you may be dismissed from the clinic at the provider's discretion.      For prescription refill requests, have your pharmacy contact our office and allow 72 hours for refills to be completed.    Today you received the following chemotherapy and/or immunotherapy agents feraheme      To help prevent nausea and vomiting after your treatment, we encourage you to take your nausea medication as directed.  BELOW ARE SYMPTOMS THAT SHOULD BE REPORTED IMMEDIATELY: *FEVER GREATER THAN 100.4 F (38 C) OR HIGHER *CHILLS OR SWEATING *NAUSEA AND VOMITING THAT IS NOT CONTROLLED WITH YOUR NAUSEA MEDICATION *UNUSUAL SHORTNESS OF BREATH *UNUSUAL BRUISING OR BLEEDING *URINARY PROBLEMS (pain or burning when urinating, or frequent urination) *BOWEL PROBLEMS (unusual diarrhea, constipation, pain near the anus) TENDERNESS IN MOUTH AND THROAT WITH OR WITHOUT PRESENCE OF ULCERS (sore throat, sores in mouth, or a toothache) UNUSUAL RASH, SWELLING OR PAIN  UNUSUAL VAGINAL DISCHARGE OR ITCHING   Items with * indicate a potential emergency and should be followed up as soon as possible or go to the Emergency Department if any problems should occur.  Please show the CHEMOTHERAPY ALERT CARD or IMMUNOTHERAPY ALERT CARD at check-in to the  Emergency Department and triage nurse.  Should you have questions after your visit or need to cancel or reschedule your appointment, please contact [facility] [phone]  and follow the prompts.  Office hours are 8:00 a.m. to 4:30 p.m. Monday - Friday. Please note that voicemails left after 4:00 p.m. may not be returned until the following business day.  We are closed weekends and major holidays. You have access to a nurse at all times for urgent questions. Please call the main number to the clinic [phone] and follow the prompts.  For any non-urgent questions, you may also contact your provider using MyChart. We now offer e-Visits for anyone 18 and older to request care online for non-urgent symptoms. For details visit [email or web address].   Also download the MyChart app! Go to the app store, search "MyChart", open the app, select [facility], and log in with your MyChart username and password.   

## 2023-04-29 ENCOUNTER — Inpatient Hospital Stay: Payer: Medicaid Other

## 2023-04-29 ENCOUNTER — Ambulatory Visit (HOSPITAL_COMMUNITY): Payer: Medicaid Other

## 2023-05-06 ENCOUNTER — Inpatient Hospital Stay: Payer: Medicaid Other | Admitting: Hematology

## 2023-05-18 ENCOUNTER — Inpatient Hospital Stay: Payer: Medicaid Other | Attending: Hematology

## 2023-05-18 ENCOUNTER — Ambulatory Visit (HOSPITAL_COMMUNITY)
Admission: RE | Admit: 2023-05-18 | Discharge: 2023-05-18 | Disposition: A | Discharge status: Home / Self Care | Payer: Medicaid Other | Source: Ambulatory Visit | Attending: Hematology | Admitting: Hematology

## 2023-05-18 ENCOUNTER — Encounter (HOSPITAL_COMMUNITY): Payer: Self-pay

## 2023-05-18 DIAGNOSIS — D649 Anemia, unspecified: Secondary | ICD-10-CM | POA: Insufficient documentation

## 2023-05-18 DIAGNOSIS — C4371 Malignant melanoma of right lower limb, including hip: Secondary | ICD-10-CM

## 2023-05-18 DIAGNOSIS — D509 Iron deficiency anemia, unspecified: Secondary | ICD-10-CM | POA: Insufficient documentation

## 2023-05-18 DIAGNOSIS — C439 Malignant melanoma of skin, unspecified: Secondary | ICD-10-CM

## 2023-05-18 DIAGNOSIS — I700 Atherosclerosis of aorta: Secondary | ICD-10-CM | POA: Diagnosis not present

## 2023-05-18 DIAGNOSIS — E039 Hypothyroidism, unspecified: Secondary | ICD-10-CM | POA: Insufficient documentation

## 2023-05-18 DIAGNOSIS — J432 Centrilobular emphysema: Secondary | ICD-10-CM | POA: Diagnosis not present

## 2023-05-18 DIAGNOSIS — K5710 Diverticulosis of small intestine without perforation or abscess without bleeding: Secondary | ICD-10-CM | POA: Diagnosis not present

## 2023-05-18 DIAGNOSIS — Z85820 Personal history of malignant melanoma of skin: Secondary | ICD-10-CM | POA: Diagnosis not present

## 2023-05-18 LAB — CBC WITH DIFFERENTIAL/PLATELET
Abs Immature Granulocytes: 0 10*3/uL (ref 0.00–0.07)
Basophils Absolute: 0 10*3/uL (ref 0.0–0.1)
Basophils Relative: 1 %
Eosinophils Absolute: 0.1 10*3/uL (ref 0.0–0.5)
Eosinophils Relative: 3 %
HCT: 40.2 % (ref 36.0–46.0)
Hemoglobin: 14 g/dL (ref 12.0–15.0)
Immature Granulocytes: 0 %
Lymphocytes Relative: 32 %
Lymphs Abs: 1.6 10*3/uL (ref 0.7–4.0)
MCH: 32.6 pg (ref 26.0–34.0)
MCHC: 34.8 g/dL (ref 30.0–36.0)
MCV: 93.7 fL (ref 80.0–100.0)
Monocytes Absolute: 0.3 10*3/uL (ref 0.1–1.0)
Monocytes Relative: 6 %
Neutro Abs: 2.9 10*3/uL (ref 1.7–7.7)
Neutrophils Relative %: 58 %
Platelets: 208 10*3/uL (ref 150–400)
RBC: 4.29 MIL/uL (ref 3.87–5.11)
RDW: 12.5 % (ref 11.5–15.5)
WBC: 5 10*3/uL (ref 4.0–10.5)
nRBC: 0 % (ref 0.0–0.2)

## 2023-05-18 LAB — COMPREHENSIVE METABOLIC PANEL
ALT: 14 U/L (ref 0–44)
AST: 18 U/L (ref 15–41)
Albumin: 4.1 g/dL (ref 3.5–5.0)
Alkaline Phosphatase: 58 U/L (ref 38–126)
Anion gap: 8 (ref 5–15)
BUN: 7 mg/dL (ref 6–20)
CO2: 21 mmol/L — ABNORMAL LOW (ref 22–32)
Calcium: 9.6 mg/dL (ref 8.9–10.3)
Chloride: 106 mmol/L (ref 98–111)
Creatinine, Ser: 0.71 mg/dL (ref 0.44–1.00)
GFR, Estimated: >60 mL/min (ref >60)
Glucose, Bld: 118 mg/dL — ABNORMAL HIGH (ref 70–99)
Potassium: 3.4 mmol/L — ABNORMAL LOW (ref 3.5–5.1)
Sodium: 135 mmol/L (ref 135–145)
Total Bilirubin: 0.7 mg/dL (ref 0.3–1.2)
Total Protein: 7 g/dL (ref 6.5–8.1)

## 2023-05-18 LAB — IRON AND TIBC
Iron: 68 ug/dL (ref 28–170)
Saturation Ratios: 20 % (ref 10.4–31.8)
TIBC: 345 ug/dL (ref 250–450)
UIBC: 277 ug/dL

## 2023-05-18 LAB — FERRITIN: Ferritin: 63 ng/mL (ref 11–307)

## 2023-05-18 LAB — LACTATE DEHYDROGENASE: LDH: 94 U/L — ABNORMAL LOW (ref 98–192)

## 2023-05-18 MED ORDER — IOHEXOL 300 MG/ML  SOLN
80.0000 mL | Freq: Once | INTRAMUSCULAR | Status: AC | PRN
  Administered 2023-05-18: 80 mL via INTRAVENOUS

## 2023-05-18 MED ORDER — SODIUM CHLORIDE (PF) 0.9 % IJ SOLN
INTRAMUSCULAR | Status: AC
  Filled 2023-05-18: qty 50

## 2023-05-18 MED ORDER — IOHEXOL 9 MG/ML PO SOLN
1000.0000 mL | Freq: Once | ORAL | Status: AC
  Administered 2023-05-18: 1000 mL via ORAL

## 2023-05-23 NOTE — Progress Notes (Signed)
[facility] [address] [place] [address]    Clinic Day:  05/23/44   Referring physician: Doreatha Massed, MD  Patient Care Team: Patient, No Pcp Per as PCP - General (General Practice) Pricilla Riffle, MD as PCP - Cardiology (Cardiology)   ASSESSMENT & PLAN:   Assessment: 1.  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 -She underwent right third toe amputation at PIP level on 03/07/2020 for positive margins.  Pathology did not show any residual melanoma. -CT chest and abdomen on 03/21/2020 did not show any evidence of metastatic disease. - Adjuvant pembrolizumab from 03/25/2020 through 04/02/2021    2.  Family history: -Mother died of throat cancer.  Maternal grandmother had breast cancer.  Maternal aunt had pancreatic cancer.  Maternal grandfather had bone cancer.  Maternal great grandmother died of melanoma. -She will benefit from genetic testing.   3.  Right thyroid nodule: -PET scan on 12/20/2019 showed uptake in the right thyroid. -Ultrasound on 02/05/2020 did not show any suspicious nodules. -FNA on 05/01/2020 showed benign follicular nodule, Bethesda category 2.  Plan: 1.  Stage IIIb (T2B N2A) malignant melanoma of the right third toe, BRAF V6 100 E-: - She does not have any clinical signs or symptoms of recurrence. - CT CAP (05/18/2023): No evidence of metastatic disease. - Labs from 05/18/2023: Normal LFTs and creatinine.  CBC grossly normal. - RTC 6 months for follow-up with repeat labs.  Will consider imaging in 1 year. - Will make a referral to primary doctor. - Will make referral to urology for urgency and incontinence.   2.  Normocytic anemia: - She reports heavy menses for the last 2 cycles.  Reports feeling excessively tired. - Last Feraheme was  on 11/12/2022.  Ferritin today is 63 with percent saturation 20. - Recommend Feraheme weekly x 2.   3.  Hypothyroidism: - She is no longer on Synthroid.  Last TSH is 1.7.  Will repeat it at next visit.  Orders Placed This Encounter  Procedures   CBC with Differential    Standing Status:   Future    Standing Expiration Date:   05/23/2024   Comprehensive metabolic panel    Standing Status:   Future    Standing Expiration Date:   05/23/2024   Lactate dehydrogenase    Standing Status:   Future    Standing Expiration Date:   05/23/2024   TSH    Standing Status:   Future    Standing Expiration Date:   05/23/2024   Iron and TIBC ([facility] ONLY)    Standing Status:   Future    Standing Expiration Date:   05/23/2024   Ferritin    Standing Status:   Future    Standing Expiration Date:   05/23/2024      Donna Munoz,acting as a scribe for Doreatha Massed, MD.,have documented all relevant documentation on the behalf of Doreatha Massed, MD,as directed by  Doreatha Massed, MD while in the presence of Doreatha Massed, MD.   I, Doreatha Massed MD, have reviewed the above documentation for accuracy and completeness, and I agree with the above.   Doreatha Massed, MD   7/15/20245:15 PM  CHIEF COMPLAINT:   Diagnosis: malignant melanoma    Cancer Staging  Melanoma of skin [facility]) Staging form: Melanoma of  the Skin, AJCC 8th Edition - Clinical stage from 02/08/2020: Stage III (cT2b, cN2a, cM0) - Unsigned    Prior Therapy: Resection of right third toe on 01/30/2020 with right third toe amputation at PIP joint on 03/07/2020   NGS Results: BRAF V600E and V600K negative  Current Therapy: Adjuvant Keytruda every 3 weeks    HISTORY OF PRESENT ILLNESS:   Oncology History  Melanoma of skin (HCC)  12/11/2019 Initial Diagnosis   Melanoma of skin (HCC)   02/19/2020 Genetic Testing   BRAF Mutation Analysis     03/25/2020 - 04/02/2021 Chemotherapy    Patient is on Treatment Plan : MELANOMA Pembrolizumab q21d        INTERVAL HISTORY:   Donna Munoz is a 47 y.o. female presenting to clinic today for follow up of malignant melanoma. She was last seen by me on 10/29/22.  Her most recent labs on 7/9 showed CBC, Ferritin, and Iron and TIBC WNL. CMP showed abnormal potassium at 3.4, CO2 at 21, and blood glucose at 118. Lactate Dehydrogenase lab showed abnormal at 94.   She underwent a CT of the chest, abdomen, and pelvis w contrast on 7/9 that found no evidence of metastatic disease within the chest, abdomen or pelvis, but had Aortic Atherosclerosis and Emphysema. She notes she had nausea at the time of CT scan.   Today, she states that she is doing well overall. Her appetite level is at 50%. Her energy level is at 70%.  She c/o heavy menstruation lasting 2-3 days for the last 2 cycles. She notes they used to last 3-5 days and were not as heavy.   She c/o urinary incontinence and urgency, and has urinated herself on multiple occasions.   PAST MEDICAL HISTORY:   Past Medical History: Past Medical History:  Diagnosis Date   Anemia    Anxiety    Depression    GERD (gastroesophageal reflux disease)    Headache    Shingles    Tobacco abuse 05/06/2020    Surgical History: Past Surgical History:  Procedure Laterality Date   AMPUTATION TOE Right 03/07/2020   Procedure: RIGHT THIRD TOE AMPUTATION;  Surgeon: Almond Lint, MD;  Location: [facility] OR;  Service: General;  Laterality: Right;   BREAST LUMPECTOMY  age 47   CESAREAN SECTION  01/06/2006   CESAREAN SECTION  10/15/2016   DENTAL SURGERY  2019   INGUINAL HERNIA REPAIR Right 01/30/2020   Procedure: RIGHT INGUINAL HERNIA REPAIR WITH  MESH;  Surgeon: Almond Lint, MD;  Location: [facility];  Service: General;  Laterality: Right;   INSERTION OF MESH Right 01/30/2020   Procedure: INSERTION OF MESH;  Surgeon: Almond Lint, MD;  Location: [facility];  Service: General;   Laterality: Right;   IR CV LINE INJECTION  09/11/2020   IR IMAGING GUIDED PORT INSERTION  09/12/2020   IR REMOVAL TUN ACCESS W/ PORT W/O FL MOD SED  09/12/2020   IRRIGATION AND DEBRIDEMENT ABSCESS Right 03/07/2020   Procedure: Aspiration of Right Groin Seroma;  Surgeon: Almond Lint, MD;  Location: [facility] OR;  Service: General;  Laterality: Right;   MELANOMA EXCISION WITH SENTINEL LYMPH NODE BIOPSY Right 01/30/2020   Procedure: WIDE LOWER EXCISION RIGHT THIRD TOE MELANOMA EXCISION WITH SENTINEL LYMPH NODE BIOPSY;  Surgeon: Almond Lint, MD;  Location: [facility];  Service: General;  Laterality: Right;   PORT-A-CATH REMOVAL N/A 08/12/2022   Procedure: MINOR REMOVAL PORT-A-CATH;  Surgeon: Franky Macho, MD;  Location: [facility];  Service:  General;  Laterality: N/A;   PORTACATH PLACEMENT N/A 03/07/2020   Procedure: INSERTION PORT-A-CATH;  Surgeon: Almond Lint, MD;  Location: [facility];  Service: General;  Laterality: Left    Social History: Social History   Socioeconomic History   Marital status: Legally Separated    Spouse name: Not on file   Number of children: 2   Years of education: Not on file   Highest education level: Not on file  Occupational History   Occupation: unemployed d/t COVID  Tobacco Use   Smoking status: Every Day    Current packs/day: 1.00    Types: Cigarettes   Smokeless tobacco: Never  Vaping Use   Vaping status: Never Used  Substance and Sexual Activity   Alcohol use: No   Drug use: Yes    Types: Marijuana   Sexual activity: Yes  Other Topics Concern   Not on file  Social History Narrative   Not on file   Social Determinants of Health   Financial Resource Strain: High Risk (12/08/2019)   Overall Financial Resource Strain (CARDIA)    Difficulty of Paying Living Expenses: Hard  Food Insecurity: No Food Insecurity (12/08/2019)   Hunger Vital Sign    Worried About Running Out of Food in the Last Year: Never true    Ran Out of Food in the Last Year:  Never true  Transportation Needs: No Transportation Needs (12/26/2020)   PRAPARE - Administrator, Civil Service (Medical): No    Lack of Transportation (Non-Medical): No  Physical Activity: Inactive (12/26/2020)   Exercise Vital Sign    Days of Exercise per Week: 0 days    Minutes of Exercise per Session: 0 min  Stress: Stress Concern Present (12/08/2019)   [employer] of Occupational Health - Occupational Stress Questionnaire    Feeling of Stress : Very much  Social Connections: Socially Isolated (12/08/2019)   Social Connection and Isolation Panel [NHANES]    Frequency of Communication with Friends and Family: Twice a week    Frequency of Social Gatherings with Friends and Family: Never    Attends Religious Services: 1 to 4 times per year    Active Member of [employer] or Organizations: No    Attends Banker Meetings: Never    Marital Status: Separated  Intimate Partner Violence: Not At Risk (12/26/2020)   Humiliation, Afraid, Rape, and Kick questionnaire    Fear of Current or Ex-Partner: No    Emotionally Abused: No    Physically Abused: No    Sexually Abused: No    Family History: Family History  Problem Relation Age of Onset   Heart disease Mother    Cancer Mother    Ovarian cysts Sister    Healthy Brother    Cancer Maternal Grandmother    Heart attack Maternal Grandmother    Cancer Maternal Grandfather    Cancer Paternal Grandmother    Healthy Sister    Healthy Sister    Psoriasis Daughter    Healthy Son     Current Medications:  Current Outpatient Medications:    gabapentin (NEURONTIN) 100 MG capsule, Take 100 mg by mouth 3 (three) times daily as needed., Disp: , Rfl:    levothyroxine (SYNTHROID) 25 MCG tablet, Take 1 tablet (25 mcg total) by mouth daily before breakfast., Disp: 30 tablet, Rfl: 6   lidocaine-prilocaine (EMLA) cream, Apply a small amount to port a cath site (do not rub in) and cover with plastic wrap 1 hour prior to  chemotherapy appointments, Disp: 30 g, Rfl: 3   ondansetron (ZOFRAN ODT) 4 MG disintegrating tablet, Take 1 tablet (4 mg total) by mouth every 8 (eight) hours as needed for nausea or vomiting., Disp: 30 tablet, Rfl: 0   Allergies: Allergies  Allergen Reactions   Gadavist [Gadobutrol] Nausea Only    Mild nausea but no vomiting after injection.  Ok after 5 minutes.    REVIEW OF SYSTEMS:   Review of Systems  Constitutional:  Negative for chills, fatigue and fever.  HENT:   Negative for lump/mass, mouth sores, nosebleeds, sore throat and trouble swallowing.   Eyes:  Negative for eye problems.  Respiratory:  Positive for shortness of breath. Negative for cough.   Cardiovascular:  Negative for chest pain, leg swelling and palpitations.  Gastrointestinal:  Positive for nausea and vomiting. Negative for abdominal pain, constipation and diarrhea.       +loose stools  Genitourinary:  Positive for bladder incontinence. Negative for difficulty urinating, dysuria, frequency, hematuria and nocturia.        +urinary urgency  Musculoskeletal:  Positive for arthralgias (throughout the body, 6/10 severity). Negative for back pain, flank pain, myalgias and neck pain.  Skin:  Negative for itching and rash.  Neurological:  Positive for dizziness (at times), headaches and numbness (tingling in hands and feet).  Hematological:  Does not bruise/bleed easily.  Psychiatric/Behavioral:  Positive for depression and sleep disturbance (problems falling and staying asleep). Negative for suicidal ideas. The patient is nervous/anxious.   All other systems reviewed and are negative.    VITALS:   Blood pressure 103/71, pulse 72, temperature 97.6 F (36.4 C), resp. rate 18, weight 112 lb 9.6 oz (51.1 kg), last menstrual period 04/27/2023, SpO2 99%.  Wt Readings from Last 3 Encounters:  05/24/23 112 lb 9.6 oz (51.1 kg)  10/29/22 111 lb 9.6 oz (50.6 kg)  08/05/22 111 lb (50.3 kg)    Body mass index is 20.59  kg/m.  Performance status (ECOG): 1 - Symptomatic but completely ambulatory  PHYSICAL EXAM:   Physical Exam Vitals and nursing note reviewed. Exam conducted with a chaperone present.  Constitutional:      Appearance: Normal appearance.  Cardiovascular:     Rate and Rhythm: Normal rate and regular rhythm.     Pulses: Normal pulses.     Heart sounds: Normal heart sounds.  Pulmonary:     Effort: Pulmonary effort is normal.     Breath sounds: Normal breath sounds.  Abdominal:     Palpations: Abdomen is soft. There is no hepatomegaly, splenomegaly or mass.     Tenderness: There is no abdominal tenderness.  Musculoskeletal:     Right lower leg: No edema.     Left lower leg: No edema.  Lymphadenopathy:     Cervical: No cervical adenopathy.     Right cervical: No superficial, deep or posterior cervical adenopathy.    Left cervical: No superficial, deep or posterior cervical adenopathy.     Upper Body:     Right upper body: No supraclavicular or axillary adenopathy.     Left upper body: No supraclavicular or axillary adenopathy.  Neurological:     General: No focal deficit present.     Mental Status: She is alert and oriented to person, place, and time.  Psychiatric:        Mood and Affect: Mood normal.        Behavior: Behavior normal.     LABS:      Latest Ref Rng &  Units 05/18/2023    8:46 AM 10/22/2022   10:09 AM 04/24/2022    9:20 AM  CBC  WBC 4.0 - 10.5 K/uL 5.0  5.7  5.4   Hemoglobin 12.0 - 15.0 g/dL 16.1  09.6  04.5   Hematocrit 36.0 - 46.0 % 40.2  37.1  39.3   Platelets 150 - 400 K/uL 208  227  219       Latest Ref Rng & Units 05/18/2023    8:46 AM 10/22/2022   10:09 AM 04/24/2022    9:20 AM  CMP  Glucose 70 - 99 mg/dL 409  92  85   BUN 6 - 20 mg/dL 7  8  5    Creatinine 0.44 - 1.00 mg/dL 8.11  9.14  7.82   Sodium 135 - 145 mmol/L 135  137  137   Potassium 3.5 - 5.1 mmol/L 3.4  4.0  3.5   Chloride 98 - 111 mmol/L 106  108  107   CO2 22 - 32 mmol/L 21  24  25     Calcium 8.9 - 10.3 mg/dL 9.6  8.9  9.3   Total Protein 6.5 - 8.1 g/dL 7.0  6.8  6.8   Total Bilirubin 0.3 - 1.2 mg/dL 0.7  0.6  0.4   Alkaline Phos 38 - 126 U/L 58  61  66   AST 15 - 41 U/L 18  24  21    ALT 0 - 44 U/L 14  13  15       No results found for: "CEA1", "CEA" / No results found for: "CEA1", "CEA" No results found for: "PSA1" No results found for: "NFA213" No results found for: "CAN125"  No results found for: "TOTALPROTELP", "ALBUMINELP", "A1GS", "A2GS", "BETS", "BETA2SER", "GAMS", "MSPIKE", "SPEI" Lab Results  Component Value Date   TIBC 345 05/18/2023   TIBC 420 10/22/2022   TIBC 393 04/24/2022   FERRITIN 63 05/18/2023   FERRITIN 5 (L) 10/22/2022   FERRITIN 11 04/24/2022   IRONPCTSAT 20 05/18/2023   IRONPCTSAT 6 (L) 10/22/2022   IRONPCTSAT 18 04/24/2022   Lab Results  Component Value Date   LDH 94 (L) 05/18/2023   LDH 105 10/22/2022   LDH 86 (L) 04/24/2022     STUDIES:   CT CHEST ABDOMEN PELVIS W CONTRAST  Result Date: 05/18/2023 CLINICAL DATA:  History of malignant melanoma, monitor. * Tracking Code: BO * EXAM: CT CHEST, ABDOMEN, AND PELVIS WITH CONTRAST TECHNIQUE: Multidetector CT imaging of the chest, abdomen and pelvis was performed following the standard protocol during bolus administration of intravenous contrast. RADIATION DOSE REDUCTION: This exam was performed according to the departmental dose-optimization program which includes automated exposure control, adjustment of the mA and/or kV according to patient size and/or use of iterative reconstruction technique. CONTRAST:  80mL OMNIPAQUE IOHEXOL 300 MG/ML  SOLN COMPARISON:  Multiple priors including most recent CT October 22 2022 FINDINGS: CT CHEST FINDINGS Cardiovascular: Normal caliber thoracic aorta. No central pulmonary embolus on this nondedicated study. Normal size heart. No significant pericardial effusion/thickening. Mediastinum/Nodes: No suspicious thyroid nodule. No pathologically enlarged  mediastinal, hilar or axillary lymph nodes. The esophagus is grossly unremarkable. Lungs/Pleura: No suspicious pulmonary nodules or masses. Mild centrilobular emphysema. No focal airspace consolidation. No pleural effusion. No pneumothorax. Musculoskeletal: No aggressive lytic or blastic lesion of bone. CT ABDOMEN PELVIS FINDINGS Hepatobiliary: No solid enhancing hepatic lesion identified. Tiny bilobar hypodense hepatic foci technically too small to accurately characterize but given stability dating back to at least January 14, 2021  are favored benign. Gallbladder is unremarkable. No biliary ductal dilation. Pancreas: No pancreatic ductal dilation or evidence of acute inflammation. Spleen: No splenomegaly. Adrenals/Urinary Tract: Bilateral adrenal glands appear normal. No hydronephrosis. Kidneys demonstrate symmetric enhancement. Punctate nonobstructive left lower pole renal stone. Urinary bladder is unremarkable for degree of distension. Stomach/Bowel: Radiopaque enteric contrast material traverses the hepatic flexure. Stomach is nondistended limiting evaluation. Periampullary duodenal diverticulum. No pathologic dilation of small or large bowel. No evidence of acute bowel inflammation. Vascular/Lymphatic: Normal caliber abdominal aorta. Minimal aortic atherosclerosis. Smooth IVC contours. The portal, splenic and superior mesenteric veins are patent. No pathologically enlarged abdominal or pelvic lymph nodes. Reproductive: Homogeneous hypodense 2.8 cm left adnexal cyst is compatible with a benign physiologic ovarian follicle/cyst requiring no independent imaging follow-up. Uterus is unremarkable. Other: Trace pelvic free fluid is within physiologic normal limits. Surgical clips along the right inguinal canal. Musculoskeletal: No aggressive lytic or blastic lesion of bone. IMPRESSION: 1. No evidence of metastatic disease within the chest, abdomen or pelvis. 2. Aortic Atherosclerosis (ICD10-I70.0) and Emphysema  (ICD10-J43.9). Electronically Signed   By: Maudry Mayhew M.D.   On: 05/18/2023 15:25

## 2023-05-24 ENCOUNTER — Inpatient Hospital Stay: Payer: Medicaid Other | Admitting: Hematology

## 2023-05-24 VITALS — BP 103/71 | HR 72 | Temp 97.6°F | Resp 18 | Wt 112.6 lb

## 2023-05-24 DIAGNOSIS — E032 Hypothyroidism due to medicaments and other exogenous substances: Secondary | ICD-10-CM

## 2023-05-24 DIAGNOSIS — C4371 Malignant melanoma of right lower limb, including hip: Secondary | ICD-10-CM | POA: Diagnosis not present

## 2023-05-24 DIAGNOSIS — D649 Anemia, unspecified: Secondary | ICD-10-CM | POA: Diagnosis not present

## 2023-05-24 DIAGNOSIS — E039 Hypothyroidism, unspecified: Secondary | ICD-10-CM | POA: Diagnosis not present

## 2023-05-24 DIAGNOSIS — Z85820 Personal history of malignant melanoma of skin: Secondary | ICD-10-CM | POA: Diagnosis not present

## 2023-05-24 DIAGNOSIS — D509 Iron deficiency anemia, unspecified: Secondary | ICD-10-CM

## 2023-05-24 NOTE — Patient Instructions (Addendum)
[facility] at [facility] Discharge Instructions   You were seen and examined today by Dr. Ellin Saba.  He reviewed the results of your CT scan which did not show any evidence of cancer.   He reviewed the results of your lab work which are normal/stable.   We will arrange for you to have two iron infusions.  We will see you back in 6 months. We will repeat lab work prior to your next visit.    Return as scheduled.    Thank you for choosing [facility] at [facility] to provide your oncology and hematology care.  To afford each patient quality time with our provider, please arrive at least 15 minutes before your scheduled appointment time.   If you have a lab appointment with the Cancer Center please come in thru the Main Entrance and check in at the main information desk.  You need to re-schedule your appointment should you arrive 10 or more minutes late.  We strive to give you quality time with our providers, and arriving late affects you and other patients whose appointments are after yours.  Also, if you no show three or more times for appointments you may be dismissed from the clinic at the providers discretion.     Again, thank you for choosing [facility].  Our hope is that these requests will decrease the amount of time that you wait before being seen by our physicians.       _____________________________________________________________  Should you have questions after your visit to [facility], please contact our office at [phone] and follow the prompts.  Our office hours are 8:00 a.m. and 4:30 p.m. Monday - Friday.  Please note that voicemails left after 4:00 p.m. may not be returned until the following business day.  We are closed weekends and major holidays.  You do have access to a nurse 24-7, just call the main number to the clinic [phone] and do not press any options, hold on the line and a nurse  will answer the phone.    For prescription refill requests, have your pharmacy contact our office and allow 72 hours.    Due to Covid, you will need to wear a mask upon entering the hospital. If you do not have a mask, a mask will be given to you at the Main Entrance upon arrival. For doctor visits, patients may have 1 support person age 76 or older with them. For treatment visits, patients can not have anyone with them due to social distancing guidelines and our immunocompromised population.

## 2023-06-02 ENCOUNTER — Encounter: Payer: Self-pay | Admitting: Internal Medicine

## 2023-06-02 ENCOUNTER — Ambulatory Visit: Payer: Medicaid Other | Admitting: Internal Medicine

## 2023-06-02 VITALS — BP 102/70 | HR 83 | Ht 62.0 in | Wt 113.4 lb

## 2023-06-02 DIAGNOSIS — R739 Hyperglycemia, unspecified: Secondary | ICD-10-CM | POA: Diagnosis not present

## 2023-06-02 DIAGNOSIS — Z114 Encounter for screening for human immunodeficiency virus [HIV]: Secondary | ICD-10-CM | POA: Diagnosis not present

## 2023-06-02 DIAGNOSIS — F331 Major depressive disorder, recurrent, moderate: Secondary | ICD-10-CM | POA: Insufficient documentation

## 2023-06-02 DIAGNOSIS — G629 Polyneuropathy, unspecified: Secondary | ICD-10-CM

## 2023-06-02 DIAGNOSIS — E032 Hypothyroidism due to medicaments and other exogenous substances: Secondary | ICD-10-CM | POA: Diagnosis not present

## 2023-06-02 DIAGNOSIS — K2100 Gastro-esophageal reflux disease with esophagitis, without bleeding: Secondary | ICD-10-CM | POA: Insufficient documentation

## 2023-06-02 DIAGNOSIS — C439 Malignant melanoma of skin, unspecified: Secondary | ICD-10-CM

## 2023-06-02 DIAGNOSIS — Z1159 Encounter for screening for other viral diseases: Secondary | ICD-10-CM | POA: Diagnosis not present

## 2023-06-02 DIAGNOSIS — Z720 Tobacco use: Secondary | ICD-10-CM | POA: Diagnosis not present

## 2023-06-02 MED ORDER — OMEPRAZOLE 40 MG PO CPDR: 40.0000 mg | DELAYED_RELEASE_CAPSULE | Freq: Every day | ORAL | 3 refills | Status: DC

## 2023-06-02 MED ORDER — DULOXETINE HCL 30 MG PO CPEP: 30.0000 mg | ORAL_CAPSULE | Freq: Every day | ORAL | 2 refills | Status: DC

## 2023-06-02 NOTE — Assessment & Plan Note (Signed)
Her epigastric pain and nausea is likely due to GERD with esophagitis Started Omeprazole Avoid hot and spicy food Needs to cut down soft drink intake and quit smoking

## 2023-06-02 NOTE — Patient Instructions (Addendum)
Please start taking Duloxetine as prescribed for neuropathic symptoms. Please take Vitamin B12 500 mcg once daily.  Please start taking Omeprazole as prescribed for acid reflux.  Please avoid hot and spicy food. Please cut down soft drink intake.  Please get fasting blood tests done before the next visit.

## 2023-06-02 NOTE — Assessment & Plan Note (Signed)
Lab Results  Component Value Date   TSH 1.763 10/22/2022   Was placed on levothyroxine in the past, she has stopped taking it since  12/23 Check TSH and free T4

## 2023-06-02 NOTE — Assessment & Plan Note (Signed)
Uncontrolled Started Cymbalta 30 mg QD for MDD and neuropathy

## 2023-06-02 NOTE — Assessment & Plan Note (Signed)
Smokes about 1 pack/day  Asked about quitting: confirms that he/she currently smokes cigarettes Advise to quit smoking: Educated about QUITTING to reduce the risk of cancer, cardio and cerebrovascular disease. Assess willingness: Unwilling to quit at this time, but is working on cutting back. Assist with counseling and pharmacotherapy: Counseled for 5 minutes and literature provided. Arrange for follow up: follow up in 3 months and continue to offer help. 

## 2023-06-02 NOTE — Assessment & Plan Note (Signed)
Status post amputation of right third toe Followed by oncology

## 2023-06-02 NOTE — Progress Notes (Signed)
New Patient Office Visit  Subjective:  Patient ID: Donna Munoz, female    DOB: 1976-07-02  Age: 47 y.o. MRN: [id number]  CC:  Chief Complaint  Patient presents with   Establish Care    HPI Donna Munoz is a 47 y.o. 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  She completed immunotherapy with pembrolizumab in 2022.  She is followed by oncology currently.  She has chronic numbness of the right LE > left LE.  She was given gabapentin, but was causing mild drowsiness on an intermittent basis.  She has stopped taking gabapentin now.  She also reports chronic, intermittent low back pain, worse with standing.  Denies any recent injury or fall.  She reports apathy, insomnia, decreased appetite, decreased concentration and easy irritability for the last 6 months.  She has tried amitriptyline in the past, which caused drowsiness.  Denies any SI or HI currently.  She also reports chronic nausea especially after eating.  She also reports epigastric discomfort.  Her recent CT abdomen showed signs of esophagitis.  Denies any vomiting, melena or hematochezia.  She takes about 8 cans of [facility] almost daily.    Past Medical History:  Diagnosis Date   Anemia    Anxiety    Depression    GERD (gastroesophageal reflux disease)    Headache    Shingles    Tobacco abuse 05/06/2020    Past Surgical History:  Procedure Laterality Date   AMPUTATION TOE Right 03/07/2020   Procedure: RIGHT THIRD TOE AMPUTATION;  Surgeon: Almond Lint, MD;  Location: [facility];  Service: General;  Laterality: Right;   BREAST LUMPECTOMY  age 56   CESAREAN SECTION  01/06/2006   CESAREAN SECTION  10/15/2016   DENTAL SURGERY  2019   INGUINAL HERNIA REPAIR Right 01/30/2020   Procedure: RIGHT INGUINAL HERNIA REPAIR WITH  MESH;  Surgeon: Almond Lint, MD;   Location: [facility];  Service: General;  Laterality: Right;   INSERTION OF MESH Right 01/30/2020   Procedure: INSERTION OF MESH;  Surgeon: Almond Lint, MD;  Location: [facility];  Service: General;  Laterality: Right;   IR CV LINE INJECTION  09/11/2020   IR IMAGING GUIDED PORT INSERTION  09/12/2020   IR REMOVAL TUN ACCESS W/ PORT W/O FL MOD SED  09/12/2020   IRRIGATION AND DEBRIDEMENT ABSCESS Right 03/07/2020   Procedure: Aspiration of Right Groin Seroma;  Surgeon: Almond Lint, MD;  Location[facility];  Service: General;  Laterality: Right;   MELANOMA EXCISION WITH SENTINEL LYMPH NODE BIOPSY Right 01/30/2020   Procedure: WIDE LOWER EXCISION RIGHT THIRD TOE MELANOMA EXCISION WITH SENTINEL LYMPH NODE BIOPSY;  Surgeon: Almond Lint, MD;  Location: [facility];  Service: General;  Laterality: Right;   PORT-A-CATH REMOVAL N/A 08/12/2022   Procedure: MINOR REMOVAL PORT-A-CATH;  Surgeon: Franky Macho, MD;  Location: [facility];  Service: General;  Laterality: N/A;   PORTACATH PLACEMENT N/A 03/07/2020   Procedure: INSERTION PORT-A-CATH;  Surgeon: Almond Lint, MD;  [facility]: [facility];  Service: General;  Laterality: Left    Family History  Problem Relation Age of Onset   Heart disease Mother    Cancer Mother    Ovarian cysts Sister    Healthy Brother    Cancer Maternal Grandmother    Heart attack Maternal Grandmother  Cancer Maternal Grandfather    Cancer Paternal Grandmother    Healthy Sister    Healthy Sister    Psoriasis Daughter    Healthy Son     Social History   Socioeconomic History   Marital status: Legally Separated    Spouse name: Not on file   Number of children: 2   Years of education: Not on file   Highest education level: Not on file  Occupational History   Occupation: unemployed d/t COVID  Tobacco Use   Smoking status: Every Day    Current packs/day: 1.00    Types: Cigarettes   Smokeless tobacco: Never  Vaping Use   Vaping  status: Never Used  Substance and Sexual Activity   Alcohol use: No   Drug use: Yes    Types: Marijuana   Sexual activity: Yes  Other Topics Concern   Not on file  Social History Narrative   Not on file   Social Determinants of Health   Financial Resource Strain: High Risk (12/08/2019)   Overall Financial Resource Strain (CARDIA)    Difficulty of Paying Living Expenses: Hard  Food Insecurity: No Food Insecurity (12/08/2019)   Hunger Vital Sign    Worried About Running Out of Food in the Last Year: Never true    Ran Out of Food in the Last Year: Never true  Transportation Needs: No Transportation Needs (12/26/2020)   PRAPARE - Administrator, Civil Service (Medical): No    Lack of Transportation (Non-Medical): No  Physical Activity: Inactive (12/26/2020)   Exercise Vital Sign    Days of Exercise per Week: 0 days    Minutes of Exercise per Session: 0 min  Stress: Stress Concern Present (12/08/2019)   [employer] of Occupational Health - Occupational Stress Questionnaire    Feeling of Stress : Very much  Social Connections: Socially Isolated (12/08/2019)   Social Connection and Isolation Panel [NHANES]    Frequency of Communication with Friends and Family: Twice a week    Frequency of Social Gatherings with Friends and Family: Never    Attends Religious Services: 1 to 4 times per year    Active Member of [employer] or Organizations: No    Attends Banker Meetings: Never    Marital Status: Separated  Intimate Partner Violence: Not At Risk (12/26/2020)   Humiliation, Afraid, Rape, and Kick questionnaire    Fear of Current or Ex-Partner: No    Emotionally Abused: No    Physically Abused: No    Sexually Abused: No    ROS Review of Systems  Constitutional:  Positive for fatigue. Negative for chills and fever.  HENT:  Negative for congestion, sinus pressure, sinus pain and sore throat.   Eyes:  Negative for pain and discharge.  Respiratory:  Negative for  cough and shortness of breath.   Cardiovascular:  Negative for chest pain and palpitations.  Gastrointestinal:  Positive for abdominal pain (Mild, epigastric) and nausea. Negative for diarrhea and vomiting.  Endocrine: Negative for polydipsia and polyuria.  Genitourinary:  Negative for dysuria and hematuria.  Musculoskeletal:  Negative for neck pain and neck stiffness.  Skin:  Negative for rash.  Neurological:  Positive for numbness (B/l LE and UE). Negative for dizziness and weakness.  Psychiatric/Behavioral:  Positive for decreased concentration, dysphoric mood and sleep disturbance. Negative for agitation and behavioral problems. The patient is nervous/anxious.     Objective:   Today's Vitals: BP 102/70 (BP Location: Left Arm, Patient Position: Sitting, Cuff Size:  Normal)   Pulse 83   Ht 5\' 2"  (1.575 m)   Wt 113 lb 6.4 oz (51.4 kg)   LMP 04/27/2023   SpO2 97%   BMI 20.74 kg/m   Physical Exam Vitals reviewed.  Constitutional:      General: She is not in acute distress.    Appearance: She is not diaphoretic.  HENT:     Head: Normocephalic and atraumatic.     Nose: Nose normal.     Mouth/Throat:     Mouth: Mucous membranes are moist.  Eyes:     General: No scleral icterus.    Extraocular Movements: Extraocular movements intact.  Cardiovascular:     Rate and Rhythm: Normal rate and regular rhythm.     Heart sounds: Normal heart sounds. No murmur heard. Pulmonary:     Breath sounds: Normal breath sounds. No wheezing or rales.  Abdominal:     Palpations: Abdomen is soft.     Tenderness: There is abdominal tenderness (Mild, epigastric).  Musculoskeletal:     Cervical back: Neck supple. No tenderness.     Right lower leg: No edema.     Left lower leg: No edema.  Skin:    General: Skin is warm.     Findings: No rash.  Neurological:     General: No focal deficit present.     Mental Status: She is alert and oriented to person, place, and time.     Sensory: Sensory deficit  (RLE, from midshin to toes) present.     Motor: No weakness.  Psychiatric:        Mood and Affect: Mood normal.        Behavior: Behavior normal.     Assessment & Plan:   Problem List Items Addressed This Visit       Digestive   Gastroesophageal reflux disease with esophagitis without hemorrhage    Her epigastric pain and nausea is likely due to GERD with esophagitis Started Omeprazole Avoid hot and spicy food Needs to cut down soft drink intake and quit smoking      Relevant Medications   omeprazole (PRILOSEC) 40 MG capsule     Endocrine   Hypothyroidism    Lab Results  Component Value Date   TSH 1.763 10/22/2022   Was placed on levothyroxine in the past, she has stopped taking it since  12/23 Check TSH and free T4      Relevant Orders   TSH + free T4     Nervous and Auditory   Peripheral polyneuropathy    Chronic numbness of the RLE > LLE Likely due to polyneuropathy DPA pulse intact Started Cymbalta 30 mg QD for neuropathy and MDD      Relevant Medications   DULoxetine (CYMBALTA) 30 MG capsule     Musculoskeletal and Integument   Melanoma of skin (HCC) - Primary    Status post amputation of right third toe Followed by oncology        Other   Tobacco abuse    Smokes about 1 pack/day  Asked about quitting: confirms that he/she currently smokes cigarettes Advise to quit smoking: Educated about QUITTING to reduce the risk of cancer, cardio and cerebrovascular disease. Assess willingness: Unwilling to quit at this time, but is working on cutting back. Assist with counseling and pharmacotherapy: Counseled for 5 minutes and literature provided. Arrange for follow up: follow up in 3 months and continue to offer help.      Moderate episode of recurrent major depressive  disorder (HCC)    Uncontrolled Started Cymbalta 30 mg QD for MDD and neuropathy      Relevant Medications   DULoxetine (CYMBALTA) 30 MG capsule   Other Relevant Orders   Vitamin D  (25 hydroxy)   B12   Other Visit Diagnoses     Screening for HIV (human immunodeficiency virus)       Relevant Orders   HIV antibody (with reflex)   Need for hepatitis C screening test       Relevant Orders   Hepatitis C Antibody   Hyperglycemia       Relevant Orders   Hemoglobin A1c       Outpatient Encounter Medications as of 06/02/2023  Medication Sig   DULoxetine (CYMBALTA) 30 MG capsule Take 1 capsule (30 mg total) by mouth daily.   omeprazole (PRILOSEC) 40 MG capsule Take 1 capsule (40 mg total) by mouth daily.   levothyroxine (SYNTHROID) 25 MCG tablet Take 1 tablet (25 mcg total) by mouth daily before breakfast. (Patient not taking: Reported on 06/02/2023)   [DISCONTINUED] ferrous sulfate 325 (65 FE) MG tablet Take by mouth. (Patient not taking: Reported on 06/02/2023)   [DISCONTINUED] gabapentin (NEURONTIN) 100 MG capsule Take 100 mg by mouth 3 (three) times daily as needed. (Patient not taking: Reported on 06/02/2023)   [DISCONTINUED] lidocaine-prilocaine (EMLA) cream Apply a small amount to port a cath site (do not rub in) and cover with plastic wrap 1 hour prior to chemotherapy appointments (Patient not taking: Reported on 06/02/2023)   [DISCONTINUED] ondansetron (ZOFRAN ODT) 4 MG disintegrating tablet Take 1 tablet (4 mg total) by mouth every 8 (eight) hours as needed for nausea or vomiting. (Patient not taking: Reported on 06/02/2023)   No facility-administered encounter medications on file as of 06/02/2023.    Follow-up: Return in about 3 months (around 09/02/2023) for MDD.   Anabel Halon, MD

## 2023-06-02 NOTE — Assessment & Plan Note (Addendum)
Chronic numbness of the RLE > LLE Likely due to polyneuropathy DPA pulse intact Started Cymbalta 30 mg QD for neuropathy and MDD

## 2023-06-22 ENCOUNTER — Encounter: Payer: Self-pay | Admitting: Urology

## 2023-06-22 ENCOUNTER — Ambulatory Visit: Payer: Medicaid Other | Admitting: Urology

## 2023-06-22 VITALS — BP 105/68 | HR 79 | Temp 98.3°F

## 2023-06-22 DIAGNOSIS — R3129 Other microscopic hematuria: Secondary | ICD-10-CM

## 2023-06-22 DIAGNOSIS — R682 Dry mouth, unspecified: Secondary | ICD-10-CM

## 2023-06-22 DIAGNOSIS — R1032 Left lower quadrant pain: Secondary | ICD-10-CM

## 2023-06-22 DIAGNOSIS — Z87442 Personal history of urinary calculi: Secondary | ICD-10-CM | POA: Diagnosis not present

## 2023-06-22 DIAGNOSIS — N200 Calculus of kidney: Secondary | ICD-10-CM

## 2023-06-22 DIAGNOSIS — R350 Frequency of micturition: Secondary | ICD-10-CM

## 2023-06-22 DIAGNOSIS — Z720 Tobacco use: Secondary | ICD-10-CM

## 2023-06-22 DIAGNOSIS — R3915 Urgency of urination: Secondary | ICD-10-CM

## 2023-06-22 DIAGNOSIS — N3941 Urge incontinence: Secondary | ICD-10-CM | POA: Diagnosis not present

## 2023-06-22 DIAGNOSIS — N3281 Overactive bladder: Secondary | ICD-10-CM

## 2023-06-22 DIAGNOSIS — R300 Dysuria: Secondary | ICD-10-CM

## 2023-06-22 DIAGNOSIS — H04129 Dry eye syndrome of unspecified lacrimal gland: Secondary | ICD-10-CM

## 2023-06-22 LAB — URINALYSIS, ROUTINE W REFLEX MICROSCOPIC
Bilirubin, UA: NEGATIVE
Glucose, UA: NEGATIVE
Leukocytes,UA: NEGATIVE
Nitrite, UA: NEGATIVE
Protein,UA: NEGATIVE
Specific Gravity, UA: 1.03 (ref 1.005–1.030)
Urobilinogen, Ur: 1 mg/dL (ref 0.2–1.0)
pH, UA: 6 (ref 5.0–7.5)

## 2023-06-22 LAB — MICROSCOPIC EXAMINATION: Bacteria, UA: NONE SEEN

## 2023-06-22 LAB — BLADDER SCAN AMB NON-IMAGING: Scan Result: 34

## 2023-06-22 MED ORDER — MIRABEGRON ER 25 MG PO TB24
25.0000 mg | ORAL_TABLET | Freq: Every day | ORAL | 11 refills | Status: DC
Start: 2023-06-22 — End: 2024-01-11

## 2023-06-22 NOTE — Progress Notes (Signed)
Name: Donna Munoz DOB: 28-Sep-1976 MRN: [id number]  History of Present Illness: Donna Munoz is a 47 y.o. female who presents today as a new patient at [facility]. All available relevant medical records have been reviewed.   She reports urinary frequency, nocturia, urgency, and urge incontinence. Voiding >10x/day and 2-3x/night on average. Leaking small amounts of urine several times per day; using pads routinely. She denies prior attempted treatment for these symptoms. She reports routine caffeine intake [facility]).  She reports mild dysuria over the past few weeks. Reports "light pink" on toilet paper when she wiped this morning; denies gross hematuria. Reports intermittent LLQ pain for the past 5-6 months. Reports history of kidney stones; passed spontaneously.   She denies history of recent or recurrent UTI. She denies history of GU malignancy or pelvic radiation.  She denies history of autoimmune disease. She reports history of smoking (smoked 1 ppd x29 years). She denies taking anticoagulants.   Fall Screening: Do you usually have a device to assist in your mobility? No   Medications: Current Outpatient Medications  Medication Sig Dispense Refill   DULoxetine (CYMBALTA) 30 MG capsule Take 1 capsule (30 mg total) by mouth daily. 30 capsule 2   levothyroxine (SYNTHROID) 25 MCG tablet Take 1 tablet (25 mcg total) by mouth daily before breakfast. 30 tablet 6   mirabegron ER (MYRBETRIQ) 25 MG TB24 tablet Take 1 tablet (25 mg total) by mouth daily. 30 tablet 11   omeprazole (PRILOSEC) 40 MG capsule Take 1 capsule (40 mg total) by mouth daily. 30 capsule 3   No current facility-administered medications for this visit.    Allergies: Allergies  Allergen Reactions   Gadavist [Gadobutrol] Nausea Only    Mild nausea but no vomiting after injection.  Ok after 5 minutes.    Past Medical History:  Diagnosis Date   Anemia    Anxiety    Depression    GERD  (gastroesophageal reflux disease)    Headache    Shingles    Tobacco abuse 05/06/2020   Past Surgical History:  Procedure Laterality Date   AMPUTATION TOE Right 03/07/2020   Procedure: RIGHT THIRD TOE AMPUTATION;  Surgeon: Almond Lint, MD;  Location: [facility];  Service: General;  Laterality: Right;   BREAST LUMPECTOMY  age 15   CESAREAN SECTION  01/06/2006   CESAREAN SECTION  10/15/2016   DENTAL SURGERY  2019   INGUINAL HERNIA REPAIR Right 01/30/2020   Procedure: RIGHT INGUINAL HERNIA REPAIR WITH  MESH;  Surgeon: Almond Lint, MD;  Location: [facility];  Service: General;  Laterality: Right;   INSERTION OF MESH Right 01/30/2020   Procedure: INSERTION OF MESH;  Surgeon: Almond Lint, MD;  Location: [facility];  Service: General;  Laterality: Right;   IR CV LINE INJECTION  09/11/2020   IR IMAGING GUIDED PORT INSERTION  09/12/2020   IR REMOVAL TUN ACCESS W/ PORT W/O FL MOD SED  09/12/2020   IRRIGATION AND DEBRIDEMENT ABSCESS Right 03/07/2020   Procedure: Aspiration of Right Groin Seroma;  Surgeon: Almond Lint, MD;  Location: [facility] OR;  Service: General;  Laterality: Right;   MELANOMA EXCISION WITH SENTINEL LYMPH NODE BIOPSY Right 01/30/2020   Procedure: WIDE LOWER EXCISION RIGHT THIRD TOE MELANOMA EXCISION WITH SENTINEL LYMPH NODE BIOPSY;  Surgeon: Almond Lint, MD;  Location: [facility];  Service: General;  Laterality: Right;   PORT-A-CATH REMOVAL N/A 08/12/2022   Procedure: MINOR REMOVAL PORT-A-CATH;  Surgeon: Franky Macho, MD;  Location: [facility];  Service: General;  Laterality: N/A;   PORTACATH PLACEMENT N/A 03/07/2020   Procedure: INSERTION PORT-A-CATH;  Surgeon: Almond Lint, MD;  Location: [facility];  Service: General;  Laterality: Left   Family History  Problem Relation Age of Onset   Heart disease Mother    Cancer Mother    Ovarian cysts Sister    Healthy Brother    Cancer Maternal Grandmother    Heart attack Maternal Grandmother    Cancer  Maternal Grandfather    Cancer Paternal Grandmother    Healthy Sister    Healthy Sister    Psoriasis Daughter    Healthy Son    Social History   Socioeconomic History   Marital status: Legally Separated    Spouse name: Not on file   Number of children: 2   Years of education: Not on file   Highest education level: Not on file  Occupational History   Occupation: unemployed d/t COVID  Tobacco Use   Smoking status: Every Day    Current packs/day: 1.00    Average packs/day: 1 pack/day for 29.0 years (29.0 ttl pk-yrs)    Types: Cigarettes   Smokeless tobacco: Never  Vaping Use   Vaping status: Never Used  Substance and Sexual Activity   Alcohol use: No   Drug use: Yes    Types: Marijuana   Sexual activity: Yes  Other Topics Concern   Not on file  Social History Narrative   Not on file   Social Determinants of Health   Financial Resource Strain: High Risk (12/08/2019)   Overall Financial Resource Strain (CARDIA)    Difficulty of Paying Living Expenses: Hard  Food Insecurity: No Food Insecurity (12/08/2019)   Hunger Vital Sign    Worried About Running Out of Food in the Last Year: Never true    Ran Out of Food in the Last Year: Never true  Transportation Needs: No Transportation Needs (12/26/2020)   PRAPARE - Administrator, Civil Service (Medical): No    Lack of Transportation (Non-Medical): No  Physical Activity: Inactive (12/26/2020)   Exercise Vital Sign    Days of Exercise per Week: 0 days    Minutes of Exercise per Session: 0 min  Stress: Stress Concern Present (12/08/2019)   [employer] of Occupational Health - Occupational Stress Questionnaire    Feeling of Stress : Very much  Social Connections: Socially Isolated (12/08/2019)   Social Connection and Isolation Panel [NHANES]    Frequency of Communication with Friends and Family: Twice a week    Frequency of Social Gatherings with Friends and Family: Never    Attends Religious Services: 1 to 4  times per year    Active Member of [employer] or Organizations: No    Attends Banker Meetings: Never    Marital Status: Separated  Intimate Partner Violence: Not At Risk (12/26/2020)   Humiliation, Afraid, Rape, and Kick questionnaire    Fear of Current or Ex-Partner: No    Emotionally Abused: No    Physically Abused: No    Sexually Abused: No    SUBJECTIVE  Review of Systems Constitutional: Patient denies any unintentional weight loss or change in strength lntegumentary: Patient denies any rashes or pruritus Eyes: Patient reports dry eyes ENT: Patient reports dry mouth Cardiovascular: Patient denies chest pain or syncope Respiratory: Patient denies shortness of breath Gastrointestinal: Patient denies nausea, vomiting, constipation, or diarrhea Musculoskeletal: Patient denies muscle cramps or weakness Neurologic: Patient denies convulsions or seizures  Psychiatric: Patient denies memory problems Allergic/Immunologic: Patient denies recent allergic reaction(s) Hematologic/Lymphatic: Patient denies bleeding tendencies Endocrine: Patient denies heat/cold intolerance  GU: As per HPI.  OBJECTIVE Vitals:   06/22/23 1007  BP: 105/68  Pulse: 79  Temp: 98.3 F (36.8 C)   There is no height or weight on file to calculate BMI.  Physical Examination  Constitutional: No obvious distress; patient is non-toxic appearing  Cardiovascular: No visible lower extremity edema.  Respiratory: The patient does not have audible wheezing/stridor; respirations do not appear labored  Gastrointestinal: Abdomen non-distended Musculoskeletal: Normal ROM of UEs  Skin: No obvious rashes/open sores  Neurologic: CN 2-12 grossly intact Psychiatric: Answered questions appropriately with normal affect  Hematologic/Lymphatic/Immunologic: No obvious bruises or sites of spontaneous bleeding  UA: positive for 3-10 RBC/hpf PVR: 34 ml  ASSESSMENT Urinary frequency - Plan: Urinalysis, Routine w  reflex microscopic, BLADDER SCAN AMB NON-IMAGING, mirabegron ER (MYRBETRIQ) 25 MG TB24 tablet  Urge incontinence of urine - Plan: mirabegron ER (MYRBETRIQ) 25 MG TB24 tablet  Microscopic hematuria - Plan: DG Abd 1 View, US RENAL  Tobacco abuse - Plan: US RENAL  Dysuria  Urinary urgency - Plan: mirabegron ER (MYRBETRIQ) 25 MG TB24 tablet  LLQ pain - Plan: DG Abd 1 View, US RENAL  Kidney stones - Plan: DG Abd 1 View, US RENAL  Dry mouth  Dry eye  1. OAB with urinary frequency, urgency, and urge incontinence. We discussed the symptoms of overactive bladder (OAB), which include urinary urgency, frequency, nocturia, with or without urge incontinence. While we may not know the exact etiology of OAB, several risk factors can be identified. Likely exacerbated by caffeine intake.  We discussed the following management options in detail including potential benefits, risks, and side effects: Behavioral therapy: Modify fluid intake Decreasing bladder irritants (such as caffeine, acidic foods, spicy foods, alcohol) Urge suppression strategies Bladder retraining / timed voiding Double voiding Medication(s): - For anticholinergic medications, we discussed the potential side effects of anticholinergics including dry eyes, dry mouth, constipation, cognitive impairment and urinary retention.  - For beta-3 agonist medication, we discussed the risk for urinary retention and the potential side effect of elevated blood pressure specific to Myrbetriq (which is more likely to occur in individuals with uncontrolled hypertension).   She decided to proceed with Myrbetriq 25 mg daily and to work on behavioral modifications including minimizing caffeine intake and working on timed voiding.  2. Microscopic hematuria:  For asymptomatic microscopic hematuria we discussed possible etiologies including but not limited to: vigorous exercise, sexual activity, stone, trauma, blood thinner use, urinary tract  infection, urethral irritation secondary to vaginal atrophy, chronic kidney disease, glomerulonephropathy, malignancy. We discussed pt's smoking as a risk factor for GU cancer and encouraged smoking cessation.  We reviewed the AUA 2020 [facility] guideline and risk stratification for this patient. Based on individual risk factors, pt was advised that the recommended workup includes RUS and cystoscopy.   3. History of kidney stones. Obtaining RUS & KUB to assess current stone burden.  Pt decided to pursue this work-up and follow-up afterward to discuss the results and formulate a treatment plan based on the findings. All questions were answered.   PLAN Advised the following: RUS and KUB. Start Myrbetriq 25 mg daily. Minimize caffeine intake. Work on timed voiding. Return for 1st available cystoscopy with Dr. Ronne Binning.  Orders Placed This Encounter  Procedures   DG Abd 1 View    Standing Status:   Future    Standing Expiration Date:  06/21/2024    Order Specific Question:   Reason for Exam (SYMPTOM  OR DIAGNOSIS REQUIRED)    Answer:   kidney stone    Order Specific Question:   Is patient pregnant?    Answer:   Unknown (Please Explain)    Order Specific Question:   Preferred imaging location?    Answer:   [facility]   US RENAL    Standing Status:   Future    Standing Expiration Date:   06/21/2024    Order Specific Question:   Reason for Exam (SYMPTOM  OR DIAGNOSIS REQUIRED)    Answer:   kidney stone known or suspected    Order Specific Question:   Preferred imaging location?    Answer:   [facility]   Urinalysis, Routine w reflex microscopic   BLADDER SCAN AMB NON-IMAGING    It has been explained that the patient is to follow regularly with their PCP in addition to all other providers involved in their care and to follow instructions provided by these respective offices. Patient advised to contact urology clinic if any urologic-pertaining questions, concerns, new symptoms  or problems arise in the interim period.  Patient Instructions  PLEASE CALL [phone] OPT 4 THEN OPT 2 TO SCHEDULE RENAL ULTRASOUND. NO APPOINTMENT IS NEEDED TO GET YOUR KUB. GO BY [place] [facility] TO  HAVE YOUR KUB DONE.  Electronically signed by:  Donnita Falls, MSN, FNP-C, CUNP 06/22/2023 11:43 AM

## 2023-06-22 NOTE — Progress Notes (Signed)
post void residual=34 ?

## 2023-06-22 NOTE — Patient Instructions (Signed)
PLEASE CALL [phone] OPT 4 THEN OPT 2 TO SCHEDULE RENAL ULTRASOUND. NO APPOINTMENT IS NEEDED TO GET YOUR KUB. GO BY [place] [facility] TO  HAVE YOUR KUB DONE.

## 2023-06-25 ENCOUNTER — Other Ambulatory Visit: Payer: Self-pay | Admitting: Internal Medicine

## 2023-06-25 DIAGNOSIS — G629 Polyneuropathy, unspecified: Secondary | ICD-10-CM

## 2023-06-25 DIAGNOSIS — F331 Major depressive disorder, recurrent, moderate: Secondary | ICD-10-CM

## 2023-06-26 DIAGNOSIS — U071 COVID-19: Secondary | ICD-10-CM | POA: Diagnosis not present

## 2023-06-26 DIAGNOSIS — M545 Low back pain, unspecified: Secondary | ICD-10-CM | POA: Diagnosis not present

## 2023-06-26 DIAGNOSIS — R509 Fever, unspecified: Secondary | ICD-10-CM | POA: Diagnosis not present

## 2023-06-30 ENCOUNTER — Ambulatory Visit (HOSPITAL_COMMUNITY)
Admission: RE | Admit: 2023-06-30 | Discharge: 2023-06-30 | Disposition: A | Discharge status: Home / Self Care | Payer: Medicaid Other | Source: Ambulatory Visit | Attending: Urology | Admitting: Urology

## 2023-06-30 ENCOUNTER — Ambulatory Visit (HOSPITAL_COMMUNITY): Payer: Medicaid Other

## 2023-06-30 DIAGNOSIS — N200 Calculus of kidney: Secondary | ICD-10-CM

## 2023-06-30 DIAGNOSIS — R3129 Other microscopic hematuria: Secondary | ICD-10-CM | POA: Diagnosis not present

## 2023-06-30 DIAGNOSIS — Z720 Tobacco use: Secondary | ICD-10-CM | POA: Diagnosis not present

## 2023-06-30 DIAGNOSIS — R1032 Left lower quadrant pain: Secondary | ICD-10-CM

## 2023-07-01 NOTE — Progress Notes (Signed)
Please let pt know RUS showed no acute findings. There is a small left renal cyst and a tiny left lower pole stone. No hydronephrosis or concerning masses. Follow up as planned for cystoscopy to further evaluate microscopic hematuria.

## 2023-07-05 NOTE — Progress Notes (Signed)
Please let patient know KUB was negative but RUS showed a small left renal stone, as previously seen on CT on 05/18/2023. Stone may not be visible on x-ray due to composition (whatever it's made of). No acute findings with any of that imaging. Advised to follow up for cystoscopy as planned for further evaluation of microscopic hematuria.

## 2023-07-08 ENCOUNTER — Telehealth: Payer: Self-pay

## 2023-07-08 NOTE — Telephone Encounter (Signed)
My cover my meds Key [id number] PENDING UPDATE

## 2023-07-09 ENCOUNTER — Telehealth: Payer: Self-pay

## 2023-07-09 DIAGNOSIS — K219 Gastro-esophageal reflux disease without esophagitis: Secondary | ICD-10-CM | POA: Diagnosis not present

## 2023-07-09 DIAGNOSIS — K449 Diaphragmatic hernia without obstruction or gangrene: Secondary | ICD-10-CM | POA: Diagnosis not present

## 2023-07-09 DIAGNOSIS — C4371 Malignant melanoma of right lower limb, including hip: Secondary | ICD-10-CM | POA: Diagnosis not present

## 2023-07-09 NOTE — Telephone Encounter (Signed)
Denial for myrbetriq received, started by Maralyn Sago NP Per insurance meds listed below must be tried and failed first.

## 2023-07-09 NOTE — Telephone Encounter (Signed)
-----   Message from Nurse Jill Side sent at 07/08/2023  4:54 PM EDT ----- denial

## 2023-07-14 MED ORDER — SOLIFENACIN SUCCINATE 5 MG PO TABS: 5.0000 mg | ORAL_TABLET | Freq: Every day | ORAL | 11 refills | Status: DC

## 2023-07-30 ENCOUNTER — Other Ambulatory Visit: Payer: Medicaid Other | Admitting: *Deleted

## 2023-07-30 NOTE — Patient Instructions (Signed)
Dear Ms. Bail,  Thank you for taking time to speak with me today about care coordination and care management services available to you at no cost as part of your Medicaid benefit. These services are voluntary. Our team is available to provide assistance regarding your health care needs at any time. Please do not hesitate to reach out to me if we can be of service to you at any time in the future.   Estanislado Emms RN, BSN [place]  [facility] RN Care Coordinator [phone]

## 2023-07-30 NOTE — Patient Outreach (Signed)
Donna Munoz was referred to the [facility] Managed Care High Risk team for assistance with care coordination and care management services. Care coordination/care management services as part of the Medicaid benefit was offered to the patient today. The patient declined assistance offered today.   Plan: The Medicaid Managed Care High Risk team is available at any time in the future to assist with care coordination/care management services upon referral.   Estanislado Emms RN, BSN [place]  [facility] RN Care Coordinator [phone]

## 2023-08-04 DIAGNOSIS — R1319 Other dysphagia: Secondary | ICD-10-CM | POA: Diagnosis not present

## 2023-08-04 DIAGNOSIS — K449 Diaphragmatic hernia without obstruction or gangrene: Secondary | ICD-10-CM | POA: Diagnosis not present

## 2023-09-02 ENCOUNTER — Ambulatory Visit: Payer: Medicaid Other | Admitting: Internal Medicine

## 2023-09-08 ENCOUNTER — Other Ambulatory Visit: Payer: Medicaid Other | Admitting: Urology

## 2023-09-13 ENCOUNTER — Encounter: Payer: Self-pay | Admitting: Internal Medicine

## 2023-09-13 ENCOUNTER — Ambulatory Visit: Payer: Medicaid Other | Admitting: Internal Medicine

## 2023-09-13 VITALS — BP 98/65 | HR 69 | Ht 62.0 in | Wt 114.6 lb

## 2023-09-13 DIAGNOSIS — F331 Major depressive disorder, recurrent, moderate: Secondary | ICD-10-CM

## 2023-09-13 DIAGNOSIS — G629 Polyneuropathy, unspecified: Secondary | ICD-10-CM

## 2023-09-13 DIAGNOSIS — E032 Hypothyroidism due to medicaments and other exogenous substances: Secondary | ICD-10-CM | POA: Diagnosis not present

## 2023-09-13 DIAGNOSIS — K2100 Gastro-esophageal reflux disease with esophagitis, without bleeding: Secondary | ICD-10-CM

## 2023-09-13 DIAGNOSIS — Z720 Tobacco use: Secondary | ICD-10-CM

## 2023-09-13 DIAGNOSIS — Z1159 Encounter for screening for other viral diseases: Secondary | ICD-10-CM | POA: Diagnosis not present

## 2023-09-13 DIAGNOSIS — R739 Hyperglycemia, unspecified: Secondary | ICD-10-CM | POA: Diagnosis not present

## 2023-09-13 DIAGNOSIS — Z114 Encounter for screening for human immunodeficiency virus [HIV]: Secondary | ICD-10-CM | POA: Diagnosis not present

## 2023-09-13 MED ORDER — PANTOPRAZOLE SODIUM 40 MG PO TBEC
40.0000 mg | DELAYED_RELEASE_TABLET | Freq: Every day | ORAL | 3 refills | Status: DC
Start: 2023-09-13 — End: 2023-12-13

## 2023-09-13 NOTE — Assessment & Plan Note (Addendum)
Smokes about 0.5 pack/day  Asked about quitting: confirms that he/she currently smokes cigarettes Advise to quit smoking: Educated about QUITTING to reduce the risk of cancer, cardio and cerebrovascular disease. Assess willingness: Unwilling to quit at this time, but is working on cutting back. Assist with counseling and pharmacotherapy: Counseled for 5 minutes and literature provided. Arrange for follow up: follow up in 3 months and continue to offer help. 

## 2023-09-13 NOTE — Assessment & Plan Note (Signed)
Her epigastric pain and nausea is likely due to GERD with esophagitis Started Omeprazole, but could not get it?? Switched to Pantoprazole 40 mg once daily Follow up with GI Avoid hot and spicy food Needs to cut down soft drink intake and quit smoking

## 2023-09-13 NOTE — Progress Notes (Signed)
Established Patient Office Visit  Subjective:  Patient ID: Donna Munoz, female    DOB: 06-Nov-1976  Age: 47 y.o. MRN: [id number]  CC:  Chief Complaint  Patient presents with   Depression    Three month follow up     HPI Donna Munoz is a 47 y.o. 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  She completed immunotherapy with pembrolizumab in 2022.  She is followed by oncology currently.   Peripheral neuropathy: She has chronic numbness of the right LE > left LE.  She has tried gabapentin, but was causing mild drowsiness and stopped taking gabapentin now.  She also reports chronic, intermittent low back pain, worse with standing.  Denies any recent injury or fall.  She was given Cymbalta in the last visit, but had nausea with it.   She reports apathy, insomnia, decreased appetite, decreased concentration and easy irritability for the last 9 months.  She has tried amitriptyline in the past, which caused drowsiness.  Denies any SI or HI currently.  She was doing better with Cymbalta, but stopped taking it due to nausea.  Of note, she took it in the morning and usually does not take breakfast, does not eat anything until her lunchtime.   She also reports chronic nausea especially after eating.  She also reports epigastric discomfort.  Her recent CT abdomen showed signs of esophagitis.  She was recently evaluated by general surgery for hiatal hernia and was later referred to GI.  Denies any vomiting, melena or hematochezia.  She takes about 8 cans of [address] or Sprite almost daily.  She was given omeprazole in the last visit, but could not get it (?).     Past Medical History:  Diagnosis Date   Anemia    Anxiety    Depression    GERD (gastroesophageal reflux disease)    Headache    Shingles     Tobacco abuse 05/06/2020    Past Surgical History:  Procedure Laterality Date   AMPUTATION TOE Right 03/07/2020   Procedure: RIGHT THIRD TOE AMPUTATION;  Surgeon: Almond Lint, MD;  Location: [facility] OR;  Service: General;  Laterality: Right;   BREAST LUMPECTOMY  age 88   CESAREAN SECTION  01/06/2006   CESAREAN SECTION  10/15/2016   DENTAL SURGERY  2019   INGUINAL HERNIA REPAIR Right 01/30/2020   Procedure: RIGHT INGUINAL HERNIA REPAIR WITH  MESH;  Surgeon: Almond Lint, MD;  Location: [facility];  Service: General;  Laterality: Right;   INSERTION OF MESH Right 01/30/2020   Procedure: INSERTION OF MESH;  Surgeon: Almond Lint, MD;  Location: [facility];  Service: General;  Laterality: Right;   IR CV LINE INJECTION  09/11/2020   IR IMAGING GUIDED PORT INSERTION  09/12/2020   IR REMOVAL TUN ACCESS W/ PORT W/O FL MOD SED  09/12/2020   IRRIGATION AND DEBRIDEMENT ABSCESS Right 03/07/2020   Procedure: Aspiration of Right Groin Seroma;  Surgeon: Almond Lint, MD;  Location[facility];  Service: General;  Laterality: Right;   MELANOMA EXCISION WITH SENTINEL LYMPH NODE BIOPSY Right 01/30/2020   Procedure: WIDE LOWER EXCISION RIGHT THIRD TOE MELANOMA EXCISION WITH SENTINEL LYMPH NODE BIOPSY;  Surgeon: Almond Lint, MD;  Location: [facility];  Service: General;  Laterality: Right;   PORT-A-CATH REMOVAL  N/A 08/12/2022   Procedure: MINOR REMOVAL PORT-A-CATH;  Surgeon: Franky Macho, MD;  Location: [facility];  Service: General;  Laterality: N/A;   PORTACATH PLACEMENT N/A 03/07/2020   Procedure: INSERTION PORT-A-CATH;  Surgeon: Almond Lint, MD;  Location: [facility] OR;  Service: General;  Laterality: Left    Family History  Problem Relation Age of Onset   Heart disease Mother    Cancer Mother    Ovarian cysts Sister    Healthy Brother    Cancer Maternal Grandmother    Heart attack Maternal Grandmother    Cancer Maternal Grandfather    Cancer Paternal Grandmother     Healthy Sister    Healthy Sister    Psoriasis Daughter    Healthy Son     Social History   Socioeconomic History   Marital status: Legally Separated    Spouse name: Not on file   Number of children: 2   Years of education: Not on file   Highest education level: Not on file  Occupational History   Occupation: unemployed d/t COVID  Tobacco Use   Smoking status: Every Day    Current packs/day: 1.00    Average packs/day: 1 pack/day for 29.0 years (29.0 ttl pk-yrs)    Types: Cigarettes   Smokeless tobacco: Never  Vaping Use   Vaping status: Never Used  Substance and Sexual Activity   Alcohol use: No   Drug use: Yes    Types: Marijuana   Sexual activity: Yes  Other Topics Concern   Not on file  Social History Narrative   Not on file   Social Determinants of Health   Financial Resource Strain: High Risk (12/08/2019)   Overall Financial Resource Strain (CARDIA)    Difficulty of Paying Living Expenses: Hard  Food Insecurity: No Food Insecurity (12/08/2019)   Hunger Vital Sign    Worried About Running Out of Food in the Last Year: Never true    Ran Out of Food in the Last Year: Never true  Transportation Needs: No Transportation Needs (12/26/2020)   PRAPARE - Administrator, Civil Service (Medical): No    Lack of Transportation (Non-Medical): No  Physical Activity: Inactive (12/26/2020)   Exercise Vital Sign    Days of Exercise per Week: 0 days    Minutes of Exercise per Session: 0 min  Stress: Stress Concern Present (12/08/2019)   [employer] of Occupational Health - Occupational Stress Questionnaire    Feeling of Stress : Very much  Social Connections: Unknown (06/26/2023)   [facility]   Social Network    Social Network: Not on file  Intimate Partner Violence: Not At Risk (06/26/2023)   Received from [facility]   HITS    Over the last 12 months how often did your partner physically hurt you?: 1    Over the last 12 months how often  did your partner insult you or talk down to you?: 1    Over the last 12 months how often did your partner threaten you with physical harm?: 1    Over the last 12 months how often did your partner scream or curse at you?: 1    Outpatient Medications Prior to Visit  Medication Sig Dispense Refill   DULoxetine (CYMBALTA) 30 MG capsule TAKE 1 CAPSULE BY MOUTH EVERY DAY 90 capsule 1   levothyroxine (SYNTHROID) 25 MCG tablet Take 1 tablet (25 mcg total) by mouth daily before breakfast. 30 tablet 6   mirabegron ER (MYRBETRIQ) 25  MG TB24 tablet Take 1 tablet (25 mg total) by mouth daily. 30 tablet 11   solifenacin (VESICARE) 5 MG tablet Take 1 tablet (5 mg total) by mouth daily. 30 tablet 11   omeprazole (PRILOSEC) 40 MG capsule Take 1 capsule (40 mg total) by mouth daily. 30 capsule 3   No facility-administered medications prior to visit.    Allergies  Allergen Reactions   Gadavist [Gadobutrol] Nausea Only    Mild nausea but no vomiting after injection.  Ok after 5 minutes.    ROS Review of Systems  Constitutional:  Positive for fatigue. Negative for chills and fever.  HENT:  Negative for congestion, sinus pressure, sinus pain and sore throat.   Eyes:  Negative for pain and discharge.  Respiratory:  Negative for cough and shortness of breath.   Cardiovascular:  Negative for chest pain and palpitations.  Gastrointestinal:  Positive for abdominal pain (Mild, epigastric) and nausea. Negative for diarrhea and vomiting.  Endocrine: Negative for polydipsia and polyuria.  Genitourinary:  Negative for dysuria and hematuria.  Musculoskeletal:  Negative for neck pain and neck stiffness.  Skin:  Negative for rash.  Neurological:  Positive for numbness (B/l LE and UE). Negative for dizziness and weakness.  Psychiatric/Behavioral:  Positive for decreased concentration, dysphoric mood and sleep disturbance. Negative for agitation and behavioral problems. The patient is nervous/anxious.        Objective:    Physical Exam Vitals reviewed.  Constitutional:      General: She is not in acute distress.    Appearance: She is not diaphoretic.  HENT:     Head: Normocephalic and atraumatic.     Nose: Nose normal.     Mouth/Throat:     Mouth: Mucous membranes are moist.  Eyes:     General: No scleral icterus.    Extraocular Movements: Extraocular movements intact.  Cardiovascular:     Rate and Rhythm: Normal rate and regular rhythm.     Heart sounds: Normal heart sounds. No murmur heard. Pulmonary:     Breath sounds: Normal breath sounds. No wheezing or rales.  Abdominal:     Palpations: Abdomen is soft.     Tenderness: There is abdominal tenderness (Mild, epigastric).  Musculoskeletal:     Cervical back: Neck supple. No tenderness.     Right lower leg: No edema.     Left lower leg: No edema.  Skin:    General: Skin is warm.     Findings: No rash.  Neurological:     General: No focal deficit present.     Mental Status: She is alert and oriented to person, place, and time.     Sensory: Sensory deficit (RLE, from midshin to toes) present.     Motor: No weakness.  Psychiatric:        Mood and Affect: Mood normal.        Behavior: Behavior normal.     BP 98/65 (BP Location: Right Arm, Patient Position: Sitting, Cuff Size: Normal)   Pulse 69   Ht 5\' 2"  (1.575 m)   Wt 114 lb 9.6 oz (52 kg)   SpO2 98%   BMI 20.96 kg/m  Wt Readings from Last 3 Encounters:  09/13/23 114 lb 9.6 oz (52 kg)  06/02/23 113 lb 6.4 oz (51.4 kg)  05/24/23 112 lb 9.6 oz (51.1 kg)    Lab Results  Component Value Date   TSH 1.763 10/22/2022   Lab Results  Component Value Date   WBC 5.0 05/18/2023  HGB 14.0 05/18/2023   HCT 40.2 05/18/2023   MCV 93.7 05/18/2023   PLT 208 05/18/2023   Lab Results  Component Value Date   NA 135 05/18/2023   K 3.4 (L) 05/18/2023   CO2 21 (L) 05/18/2023   GLUCOSE 118 (H) 05/18/2023   BUN 7 05/18/2023   CREATININE 0.71 05/18/2023   BILITOT 0.7  05/18/2023   ALKPHOS 58 05/18/2023   AST 18 05/18/2023   ALT 14 05/18/2023   PROT 7.0 05/18/2023   ALBUMIN 4.1 05/18/2023   CALCIUM 9.6 05/18/2023   ANIONGAP 8 05/18/2023   Lab Results  Component Value Date   CHOL 144 06/17/2021   Lab Results  Component Value Date   HDL 29 (L) 06/17/2021   Lab Results  Component Value Date   LDLCALC 98 06/17/2021   Lab Results  Component Value Date   TRIG 86 06/17/2021   Lab Results  Component Value Date   CHOLHDL 5.0 06/17/2021   No results found for: "HGBA1C"    Assessment & Plan:   Problem List Items Addressed This Visit       Digestive   Gastroesophageal reflux disease with esophagitis without hemorrhage - Primary    Her epigastric pain and nausea is likely due to GERD with esophagitis Started Omeprazole, but could not get it?? Switched to Pantoprazole 40 mg once daily Follow up with GI Avoid hot and spicy food Needs to cut down soft drink intake and quit smoking      Relevant Medications   pantoprazole (PROTONIX) 40 MG tablet     Endocrine   Hypothyroidism    Lab Results  Component Value Date   TSH 1.763 10/22/2022   Was placed on levothyroxine in the past, she has stopped taking it since  12/23 Check TSH and free T4        Nervous and Auditory   Peripheral polyneuropathy    Chronic numbness of the RLE > LLE Likely due to polyneuropathy DPA pulse intact Started Cymbalta 30 mg QD for neuropathy and MDD - needs to restart it, can take it with food        Other   Tobacco abuse    Smokes about 0.5 pack/day  Asked about quitting: confirms that he/she currently smokes cigarettes Advise to quit smoking: Educated about QUITTING to reduce the risk of cancer, cardio and cerebrovascular disease. Assess willingness: Unwilling to quit at this time, but is working on cutting back. Assist with counseling and pharmacotherapy: Counseled for 5 minutes and literature provided. Arrange for follow up: follow up in 3  months and continue to offer help.      Moderate episode of recurrent major depressive disorder (HCC)    Uncontrolled, but was improving with Cymbalta Advised to take Cymbalta 30 mg once daily with food, for MDD and neuropathy       Meds ordered this encounter  Medications   pantoprazole (PROTONIX) 40 MG tablet    Sig: Take 1 tablet (40 mg total) by mouth daily.    Dispense:  30 tablet    Refill:  3    Follow-up: Return in about 4 months (around 01/11/2024) for GERD and MDD.    Anabel Halon, MD

## 2023-09-13 NOTE — Assessment & Plan Note (Signed)
Chronic numbness of the RLE > LLE Likely due to polyneuropathy DPA pulse intact Started Cymbalta 30 mg QD for neuropathy and MDD - needs to restart it, can take it with food

## 2023-09-13 NOTE — Assessment & Plan Note (Signed)
Lab Results  Component Value Date   TSH 1.763 10/22/2022   Was placed on levothyroxine in the past, she has stopped taking it since  12/23 Check TSH and free T4

## 2023-09-13 NOTE — Assessment & Plan Note (Signed)
Uncontrolled, but was improving with Cymbalta Advised to take Cymbalta 30 mg once daily with food, for MDD and neuropathy

## 2023-09-13 NOTE — Patient Instructions (Signed)
Please start taking Cymbalta with food.  Please start taking Pantoprazole before breakfast or lunch.  Please continue to take medications as prescribed.  Please continue to follow low salt diet and perform moderate exercise/walking at least 150 mins/week.

## 2023-09-14 ENCOUNTER — Other Ambulatory Visit: Payer: Self-pay | Admitting: Internal Medicine

## 2023-09-14 DIAGNOSIS — E559 Vitamin D deficiency, unspecified: Secondary | ICD-10-CM | POA: Insufficient documentation

## 2023-09-14 DIAGNOSIS — E538 Deficiency of other specified B group vitamins: Secondary | ICD-10-CM | POA: Insufficient documentation

## 2023-09-14 LAB — VITAMIN B12: Vitamin B-12: 177 pg/mL — ABNORMAL LOW (ref 232–1245)

## 2023-09-14 LAB — HIV ANTIBODY (ROUTINE TESTING W REFLEX): HIV Screen 4th Generation wRfx: NONREACTIVE

## 2023-09-14 LAB — VITAMIN D 25 HYDROXY (VIT D DEFICIENCY, FRACTURES): Vit D, 25-Hydroxy: 8.8 ng/mL — ABNORMAL LOW (ref 30.0–100.0)

## 2023-09-14 LAB — HEMOGLOBIN A1C
Est. average glucose Bld gHb Est-mCnc: 103 mg/dL
Hgb A1c MFr Bld: 5.2 % (ref 4.8–5.6)

## 2023-09-14 LAB — TSH+FREE T4
Free T4: 1.08 ng/dL (ref 0.82–1.77)
TSH: 2.12 u[IU]/mL (ref 0.450–4.500)

## 2023-09-14 LAB — HEPATITIS C ANTIBODY: Hep C Virus Ab: NONREACTIVE

## 2023-09-14 MED ORDER — VITAMIN D (ERGOCALCIFEROL) 1.25 MG (50000 UNIT) PO CAPS: 50000.0000 [IU] | ORAL_CAPSULE | ORAL | 1 refills | Status: DC

## 2023-09-27 DIAGNOSIS — D225 Melanocytic nevi of trunk: Secondary | ICD-10-CM | POA: Diagnosis not present

## 2023-09-27 DIAGNOSIS — Z85820 Personal history of malignant melanoma of skin: Secondary | ICD-10-CM | POA: Diagnosis not present

## 2023-09-27 DIAGNOSIS — Z08 Encounter for follow-up examination after completed treatment for malignant neoplasm: Secondary | ICD-10-CM | POA: Diagnosis not present

## 2023-09-27 DIAGNOSIS — Z872 Personal history of diseases of the skin and subcutaneous tissue: Secondary | ICD-10-CM | POA: Diagnosis not present

## 2023-09-27 DIAGNOSIS — L814 Other melanin hyperpigmentation: Secondary | ICD-10-CM | POA: Diagnosis not present

## 2023-09-27 DIAGNOSIS — R231 Pallor: Secondary | ICD-10-CM | POA: Diagnosis not present

## 2023-10-18 ENCOUNTER — Ambulatory Visit: Payer: Medicaid Other | Admitting: Urology

## 2023-10-18 VITALS — BP 104/69 | HR 69

## 2023-10-18 DIAGNOSIS — R3915 Urgency of urination: Secondary | ICD-10-CM

## 2023-10-18 DIAGNOSIS — R3129 Other microscopic hematuria: Secondary | ICD-10-CM | POA: Diagnosis not present

## 2023-10-18 LAB — URINALYSIS, ROUTINE W REFLEX MICROSCOPIC
Bilirubin, UA: NEGATIVE
Glucose, UA: NEGATIVE
Ketones, UA: NEGATIVE
Nitrite, UA: NEGATIVE
Protein,UA: NEGATIVE
RBC, UA: NEGATIVE
Specific Gravity, UA: 1.01 (ref 1.005–1.030)
Urobilinogen, Ur: 1 mg/dL (ref 0.2–1.0)
pH, UA: 6.5 (ref 5.0–7.5)

## 2023-10-18 LAB — MICROSCOPIC EXAMINATION
Bacteria, UA: NONE SEEN
Epithelial Cells (non renal): >10 /[HPF] — AB (ref 0–10)

## 2023-10-18 MED ORDER — GEMTESA 75 MG PO TABS: 1.0000 | ORAL_TABLET | Freq: Every day | ORAL | Status: DC

## 2023-10-18 NOTE — Progress Notes (Unsigned)
10/18/2023 2:28 PM   Lawson Radar [birth year] [id number]  Referring provider: Anabel Halon, MD [address] [place],  [place] [address]  No chief complaint on file.   HPI: She drinks 6-10 can of mountain dew daily. She has bothersome urinary urgency and frequency.    PMH: Past Medical History:  Diagnosis Date   Anemia    Anxiety    Depression    GERD (gastroesophageal reflux disease)    Headache    Shingles    Tobacco abuse 05/06/2020    Surgical History: Past Surgical History:  Procedure Laterality Date   AMPUTATION TOE Right 03/07/2020   Procedure: RIGHT THIRD TOE AMPUTATION;  Surgeon: Almond Lint, MD;  Location: [facility] OR;  Service: General;  Laterality: Right;   BREAST LUMPECTOMY  age 47   CESAREAN SECTION  01/06/2006   CESAREAN SECTION  10/15/2016   DENTAL SURGERY  2019   INGUINAL HERNIA REPAIR Right 01/30/2020   Procedure: RIGHT INGUINAL HERNIA REPAIR WITH  MESH;  Surgeon: Almond Lint, MD;  Location: [facility];  Service: General;  Laterality: Right;   INSERTION OF MESH Right 01/30/2020   Procedure: INSERTION OF MESH;  Surgeon: Almond Lint, MD;  Location: [facility];  Service: General;  Laterality: Right;   IR CV LINE INJECTION  09/11/2020   IR IMAGING GUIDED PORT INSERTION  09/12/2020   IR REMOVAL TUN ACCESS W/ PORT W/O FL MOD SED  09/12/2020   IRRIGATION AND DEBRIDEMENT ABSCESS Right 03/07/2020   Procedure: Aspiration of Right Groin Seroma;  Surgeon: Almond Lint, MD;  Location: [facility] OR;  Service: General;  Laterality: Right;   MELANOMA EXCISION WITH SENTINEL LYMPH NODE BIOPSY Right 01/30/2020   Procedure: WIDE LOWER EXCISION RIGHT THIRD TOE MELANOMA EXCISION WITH SENTINEL LYMPH NODE BIOPSY;  Surgeon: Almond Lint, MD;  Location: [facility];  Service: General;  Laterality: Right;   PORT-A-CATH REMOVAL N/A 08/12/2022   Procedure: MINOR REMOVAL PORT-A-CATH;  Surgeon: Franky Macho, MD;  Location: [facility];  Service:  General;  Laterality: N/A;   PORTACATH PLACEMENT N/A 03/07/2020   Procedure: INSERTION PORT-A-CATH;  Surgeon: Almond Lint, MD;  Location: [facility];  Service: General;  Laterality: Left    Home Medications:  Allergies as of 10/18/2023       Reactions   Gadavist [gadobutrol] Nausea Only   Mild nausea but no vomiting after injection.  Ok after 5 minutes.        Medication List        Accurate as of October 18, 2023  2:28 PM. If you have any questions, ask your nurse or doctor.          DULoxetine 30 MG capsule Commonly known as: CYMBALTA TAKE 1 CAPSULE BY MOUTH EVERY DAY   levothyroxine 25 MCG tablet Commonly known as: SYNTHROID Take 1 tablet (25 mcg total) by mouth daily before breakfast.   mirabegron ER 25 MG Tb24 tablet Commonly known as: MYRBETRIQ Take 1 tablet (25 mg total) by mouth daily.   pantoprazole 40 MG tablet Commonly known as: PROTONIX Take 1 tablet (40 mg total) by mouth daily.   solifenacin 5 MG tablet Commonly known as: VESIcare Take 1 tablet (5 mg total) by mouth daily.   Vitamin D (Ergocalciferol) 1.25 MG (50000 UNIT) Caps capsule Commonly known as: DRISDOL Take 1 capsule (50,000 Units total) by mouth every 7 (seven) days.        Allergies:  Allergies  Allergen Reactions   Gadavist [Gadobutrol]  Nausea Only    Mild nausea but no vomiting after injection.  Ok after 5 minutes.    Family History: Family History  Problem Relation Age of Onset   Heart disease Mother    Cancer Mother    Ovarian cysts Sister    Healthy Brother    Cancer Maternal Grandmother    Heart attack Maternal Grandmother    Cancer Maternal Grandfather    Cancer Paternal Grandmother    Healthy Sister    Healthy Sister    Psoriasis Daughter    Healthy Son     Social History:  reports that she has been smoking cigarettes. She has a 29 pack-year smoking history. She has never used smokeless tobacco. She reports current drug use. Drug: Marijuana. She reports that she  does not drink alcohol.  ROS: All other review of systems were reviewed and are negative except what is noted above in HPI  Physical Exam: BP 104/69   Pulse 69   Constitutional:  Alert and oriented, No acute distress. HEENT: [place] AT, moist mucus membranes.  Trachea midline, no masses. Cardiovascular: No clubbing, cyanosis, or edema. Respiratory: Normal respiratory effort, no increased work of breathing. GI: Abdomen is soft, nontender, nondistended, no abdominal masses GU: No CVA tenderness.  Lymph: No cervical or inguinal lymphadenopathy. Skin: No rashes, bruises or suspicious lesions. Neurologic: Grossly intact, no focal deficits, moving all 4 extremities. Psychiatric: Normal mood and affect.  Laboratory Data: Lab Results  Component Value Date   WBC 5.0 05/18/2023   HGB 14.0 05/18/2023   HCT 40.2 05/18/2023   MCV 93.7 05/18/2023   PLT 208 05/18/2023    Lab Results  Component Value Date   CREATININE 0.71 05/18/2023    No results found for: "PSA"  No results found for: "TESTOSTERONE"  Lab Results  Component Value Date   HGBA1C 5.2 09/13/2023    Urinalysis    Component Value Date/Time   APPEARANCEUR Clear 06/22/2023 1013   GLUCOSEU Negative 06/22/2023 1013   BILIRUBINUR Negative 06/22/2023 1013   PROTEINUR Negative 06/22/2023 1013   NITRITE Negative 06/22/2023 1013   LEUKOCYTESUR Negative 06/22/2023 1013    Lab Results  Component Value Date   LABMICR See below: 06/22/2023   WBCUA 0-5 06/22/2023   LABEPIT 0-10 06/22/2023   BACTERIA None seen 06/22/2023    Pertinent Imaging: *** Results for orders placed during the hospital encounter of 06/30/23  DG Abd 1 View  Narrative CLINICAL DATA:  Left kidney stone  EXAM: ABDOMEN - 1 VIEW  COMPARISON:  None Available.  FINDINGS: The bowel gas pattern is normal. No radio-opaque calculi or other significant radiographic abnormality are seen.  IMPRESSION: Negative.   Electronically Signed By: Layla Maw M.D. On: 07/04/2023 01:41  No results found for this or any previous visit.  No results found for this or any previous visit.  No results found for this or any previous visit.  Results for orders placed during the hospital encounter of 06/30/23  US RENAL  Narrative CLINICAL DATA:  Microscopic hematuria  EXAM: RENAL / URINARY TRACT ULTRASOUND COMPLETE  COMPARISON:  CT C [facility] 05/18/2023  FINDINGS: Right Kidney:  Renal measurements: 11.5 x 3.9 x 6.3 cm = volume: 149 mL. Echogenicity within normal limits. No mass or hydronephrosis visualized.  Left Kidney:  Renal measurements: 11.1 x 5.5 x 5.5 cm = volume: 177.1 mL. Normal renal cortical thickness and echogenicity. No hydronephrosis. There is a 0.9 cm simple cyst. No imaging follow-up needed. There is a  2 mm stone inferior pole.  Bladder:  Appears normal for degree of bladder distention.  Other:  None.  IMPRESSION: 1. No hydronephrosis. 2. Small left renal stone.   Electronically Signed By: Annia Belt M.D. On: 06/30/2023 20:12  No valid procedures specified. No results found for this or any previous visit.  No results found for this or any previous visit.   Assessment & Plan:    1. Microscopic hematuria ***  2. Urge incontinence   No follow-ups on file.  Wilkie Aye, MD  [facility] [place]

## 2023-10-19 ENCOUNTER — Encounter: Payer: Self-pay | Admitting: Urology

## 2023-10-19 NOTE — Patient Instructions (Signed)

## 2023-10-20 LAB — BASIC METABOLIC PANEL
BUN/Creatinine Ratio: 8 — ABNORMAL LOW (ref 9–23)
BUN: 6 mg/dL (ref 6–24)
CO2: 23 mmol/L (ref 20–29)
Calcium: 9.9 mg/dL (ref 8.7–10.2)
Chloride: 104 mmol/L (ref 96–106)
Creatinine, Ser: 0.75 mg/dL (ref 0.57–1.00)
Glucose: 79 mg/dL (ref 70–99)
Potassium: 4 mmol/L (ref 3.5–5.2)
Sodium: 140 mmol/L (ref 134–144)
eGFR: 100 mL/min/{1.73_m2} (ref >59)

## 2023-10-21 LAB — URINE CULTURE

## 2023-10-22 ENCOUNTER — Telehealth: Payer: Self-pay

## 2023-10-22 MED ORDER — SULFAMETHOXAZOLE-TRIMETHOPRIM 800-160 MG PO TABS: 1.0000 | ORAL_TABLET | Freq: Two times a day (BID) | ORAL | 0 refills | Status: DC

## 2023-10-22 NOTE — Telephone Encounter (Signed)
Patient made aware of positive urine culture and antibiotic sent to pharmacy.

## 2023-11-22 ENCOUNTER — Encounter: Payer: Self-pay | Admitting: Gastroenterology

## 2023-11-22 ENCOUNTER — Inpatient Hospital Stay: Payer: Medicaid Other | Attending: Hematology

## 2023-11-22 ENCOUNTER — Ambulatory Visit: Payer: Medicaid Other | Admitting: Gastroenterology

## 2023-11-22 VITALS — BP 100/60 | HR 73 | Ht 62.0 in | Wt 110.0 lb

## 2023-11-22 DIAGNOSIS — D649 Anemia, unspecified: Secondary | ICD-10-CM | POA: Insufficient documentation

## 2023-11-22 DIAGNOSIS — R630 Anorexia: Secondary | ICD-10-CM

## 2023-11-22 DIAGNOSIS — R634 Abnormal weight loss: Secondary | ICD-10-CM

## 2023-11-22 DIAGNOSIS — Z85820 Personal history of malignant melanoma of skin: Secondary | ICD-10-CM

## 2023-11-22 DIAGNOSIS — D509 Iron deficiency anemia, unspecified: Secondary | ICD-10-CM

## 2023-11-22 DIAGNOSIS — F1290 Cannabis use, unspecified, uncomplicated: Secondary | ICD-10-CM | POA: Diagnosis not present

## 2023-11-22 DIAGNOSIS — F17210 Nicotine dependence, cigarettes, uncomplicated: Secondary | ICD-10-CM | POA: Insufficient documentation

## 2023-11-22 DIAGNOSIS — Z1211 Encounter for screening for malignant neoplasm of colon: Secondary | ICD-10-CM

## 2023-11-22 DIAGNOSIS — Z79899 Other long term (current) drug therapy: Secondary | ICD-10-CM | POA: Insufficient documentation

## 2023-11-22 DIAGNOSIS — C4371 Malignant melanoma of right lower limb, including hip: Secondary | ICD-10-CM

## 2023-11-22 DIAGNOSIS — R112 Nausea with vomiting, unspecified: Secondary | ICD-10-CM | POA: Diagnosis not present

## 2023-11-22 DIAGNOSIS — E032 Hypothyroidism due to medicaments and other exogenous substances: Secondary | ICD-10-CM

## 2023-11-22 DIAGNOSIS — E039 Hypothyroidism, unspecified: Secondary | ICD-10-CM | POA: Diagnosis not present

## 2023-11-22 DIAGNOSIS — K219 Gastro-esophageal reflux disease without esophagitis: Secondary | ICD-10-CM | POA: Diagnosis not present

## 2023-11-22 LAB — CBC WITH DIFFERENTIAL/PLATELET
Abs Immature Granulocytes: 0.01 10*3/uL (ref 0.00–0.07)
Basophils Absolute: 0 10*3/uL (ref 0.0–0.1)
Basophils Relative: 1 %
Eosinophils Absolute: 0.1 10*3/uL (ref 0.0–0.5)
Eosinophils Relative: 2 %
HCT: 45.2 % (ref 36.0–46.0)
Hemoglobin: 15.2 g/dL — ABNORMAL HIGH (ref 12.0–15.0)
Immature Granulocytes: 0 %
Lymphocytes Relative: 29 %
Lymphs Abs: 1.4 10*3/uL (ref 0.7–4.0)
MCH: 31.9 pg (ref 26.0–34.0)
MCHC: 33.6 g/dL (ref 30.0–36.0)
MCV: 95 fL (ref 80.0–100.0)
Monocytes Absolute: 0.3 10*3/uL (ref 0.1–1.0)
Monocytes Relative: 6 %
Neutro Abs: 3.2 10*3/uL (ref 1.7–7.7)
Neutrophils Relative %: 62 %
Platelets: 264 10*3/uL (ref 150–400)
RBC: 4.76 MIL/uL (ref 3.87–5.11)
RDW: 12.4 % (ref 11.5–15.5)
WBC: 5.1 10*3/uL (ref 4.0–10.5)
nRBC: 0 % (ref 0.0–0.2)

## 2023-11-22 LAB — COMPREHENSIVE METABOLIC PANEL
ALT: 15 U/L (ref 0–44)
AST: 19 U/L (ref 15–41)
Albumin: 4.5 g/dL (ref 3.5–5.0)
Alkaline Phosphatase: 61 U/L (ref 38–126)
Anion gap: 7 (ref 5–15)
BUN: 15 mg/dL (ref 6–20)
CO2: 22 mmol/L (ref 22–32)
Calcium: 10.1 mg/dL (ref 8.9–10.3)
Chloride: 104 mmol/L (ref 98–111)
Creatinine, Ser: 0.74 mg/dL (ref 0.44–1.00)
GFR, Estimated: >60 mL/min (ref >60)
Glucose, Bld: 129 mg/dL — ABNORMAL HIGH (ref 70–99)
Potassium: 3.9 mmol/L (ref 3.5–5.1)
Sodium: 133 mmol/L — ABNORMAL LOW (ref 135–145)
Total Bilirubin: 0.5 mg/dL (ref 0.0–1.2)
Total Protein: 7.8 g/dL (ref 6.5–8.1)

## 2023-11-22 LAB — IRON AND TIBC
Iron: 36 ug/dL (ref 28–170)
Saturation Ratios: 9 % — ABNORMAL LOW (ref 10.4–31.8)
TIBC: 426 ug/dL (ref 250–450)
UIBC: 390 ug/dL

## 2023-11-22 LAB — TSH: TSH: 3.164 u[IU]/mL (ref 0.350–4.500)

## 2023-11-22 LAB — FERRITIN: Ferritin: 31 ng/mL (ref 11–307)

## 2023-11-22 LAB — LACTATE DEHYDROGENASE: LDH: 96 U/L — ABNORMAL LOW (ref 98–192)

## 2023-11-22 NOTE — Patient Instructions (Addendum)
 _______________________________________________________  If your blood pressure at your visit was 140/90 or greater, please contact your primary care physician to follow up on this.  If you are age 46 or younger, your body mass index should be between 19-25. Your Body mass index is 20.12 kg/m. If this is out of the aformentioned range listed, please consider follow up with your Primary Care Provider.  ________________________________________________________  The [facility] providers would like to encourage you to use MYCHART to communicate with providers for non-urgent requests or questions.  Due to long hold times on the telephone, sending your provider a message by [facility] may be a faster and more efficient way to get a response.  Please allow 48 business hours for a response.  Please remember that this is for non-urgent requests.  _______________________________________________________  Rosine have been scheduled for an endoscopy and colonoscopy. Please follow the written instructions given to you at your visit today.  Please pick up your prep supplies at the pharmacy within the next 1-3 days.  If you use inhalers (even only as needed), please bring them with you on the day of your procedure.  DO NOT TAKE 7 DAYS PRIOR TO TEST- Trulicity (dulaglutide) Ozempic, Wegovy (semaglutide) Mounjaro (tirzepatide) Bydureon Bcise (exanatide extended release)  DO NOT TAKE 1 DAY PRIOR TO YOUR TEST Rybelsus (semaglutide) Adlyxin (lixisenatide) Victoza (liraglutide) Byetta (exanatide) ___________________________________________________________________________  Due to recent changes in healthcare laws, you may see the results of your imaging and laboratory studies on MyChart before your provider has had a chance to review them.  We understand that in some cases there may be results that are confusing or concerning to you. Not all laboratory results come back in the same time frame and the provider may be  waiting for multiple results in order to interpret others.  Please give us  48 hours in order for your provider to thoroughly review all the results before contacting the office for clarification of your results.   Thank you for entrusting me with your care and choosing [facility].  Dr Stacia

## 2023-11-22 NOTE — Progress Notes (Signed)
 Discussed the use of AI scribe software for clinical note transcription with the patient, who gave verbal consent to proceed.  HPI : Donna Munoz is a 48 y.o. female with a history of anxiety, depression and melanoma who is referred to us  by Tobie Suzzane POUR, MD for further evaluation of chronic nausea and vomiting, suspect GERD. The symptoms have been ongoing for several years, but have significantly worsened over the past five months. The patient describes difficulty swallowing certain foods, feeling as though the food is 'just sitting' and often regurgitates food and drink shortly after consumption. The patient denies any associated acid taste during these episodes. She frequently feels a fullness in her chest after eating, which then results in regurgitation and often retching/vomiting episodes.  She has not noticed any particular foods causing more symptoms and others.  She states it does not seem to matter what she eats, she will have nausea and regurgitation.  She is frequently waking from sleep with reflux symptoms.  The patient also reports abdominal pain, described as a tight sensation, regardless of the quantity of food consumed. This is accompanied by a sensation of bloating and hardness in the abdomen. The patient has unintentionally lost weight, with a decrease from a baseline of 115-117 lbs to 111 lbs over the past year.  The patient's bowel habits have also changed over the past two months, with an increase in frequency to two or three times daily and a change in consistency to a very soft stool. The patient denies any blood in the stool.  The patient has been on long-term pantoprazole  for presumed reflux, but reports no noticeable improvement in symptoms. The patient also reports a history of smoking and regular marijuana use.  She has noted sometimes feeling very poorly after smoking marijuana recently.  She states that she started using marijuana when she was getting treated for her  melanoma to improve her appetite.  The patient has a family history of various cancers, including throat cancer in the mother and colon cancer in the paternal grandfather. The patient has not yet undergone any colon cancer screening.        CT Abdomen/Pelvis Dec 2023 IMPRESSION: 1. No evidence of metastatic disease within the chest, abdomen, or pelvis. 2. Tiny hiatal hernia with mild symmetric distal esophageal wall thickening, suggestive of reflux esophagitis. 3. Moderate volume of formed stool throughout the colon. Correlate for constipation. 4. Punctate nonobstructive left lower pole renal stone. 5. Aortic Atherosclerosis (ICD10-I70.0) and Emphysema (ICD10-J43.9)  Past Medical History:  Diagnosis Date   Anemia    Anxiety    Depression    GERD (gastroesophageal reflux disease)    Headache    Shingles    Tobacco abuse 05/06/2020     Past Surgical History:  Procedure Laterality Date   AMPUTATION TOE Right 03/07/2020   Procedure: RIGHT THIRD TOE AMPUTATION;  Surgeon: Aron Shoulders, MD;  Location: [facility] OR;  Service: General;  Laterality: Right;   BREAST LUMPECTOMY  age 26   CESAREAN SECTION  01/06/2006   CESAREAN SECTION  10/15/2016   DENTAL SURGERY  2019   INGUINAL HERNIA REPAIR Right 01/30/2020   Procedure: RIGHT INGUINAL HERNIA REPAIR WITH  MESH;  Surgeon: Aron Shoulders, MD;  Location: [facility];  Service: General;  Laterality: Right;   INSERTION OF MESH Right 01/30/2020   Procedure: INSERTION OF MESH;  Surgeon: Aron Shoulders, MD;  Location: [facility];  Service: General;  Laterality: Right;   IR CV  LINE INJECTION  09/11/2020   IR IMAGING GUIDED PORT INSERTION  09/12/2020   IR REMOVAL TUN ACCESS W/ PORT W/O FL MOD SED  09/12/2020   IRRIGATION AND DEBRIDEMENT ABSCESS Right 03/07/2020   Procedure: Aspiration of Right Groin Seroma;  Surgeon: Aron Shoulders, MD;  Location[facility];  Service: General;  Laterality: Right;   MELANOMA EXCISION WITH SENTINEL  LYMPH NODE BIOPSY Right 01/30/2020   Procedure: WIDE LOWER EXCISION RIGHT THIRD TOE MELANOMA EXCISION WITH SENTINEL LYMPH NODE BIOPSY;  Surgeon: Aron Shoulders, MD;  Location: [facility];  Service: General;  Laterality: Right;   PORT-A-CATH REMOVAL N/A 08/12/2022   Procedure: MINOR REMOVAL PORT-A-CATH;  Surgeon: Mavis Anes, MD;  Location: [facility];  Service: General;  Laterality: N/A;   PORTACATH PLACEMENT N/A 03/07/2020   Procedure: INSERTION PORT-A-CATH;  Surgeon: Aron Shoulders, MD;  Location: [facility] OR;  Service: General;  Laterality: Left   Family History  Problem Relation Age of Onset   Heart disease Mother    Cancer Mother    Ovarian cysts Sister    Healthy Brother    Cancer Maternal Grandmother    Heart attack Maternal Grandmother    Cancer Maternal Grandfather    Cancer Paternal Grandmother    Healthy Sister    Healthy Sister    Psoriasis Daughter    Healthy Son    Social History   Tobacco Use   Smoking status: Every Day    Current packs/day: 1.00    Average packs/day: 1 pack/day for 29.0 years (29.0 ttl pk-yrs)    Types: Cigarettes   Smokeless tobacco: Never  Vaping Use   Vaping status: Never Used  Substance Use Topics   Alcohol use: No   Drug use: Yes    Types: Marijuana   Current Outpatient Medications  Medication Sig Dispense Refill   mirabegron  ER (MYRBETRIQ ) 25 MG TB24 tablet Take 1 tablet (25 mg total) by mouth daily. 30 tablet 11   pantoprazole  (PROTONIX ) 40 MG tablet Take 1 tablet (40 mg total) by mouth daily. 30 tablet 3   solifenacin  (VESICARE ) 5 MG tablet Take 1 tablet (5 mg total) by mouth daily. 30 tablet 11   sulfamethoxazole -trimethoprim  (BACTRIM  DS) 800-160 MG tablet Take 1 tablet by mouth 2 (two) times daily. 14 tablet 0   Vibegron  (GEMTESA ) 75 MG TABS Take 1 tablet (75 mg total) by mouth daily.     Vitamin D , Ergocalciferol , (DRISDOL ) 1.25 MG (50000 UNIT) CAPS capsule Take 1 capsule (50,000 Units total) by mouth every 7 (seven) days. 12  capsule 1   DULoxetine  (CYMBALTA ) 30 MG capsule TAKE 1 CAPSULE BY MOUTH EVERY DAY (Patient not taking: Reported on 11/22/2023) 90 capsule 1   levothyroxine  (SYNTHROID ) 25 MCG tablet Take 1 tablet (25 mcg total) by mouth daily before breakfast. 30 tablet 6   No current facility-administered medications for this visit.   Allergies  Allergen Reactions   Gadavist  [Gadobutrol ] Nausea Only    Mild nausea but no vomiting after injection.  Ok after 5 minutes.     Review of Systems: All systems reviewed and negative except where noted in HPI.    No results found.  Physical Exam: BP 100/60 (BP Location: Left Arm, Patient Position: Sitting, Cuff Size: Normal)   Pulse 73   Ht 5' 2 (1.575 m)   Wt 110 lb (49.9 kg)   SpO2 98%   BMI 20.12 kg/m  Constitutional: Pleasant,well-developed, Caucasian female in no acute distress. HEENT: Normocephalic and atraumatic. Conjunctivae are normal.  No scleral icterus.  Dentures Neck supple.  Cardiovascular: Normal rate, regular rhythm.  Pulmonary/chest: Effort normal and breath sounds normal. No wheezing, rales or rhonchi. Abdominal: Soft, nondistended, nontender. Bowel sounds active throughout. There are no masses palpable. No hepatomegaly. Extremities: no edema Neurological: Alert and oriented to person place and time. Skin: Skin is warm and dry. No rashes noted. Psychiatric: Normal mood and affect. Behavior is normal.  CBC    Component Value Date/Time   WBC 5.1 11/22/2023 0954   RBC 4.76 11/22/2023 0954   HGB 15.2 (H) 11/22/2023 0954   HGB 12.5 08/29/2007 1545   HCT 45.2 11/22/2023 0954   HCT 37.0 08/29/2007 1545   PLT 264 11/22/2023 0954   PLT 277 08/29/2007 1545   MCV 95.0 11/22/2023 0954   MCV 84.5 08/29/2007 1545   MCH 31.9 11/22/2023 0954   MCHC 33.6 11/22/2023 0954   RDW 12.4 11/22/2023 0954   RDW 16.2 (H) 08/29/2007 1545   LYMPHSABS 1.4 11/22/2023 0954   LYMPHSABS 2.3 08/29/2007 1545   MONOABS 0.3 11/22/2023 0954   MONOABS 0.3  08/29/2007 1545   EOSABS 0.1 11/22/2023 0954   EOSABS 0.2 08/29/2007 1545   BASOSABS 0.0 11/22/2023 0954   BASOSABS 0.0 08/29/2007 1545    CMP     Component Value Date/Time   NA 133 (L) 11/22/2023 0954   NA 140 10/18/2023 1430   K 3.9 11/22/2023 0954   CL 104 11/22/2023 0954   CO2 22 11/22/2023 0954   GLUCOSE 129 (H) 11/22/2023 0954   BUN 15 11/22/2023 0954   BUN 6 10/18/2023 1430   CREATININE 0.74 11/22/2023 0954   CALCIUM 10.1 11/22/2023 0954   PROT 7.8 11/22/2023 0954   ALBUMIN 4.5 11/22/2023 0954   AST 19 11/22/2023 0954   ALT 15 11/22/2023 0954   ALKPHOS 61 11/22/2023 0954   BILITOT 0.5 11/22/2023 0954   GFRNONAA >60 11/22/2023 0954   GFRAA >60 07/30/2020 1231       Latest Ref Rng & Units 11/22/2023    9:54 AM 05/18/2023    8:46 AM 10/22/2022   10:09 AM  CBC EXTENDED  WBC 4.0 - 10.5 K/uL 5.1  5.0  5.7   RBC 3.87 - 5.11 MIL/uL 4.76  4.29  4.04   Hemoglobin 12.0 - 15.0 g/dL 84.7  85.9  88.0   HCT 36.0 - 46.0 % 45.2  40.2  37.1   Platelets 150 - 400 K/uL 264  208  227   NEUT# 1.7 - 7.7 K/uL 3.2  2.9  3.6   Lymph# 0.7 - 4.0 K/uL 1.4  1.6  1.6       ASSESSMENT AND PLAN:  48 year old female with several years of recurring nausea and vomiting, chest discomfort/fullness and dysphagia.  Suspect acid reflux is a major contributor to her nausea and vomiting, but patient has other potential etiologies to explain her symptoms such as chronic marijuana use and chronic underlying anxiety.  Gastroesophageal Reflux Disease (GERD)/Epigastric pain Chronic upper GI symptoms worsening symptoms over the last three years, including dysphagia, nausea, vomiting, and transient food impaction. Symptoms exacerbated by certain foods and drinks. Current treatment with pantoprazole  ineffective. We discussed the pathophysiology of GERD and the principles of GERD management to include lifestyle modifications  such as dietary discretion (avoidance of alcohol, tobacco, caffeinated and carbonated  beverages, spicy/greasy foods, citrus, peppermint/chocolate), weight loss if applicable, head of bed elevation andconsuming last meal of day within 3 hours of bedtime; pharmacologic options to include PPIs, H2RAs and  OTC antacids; and finally surgical or endoscopic fundoplication. EGD warranted to evaluate for peptic stricture/ulcer and other complications of GERD.  Will perform dilation if stricture appreciated.  Will try alternative PPI to see if this is more effective. - Switch pantoprazole  to omeprazole  - Schedule upper endoscopy - Provide dietary counseling to avoid reflux triggers (alcohol, tobacco, caffeine, chocolate, peppermint, tomato-based foods, spicy foods, citrus foods)  Chronic Nausea and Vomiting Chronic nausea and vomiting, potentially multifactorial, including GERD, anxiety, and marijuana use. Patient reports marijuana use for nausea management during cancer treatment but acknowledges it may now exacerbate symptoms. Discussed potential for marijuana to cause paradoxical nausea and vomiting and advised cessation.  Reflux also may be contributing to nausea and vomiting episodes. - Advise cessation of marijuana use - Evaluate for other causes during upper endoscopy - Follow up post-procedure to discuss further management options  Colon cancer screening Patient due for initial average risk screening colonoscopy.  She has a family history of numerous cancers but no first-degree relatives with colon cancer. -Schedule colonoscopy  Unintentional Weight Loss Progressive weight loss over the past year, significantly below baseline, associated with decreased appetite and nausea. Potential contributing factors include GERD, anxiety, and previous cancer treatment. Comprehensive evaluation during upper endoscopy needed to identify underlying causes. - Evaluate for underlying causes during upper endoscopy - Monitor weight and nutritional status  Melanoma in Remission Melanoma in remission  post-immunotherapy completed two and a half years ago. No new symptoms suggestive of recurrence. - Continue regular follow-up with oncology  Follow-up - Schedule follow-up appointment after upper endoscopy and colonoscopy.      The details, risks (including bleeding, perforation, infection, missed lesions, medication reactions and possible hospitalization or surgery if complications occur), benefits, and alternatives to colonoscopy with possible biopsy and possible polypectomy were discussed with the patient and she consents to proceed.    Liandra Mendia E. Stacia, MD [facility]  I spent a total of 45 minutes reviewing the patient's medical record, interviewing and examining the patient, discussing her diagnosis and management of her condition going forward, and documenting in the medical record   Tobie Suzzane POUR, MD

## 2023-11-28 NOTE — Progress Notes (Signed)
[facility] [address] [place] [address]    Clinic Day:  11/29/2023  Referring physician: No ref. provider found  Patient Care Team: Anabel Halon, MD as PCP - General (Internal Medicine) Pricilla Riffle, MD as PCP - Cardiology (Cardiology)   ASSESSMENT & PLAN:   Assessment: 1.  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 -She underwent right third toe amputation at PIP level on 03/07/2020 for positive margins.  Pathology did not show any residual melanoma. -CT chest and abdomen on 03/21/2020 did not show any evidence of metastatic disease. - Adjuvant pembrolizumab from 03/25/2020 through 04/02/2021   2.  Family history: -Mother died of throat cancer.  Maternal grandmother had breast cancer.  Maternal aunt had pancreatic cancer.  Maternal grandfather had bone cancer.  Maternal great grandmother died of melanoma. -She will benefit from genetic testing.   3.  Right thyroid nodule: -PET scan on 12/20/2019 showed uptake in the right thyroid. -Ultrasound on 02/05/2020 did not show any suspicious nodules. -FNA on 05/01/2020 showed benign follicular nodule, Bethesda category 2.    Plan: 1.  Stage IIIb (T2B N2A) malignant melanoma of the right third toe, BRAF V6 100 E-: - She does not have any B symptoms or new onset pains. - No signs or symptoms of recurrence. - Reviewed labs from 11/22/2023: Normal LFTs and LDH.  CBC grossly normal. - Physical exam today: No palpable adenopathy or organomegaly. - Recommend follow-up in 6 months with repeat labs and CT CAP.  Continue with dermatology visits once a year.   2.  Normocytic anemia: - Last Feraheme was on 11/12/2022.  Ferritin is 31 and hemoglobin normal at 15.2.   3.  Hypothyroidism: - TSH is normal at 3.1.  She is no longer  on Synthroid..    Orders Placed This Encounter  Procedures   CT CHEST ABDOMEN PELVIS W CONTRAST    Standing Status:   Future    Expected Date:   05/28/2024    Expiration Date:   11/28/2024    If indicated for the ordered procedure, I authorize the administration of contrast media per Radiology protocol:   Yes    Does the patient have a contrast media/X-ray dye allergy?:   No    Preferred imaging location?:   [facility]    If indicated for the ordered procedure, I authorize the administration of oral contrast media per Radiology protocol:   Yes   CBC with Differential    Standing Status:   Future    Expected Date:   05/22/2024    Expiration Date:   11/28/2024   Comprehensive metabolic panel    Standing Status:   Future    Expected Date:   05/22/2024    Expiration Date:   11/28/2024   Lactate dehydrogenase    Standing Status:   Future    Expected Date:   05/22/2024    Expiration Date:   11/28/2024   TSH    Standing Status:   Future    Expected Date:   05/22/2024    Expiration Date:   11/28/2024   Iron and TIBC ([facility] ONLY)    Standing Status:   Future    Expected Date:   05/22/2024    Expiration Date:   11/28/2024   Ferritin  Standing Status:   Future    Expected Date:   05/22/2024    Expiration Date:   11/28/2024      I,Katie Daubenspeck,acting as a scribe for Doreatha Massed, MD.,have documented all relevant documentation on the behalf of Doreatha Massed, MD,as directed by  Doreatha Massed, MD while in the presence of Doreatha Massed, MD.   I, Doreatha Massed MD, have reviewed the above documentation for accuracy and completeness, and I agree with the above.   Doreatha Massed, MD   1/20/202511:32 AM  CHIEF COMPLAINT:   Diagnosis: malignant melanoma    Cancer Staging  Melanoma of skin (HCC) Staging form: Melanoma of the Skin, AJCC 8th Edition - Clinical stage from 02/08/2020: Stage III (cT2b, cN2a, cM0) - Unsigned     Prior Therapy: Adjuvant Keytruda  Current Therapy: Observation   HISTORY OF PRESENT ILLNESS:   Oncology History  Melanoma of skin (HCC)  12/11/2019 Initial Diagnosis   Melanoma of skin (HCC)   02/19/2020 Genetic Testing   BRAF Mutation Analysis     03/25/2020 - 04/02/2021 Chemotherapy   Patient is on Treatment Plan : MELANOMA Pembrolizumab q21d        INTERVAL HISTORY:   Donna Munoz is a 48 y.o. female presenting to clinic today for follow up of malignant melanoma. She was last seen by me on 05/24/23.  Today, she states that she is doing well overall. Her appetite level is at 25%. Her energy level is at 25%.  PAST MEDICAL HISTORY:   Past Medical History: Past Medical History:  Diagnosis Date   Anemia    Anxiety    Depression    GERD (gastroesophageal reflux disease)    Headache    Shingles    Tobacco abuse 05/06/2020    Surgical History: Past Surgical History:  Procedure Laterality Date   AMPUTATION TOE Right 03/07/2020   Procedure: RIGHT THIRD TOE AMPUTATION;  Surgeon: Almond Lint, MD;  Location: [facility] OR;  Service: General;  Laterality: Right;   BREAST LUMPECTOMY  age 66   CESAREAN SECTION  01/06/2006   CESAREAN SECTION  10/15/2016   DENTAL SURGERY  2019   INGUINAL HERNIA REPAIR Right 01/30/2020   Procedure: RIGHT INGUINAL HERNIA REPAIR WITH  MESH;  Surgeon: Almond Lint, MD;  Location: [facility];  Service: General;  Laterality: Right;   INSERTION OF MESH Right 01/30/2020   Procedure: INSERTION OF MESH;  Surgeon: Almond Lint, MD;  Location: [facility];  Service: General;  Laterality: Right;   IR CV LINE INJECTION  09/11/2020   IR IMAGING GUIDED PORT INSERTION  09/12/2020   IR REMOVAL TUN ACCESS W/ PORT W/O FL MOD SED  09/12/2020   IRRIGATION AND DEBRIDEMENT ABSCESS Right 03/07/2020   Procedure: Aspiration of Right Groin Seroma;  Surgeon: Almond Lint, MD;  Location: [facility];  Service: General;  Laterality: Right;   MELANOMA EXCISION WITH  SENTINEL LYMPH NODE BIOPSY Right 01/30/2020   Procedure: WIDE LOWER EXCISION RIGHT THIRD TOE MELANOMA EXCISION WITH SENTINEL LYMPH NODE BIOPSY;  Surgeon: Almond Lint, MD;  Location: [facility];  Service: General;  Laterality: Right;   PORT-A-CATH REMOVAL N/A 08/12/2022   Procedure: MINOR REMOVAL PORT-A-CATH;  Surgeon: Franky Macho, MD;  Location: [facility];  Service: General;  Laterality: N/A;   PORTACATH PLACEMENT N/A 03/07/2020   Procedure: INSERTION PORT-A-CATH;  Surgeon: Almond Lint, MD;  Location: [facility] OR;  Service: General;  Laterality: Left    Social History: Social History   Socioeconomic History  Marital status: Legally Separated    Spouse name: Not on file   Number of children: 2   Years of education: Not on file   Highest education level: Not on file  Occupational History   Occupation: unemployed d/t COVID  Tobacco Use   Smoking status: Every Day    Current packs/day: 1.00    Average packs/day: 1 pack/day for 29.0 years (29.0 ttl pk-yrs)    Types: Cigarettes   Smokeless tobacco: Never  Vaping Use   Vaping status: Never Used  Substance and Sexual Activity   Alcohol use: No   Drug use: Yes    Types: Marijuana   Sexual activity: Yes  Other Topics Concern   Not on file  Social History Narrative   Not on file   Social Drivers of Health   Financial Resource Strain: High Risk (12/08/2019)   Overall Financial Resource Strain (CARDIA)    Difficulty of Paying Living Expenses: Hard  Food Insecurity: No Food Insecurity (12/08/2019)   Hunger Vital Sign    Worried About Running Out of Food in the Last Year: Never true    Ran Out of Food in the Last Year: Never true  Transportation Needs: No Transportation Needs (12/26/2020)   PRAPARE - Administrator, Civil Service (Medical): No    Lack of Transportation (Non-Medical): No  Physical Activity: Inactive (12/26/2020)   Exercise Vital Sign    Days of Exercise per Week: 0 days    Minutes of Exercise per  Session: 0 min  Stress: Stress Concern Present (12/08/2019)   [employer] of Occupational Health - Occupational Stress Questionnaire    Feeling of Stress : Very much  Social Connections: Unknown (06/26/2023)   Received from [facility]   Social Network    Social Network: Not on file  Intimate Partner Violence: Not At Risk (06/26/2023)   Received from [facility]   HITS    Over the last 12 months how often did your partner physically hurt you?: Never    Over the last 12 months how often did your partner insult you or talk down to you?: Never    Over the last 12 months how often did your partner threaten you with physical harm?: Never    Over the last 12 months how often did your partner scream or curse at you?: Never    Family History: Family History  Problem Relation Age of Onset   Heart disease Mother    Cancer Mother    Ovarian cysts Sister    Healthy Brother    Cancer Maternal Grandmother    Heart attack Maternal Grandmother    Cancer Maternal Grandfather    Cancer Paternal Grandmother    Healthy Sister    Healthy Sister    Psoriasis Daughter    Healthy Son     Current Medications:  Current Outpatient Medications:    gabapentin (NEURONTIN) 100 MG capsule, Take by mouth., Disp: , Rfl:    DULoxetine (CYMBALTA) 30 MG capsule, TAKE 1 CAPSULE BY MOUTH EVERY DAY (Patient not taking: Reported on 11/22/2023), Disp: 90 capsule, Rfl: 1   levothyroxine (SYNTHROID) 25 MCG tablet, Take 1 tablet (25 mcg total) by mouth daily before breakfast., Disp: 30 tablet, Rfl: 6   mirabegron ER (MYRBETRIQ) 25 MG TB24 tablet, Take 1 tablet (25 mg total) by mouth daily., Disp: 30 tablet, Rfl: 11   pantoprazole (PROTONIX) 40 MG tablet, Take 1 tablet (40 mg total) by mouth daily., Disp: 30 tablet, Rfl: 3  solifenacin (VESICARE) 5 MG tablet, Take 1 tablet (5 mg total) by mouth daily., Disp: 30 tablet, Rfl: 11   sulfamethoxazole-trimethoprim (BACTRIM DS) 800-160 MG tablet, Take 1 tablet by mouth  2 (two) times daily., Disp: 14 tablet, Rfl: 0   Vibegron (GEMTESA) 75 MG TABS, Take 1 tablet (75 mg total) by mouth daily., Disp: , Rfl:    Vitamin D, Ergocalciferol, (DRISDOL) 1.25 MG (50000 UNIT) CAPS capsule, Take 1 capsule (50,000 Units total) by mouth every 7 (seven) days., Disp: 12 capsule, Rfl: 1   Allergies: Allergies  Allergen Reactions   Gadavist [Gadobutrol] Nausea Only    Mild nausea but no vomiting after injection.  Ok after 5 minutes.    REVIEW OF SYSTEMS:   Review of Systems  Constitutional:  Negative for chills, fatigue and fever.  HENT:   Negative for lump/mass, mouth sores, nosebleeds, sore throat and trouble swallowing.   Eyes:  Negative for eye problems.  Respiratory:  Positive for cough and shortness of breath.   Cardiovascular:  Negative for chest pain, leg swelling and palpitations.  Gastrointestinal:  Positive for nausea and vomiting. Negative for abdominal pain, constipation and diarrhea.  Genitourinary:  Negative for bladder incontinence, difficulty urinating, dysuria, frequency, hematuria and nocturia.   Musculoskeletal:  Negative for arthralgias, back pain, flank pain, myalgias and neck pain.  Skin:  Negative for itching and rash.  Neurological:  Positive for dizziness and headaches. Negative for numbness.  Hematological:  Does not bruise/bleed easily.  Psychiatric/Behavioral:  Positive for depression and sleep disturbance. Negative for suicidal ideas. The patient is nervous/anxious.   All other systems reviewed and are negative.    VITALS:   Blood pressure 94/60, pulse 71, temperature 97.8 F (36.6 C), temperature source Tympanic, resp. rate 18, height 5\' 2"  (1.575 m), weight 114 lb (51.7 kg), SpO2 99%.  Wt Readings from Last 3 Encounters:  11/29/23 114 lb (51.7 kg)  11/22/23 110 lb (49.9 kg)  09/13/23 114 lb 9.6 oz (52 kg)    Body mass index is 20.85 kg/m.  Performance status (ECOG): 1 - Symptomatic but completely ambulatory  PHYSICAL EXAM:    Physical Exam Vitals and nursing note reviewed. Exam conducted with a chaperone present.  Constitutional:      Appearance: Normal appearance.  Cardiovascular:     Rate and Rhythm: Normal rate and regular rhythm.     Pulses: Normal pulses.     Heart sounds: Normal heart sounds.  Pulmonary:     Effort: Pulmonary effort is normal.     Breath sounds: Normal breath sounds.  Abdominal:     Palpations: Abdomen is soft. There is no hepatomegaly, splenomegaly or mass.     Tenderness: There is no abdominal tenderness.  Musculoskeletal:     Right lower leg: No edema.     Left lower leg: No edema.  Lymphadenopathy:     Cervical: No cervical adenopathy.     Right cervical: No superficial, deep or posterior cervical adenopathy.    Left cervical: No superficial, deep or posterior cervical adenopathy.     Upper Body:     Right upper body: No supraclavicular or axillary adenopathy.     Left upper body: No supraclavicular or axillary adenopathy.  Neurological:     General: No focal deficit present.     Mental Status: She is alert and oriented to person, place, and time.  Psychiatric:        Mood and Affect: Mood normal.        Behavior:  Behavior normal.     LABS:   CBC     Component Value Date/Time   WBC 5.1 11/22/2023 0954   RBC 4.76 11/22/2023 0954   HGB 15.2 (H) 11/22/2023 0954   HGB 12.5 08/29/2007 1545   HCT 45.2 11/22/2023 0954   HCT 37.0 08/29/2007 1545   PLT 264 11/22/2023 0954   PLT 277 08/29/2007 1545   MCV 95.0 11/22/2023 0954   MCV 84.5 08/29/2007 1545   MCH 31.9 11/22/2023 0954   MCHC 33.6 11/22/2023 0954   RDW 12.4 11/22/2023 0954   RDW 16.2 (H) 08/29/2007 1545   LYMPHSABS 1.4 11/22/2023 0954   LYMPHSABS 2.3 08/29/2007 1545   MONOABS 0.3 11/22/2023 0954   MONOABS 0.3 08/29/2007 1545   EOSABS 0.1 11/22/2023 0954   EOSABS 0.2 08/29/2007 1545   BASOSABS 0.0 11/22/2023 0954   BASOSABS 0.0 08/29/2007 1545    CMP      Component Value Date/Time   NA 133 (L)  11/22/2023 0954   NA 140 10/18/2023 1430   K 3.9 11/22/2023 0954   CL 104 11/22/2023 0954   CO2 22 11/22/2023 0954   GLUCOSE 129 (H) 11/22/2023 0954   BUN 15 11/22/2023 0954   BUN 6 10/18/2023 1430   CREATININE 0.74 11/22/2023 0954   CALCIUM 10.1 11/22/2023 0954   PROT 7.8 11/22/2023 0954   ALBUMIN 4.5 11/22/2023 0954   AST 19 11/22/2023 0954   ALT 15 11/22/2023 0954   ALKPHOS 61 11/22/2023 0954   BILITOT 0.5 11/22/2023 0954   GFRNONAA >60 11/22/2023 0954   GFRAA >60 07/30/2020 1231     No results found for: "CEA1", "CEA" / No results found for: "CEA1", "CEA" No results found for: "PSA1" No results found for: "ZOX096" No results found for: "CAN125"  No results found for: "TOTALPROTELP", "ALBUMINELP", "A1GS", "A2GS", "BETS", "BETA2SER", "GAMS", "MSPIKE", "SPEI" Lab Results  Component Value Date   TIBC 426 11/22/2023   TIBC 345 05/18/2023   TIBC 420 10/22/2022   FERRITIN 31 11/22/2023   FERRITIN 63 05/18/2023   FERRITIN 5 (L) 10/22/2022   IRONPCTSAT 9 (L) 11/22/2023   IRONPCTSAT 20 05/18/2023   IRONPCTSAT 6 (L) 10/22/2022   Lab Results  Component Value Date   LDH 96 (L) 11/22/2023   LDH 94 (L) 05/18/2023   LDH 105 10/22/2022     STUDIES:   No results found.

## 2023-11-29 ENCOUNTER — Inpatient Hospital Stay: Payer: Medicaid Other | Admitting: Hematology

## 2023-11-29 VITALS — BP 94/60 | HR 71 | Temp 97.8°F | Resp 18 | Ht 62.0 in | Wt 114.0 lb

## 2023-11-29 DIAGNOSIS — C4371 Malignant melanoma of right lower limb, including hip: Secondary | ICD-10-CM

## 2023-11-29 DIAGNOSIS — E032 Hypothyroidism due to medicaments and other exogenous substances: Secondary | ICD-10-CM

## 2023-11-29 DIAGNOSIS — F17210 Nicotine dependence, cigarettes, uncomplicated: Secondary | ICD-10-CM | POA: Diagnosis not present

## 2023-11-29 DIAGNOSIS — D509 Iron deficiency anemia, unspecified: Secondary | ICD-10-CM

## 2023-11-29 DIAGNOSIS — D649 Anemia, unspecified: Secondary | ICD-10-CM | POA: Diagnosis not present

## 2023-11-29 DIAGNOSIS — Z85820 Personal history of malignant melanoma of skin: Secondary | ICD-10-CM | POA: Diagnosis not present

## 2023-11-29 DIAGNOSIS — Z79899 Other long term (current) drug therapy: Secondary | ICD-10-CM | POA: Diagnosis not present

## 2023-11-29 DIAGNOSIS — E039 Hypothyroidism, unspecified: Secondary | ICD-10-CM | POA: Diagnosis not present

## 2023-11-29 NOTE — Patient Instructions (Signed)
[facility] at [facility] Discharge Instructions   You were seen and examined today by Dr. Ellin Saba.  He reviewed the results of your lab work which are normal/stable.   We will see you back in 6 months. We will repeat lab work and a scan prior to this visit.   Return as scheduled.    Thank you for choosing [facility] at [facility] to provide your oncology and hematology care.  To afford each patient quality time with our provider, please arrive at least 15 minutes before your scheduled appointment time.   If you have a lab appointment with the Cancer Center please come in thru the Main Entrance and check in at the main information desk.  You need to re-schedule your appointment should you arrive 10 or more minutes late.  We strive to give you quality time with our providers, and arriving late affects you and other patients whose appointments are after yours.  Also, if you no show three or more times for appointments you may be dismissed from the clinic at the providers discretion.     Again, thank you for choosing [facility].  Our hope is that these requests will decrease the amount of time that you wait before being seen by our physicians.       _____________________________________________________________  Should you have questions after your visit to [facility], please contact our office at [phone] and follow the prompts.  Our office hours are 8:00 a.m. and 4:30 p.m. Monday - Friday.  Please note that voicemails left after 4:00 p.m. may not be returned until the following business day.  We are closed weekends and major holidays.  You do have access to a nurse 24-7, just call the main number to the clinic [phone] and do not press any options, hold on the line and a nurse will answer the phone.    For prescription refill requests, have your pharmacy contact our office and allow 72 hours.    Due to  Covid, you will need to wear a mask upon entering the hospital. If you do not have a mask, a mask will be given to you at the Main Entrance upon arrival. For doctor visits, patients may have 1 support person age 82 or older with them. For treatment visits, patients can not have anyone with them due to social distancing guidelines and our immunocompromised population.

## 2023-12-07 ENCOUNTER — Ambulatory Visit (HOSPITAL_COMMUNITY)
Admission: RE | Admit: 2023-12-07 | Discharge: 2023-12-07 | Disposition: A | Discharge status: Home / Self Care | Payer: Medicaid Other | Source: Ambulatory Visit | Attending: Urology | Admitting: Urology

## 2023-12-07 ENCOUNTER — Encounter (HOSPITAL_COMMUNITY): Payer: Self-pay | Admitting: Radiology

## 2023-12-07 DIAGNOSIS — N200 Calculus of kidney: Secondary | ICD-10-CM | POA: Diagnosis not present

## 2023-12-07 DIAGNOSIS — R3129 Other microscopic hematuria: Secondary | ICD-10-CM | POA: Insufficient documentation

## 2023-12-07 DIAGNOSIS — N281 Cyst of kidney, acquired: Secondary | ICD-10-CM | POA: Diagnosis not present

## 2023-12-07 MED ORDER — IOHEXOL 300 MG/ML  SOLN
125.0000 mL | Freq: Once | INTRAMUSCULAR | Status: AC | PRN
  Administered 2023-12-07: 125 mL via INTRAVENOUS

## 2023-12-11 ENCOUNTER — Other Ambulatory Visit: Payer: Self-pay | Admitting: Internal Medicine

## 2023-12-11 DIAGNOSIS — K2100 Gastro-esophageal reflux disease with esophagitis, without bleeding: Secondary | ICD-10-CM

## 2023-12-15 ENCOUNTER — Ambulatory Visit: Payer: Medicaid Other | Admitting: Urology

## 2023-12-15 VITALS — BP 107/73 | HR 86

## 2023-12-15 DIAGNOSIS — R3129 Other microscopic hematuria: Secondary | ICD-10-CM | POA: Diagnosis not present

## 2023-12-15 MED ORDER — CIPROFLOXACIN HCL 500 MG PO TABS
500.0000 mg | ORAL_TABLET | Freq: Once | ORAL | Status: AC
  Administered 2023-12-15: 500 mg via ORAL

## 2023-12-15 NOTE — Progress Notes (Signed)
   12/15/23  CC: gross hematuria   HPI: Donna Munoz is a 47yo here for cystoscopy for gross hematuria. CT shows bilateral nephrolithiasis Blood pressure 107/73, pulse 86. NED. A&Ox3.   No respiratory distress   Abd soft, NT, ND Normal external genitalia with patent urethral meatus  Cystoscopy Procedure Note  Patient identification was confirmed, informed consent was obtained, and patient was prepped using Betadine solution.  Lidocaine  jelly was administered per urethral meatus.    Procedure: - Flexible cystoscope introduced, without any difficulty.   - Thorough search of the bladder revealed:    normal urethral meatus    normal urothelium    no stones    no ulcers     no tumors    no urethral polyps    no trabeculation  - Ureteral orifices were normal in position and appearance.  Post-Procedure: - Patient tolerated the procedure well  Assessment/ Plan: Followup 6 months with [facility]   No follow-ups on file.  Belvie Clara, MD

## 2023-12-16 LAB — URINALYSIS, ROUTINE W REFLEX MICROSCOPIC
Bilirubin, UA: NEGATIVE
Glucose, UA: NEGATIVE
Ketones, UA: NEGATIVE
Leukocytes,UA: NEGATIVE
Nitrite, UA: NEGATIVE
Protein,UA: NEGATIVE
RBC, UA: NEGATIVE
Specific Gravity, UA: 1.01 (ref 1.005–1.030)
Urobilinogen, Ur: 0.2 mg/dL (ref 0.2–1.0)
pH, UA: 6.5 (ref 5.0–7.5)

## 2023-12-20 ENCOUNTER — Encounter: Payer: Self-pay | Admitting: Urology

## 2023-12-20 NOTE — Patient Instructions (Signed)

## 2023-12-21 ENCOUNTER — Ambulatory Visit: Payer: Medicaid Other | Admitting: Gastroenterology

## 2023-12-21 ENCOUNTER — Telehealth: Payer: Self-pay

## 2023-12-21 ENCOUNTER — Encounter: Payer: Self-pay | Admitting: Gastroenterology

## 2023-12-21 VITALS — BP 102/72 | HR 75 | Temp 98.2°F | Resp 22 | Ht 62.0 in | Wt 110.0 lb

## 2023-12-21 DIAGNOSIS — B9681 Helicobacter pylori [H. pylori] as the cause of diseases classified elsewhere: Secondary | ICD-10-CM

## 2023-12-21 DIAGNOSIS — K5710 Diverticulosis of small intestine without perforation or abscess without bleeding: Secondary | ICD-10-CM

## 2023-12-21 DIAGNOSIS — K317 Polyp of stomach and duodenum: Secondary | ICD-10-CM

## 2023-12-21 DIAGNOSIS — D3A092 Benign carcinoid tumor of the stomach: Secondary | ICD-10-CM | POA: Diagnosis not present

## 2023-12-21 DIAGNOSIS — K2950 Unspecified chronic gastritis without bleeding: Secondary | ICD-10-CM

## 2023-12-21 DIAGNOSIS — K219 Gastro-esophageal reflux disease without esophagitis: Secondary | ICD-10-CM

## 2023-12-21 DIAGNOSIS — K2289 Other specified disease of esophagus: Secondary | ICD-10-CM

## 2023-12-21 DIAGNOSIS — Q396 Congenital diverticulum of esophagus: Secondary | ICD-10-CM | POA: Diagnosis not present

## 2023-12-21 DIAGNOSIS — K635 Polyp of colon: Secondary | ICD-10-CM | POA: Diagnosis not present

## 2023-12-21 DIAGNOSIS — Z1211 Encounter for screening for malignant neoplasm of colon: Secondary | ICD-10-CM | POA: Diagnosis not present

## 2023-12-21 DIAGNOSIS — D124 Benign neoplasm of descending colon: Secondary | ICD-10-CM

## 2023-12-21 DIAGNOSIS — D3A8 Other benign neuroendocrine tumors: Secondary | ICD-10-CM | POA: Diagnosis not present

## 2023-12-21 DIAGNOSIS — K319 Disease of stomach and duodenum, unspecified: Secondary | ICD-10-CM | POA: Diagnosis not present

## 2023-12-21 MED ORDER — SODIUM CHLORIDE 0.9 % IV SOLN: 500.0000 mL | Freq: Once | INTRAVENOUS | Status: DC

## 2023-12-21 NOTE — Patient Instructions (Addendum)
Await pathology results on gastric biopsies, and colon polyps   Handout on Colon polyps given to you today  CT scan recommended to further evaluate suspected esophageal diverticulum ( can wait until upcoming surveillance CT )  Continue previous diet & medications     YOU HAD AN ENDOSCOPIC PROCEDURE TODAY AT THE [facility]:   Refer to the procedure report that was given to you for any specific questions about what was found during the examination.  If the procedure report does not answer your questions, please call your gastroenterologist to clarify.  If you requested that your care partner not be given the details of your procedure findings, then the procedure report has been included in a sealed envelope for you to review at your convenience later.  YOU SHOULD EXPECT: Some feelings of bloating in the abdomen. Passage of more gas than usual.  Walking can help get rid of the air that was put into your GI tract during the procedure and reduce the bloating. If you had a lower endoscopy (such as a colonoscopy or flexible sigmoidoscopy) you may notice spotting of blood in your stool or on the toilet paper. If you underwent a bowel prep for your procedure, you may not have a normal bowel movement for a few days.  Please Note:  You might notice some irritation and congestion in your nose or some drainage.  This is from the oxygen used during your procedure.  There is no need for concern and it should clear up in a day or so.  SYMPTOMS TO REPORT IMMEDIATELY:  Following lower endoscopy (colonoscopy or flexible sigmoidoscopy):  Excessive amounts of blood in the stool  Significant tenderness or worsening of abdominal pains  Swelling of the abdomen that is new, acute  Fever of 100F or higher  Following upper endoscopy (EGD)  Vomiting of blood or coffee ground material  New chest pain or pain under the shoulder blades  Painful or persistently difficult swallowing  New shortness  of breath  Fever of 100F or higher  Black, tarry-looking stools  For urgent or emergent issues, a gastroenterologist can be reached at any hour by calling [phone]. Do not use MyChart messaging for urgent concerns.    DIET:  We do recommend a small meal at first, but then you may proceed to your regular diet.  Drink plenty of fluids but you should avoid alcoholic beverages for 24 hours.  ACTIVITY:  You should plan to take it easy for the rest of today and you should NOT DRIVE or use heavy machinery until tomorrow (because of the sedation medicines used during the test).    FOLLOW UP: Our staff will call the number listed on your records the next business day following your procedure.  We will call around 7:15- 8:00 am to check on you and address any questions or concerns that you may have regarding the information given to you following your procedure. If we do not reach you, we will leave a message.     If any biopsies were taken you will be contacted by phone or by letter within the next 1-3 weeks.  Please call us at [phone] if you have not heard about the biopsies in 3 weeks.    SIGNATURES/CONFIDENTIALITY: You and/or your care partner have signed paperwork which will be entered into your electronic medical record.  These signatures attest to the fact that that the information above on your After Visit Summary has been reviewed and  is understood.  Full responsibility of the confidentiality of this discharge information lies with you and/or your care-partner.

## 2023-12-21 NOTE — Op Note (Signed)
[facility] Patient Name: Donna Munoz Procedure Date: 12/21/2023 2:23 PM MRN: [id number] Endoscopist: Lorin Picket E. Tomasa Rand , MD, [id number] Age: 48 Referring MD:  Date of Birth: 01/16/1976 Gender: Female Account #: [id number] Procedure:                Upper GI endoscopy Indications:              Dysphagia, Nausea with vomiting, Weight loss Medicines:                Monitored Anesthesia Care Procedure:                Pre-Anesthesia Assessment:                           - Prior to the procedure, a History and Physical                            was performed, and patient medications and                            allergies were reviewed. The patient's tolerance of                            previous anesthesia was also reviewed. The risks                            and benefits of the procedure and the sedation                            options and risks were discussed with the patient.                            All questions were answered, and informed consent                            was obtained. Prior Anticoagulants: The patient has                            taken no anticoagulant or antiplatelet agents. ASA                            Grade Assessment: II - A patient with mild systemic                            disease. After reviewing the risks and benefits,                            the patient was deemed in satisfactory condition to                            undergo the procedure.                           After obtaining informed consent, the endoscope was  passed under direct vision. Throughout the                            procedure, the patient's blood pressure, pulse, and                            oxygen saturations were monitored continuously. The                            GIF [id number] #[id number] was introduced through the                            mouth, and advanced to the second part of duodenum.                            The  upper GI endoscopy was accomplished without                            difficulty. The patient tolerated the procedure                            well. Scope In: Scope Out: Findings:                 The examined portions of the nasopharynx,                            oropharynx and larynx were normal.                           A suspected diverticulum with a small opening was                            found in the lower third of the esophagus (34 cm                            from incisors).                           The exam of the esophagus was otherwise normal.                           Diffuse mucosal changes characterized by atrophy,                            friability (with contact bleeding) and nodularity                            were found in the gastric body. Biopsies were taken                            with a cold forceps for histology. Estimated blood                            loss was minimal.  A single 5 mm sessile polypoid lesion was found in                            the gastric body. Biopsies were taken with a cold                            forceps for histology. Estimated blood loss was                            minimal.                           The exam of the stomach was otherwise normal.                           Biopsies were taken with a cold forceps in the                            gastric antrum for Helicobacter pylori testing.                            Estimated blood loss was minimal.                           The gastroesophageal flap valve was visualized                            endoscopically and classified as Hill Grade IV (no                            fold, wide open lumen, hiatal hernia present).                           A medium diverticulum was found in the area of the                            papilla.                           The exam of the duodenum was otherwise normal. Complications:            No  immediate complications. Estimated Blood Loss:     Estimated blood loss was minimal. Impression:               - The examined portions of the nasopharynx,                            oropharynx and larynx were normal.                           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                           -  Atrophic, friable (with contact bleeding) and                            nodular mucosa in the gastric body. Biopsied.                           - A single gastric polyp. Biopsied.                           - Gastroesophageal flap valve classified as Hill                            Grade IV (no fold, wide open lumen, hiatal hernia                            present).                           - Duodenal diverticulum. Recommendation:           - Patient has a contact number available for                            emergencies. The signs and symptoms of potential                            delayed complications were discussed with the                            patient. Return to normal activities tomorrow.                            Written discharge instructions were provided to the                            patient.                           - Resume previous diet.                           - Continue present medications.                           - Await pathology results.                           - Recommend CT chest to further evaluate suspected                            esophageal diverticulum (can wait until upcoming                            surveillance CT)                           - Further recommendations will be based on  pathology results. Hanifa Antonetti E. Tomasa Rand, MD 12/21/2023 3:25:46 PM This report has been signed electronically.

## 2023-12-21 NOTE — Progress Notes (Signed)
Pt's states no medical or surgical changes since previsit or office visit.

## 2023-12-21 NOTE — Progress Notes (Signed)
History and Physical Interval Note:  12/21/2023 2:26 PM  Donna Munoz  has presented today for endoscopic procedure(s), with the diagnosis of  Encounter Diagnoses  Name Primary?   Gastroesophageal reflux disease, unspecified whether esophagitis present Yes   Colon cancer screening   .  The various methods of evaluation and treatment have been discussed with the patient and/or family. After consideration of risks, benefits and other options for treatment, the patient has consented to  the endoscopic procedure(s).   The patient's history has been reviewed, patient examined, no change in status, stable for endoscopic procedure(s).  I have reviewed the patient's chart and labs.  Questions were answered to the patient's satisfaction.     Elchonon Maxson E. Tomasa Rand, MD [facility]

## 2023-12-21 NOTE — Op Note (Signed)
[facility] Patient Name: Donna Munoz Procedure Date: 12/21/2023 2:19 PM MRN: [id number] Endoscopist: Lorin Picket E. Tomasa Rand , MD, [id number] Age: 48 Referring MD:  Date of Birth: 21-Sep-1976 Gender: Female Account #: [id number] Procedure:                Colonoscopy Indications:              Screening for colorectal malignant neoplasm Medicines:                Monitored Anesthesia Care Procedure:                Pre-Anesthesia Assessment:                           - Prior to the procedure, a History and Physical                            was performed, and patient medications and                            allergies were reviewed. The patient's tolerance of                            previous anesthesia was also reviewed. The risks                            and benefits of the procedure and the sedation                            options and risks were discussed with the patient.                            All questions were answered, and informed consent                            was obtained. Prior Anticoagulants: The patient has                            taken no anticoagulant or antiplatelet agents. ASA                            Grade Assessment: II - A patient with mild systemic                            disease. After reviewing the risks and benefits,                            the patient was deemed in satisfactory condition to                            undergo the procedure.                           After obtaining informed consent, the colonoscope  was passed under direct vision. Throughout the                            procedure, the patient's blood pressure, pulse, and                            oxygen saturations were monitored continuously. The                            Olympus Scope [id number] was introduced through the                            anus and advanced to the the terminal ileum, with                             identification of the appendiceal orifice and IC                            valve. The colonoscopy was performed without                            difficulty. The patient tolerated the procedure                            well. The quality of the bowel preparation was                            good. The terminal ileum, ileocecal valve,                            appendiceal orifice, and rectum were photographed.                            The bowel preparation used was Plenvu via split                            dose instruction. Scope In: 2:55:54 PM Scope Out: 3:11:22 PM Scope Withdrawal Time: 0 hours 11 minutes 30 seconds  Total Procedure Duration: 0 hours 15 minutes 28 seconds  Findings:                 The perianal and digital rectal examinations were                            normal. Pertinent negatives include normal                            sphincter tone and no palpable rectal lesions.                           Two sessile polyps were found in the descending                            colon. The polyps were 3 to 6 mm in size. These  polyps were removed with a cold snare. Resection                            and retrieval were complete. Estimated blood loss                            was minimal.                           The exam was otherwise normal throughout the                            examined colon.                           The terminal ileum appeared normal.                           The retroflexed view of the distal rectum and anal                            verge was normal and showed no anal or rectal                            abnormalities. Complications:            No immediate complications. Estimated Blood Loss:     Estimated blood loss was minimal. Impression:               - Two 3 to 6 mm polyps in the descending colon,                            removed with a cold snare. Resected and retrieved.                           - The  examined portion of the ileum was normal.                           - The distal rectum and anal verge are normal on                            retroflexion view. Recommendation:           - Patient has a contact number available for                            emergencies. The signs and symptoms of potential                            delayed complications were discussed with the                            patient. Return to normal activities tomorrow.                            Written discharge  instructions were provided to the                            patient.                           - Resume previous diet.                           - Repeat colonoscopy (date not yet determined) for                            surveillance. Amirra Herling E. Tomasa Rand, MD 12/21/2023 3:29:26 PM This report has been signed electronically.

## 2023-12-21 NOTE — Progress Notes (Signed)
Report to PACU, RN, vss, BBS= Clear.

## 2023-12-21 NOTE — Progress Notes (Signed)
Called to room to assist during endoscopic procedure.  Patient ID and intended procedure confirmed with present staff. Received instructions for my participation in the procedure from the performing physician.

## 2023-12-21 NOTE — Telephone Encounter (Addendum)
Patient phoned in with c/o vomiting after drinking part of the prep. She reports yellow result. Encouraged patient to drink as much of the prep and clear liquids as she is able to . Instructed patient to stop drinking all fluids at 12 pm and call with questions or problems. She verbalized understanding and agreed to POC.

## 2023-12-22 ENCOUNTER — Telehealth: Payer: Self-pay

## 2023-12-22 MED ORDER — OMEPRAZOLE 40 MG PO CPDR: 40.0000 mg | DELAYED_RELEASE_CAPSULE | Freq: Every day | ORAL | 1 refills | Status: DC

## 2023-12-22 NOTE — Telephone Encounter (Signed)
Called and patient is aware.  Medication sent to preferred pharmacy and protonix has been discontinued.

## 2023-12-22 NOTE — Telephone Encounter (Signed)
Inbound call from patient stating she is doing well. Patient states that Dr. Tomasa Rand was going to call in a different medication other than Protonix since it is not working and does not remember the name of the medication. Requesting Dr. Tomasa Rand send in medication. Please advise.

## 2023-12-22 NOTE — Telephone Encounter (Signed)
  Follow up Call-     12/21/2023    2:09 PM  Call back number  Post procedure Call Back phone  # [phone]  Permission to leave phone message Yes    Post op call attempted, no answer, full VMB.

## 2023-12-22 NOTE — Addendum Note (Signed)
Addended by: Georgina Peer on: 12/22/2023 01:56 PM   Modules accepted: Orders

## 2023-12-27 LAB — SURGICAL PATHOLOGY

## 2023-12-30 ENCOUNTER — Encounter: Payer: Self-pay | Admitting: Gastroenterology

## 2023-12-30 ENCOUNTER — Other Ambulatory Visit: Payer: Self-pay

## 2023-12-30 MED ORDER — BISMUTH 262 MG PO CHEW: 524.0000 mg | CHEWABLE_TABLET | Freq: Four times a day (QID) | ORAL | 0 refills | Status: DC

## 2023-12-30 MED ORDER — OMEPRAZOLE 20 MG PO CPDR: 20.0000 mg | DELAYED_RELEASE_CAPSULE | Freq: Two times a day (BID) | ORAL | 0 refills | Status: DC

## 2023-12-30 MED ORDER — METRONIDAZOLE 250 MG PO TABS: 250.0000 mg | ORAL_TABLET | Freq: Four times a day (QID) | ORAL | 0 refills | Status: DC

## 2023-12-30 MED ORDER — DOXYCYCLINE HYCLATE 100 MG PO CAPS: 100.0000 mg | ORAL_CAPSULE | Freq: Two times a day (BID) | ORAL | 0 refills | Status: DC

## 2023-12-30 NOTE — Progress Notes (Signed)
Ms. Scruton,  The biopsies from the recent upper GI Endoscopy were notable for H. Pylori gastritis.  This is a common bacteria that can live in the stomach.  It can cause chronic symptoms of abdominal pain, nausea or vomiting.  It also increases the risk for ulcers, gastric cancer and iron deficiency anemia. Will plan on treating with quad therapy as below.   1) Omeprazole 20 mg 2 times a day x 14 d 2) Pepto Bismol 2 tabs (262 mg each) 4 times a day x 14 d 3) Metronidazole 250 mg 4 times a day x 14 d 4) doxycycline 100 mg 2 times a day x 14 d  After 14 days, ok to stop omeprazole.  4 weeks after treatment completed, check H. 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  This type of neuroendocrine tumor in the stomach is often associated with chronic gastritis.  It is a very indolent tumor, meaning that it is not aggressive, grows very slowly, and essentially never metastasizes (spreads).  However, you will potentially be at risk for growing more of them, so it is recommended that you undergo regular upper endoscopies. I recommend you repeat upper endoscopy in 6 months and look for residual polyp of the one that was biopsied.  After that, it will likely be yearly endoscopies.  Once the H. pylori is eradicated, I would like to do some further testing to evaluate for autoimmune gastritis.  Bonita Quin, Can you please order the medications and stool test above, and place reminder for repeat EGD in 6 months?

## 2023-12-30 NOTE — Progress Notes (Signed)
Ms. Lope,  Good news: the polyp (or polyps) that I removed during your recent examination were NOT precancerous.  You should continue to follow current colorectal cancer screening guidelines with a repeat colonoscopy in 10 years.    If you develop any new rectal bleeding, abdominal pain or significant bowel habit changes, please contact me before then.

## 2024-01-04 ENCOUNTER — Telehealth: Payer: Self-pay | Admitting: Gastroenterology

## 2024-01-04 NOTE — Telephone Encounter (Signed)
 Please advise patient to hold meds for now, use Bendaryl as needed and monitor the rash. I will let Dr Milana Kidney decide on alternate therapy for H.pylori treatment once he is back.

## 2024-01-04 NOTE — Telephone Encounter (Signed)
 Inbound call from patient stating that Dr.Cunningham has her taking Vibramycin, Flagyl, Prilosec and Pepto Bismol. Patient states starting yesterday she has broke out in a rash and believes its coming from one of the medications. Patient states she needs to take her medications because her stomach is hurting but is scared to take them. Patient is requesting a call back to discuss. Please advise.

## 2024-01-04 NOTE — Telephone Encounter (Signed)
 Donna Munoz Pt has h pylori. Took 2 days of doxycycline, flagyl, omeprazole, and bismuth and states she has broken out in a red itchy rash on her torso/stomach/breasts and back. She has not taken the meds today, she is taking benadryl. She states her stomach did feel better taking the meds so she wants to continue some sort of tx. As dod please advise.

## 2024-01-05 NOTE — Telephone Encounter (Signed)
 Pt aware of recommendations per Dr Lavon Paganini. She states the itching is worse, now the rash is at the top of her buttocks. She also states her breasts have whelps under them and are as red as solo cups. States she plans to go to the [facility] to be evaluated after work. Pt aware we will call her with an alternate plan of care once Dr Tomasa Rand returns to the office.

## 2024-01-10 ENCOUNTER — Encounter: Payer: Self-pay | Admitting: Gastroenterology

## 2024-01-11 ENCOUNTER — Telehealth (INDEPENDENT_AMBULATORY_CARE_PROVIDER_SITE_OTHER): Payer: Medicaid Other | Admitting: Internal Medicine

## 2024-01-11 ENCOUNTER — Encounter: Payer: Self-pay | Admitting: Internal Medicine

## 2024-01-11 DIAGNOSIS — E538 Deficiency of other specified B group vitamins: Secondary | ICD-10-CM | POA: Diagnosis not present

## 2024-01-11 DIAGNOSIS — E559 Vitamin D deficiency, unspecified: Secondary | ICD-10-CM

## 2024-01-11 DIAGNOSIS — B9681 Helicobacter pylori [H. pylori] as the cause of diseases classified elsewhere: Secondary | ICD-10-CM | POA: Diagnosis not present

## 2024-01-11 DIAGNOSIS — G629 Polyneuropathy, unspecified: Secondary | ICD-10-CM

## 2024-01-11 DIAGNOSIS — G44021 Chronic cluster headache, intractable: Secondary | ICD-10-CM | POA: Diagnosis not present

## 2024-01-11 DIAGNOSIS — K2970 Gastritis, unspecified, without bleeding: Secondary | ICD-10-CM | POA: Insufficient documentation

## 2024-01-11 DIAGNOSIS — C4371 Malignant melanoma of right lower limb, including hip: Secondary | ICD-10-CM | POA: Diagnosis not present

## 2024-01-11 DIAGNOSIS — C439 Malignant melanoma of skin, unspecified: Secondary | ICD-10-CM

## 2024-01-11 DIAGNOSIS — K2100 Gastro-esophageal reflux disease with esophagitis, without bleeding: Secondary | ICD-10-CM | POA: Diagnosis not present

## 2024-01-11 DIAGNOSIS — F331 Major depressive disorder, recurrent, moderate: Secondary | ICD-10-CM

## 2024-01-11 MED ORDER — LEVOFLOXACIN 500 MG PO TABS
500.0000 mg | ORAL_TABLET | Freq: Every day | ORAL | 0 refills | Status: AC
Start: 2024-01-11 — End: 2024-01-25

## 2024-01-11 MED ORDER — VENLAFAXINE HCL ER 75 MG PO CP24: 75.0000 mg | ORAL_CAPSULE | Freq: Every day | ORAL | 3 refills | Status: DC

## 2024-01-11 MED ORDER — AMOXICILLIN 500 MG PO TABS: 1000.0000 mg | ORAL_TABLET | Freq: Two times a day (BID) | ORAL | 0 refills | Status: AC

## 2024-01-11 NOTE — Assessment & Plan Note (Signed)
 Left-sided headache, mostly periorbital with watering of eyes Could be cluster headache - referred to neurology Started Effexor for MDD, could help with headache as well

## 2024-01-11 NOTE — Assessment & Plan Note (Signed)
 Her epigastric pain and nausea is likely due to GERD with esophagitis Had EGD -showed H Pylori, needs to complete treatment Continue omeprazole 40 mg BID Follow up with GI Avoid hot and spicy food Needs to cut down soft drink intake and quit smoking

## 2024-01-11 NOTE — Assessment & Plan Note (Signed)
 Chronic numbness of the RLE > LLE and LUE Likely due to polyneuropathy DPA pulse intact Had prescribed Cymbalta 30 mg QD for neuropathy and MDD - but c/o nausea, unclear if related to H Pylori, but she preferred to change treatment, started Effexor for now If persistent, can switch to Lyrica

## 2024-01-11 NOTE — Progress Notes (Signed)
 Virtual Visit via Video Note   Because of Hisayo Grulke's co-morbid illnesses, she is at least at moderate risk for complications without adequate follow up.  This format is felt to be most appropriate for this patient at this time.  All issues noted in this document were discussed and addressed.  A limited physical exam was performed with this format.      Evaluation Performed:  Follow-up visit  Date:  01/11/2024   ID:  Charmel Pronovost, DOB [birth year], MRN [id number]  Patient Location: Home/Car Provider Location: Home Office  Participants: Patient Location of Patient: Home Location of Provider: Telehealth Consent was obtain for visit to be over via telehealth. I verified that I am speaking with the correct person using two identifiers.  PCP:  Anabel Halon, MD   Chief Complaint: MDD  History of Present Illness:    Donna Munoz is a 48 y.o. 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  She reports apathy, insomnia, decreased appetite, decreased concentration and easy irritability for the last 12 months.  She has tried amitriptyline in the past, which caused drowsiness.  Denies any SI or HI currently.  She was doing better with Cymbalta, but stopped taking it due to persistent nausea.  Of note, she took it in the morning and later in the evening, but still had nausea.  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  She completed immunotherapy with pembrolizumab in 2022.  She is followed by oncology currently.   Peripheral neuropathy: She has chronic numbness of the right LE > left LE and left UE.  She has tried gabapentin, but was causing mild drowsiness and stopped taking gabapentin now.  She also reports chronic, intermittent low back pain, worse with standing.  Denies any recent injury or fall.  She was given Cymbalta in the last visit, but had nausea  with it.  H Pylori infection: She had EGD for GERD and nausea on 12/15/23, was found to have H. pylori infection on biopsy.  She was given quadruple therapy, but had an allergic reaction to it.  She was later given amoxicillin and Levaquin, which she is going to start with omeprazole 40 mg twice daily.  Cluster headache: She reports daily left-sided headache, which lasts for few minutes to hours, associated with watering of the eyes.  Does not report visual disturbance currently.  Does not report nasal congestion, postnasal drip or cough currently.  The patient does not have symptoms concerning for COVID-19 infection (fever, chills, cough, or new shortness of breath).   Past Medical, Surgical, Social History, Allergies, and Medications have been Reviewed.  Past Medical History:  Diagnosis Date   Anemia    Anxiety    Depression    GERD (gastroesophageal reflux disease)    Headache    Shingles    Tobacco abuse 05/06/2020   Past Surgical History:  Procedure Laterality Date   AMPUTATION TOE Right 03/07/2020   Procedure: RIGHT THIRD TOE AMPUTATION;  Surgeon: Almond Lint, MD;  Location: [facility];  Service: General;  Laterality: Right;   BREAST LUMPECTOMY  age 89   CESAREAN SECTION  01/06/2006   CESAREAN SECTION  10/15/2016   DENTAL SURGERY  2019   INGUINAL HERNIA REPAIR Right 01/30/2020   Procedure: RIGHT INGUINAL HERNIA REPAIR WITH  MESH;  Surgeon: Almond Lint, MD;  Location: [facility];  Service: General;  Laterality: Right;   INSERTION OF MESH Right 01/30/2020   Procedure: INSERTION OF MESH;  Surgeon: Almond Lint, MD;  Location: [facility];  Service: General;  Laterality: Right;   IR CV LINE INJECTION  09/11/2020   IR IMAGING GUIDED PORT INSERTION  09/12/2020   IR REMOVAL TUN ACCESS W/ PORT W/O FL MOD SED  09/12/2020   IRRIGATION AND DEBRIDEMENT ABSCESS Right 03/07/2020   Procedure: Aspiration of Right Groin Seroma;  Surgeon: Almond Lint, MD;  Location: [facility]  OR;  Service: General;  Laterality: Right;   MELANOMA EXCISION WITH SENTINEL LYMPH NODE BIOPSY Right 01/30/2020   Procedure: WIDE LOWER EXCISION RIGHT THIRD TOE MELANOMA EXCISION WITH SENTINEL LYMPH NODE BIOPSY;  Surgeon: Almond Lint, MD;  Location: [facility];  Service: General;  Laterality: Right;   PORT-A-CATH REMOVAL N/A 08/12/2022   Procedure: MINOR REMOVAL PORT-A-CATH;  Surgeon: Franky Macho, MD;  Location: [facility];  Service: General;  Laterality: N/A;   PORTACATH PLACEMENT N/A 03/07/2020   Procedure: INSERTION PORT-A-CATH;  Surgeon: Almond Lint, MD;  Location: [facility];  Service: General;  Laterality: Left     Current Meds  Medication Sig   amoxicillin (AMOXIL) 500 MG tablet Take 2 tablets (1,000 mg total) by mouth 2 (two) times daily for 14 days.   Bismuth 262 MG CHEW Chew 524 mg by mouth in the morning, at noon, in the evening, and at bedtime.   doxycycline (VIBRAMYCIN) 100 MG capsule Take 1 capsule (100 mg total) by mouth 2 (two) times daily.   levofloxacin (LEVAQUIN) 500 MG tablet Take 1 tablet (500 mg total) by mouth daily for 14 days.   levothyroxine (SYNTHROID) 25 MCG tablet Take 1 tablet (25 mcg total) by mouth daily before breakfast.   omeprazole (PRILOSEC) 20 MG capsule Take 1 capsule (20 mg total) by mouth 2 (two) times daily before a meal.   omeprazole (PRILOSEC) 40 MG capsule Take 1 capsule (40 mg total) by mouth daily.   venlafaxine XR (EFFEXOR XR) 75 MG 24 hr capsule Take 1 capsule (75 mg total) by mouth daily with breakfast.   Vibegron (GEMTESA) 75 MG TABS Take 1 tablet (75 mg total) by mouth daily.   Vitamin D, Ergocalciferol, (DRISDOL) 1.25 MG (50000 UNIT) CAPS capsule Take 1 capsule (50,000 Units total) by mouth every 7 (seven) days.   [DISCONTINUED] gabapentin (NEURONTIN) 100 MG capsule Take by mouth.   [DISCONTINUED] mirabegron ER (MYRBETRIQ) 25 MG TB24 tablet Take 1 tablet (25 mg total) by mouth daily.   [DISCONTINUED] solifenacin (VESICARE) 5 MG  tablet Take 1 tablet (5 mg total) by mouth daily.   [DISCONTINUED] sulfamethoxazole-trimethoprim (BACTRIM DS) 800-160 MG tablet Take 1 tablet by mouth 2 (two) times daily.     Allergies:   Doxycycline, Gadavist [gadobutrol], and Metronidazole   ROS:   Please see the history of present illness. All other systems reviewed and are negative.   Labs/Other Tests and Data Reviewed:    Recent Labs: 11/22/2023: ALT 15; BUN 15; Creatinine, Ser 0.74; Hemoglobin 15.2; Platelets 264; Potassium 3.9; Sodium 133; TSH 3.164   Recent Lipid Panel Lab Results  Component Value Date/Time   CHOL 144 06/17/2021 11:24 AM   CHOL 147 09/02/2020 10:09 AM   TRIG 86 06/17/2021 11:24 AM   HDL 29 (L) 06/17/2021 11:24 AM   HDL 29 (L) 09/02/2020 10:09 AM   CHOLHDL 5.0 06/17/2021 11:24 AM   LDLCALC 98 06/17/2021 11:24 AM   LDLCALC 104 (  H) 09/02/2020 10:09 AM    Wt Readings from Last 3 Encounters:  12/21/23 110 lb (49.9 kg)  11/29/23 114 lb (51.7 kg)  11/22/23 110 lb (49.9 kg)     Objective:    Vital Signs:  There were no vitals taken for this visit.   VITAL SIGNS:  reviewed GEN:  no acute distress EYES:  sclerae anicteric, EOMI - Extraocular Movements Intact RESPIRATORY:  normal respiratory effort, symmetric expansion NEURO:  alert and oriented x 3, no obvious focal deficit PSYCH:  normal affect  ASSESSMENT & PLAN:    Moderate episode of recurrent major depressive disorder (HCC) Uncontrolled, was improving with Cymbalta, but had persistent nausea Started Effexor 75 mg once daily - can have limited benefit with cluster headache and neuropathy as well  Gastroesophageal reflux disease with esophagitis without hemorrhage Her epigastric pain and nausea is likely due to GERD with esophagitis Had EGD -showed H Pylori, needs to complete treatment Continue omeprazole 40 mg BID Follow up with GI Avoid hot and spicy food Needs to cut down soft drink intake and quit smoking  Helicobacter pylori  gastritis Had EGD -showed H Pylori, needs to complete treatment Continue omeprazole 40 mg BID Follow up with GI  Intractable chronic cluster headache Left-sided headache, mostly periorbital with watering of eyes Could be cluster headache - referred to neurology Started Effexor for MDD, could help with headache as well  Peripheral polyneuropathy Chronic numbness of the RLE > LLE and LUE Likely due to polyneuropathy DPA pulse intact Had prescribed Cymbalta 30 mg QD for neuropathy and MDD - but c/o nausea, unclear if related to H Pylori, but she preferred to change treatment, started Effexor for now If persistent, can switch to Lyrica  Melanoma of skin (HCC) Status post amputation of right third toe Followed by oncology  Vitamin D deficiency Last vitamin D Lab Results  Component Value Date   VD25OH 8.8 (L) 09/13/2023   Had started Vitamin D 50,000 IU qw  Vitamin B12 deficiency Advised to come to office for B12 injection once monthly Continue Vitamin B12 1000 mcg QD    I discussed the assessment and treatment plan with the patient. The patient was provided an opportunity to ask questions, and all were answered. The patient agreed with the plan and demonstrated an understanding of the instructions.   The patient was advised to call back or seek an in-person evaluation if the symptoms worsen or if the condition fails to improve as anticipated.  The above assessment and management plan was discussed with the patient. The patient verbalized understanding of and has agreed to the management plan.   Medication Adjustments/Labs and Tests Ordered: Current medicines are reviewed at length with the patient today.  Concerns regarding medicines are outlined above.   Tests Ordered: Orders Placed This Encounter  Procedures   Ambulatory referral to Neurology    Medication Changes: Meds ordered this encounter  Medications   venlafaxine XR (EFFEXOR XR) 75 MG 24 hr capsule    Sig:  Take 1 capsule (75 mg total) by mouth daily with breakfast.    Dispense:  30 capsule    Refill:  3     Note: This dictation was prepared with Dragon dictation along with smaller phrase technology. Similar sounding words can be transcribed inadequately or may not be corrected upon review. Any transcriptional errors that result from this process are unintentional.      Disposition:  Follow up  Signed, Anabel Halon, MD  01/11/2024 2:37 PM  [facility]

## 2024-01-11 NOTE — Assessment & Plan Note (Signed)
Status post amputation of right third toe Followed by oncology

## 2024-01-11 NOTE — Assessment & Plan Note (Signed)
 Advised to come to office for B12 injection once monthly Continue Vitamin B12 1000 mcg QD

## 2024-01-11 NOTE — Telephone Encounter (Signed)
 The prescriptions have been sent as ordered  The pt was instructed per Dr Tomasa Rand

## 2024-01-11 NOTE — Assessment & Plan Note (Signed)
 Last vitamin D Lab Results  Component Value Date   VD25OH 8.8 (L) 09/13/2023   Had started Vitamin D 50,000 IU qw

## 2024-01-11 NOTE — Assessment & Plan Note (Signed)
 Had EGD -showed H Pylori, needs to complete treatment Continue omeprazole 40 mg BID Follow up with GI

## 2024-01-11 NOTE — Patient Instructions (Addendum)
 Please start taking Effexor as prescribed.  Please stop taking Cymbalta.  Please continue to take other medications as prescribed.  Avoid hot, spicy food and cut down soft drinks.

## 2024-01-11 NOTE — Assessment & Plan Note (Signed)
 Uncontrolled, was improving with Cymbalta, but had persistent nausea Started Effexor 75 mg once daily - can have limited benefit with cluster headache and neuropathy as well

## 2024-02-03 ENCOUNTER — Other Ambulatory Visit: Payer: Self-pay | Admitting: Internal Medicine

## 2024-02-03 DIAGNOSIS — F331 Major depressive disorder, recurrent, moderate: Secondary | ICD-10-CM

## 2024-02-10 ENCOUNTER — Encounter: Payer: Self-pay | Admitting: Neurology

## 2024-02-10 ENCOUNTER — Ambulatory Visit: Admitting: Neurology

## 2024-02-10 VITALS — BP 103/65 | HR 59 | Ht 62.0 in | Wt 112.0 lb

## 2024-02-10 DIAGNOSIS — H539 Unspecified visual disturbance: Secondary | ICD-10-CM | POA: Diagnosis not present

## 2024-02-10 DIAGNOSIS — R51 Headache with orthostatic component, not elsewhere classified: Secondary | ICD-10-CM

## 2024-02-10 DIAGNOSIS — G44021 Chronic cluster headache, intractable: Secondary | ICD-10-CM | POA: Diagnosis not present

## 2024-02-10 DIAGNOSIS — R519 Headache, unspecified: Secondary | ICD-10-CM

## 2024-02-10 DIAGNOSIS — Z85820 Personal history of malignant melanoma of skin: Secondary | ICD-10-CM | POA: Diagnosis not present

## 2024-02-10 DIAGNOSIS — G43709 Chronic migraine without aura, not intractable, without status migrainosus: Secondary | ICD-10-CM | POA: Diagnosis not present

## 2024-02-10 MED ORDER — SUMATRIPTAN SUCCINATE 100 MG PO TABS: 100.0000 mg | ORAL_TABLET | Freq: Once | ORAL | 12 refills | Status: DC | PRN

## 2024-02-10 MED ORDER — EMGALITY 120 MG/ML ~~LOC~~ SOAJ: 120.0000 mg | SUBCUTANEOUS | 11 refills | Status: DC

## 2024-02-10 NOTE — Patient Instructions (Addendum)
 Imitrex: Please take one tablet at the onset of your headache. If it does not improve the symptoms please take one additional tablet in 2 hours. Do not take more then 2 tablets in 24hrs.   Start Emgality monthly for prevention.   Cluster Headache  A cluster headache is a type of primary headache that causes deep, intense head pain. Cluster headaches can last from 15 minutes to 3 hours. They usually occur on one side of the head or face. They may occur on the other side of the head when a new cluster of headaches starts. Often times, cluster headaches: Happen repeatedly over weeks to months. Happen many times a day. Occur at the same time of day, often at night. Happen in the fall and springtime. What are the causes? The cause of this condition is not known. Unlike migraine and tension headaches, a cluster headache is not usually related to triggers, such as foods, hormonal changes, or stress. What increases the risk? The following factors may make you likely to develop this condition: Being a female between the ages of 75-49 years old. Smoking or using products that contain nicotine or tobacco. Having high histamine levels. This can happen in people who have allergies. Taking medicines that cause blood vessels to get bigger, such as nitroglycerin. Having a parent or sibling who has cluster headaches. What are the signs or symptoms? Symptoms of this condition include: Very bad pain on one side of the head that begins behind or around your eye or temple. The pain may move to other areas of the face, head, and neck. Nausea. Sensitivity to light. Runny nose and nasal stuffiness. Sweating on the affected side of the face or forehead. Droopy or swollen eyelid, eye redness, or tearing on the affected side. Feeling restless or upset. Pale skin or reddened face (flushed). How is this diagnosed? Cluster headaches may be diagnosed based on: Your symptoms and the description of the attacks,  including your pain level, where you feel the headaches, and when they start. How often your headaches happen and how long they last. A neurological exam to find physical signs of a neurological disorder. Your senses, reflexes, and nerves will be tested. The exam is usually normal in people who have cluster headaches. Tests to rule out other medical conditions. Tests may include: MRI. CT scan. Blood tests. How is this treated? Cluster headaches may be treated with: Medicines to relieve pain and to prevent repeated attacks. Some people may need more than one medicine. Oxygen that is breathed in through a mask. This helps to relieve pain of an attack in 15-20 minutes. An injection near the base of the skull (occipital nerve block) with a steroid and numbing medicine. Surgery in very bad cases. Follow these instructions at home: Headache diary Keep a headache diary. Doing this can help you and your health care provider find out what triggers your headaches. In the diary, include: What time your headache started and what you were doing when it began. How long your headache lasted. Where your pain started and if it moved to other areas. The type of pain, such as burning, stabbing, throbbing, or cramping. Your pain level. Use a pain scale and rate the pain with a number from 1 (mild) up to 10 (severe). The treatment that you used, and any change in symptoms after treatment. Medicines Take over-the-counter and prescription medicines only as told by your provider. Ask your provider if the medicine prescribed to you: Requires you to avoid driving  or using machinery. Can cause constipation. You may need to take these actions to prevent or treat constipation: Drink enough fluid to keep your pee (urine) pale yellow. Take over-the-counter or prescription medicines. Eat foods that are high in fiber, such as beans, whole grains, and fresh fruits and vegetables. Limit foods that are high in fat and  processed sugars, such as fried or sweet foods. Lifestyle Get 7-9 hours of sleep each night, or the amount recommended by your provider. Limit or manage stress. Talk to your provider about stress relief options, such as acupuncture, counseling, biofeedback, and massage. Exercise regularly. Exercise for at least 30 minutes, 5 times each week. Moderate exercise may be best. Eat a healthy diet and avoid any foods that may trigger your headaches. Do not drink alcohol. Alcohol may quickly trigger a severe headache. Do not use any products that contain nicotine or tobacco. These products include cigarettes, chewing tobacco, and vaping devices, such as e-cigarettes. If you need help quitting, ask your provider. General instructions Use oxygen as told by your provider. Contact a health care provider if: Your headaches change, get worse, or happen more often. The medicine or oxygen that your provider recommends does not help. Get help right away if: You faint. You have weakness or numbness, especially on one side of your body or face. You have double vision. You have nausea or vomiting that does not go away after many hours. You have trouble talking, walking, or keeping your balance. You have pain or stiffness in your neck and you have a fever. This information is not intended to replace advice given to you by your health care provider. Make sure you discuss any questions you have with your health care provider. Document Revised: 06/22/2022 Document Reviewed: 06/22/2022 Elsevier Patient Education  2024 [employer]  Migraine Headache A migraine headache is an intense pulsing or throbbing pain on one or both sides of the head. Migraine headaches may also cause other symptoms, such as nausea, vomiting, and sensitivity to light and noise. A migraine headache can last from 4 hours to 3 days. Talk with your health care provider about what things may bring on (trigger) your migraine headaches. What are  the causes? The exact cause is not known. However, a migraine may be caused when nerves in the brain get irritated and release chemicals that cause blood vessels to become inflamed. This inflammation causes pain. Migraines may be triggered or caused by: Smoking. Medicines, such as: Nitroglycerin, which is used to treat chest pain. Birth control pills. Estrogen. Certain blood pressure medicines. Foods or drinks that contain nitrates, glutamate, aspartame, MSG, or tyramine. Certain foods or drinks, such as aged cheeses, chocolate, alcohol, or caffeine. Doing physical activity that is very hard. Other triggers may include: Menstruation. Pregnancy. Hunger. Stress. Getting too much or too little sleep. Weather changes. Tiredness (fatigue). What increases the risk? The following factors may make you more likely to have migraine headaches: Being between the ages of 50-55 years old. Being female. Having a family history of migraine headaches. Being Caucasian. Having a mental health condition, such as depression or anxiety. Being obese. What are the signs or symptoms? The main symptom of this condition is pulsing or throbbing pain. This pain may: Happen in any area of the head, such as on one or both sides. Make it hard to do daily activities. Get worse with physical activity. Get worse around bright lights, loud noises, or smells. Other symptoms may include: Nausea. Vomiting. Dizziness. Before a migraine  headache starts, you may get warning signs (an aura). An aura may include: Seeing flashing lights or having blind spots. Seeing bright spots, halos, or zigzag lines. Having tunnel vision or blurred vision. Having numbness or a tingling feeling. Having trouble talking. Having muscle weakness. After a migraine ends, you may have symptoms. These may include: Feeling tired. Trouble concentrating. How is this diagnosed? A migraine headache can be diagnosed based on: Your  symptoms. A physical exam. Tests, such as: A CT scan or an MRI of the head. These tests can help rule out other causes of headaches. Taking fluid from the spine (lumbar puncture) to examine it (cerebrospinal fluid analysis, or CSF analysis). How is this treated? This condition may be treated with medicines that: Relieve pain and nausea. Prevent migraines. Treatment may also include: Acupuncture. Lifestyle changes like avoiding foods that trigger migraine headaches. Learning ways to control your body (biofeedback). Talk therapy to help you know and deal with negative thoughts (cognitive behavioral therapy). Follow these instructions at home: Medicines Take over-the-counter and prescription medicines only as told by your provider. Ask your provider if the medicine prescribed to you: Requires you to avoid driving or using machinery. Can cause constipation. You may need to take these actions to prevent or treat constipation: Drink enough fluid to keep your pee (urine) pale yellow. Take over-the-counter or prescription medicines. Eat foods that are high in fiber, such as beans, whole grains, and fresh fruits and vegetables. Limit foods that are high in fat and processed sugars, such as fried or sweet foods. Lifestyle  Do not drink alcohol. Do not use any products that contain nicotine or tobacco. These products include cigarettes, chewing tobacco, and vaping devices, such as e-cigarettes. If you need help quitting, ask your provider. Get 7-9 hours of sleep each night, or the amount recommended by your provider. Find ways to manage stress, such as meditation, deep breathing, or yoga. Try to exercise regularly. This can help lessen how bad and how often your migraines occur. General instructions Keep a journal to find out what triggers your migraines, so you can avoid those things. For example, write down: What you eat and drink. How much sleep you get. Any change to your diet or  medicines. If you have a migraine headache: Avoid things that make your symptoms worse, such as bright lights. Lie down in a dark, quiet room. Do not drive or use machinery. Ask your provider what activities are safe for you while you have symptoms. Keep all follow-up visits. Your provider will monitor your symptoms and recommend any further treatment. Where to find more information Coalition for Headache and Migraine Patients (CHAMP): headachemigraine.org American Migraine Foundation: americanmigrainefoundation.org National Headache Foundation: headaches.org Contact a health care provider if: You have symptoms that are different or worse than your usual migraine headache symptoms. You have more than 15 days of headaches in one month. Get help right away if: Your migraine headache becomes severe or lasts more than 72 hours. You have a fever or stiff neck. You have vision loss. Your muscles feel weak or like you cannot control them. You lose your balance often or have trouble walking. You faint. You have a seizure. This information is not intended to replace advice given to you by your health care provider. Make sure you discuss any questions you have with your health care provider. Document Revised: 06/22/2022 Document Reviewed: 06/22/2022 Elsevier Patient Education  2024 Elsevier Inc.  [place] Injection What is this medication? GALCANEZUMAB (gal ka NEZ ue  mab) prevents migraines. It works by blocking a substance in the body that causes migraines. It may also be used to treat cluster headaches. It is a monoclonal antibody. This medicine may be used for other purposes; ask your health care provider or pharmacist if you have questions. COMMON BRAND NAME(S): Emgality What should I tell my care team before I take this medication? They need to know if you have any of these conditions: An unusual or allergic reaction to galcanezumab, other medications, foods, dyes, or  preservatives Pregnant or trying to get pregnant Breast-feeding How should I use this medication? This medication is injected under the skin. You will be taught how to prepare and give it. Take it as directed on the prescription label. Keep taking it unless your care team tells you to stop. It is important that you put your used needles and syringes in a special sharps container. Do not put them in a trash can. If you do not have a sharps container, call your pharmacist or care team to get one. Talk to your care team about the use of this medication in children. Special care may be needed. Overdosage: If you think you have taken too much of this medicine contact a poison control center or emergency room at once. NOTE: This medicine is only for you. Do not share this medicine with others. What if I miss a dose? If you miss a dose, take it as soon as you can. If it is almost time for your next dose, take only that dose. Do not take double or extra doses. What may interact with this medication? Interactions are not expected. This list may not describe all possible interactions. Give your health care provider a list of all the medicines, herbs, non-prescription drugs, or dietary supplements you use. Also tell them if you smoke, drink alcohol, or use illegal drugs. Some items may interact with your medicine. What should I watch for while using this medication? Visit your care team for regular checks on your progress. Tell your care team if your symptoms do not start to get better or if they get worse. What side effects may I notice from receiving this medication? Side effects that you should report to your care team as soon as possible: Allergic reactions or angioedema--skin rash, itching or hives, swelling of the face, eyes, lips, tongue, arms, or legs, trouble swallowing or breathing Side effects that usually do not require medical attention (report to your care team if they continue or are  bothersome): Pain, redness, or irritation at injection site This list may not describe all possible side effects. Call your doctor for medical advice about side effects. You may report side effects to FDA at 1-800-FDA-1088. Where should I keep my medication? Keep out of the reach of children and pets. Store in a refrigerator or at room temperature between 20 and 25 degrees C (68 and 77 degrees F). Refrigeration (preferred): Store in the refrigerator. Do not freeze. Keep in the original container until you are ready to take it. Remove the dose from the carton about 30 minutes before it is time for you to use it. If the dose is not used, it may be stored in original container at room temperature for 7 days. Get rid of any unused medication after the expiration date. Room Temperature: This medication may be stored at room temperature for up to 7 days. Keep it in the original container. Protect from light until time of use. If it is stored at  room temperature, get rid of any unused medication after 7 days or after it expires, whichever is first. To get rid of medications that are no longer needed or have expired: Take the medication to a medication take-back program. Check with your pharmacy or law enforcement to find a location. If you cannot return the medication, ask your pharmacist or care team how to get rid of this medication safely. NOTE: This sheet is a summary. It may not cover all possible information. If you have questions about this medicine, talk to your doctor, pharmacist, or health care provider.  2024 Elsevier/Gold Standard (2021-12-22 00:00:00)Sumatriptan Tablets What is this medication? SUMATRIPTAN (soo ma TRIP tan) treats migraines. It works by blocking pain signals and narrowing blood vessels in the brain. It belongs to a group of medications called triptans. It is not used to prevent migraines. This medicine may be used for other purposes; ask your health care provider or pharmacist  if you have questions. COMMON BRAND NAME(S): Imitrex, Migraine Pack What should I tell my care team before I take this medication? They need to know if you have any of these conditions: Circulation problems in fingers and toes Diabetes Heart disease High blood pressure High cholesterol History of irregular heartbeat History of stroke Kidney disease Liver disease Stomach or intestine problems Tobacco use An unusual or allergic reaction to sumatriptan, other medications, foods, dyes, or preservatives Pregnant or trying to get pregnant Breastfeeding How should I use this medication? Take this medication by mouth with a glass of water. Follow the directions on the prescription label. Do not take it more often than directed. Talk to your care team about the use of this medication in children. Special care may be needed. Overdosage: If you think you have taken too much of this medicine contact a poison control center or emergency room at once. NOTE: This medicine is only for you. Do not share this medicine with others. What if I miss a dose? This does not apply. This medication is not for regular use. What may interact with this medication? Do not take this medication with any of the following: Certain medications for migraine headache, such as almotriptan, eletriptan, frovatriptan, naratriptan, rizatriptan, sumatriptan, zolmitriptan Ergot alkaloids, such as dihydroergotamine, ergonovine, ergotamine, methylergonovine MAOIs, such as Carbex, Eldepryl, Marplan, Nardil, and Parnate This medication may also interact with the following: Certain medications for mental health conditions This list may not describe all possible interactions. Give your health care provider a list of all the medicines, herbs, non-prescription drugs, or dietary supplements you use. Also tell them if you smoke, drink alcohol, or use illegal drugs. Some items may interact with your medicine. What should I watch for while  using this medication? Visit your care team for regular checks on your progress. Tell your care team if your symptoms do not start to get better or if they get worse. This medication may affect your coordination, reaction time, or judgment. Do not drive or operate machinery until you know how this medication affects you. Sit up or stand slowly to reduce the risk of dizzy or fainting spells. Drinking alcohol with this medication can increase the risk of these side effects. Tell your care team right away if you have any change in your eyesight. If you take migraine medications for 10 or more days a month, your migraines may get worse. Keep a diary of headache days and medication use. Contact your care team if your migraine attacks occur more frequently. What side effects may I  notice from receiving this medication? Side effects that you should report to your care team as soon as possible: Allergic reactions--skin rash, itching, hives, swelling of the face, lips, tongue, or throat Burning, pain, tingling, or color changes in the legs or feet Heart attack--pain or tightness in the chest, shoulders, arms, or jaw, nausea, shortness of breath, cold or clammy skin, feeling faint or lightheaded Heart rhythm changes--fast or irregular heartbeat, dizziness, feeling faint or lightheaded, chest pain, trouble breathing Increase in blood pressure Raynaud's--cool, numb, or painful fingers or toes that may change color from pale, to blue, to red Seizures Serotonin syndrome--irritability, confusion, fast or irregular heartbeat, muscle stiffness, twitching muscles, sweating, high fever, seizure, chills, vomiting, diarrhea Stroke--sudden numbness or weakness of the face, arm, or leg, trouble speaking, confusion, trouble walking, loss of balance or coordination, dizziness, severe headache, change in vision Sudden or severe stomach pain, nausea, vomiting, fever, or bloody diarrhea Vision loss Side effects that usually  do not require medical attention (report to your care team if they continue or are bothersome): Dizziness General discomfort or fatigue This list may not describe all possible side effects. Call your doctor for medical advice about side effects. You may report side effects to FDA at 1-800-FDA-1088. Where should I keep my medication? Keep out of the reach of children and pets. Store at room temperature between 2 and 30 degrees C (36 and 86 degrees F). Throw away any unused medication after the expiration date. NOTE: This sheet is a summary. It may not cover all possible information. If you have questions about this medicine, talk to your doctor, pharmacist, or health care provider.  2024 Elsevier/Gold Standard (2022-09-01 00:00:00)

## 2024-02-10 NOTE — Progress Notes (Signed)
 [facility]    Provider:  Dr Lucia Gaskins Requesting Provider: Anabel Halon, MD Primary Care Provider:  Anabel Halon, MD  CC:  Headaches  HPI:  Donna Munoz is a 48 y.o. female here as requested by Anabel Halon, MD for cluster headaches. has Melanoma of skin (HCC); IDA (iron deficiency anemia); Tobacco abuse; Hypothyroidism; Gastroesophageal reflux disease with esophagitis without hemorrhage; Moderate episode of recurrent major depressive disorder (HCC); Peripheral polyneuropathy; Malignant melanoma of skin of toe, right (HCC); Kidney stones; Vitamin D deficiency; Vitamin B12 deficiency; Helicobacter pylori gastritis; Intractable chronic cluster headache; and Chronic migraine without aura without status migrainosus, not intractable on their problem list.  She has had heachache for years ongoing in 2020 diagnosed with stage 3 melanoma from a frckle on her toe they amputated the toe and after that her headaches worsened daily now she has 3-4 a week and they last for days last for 9 days. Always on the left side of the face, affects her left eye, left side of face feels aweful and numb, feels like somoene is jabbing something in the eye steady pain for up to 5 hours, eye waters just on the left side, severe, light sensitivity, she can;t stand noise, light she has nausea and vomiting, sleeping helps, nausea, can be pulsating/pounding/throbbing, turning her eyes will hurt, always on the left side, continuous, she has had headaches since the age of 89 after the age of 89, no Fhx of headaches, she had an MRi in 2023 which did show any etilogy, her uncle had headaches and quit smoking. At least 12 migraine or cluster headaches a month and >15 total headache days a month the pain can be continuous for 9 days severe, no medication overuse, no aura. She can't eat. Just sleep helps. Usually after waking up and getting up in themorning and moving then the headaches start, unknown triggers.  Symptoms have been worsening since 2023 when last had MRI, left eye vision changes, no jaw pain or fever or associated neck pain. No other focal neurologic deficits, associated symptoms, inciting events or modifiable factors.  Reviewed notes, labs and imaging from outside physicians, which showed:  Medications tried that can be used in management: amitriptyline, cymbalta, gabapentin, effexor, Cannot take verapamil or propranolol or other blood pressure medications due to very low BP 103 systolic today and sequelae of hypotension, Topiramate contraindicated due to kidney stones, Lithium contraindicated due to psychiatric disease which may be worsened by the addition of other psychiatric medications such as lithium she has already tried other SSRIs and SNRIs hesiate to add Lithium. Aimovig contraindicated due to constipation.   04/27/2022: CLINICAL DATA:  Melanoma   EXAM: MRI HEAD WITHOUT AND WITH CONTRAST   TECHNIQUE: Multiplanar, multiecho pulse sequences of the brain and surrounding structures were obtained without and with intravenous contrast.   CONTRAST:  5mL GADAVIST GADOBUTROL 1 MMOL/ML IV SOLN   COMPARISON:  06/17/2021   FINDINGS: Brain: No restricted diffusion to suggest acute or subacute infarct. No acute hemorrhage, mass, mass effect, or midline shift. No hydrocephalus or extra-axial collection. No abnormal parenchymal or meningeal enhancement. Scattered T2 hyperintense signal in the periventricular white matter, which are nonspecific and appear unchanged compared to the prior exam. Normal pituitary and craniocervical junction.   Vascular: Normal arterial flow voids.  Normal vascular enhancement.   Skull and upper cervical spine: Normal marrow signal. No abnormal calvarial enhancement.   Sinuses/Orbits: No acute finding.   Other: The mastoids are well aerated.  IMPRESSION: No evidence of intracranial metastatic disease.     Electronically Signed   By: Wiliam Ke  M.D.   On: 04/27/2022 03:01 Recent Results (from the past 2160 hours)  Ferritin     Status: None   Collection Time: 11/22/23  9:54 AM  Result Value Ref Range   Ferritin 31 11 - 307 ng/mL    Comment: Performed at [facility], [address] [place], [place] [address]  Iron and TIBC [facility]/[place] ONLY)     Status: Abnormal   Collection Time: 11/22/23  9:54 AM  Result Value Ref Range   Iron 36 28 - 170 ug/dL   TIBC 562 130 - 865 ug/dL   Saturation Ratios 9 (L) 10.4 - 31.8 %   UIBC 390 ug/dL    Comment: Performed at [facility], [address] [place], [place] [address]  TSH     Status: None   Collection Time: 11/22/23  9:54 AM  Result Value Ref Range   TSH 3.164 0.350 - 4.500 uIU/mL    Comment: Performed by a 3rd Generation assay with a functional sensitivity of <=0.01 uIU/mL. Performed at [facility], [address], [place], [place] [address]   Lactate dehydrogenase     Status: Abnormal   Collection Time: 11/22/23  9:54 AM  Result Value Ref Range   LDH 96 (L) 98 - 192 U/L    Comment: Performed at [facility], [address] [place], [place] [address]  Comprehensive metabolic panel     Status: Abnormal   Collection Time: 11/22/23  9:54 AM  Result Value Ref Range   Sodium 133 (L) 135 - 145 mmol/L   Potassium 3.9 3.5 - 5.1 mmol/L   Chloride 104 98 - 111 mmol/L   CO2 22 22 - 32 mmol/L   Glucose, Bld 129 (H) 70 - 99 mg/dL    Comment: Glucose reference range applies only to samples taken after fasting for at least 8 hours.   BUN 15 6 - 20 mg/dL   Creatinine, Ser 4.40 0.44 - 1.00 mg/dL   Calcium 10.2 8.9 - 72.5 mg/dL   Total Protein 7.8 6.5 - 8.1 g/dL   Albumin 4.5 3.5 - 5.0 g/dL   AST 19 15 - 41 U/L   ALT 15 0 - 44 U/L   Alkaline Phosphatase 61 38 - 126 U/L   Total Bilirubin 0.5 0.0 - 1.2 mg/dL   GFR, Estimated >36 >64 mL/min    Comment: (NOTE) Calculated using the CKD-EPI Creatinine Equation (2021)    Anion gap 7 5 - 15    Comment: Performed at [facility], [address], [place], [place] [address]  CBC with Differential     Status: Abnormal   Collection Time: 11/22/23  9:54 AM  Result Value Ref Range   WBC 5.1 4.0 - 10.5 K/uL   RBC 4.76 3.87 - 5.11 MIL/uL   Hemoglobin 15.2 (H) 12.0 - 15.0 g/dL   HCT 42.5 95.6 - 38.7 %   MCV 95.0 80.0 - 100.0 fL   MCH 31.9 26.0 - 34.0 pg   MCHC 33.6 30.0 - 36.0 g/dL   RDW 56.4 33.2 - 95.1 %   Platelets 264 150 - 400 K/uL   nRBC 0.0 0.0 - 0.2 %   Neutrophils Relative % 62 %   Neutro Abs 3.2 1.7 - 7.7 K/uL   Lymphocytes Relative 29 %   Lymphs Abs 1.4 0.7 - 4.0 K/uL   Monocytes Relative 6 %  Monocytes Absolute 0.3 0.1 - 1.0 K/uL   Eosinophils Relative 2 %   Eosinophils Absolute 0.1 0.0 - 0.5 K/uL   Basophils Relative 1 %   Basophils Absolute 0.0 0.0 - 0.1 K/uL   Immature Granulocytes 0 %   Abs Immature Granulocytes 0.01 0.00 - 0.07 K/uL    Comment: Performed at [facility], [address], [place], [place] [address]  Urinalysis, Routine w reflex microscopic     Status: None   Collection Time: 12/15/23  3:41 PM  Result Value Ref Range   Specific Gravity, UA 1.010 1.005 - 1.030   pH, UA 6.5 5.0 - 7.5   Color, UA Yellow Yellow   Appearance Ur Clear Clear   Leukocytes,UA Negative Negative   Protein,UA Negative Negative/Trace   Glucose, UA Negative Negative   Ketones, UA Negative Negative   RBC, UA Negative Negative   Bilirubin, UA Negative Negative   Urobilinogen, Ur 0.2 0.2 - 1.0 mg/dL   Nitrite, UA Negative Negative   Microscopic Examination Comment     Comment: Microscopic not indicated and not performed.  Surgical pathology ([facility])     Status: None   Collection Time: 12/21/23 12:00 AM  Result Value Ref Range   SURGICAL PATHOLOGY      SURGICAL PATHOLOGY [facility] [address] [place], [place] [address] Telephone [phone] or [phone] Fax [phone]  REPORT OF SURGICAL PATHOLOGY   Accession #: [id number] Patient Name:  SHANEDRA, LAVE Visit # : [id number]  MRN: [id number] Physician: Tiajuana Amass DOB/Age [birth year] (Age: 37) Gender: F Collected Date: 12/21/2023 Received Date: 12/23/2023  FINAL DIAGNOSIS       1. Surgical [P], gastric antrum bx :       GASTRIC ANTRAL MUCOSA WITH MILDLY ACTIVE CHRONIC HELICOBACTER PYLORI GASTRITIS.      IMMUNOHISTOCHEMISTRY FOR HELICOBACTER PYLORI IS POSITIVE.      NEGATIVE FOR DYSPLASIA AND MALIGNANCY.       2. Surgical [P], gastric body bx :       GASTRIC MUCOSA WITH MILDLY ACTIVE CHRONIC HELICOBACTER PYLORI GASTRITIS AND      INTESTINAL METAPLASIA.      IMMUNOHISTOCHEMISTRY FOR HELICOBACTER PYLORI IS POSITIVE.      NEGATIVE FOR DYSPLASIA AND CARCINOMA.       3. Surgical [P], gastric polypoid lesion bx :        WELL-DIFFERENTIATED NEUROENDOCRINE TUMOR, G1.      SEE NOTE.       4. Surgical [P], colon, descending polyp x 2, polyp (2) :       HYPERPLASTIC POLYP (2).       Diagnosis Note : Two fragments are involved by neoplasm characterized by nests      and tubules of epithelial cells with round, uniform nuclei without necrosis or      significant mitotic activity.Immunohistochemistry shows positivity with CD56 and      chromogranin and Ki-67 shows a proliferation rate of less than 2%.The morphology      and immunophenotype are consistent with well-differentiated neuroendocrine      tumor, G1 (carcinoid).Two of the fragments are involved with a estimated      greatest dimension of 0.8 cm in these biopsies.      ELECTRONIC SIGNATURE : Jacelyn Grip, John, Pathologist, Electronic Signature  MICROSCOPIC DESCRIPTION  CASE COMMENTS STAINS USED IN DIAGNOSIS: H&E H&E H&E Universal Negative Control-DAB H&E Stains used in diagnosis 1 H. Pylori IHC, 2 H. Pylori IHC, 3 CD 56, 3  Chromogranin A, 3 KI-67-NO ACIS Some of these immunohistochemical stains may have been developed and the performance characteristics determined by [facility].  Some may not  have been cleared or approved by the U.S. Food and Drug Administration.  The FDA has determined that such clearance or approval is not necessary.  This test is used for clinical purposes.  It should not be regarded as investigational or for research.  This laboratory is certified under the Clinical Laboratory Improvement Amendments of 1988 (CLIA-88) as qualified to perform high complexity clinical laboratory testing. IHC stains are performed on formalin fixed,paraffin embedded tissue using a 3,3"diaminobenzidine (DAB) chromogen and Leica Bond Autostainer System. The staining intensity of the nucleus is score manually and is reported as the percentage of tumor cell nuclei demonstrating specific nuclear staining. The specimens are fixed in 10% Neutral Formalin for at least 6 hours and up t o 72hrs. These tests are validated on decalcified tissue. Results should be interpreted with caution given the possibility of false negative results on decalcified specimens. Antibody Clones are as follows ER-clone 30F, PR-clone 16, Ki67- clone MM1.Some of these immunohistochemical stains may have been developed and the performance characteristics determined by [facility].    CLINICAL HISTORY  SPECIMEN(S) OBTAINED 1. Surgical [P], Gastric Antrum Bx 2. Surgical [P], Gastric Body Bx 3. Surgical [P], Gastric Polypoid Lesion Bx 4. Surgical [P], Colon, Descending Polyp X 2, Polyp (2)  SPECIMEN COMMENTS: 1. Gastroesophageal reflux disease, unspecified whether esophagitis present; colon cancer screening; gastric lesion; benign neoplasm of descending colon; duodenal diverticulum; esophageal diverticulum SPECIMEN CLINICAL INFORMATION: 1. R/O H.pylori 2. R/O dysplasia, chronic gastritis 3. R/O dysplasia 4. R/O adenoma    Gross Description 1. Received in for malin are tan, soft tissue fragments that are submitted in toto.Number: multiple, Size: 0.2 cm smallest to 0.5 cm largest, (1B) ( TT ) 2.  Received in formalin are tan, soft tissue fragments that are submitted in toto.Number: 5, Size: 0.2 cm smallest to 0.7 cm largest, (1B) ( TT ) 3. Received in formalin are tan, soft tissue fragments that are submitted in toto.Number: 2, Size: 0.3 cm smallest to 0.5 cm largest, (1B) ( TT ) 4. Received in formalin are tan, soft tissue fragments that are submitted in toto.Number: 3, Size: 0.3 cm smallest to 0.7 cm largest, (1B) ( TT )        Report signed out from the following location(s) [place] [address] [place], [place] [address] CLIA #: [id number]  [facility] [address] [place], [place] [address] CLIA #: [id number]      Review of Systems: Patient complains of symptoms per HPI as well as the following symptoms insomnia, decreased appetite. Pertinent negatives and positives per HPI. All others negative.   Social History   Socioeconomic History   Marital status: Legally Separated    Spouse name: Not on file   Number of children: 2   Years of education: Not on file   Highest education level: Not on file  Occupational History   Occupation: unemployed d/t COVID  Tobacco Use   Smoking status: Every Day    Current packs/day: 1.00    Average packs/day: 1 pack/day for 29.0 years (29.0 ttl pk-yrs)    Types: Cigarettes   Smokeless tobacco: Never  Vaping Use   Vaping status: Never Used  Substance and Sexual Activity   Alcohol use: No   Drug use: Yes    Frequency: 2.0 times per week  Types: Marijuana    Comment: "hardly ever, maybe 3x a month now"   Sexual activity: Yes  Other Topics Concern   Not on file  Social History Narrative   Right handed   Caffeine: 6 pack mtn dew cans per day   Lives with others   Social Drivers of Health   Financial Resource Strain: High Risk (12/08/2019)   Overall Financial Resource Strain (CARDIA)    Difficulty of Paying Living Expenses: Hard  Food Insecurity: No Food Insecurity  (12/08/2019)   Hunger Vital Sign    Worried About Running Out of Food in the Last Year: Never true    Ran Out of Food in the Last Year: Never true  Transportation Needs: No Transportation Needs (12/26/2020)   PRAPARE - Administrator, Civil Service (Medical): No    Lack of Transportation (Non-Medical): No  Physical Activity: Inactive (12/26/2020)   Exercise Vital Sign    Days of Exercise per Week: 0 days    Minutes of Exercise per Session: 0 min  Stress: Stress Concern Present (12/08/2019)   [employer] of Occupational Health - Occupational Stress Questionnaire    Feeling of Stress : Very much  Social Connections: Unknown (06/26/2023)   Received from [facility]   Social Network    Social Network: Not on file  Intimate Partner Violence: Not At Risk (06/26/2023)   Received from [facility]   HITS    Over the last 12 months how often did your partner physically hurt you?: Never    Over the last 12 months how often did your partner insult you or talk down to you?: Never    Over the last 12 months how often did your partner threaten you with physical harm?: Never    Over the last 12 months how often did your partner scream or curse at you?: Never    Family History  Problem Relation Age of Onset   Heart disease Mother    Cancer Mother    Ovarian cysts Sister    Healthy Sister    Healthy Sister    Healthy Brother    Cancer Maternal Grandmother    Heart attack Maternal Grandmother    Cancer Maternal Grandfather    Cancer Paternal Grandmother    Colon cancer Paternal Grandfather    Other Paternal Grandfather        brain tumor in his 55s. had metal plate inserted   Psoriasis Daughter    Healthy Son    Esophageal cancer Neg Hx    Rectal cancer Neg Hx    Stomach cancer Neg Hx    Migraines Neg Hx     Past Medical History:  Diagnosis Date   Anemia    Anxiety    Depression    GERD (gastroesophageal reflux disease)    Headache    Shingles    Tobacco abuse  05/06/2020    Patient Active Problem List   Diagnosis Date Noted   Chronic migraine without aura without status migrainosus, not intractable 02/10/2024   Helicobacter pylori gastritis 01/11/2024   Intractable chronic cluster headache 01/11/2024   Vitamin D deficiency 09/14/2023   Vitamin B12 deficiency 09/14/2023   Kidney stones 06/22/2023   Gastroesophageal reflux disease with esophagitis without hemorrhage 06/02/2023   Moderate episode of recurrent major depressive disorder (HCC) 06/02/2023   Peripheral polyneuropathy 06/02/2023   Malignant melanoma of skin of toe, right (HCC) 08/11/2021   Hypothyroidism 07/09/2020   Tobacco abuse 05/06/2020   IDA (iron  deficiency anemia) 03/25/2020   Melanoma of skin (HCC) 12/11/2019    Past Surgical History:  Procedure Laterality Date   AMPUTATION TOE Right 03/07/2020   Procedure: RIGHT THIRD TOE AMPUTATION;  Surgeon: Almond Lint, MD;  Location: [facility];  Service: General;  Laterality: Right;   BREAST LUMPECTOMY  age 37   CESAREAN SECTION  01/06/2006   CESAREAN SECTION  10/15/2016   DENTAL SURGERY  2019   INGUINAL HERNIA REPAIR Right 01/30/2020   Procedure: RIGHT INGUINAL HERNIA REPAIR WITH  MESH;  Surgeon: Almond Lint, MD;  Location: [facility];  Service: General;  Laterality: Right;   INSERTION OF MESH Right 01/30/2020   Procedure: INSERTION OF MESH;  Surgeon: Almond Lint, MD;  Location: [facility];  Service: General;  Laterality: Right;   IR CV LINE INJECTION  09/11/2020   IR IMAGING GUIDED PORT INSERTION  09/12/2020   IR REMOVAL TUN ACCESS W/ PORT W/O FL MOD SED  09/12/2020   IRRIGATION AND DEBRIDEMENT ABSCESS Right 03/07/2020   Procedure: Aspiration of Right Groin Seroma;  Surgeon: Almond Lint, MD;  Location: [facility];  Service: General;  Laterality: Right;   MELANOMA EXCISION WITH SENTINEL LYMPH NODE BIOPSY Right 01/30/2020   Procedure: WIDE LOWER EXCISION RIGHT THIRD TOE MELANOMA EXCISION WITH SENTINEL LYMPH  NODE BIOPSY;  Surgeon: Almond Lint, MD;  Location: [facility];  Service: General;  Laterality: Right;   PORT-A-CATH REMOVAL N/A 08/12/2022   Procedure: MINOR REMOVAL PORT-A-CATH;  Surgeon: Franky Macho, MD;  Location: [facility];  Service: General;  Laterality: N/A;   PORTACATH PLACEMENT N/A 03/07/2020   Procedure: INSERTION PORT-A-CATH;  Surgeon: Almond Lint, MD;  Location: [facility];  Service: General;  Laterality: Left    Current Outpatient Medications  Medication Sig Dispense Refill   Galcanezumab-gnlm (EMGALITY) 120 MG/ML SOAJ Inject 120 mg into the skin every 30 (thirty) days. 1.12 mL 11   omeprazole (PRILOSEC) 20 MG capsule Take 1 capsule (20 mg total) by mouth 2 (two) times daily before a meal. 28 capsule 0   SUMAtriptan (IMITREX) 100 MG tablet Take 1 tablet (100 mg total) by mouth once as needed for up to 1 dose. May repeat in 2 hours if headache persists or recurs. 12 tablet 12   venlafaxine XR (EFFEXOR-XR) 75 MG 24 hr capsule TAKE 1 CAPSULE BY MOUTH DAILY WITH BREAKFAST. 90 capsule 2   Vitamin D, Ergocalciferol, (DRISDOL) 1.25 MG (50000 UNIT) CAPS capsule Take 1 capsule (50,000 Units total) by mouth every 7 (seven) days. 12 capsule 1   No current facility-administered medications for this visit.    Allergies as of 02/10/2024 - Review Complete 02/10/2024  Allergen Reaction Noted   Doxycycline Rash 01/10/2024   Gadavist [gadobutrol] Nausea Only 04/24/2022   Metronidazole Rash 01/10/2024    Vitals: BP 103/65 (BP Location: Right Arm, Patient Position: Sitting, Cuff Size: Normal)   Pulse (!) 59   Ht 5\' 2"  (1.575 m)   Wt 112 lb (50.8 kg)   BMI 20.49 kg/m  Last Weight:  Wt Readings from Last 1 Encounters:  02/10/24 112 lb (50.8 kg)   Last Height:   Ht Readings from Last 1 Encounters:  02/10/24 5\' 2"  (1.575 m)     Physical exam: Exam: Gen: NAD, conversant, well nourised,  well groomed                     CV: RRR, no MRG. No Carotid Bruits. No peripheral  edema, warm, nontender  Eyes: Conjunctivae clear without exudates or hemorrhage  Neuro: Detailed Neurologic Exam  Speech:    Speech is normal; fluent and spontaneous with normal comprehension.  Cognition:    The patient is oriented to person, place, and time;     recent and remote memory intact;     language fluent;     normal attention, concentration,     fund of knowledge Cranial Nerves:    The pupils are equal, round, and reactive to light. The fundi are normal and spontaneous venous pulsations are present. Visual fields are full to finger confrontation. Extraocular movements are intact. Trigeminal sensation is intact and the muscles of mastication are normal. The face is symmetric. The palate elevates in the midline. Hearing intact. Voice is normal. Shoulder shrug is normal. The tongue has normal motion without fasciculations.   Coordination: nml  Gait: nml  Motor Observation:    No asymmetry, no atrophy, and no involuntary movements noted. Tone:    Normal muscle tone.    Posture:    Posture is normal. normal erect    Strength:    Strength is V/V in the upper and lower limbs.      Sensation: intact to LT     Reflex Exam:  DTR's:    Deep tendon reflexes in the upper and lower extremities are normal bilaterally.   Toes:    The toes are downgoing bilaterally.   Clonus:    Clonus is absent.    Assessment/Plan:  chronic migraine headaches and cluster headaches  - quitting smoking - MRI of the brain w/wo contrast: MRI brain due to concerning symptoms of morning headaches, cluster qualities,vision changes, worsening headaches, left eye pain, intractable chronic headaches  to look for space occupying mass such as in the hypothalamus as can be seen in cluster headaches, , strokes, malignancies, vasculidities, demyelination(multiple sclerosis) or other - Start Sumatriptan oral and see if effective, may need to switch to quicker acting medications like injections but will  try to start preventative in the meantime and try imitrex acutelyOther options include nasal sprays, oxygen.  - Preventioncluster: First line verapamil but can;t take that due to hypotension. Topiramate contraindicated due to kidney stones, Lithium contraindicated due to psychiatric disease which may be worsened by the addition of other psychiatric medications such as lithium she has already tried other SSRIs and SNRIs hesiate to add Lithium.  - given migraineous qualities vs cluster headache will diagnose with chronic migraines as well as cluster headaches and prescribe Emgality. For cluster 300mg  a month but will try standard dose of 120mg  monthly for mchronic migraine and see if that alone helps which is easier to get approved(emgality only approved for episodic cluster and if this is cluster it is not approved for chronic cluster so will try migraine dosage). WIll use EMGALITY FOR CHRONI MIGRAINES.   Orders Placed This Encounter  Procedures   MR BRAIN W WO CONTRAST   Meds ordered this encounter  Medications   SUMAtriptan (IMITREX) 100 MG tablet    Sig: Take 1 tablet (100 mg total) by mouth once as needed for up to 1 dose. May repeat in 2 hours if headache persists or recurs.    Dispense:  12 tablet    Refill:  12   Galcanezumab-gnlm (EMGALITY) 120 MG/ML SOAJ    Sig: Inject 120 mg into the skin every 30 (thirty) days.    Dispense:  1.12 mL    Refill:  11    Cc: Anabel Halon, MD,  Anabel Halon, MD  Naomie Dean, MD  [facility] [address] [place], [place] [address]  Phone [phone] Fax [phone]

## 2024-02-11 ENCOUNTER — Encounter (HOSPITAL_COMMUNITY): Payer: Self-pay | Admitting: Hematology

## 2024-02-11 ENCOUNTER — Other Ambulatory Visit (HOSPITAL_COMMUNITY): Payer: Self-pay

## 2024-02-11 ENCOUNTER — Telehealth: Payer: Self-pay

## 2024-02-11 NOTE — Telephone Encounter (Signed)
 Pharmacy Patient Advocate Encounter  Received notification from [facility] that Prior Authorization for Emgality 120MG /ML auto-injectors (migraine) has been APPROVED from 02/11/2024 to 05/11/2024. Ran test claim, Copay is $4.00. This test claim was processed through [facility]- copay amounts may vary at other pharmacies due to pharmacy/plan contracts, or as the patient moves through the different stages of their insurance plan.   PA #/Case ID/Reference #: [id number]

## 2024-02-16 ENCOUNTER — Telehealth (INDEPENDENT_AMBULATORY_CARE_PROVIDER_SITE_OTHER): Payer: Self-pay | Admitting: Internal Medicine

## 2024-02-16 ENCOUNTER — Encounter: Payer: Self-pay | Admitting: Internal Medicine

## 2024-02-16 ENCOUNTER — Ambulatory Visit: Payer: Self-pay

## 2024-02-16 VITALS — Ht 62.0 in | Wt 111.0 lb

## 2024-02-16 DIAGNOSIS — F331 Major depressive disorder, recurrent, moderate: Secondary | ICD-10-CM

## 2024-02-16 DIAGNOSIS — F411 Generalized anxiety disorder: Secondary | ICD-10-CM | POA: Diagnosis not present

## 2024-02-16 MED ORDER — VENLAFAXINE HCL ER 150 MG PO CP24: 150.0000 mg | ORAL_CAPSULE | Freq: Every day | ORAL | 1 refills | Status: DC

## 2024-02-16 NOTE — Telephone Encounter (Signed)
 Pt has virtual visit with provider in regard.

## 2024-02-16 NOTE — Telephone Encounter (Signed)
 This RN made first attempt to triage patient. No answer, unable to leave a message due to full VMB. Routing for additional attempts.   Copied from CRM [id number]. Topic: Clinical - Medication Question >> Feb 16, 2024  9:07 AM Louie Casa B wrote: Reason for CRM: patient has been  taking venlafaxine XR (EFFEXOR-XR) 75 MG 24 hr capsule and it was working for her at first but it is no longer working she needs something else she has question please call patient [phone]

## 2024-02-16 NOTE — Assessment & Plan Note (Signed)
 Has anxiety and easy irritability Increased dose of Effexor to 150 mg once daily Advised to perform simple relaxation techniques

## 2024-02-16 NOTE — Assessment & Plan Note (Signed)
 Uncontrolled, was improving with Effexor 75 mg once daily initially Since she has tolerated Effexor, will increase dose of Effexor to 150 mg once daily - Effexor can have limited benefit with cluster headache and neuropathy as well Has tried Cymbalta, but had persistent nausea

## 2024-02-16 NOTE — Patient Instructions (Signed)
 Please start taking Effexor 150 mg once daily instead of 75 mg.  Please perform relaxation exercises such as deep breathing (Yoga, Tai chi, etc.) for anxiety.

## 2024-02-16 NOTE — Progress Notes (Signed)
 Virtual Visit via Video Note   Because of Donna Munoz's co-morbid illnesses, she is at least at moderate risk for complications without adequate follow up.  This format is felt to be most appropriate for this patient at this time.  All issues noted in this document were discussed and addressed.  A limited physical exam was performed with this format.      Evaluation Performed:  Follow-up visit  Date:  02/16/2024   ID:  Donna Munoz, DOB 29-Jul-1976, MRN [id number]  Patient Location: Home Provider Location: Office/Clinic  Participants: Patient Location of Patient: Home Location of Provider: Telehealth Consent was obtain for visit to be over via telehealth. I verified that I am speaking with the correct person using two identifiers.  PCP:  Anabel Halon, MD   Chief Complaint: Anxiety  History of Present Illness:    Donna Munoz is a 48 y.o. 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  She reports apathy, decreased appetite, decreased concentration and easy irritability for the last 12 months.  She was given Effexor 75 mg once daily in the last month, which had improved her symptoms initially, but has been feeling irritable and anxious for the last 2 weeks. Her sleep has improved now. She has tried amitriptyline in the past, which caused drowsiness.  She was doing better with Cymbalta, but stopped taking it due to persistent nausea. Denies any SI or HI currently.  The patient does not have symptoms concerning for COVID-19 infection (fever, chills, cough, or new shortness of breath).   Past Medical, Surgical, Social History, Allergies, and Medications have been Reviewed.  Past Medical History:  Diagnosis Date   Anemia    Anxiety    Depression    GERD (gastroesophageal reflux disease)    Headache    Shingles    Tobacco abuse 05/06/2020   Past Surgical History:  Procedure  Laterality Date   AMPUTATION TOE Right 03/07/2020   Procedure: RIGHT THIRD TOE AMPUTATION;  Surgeon: Almond Lint, MD;  Location: [facility] OR;  Service: General;  Laterality: Right;   BREAST LUMPECTOMY  age 48   CESAREAN SECTION  01/06/2006   CESAREAN SECTION  10/15/2016   DENTAL SURGERY  2019   INGUINAL HERNIA REPAIR Right 01/30/2020   Procedure: RIGHT INGUINAL HERNIA REPAIR WITH  MESH;  Surgeon: Almond Lint, MD;  Location: [facility];  Service: General;  Laterality: Right;   INSERTION OF MESH Right 01/30/2020   Procedure: INSERTION OF MESH;  Surgeon: Almond Lint, MD;  Location: [facility];  Service: General;  Laterality: Right;   IR CV LINE INJECTION  09/11/2020   IR IMAGING GUIDED PORT INSERTION  09/12/2020   IR REMOVAL TUN ACCESS W/ PORT W/O FL MOD SED  09/12/2020   IRRIGATION AND DEBRIDEMENT ABSCESS Right 03/07/2020   Procedure: Aspiration of Right Groin Seroma;  Surgeon: Almond Lint, MD;  [facility];  Service: General;  Laterality: Right;   MELANOMA EXCISION WITH SENTINEL LYMPH NODE BIOPSY Right 01/30/2020   Procedure: WIDE LOWER EXCISION RIGHT THIRD TOE MELANOMA EXCISION WITH SENTINEL LYMPH NODE BIOPSY;  Surgeon: Almond Lint, MD;  Location: [facility];  Service: General;  Laterality: Right;   PORT-A-CATH REMOVAL N/A 08/12/2022   Procedure: MINOR REMOVAL PORT-A-CATH;  Surgeon: Franky Macho, MD;  Location: [facility];  Service: General;  Laterality: N/A;   PORTACATH  PLACEMENT N/A 03/07/2020   Procedure: INSERTION PORT-A-CATH;  Surgeon: Almond Lint, MD;  Location: [facility] OR;  Service: General;  Laterality: Left     Current Meds  Medication Sig   Galcanezumab-gnlm (EMGALITY) 120 MG/ML SOAJ Inject 120 mg into the skin every 30 (thirty) days.   omeprazole (PRILOSEC) 20 MG capsule Take 1 capsule (20 mg total) by mouth 2 (two) times daily before a meal.   SUMAtriptan (IMITREX) 100 MG tablet Take 1 tablet (100 mg total) by mouth once as needed for up to  1 dose. May repeat in 2 hours if headache persists or recurs.   Vitamin D, Ergocalciferol, (DRISDOL) 1.25 MG (50000 UNIT) CAPS capsule Take 1 capsule (50,000 Units total) by mouth every 7 (seven) days.     Allergies:   Doxycycline, Gadavist [gadobutrol], and Metronidazole   ROS:   Please see the history of present illness. All other systems reviewed and are negative.   Labs/Other Tests and Data Reviewed:    Recent Labs: 11/22/2023: ALT 15; BUN 15; Creatinine, Ser 0.74; Hemoglobin 15.2; Platelets 264; Potassium 3.9; Sodium 133; TSH 3.164   Recent Lipid Panel Lab Results  Component Value Date/Time   CHOL 144 06/17/2021 11:24 AM   CHOL 147 09/02/2020 10:09 AM   TRIG 86 06/17/2021 11:24 AM   HDL 29 (L) 06/17/2021 11:24 AM   HDL 29 (L) 09/02/2020 10:09 AM   CHOLHDL 5.0 06/17/2021 11:24 AM   LDLCALC 98 06/17/2021 11:24 AM   LDLCALC 104 (H) 09/02/2020 10:09 AM    Wt Readings from Last 3 Encounters:  02/16/24 111 lb (50.3 kg)  02/10/24 112 lb (50.8 kg)  12/21/23 110 lb (49.9 kg)     Objective:    Vital Signs:  Ht 5\' 2"  (1.575 m)   Wt 111 lb (50.3 kg)   BMI 20.30 kg/m    VITAL SIGNS:  reviewed GEN:  no acute distress EYES:  sclerae anicteric, EOMI - Extraocular Movements Intact RESPIRATORY:  normal respiratory effort, symmetric expansion NEURO:  alert and oriented x 3, no obvious focal deficit PSYCH:  Anxious.  ASSESSMENT & PLAN:    Moderate episode of recurrent major depressive disorder (HCC) Uncontrolled, was improving with Effexor 75 mg once daily initially Since she has tolerated Effexor, will increase dose of Effexor to 150 mg once daily - Effexor can have limited benefit with cluster headache and neuropathy as well Has tried Cymbalta, but had persistent nausea  GAD (generalized anxiety disorder) Has anxiety and easy irritability Increased dose of Effexor to 150 mg once daily Advised to perform simple relaxation techniques    I discussed the assessment and  treatment plan with the patient. The patient was provided an opportunity to ask questions, and all were answered. The patient agreed with the plan and demonstrated an understanding of the instructions.   The patient was advised to call back or seek an in-person evaluation if the symptoms worsen or if the condition fails to improve as anticipated.  The above assessment and management plan was discussed with the patient. The patient verbalized understanding of and has agreed to the management plan.   Medication Adjustments/Labs and Tests Ordered: Current medicines are reviewed at length with the patient today.  Concerns regarding medicines are outlined above.   Tests Ordered: No orders of the defined types were placed in this encounter.   Medication Changes: No orders of the defined types were placed in this encounter.    Note: This dictation was prepared with Dragon dictation along with  smaller phrase technology. Similar sounding words can be transcribed inadequately or may not be corrected upon review. Any transcriptional errors that result from this process are unintentional.      Disposition:  Follow up  Signed, Anabel Halon, MD  02/16/2024 10:50 AM     [facility]
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
## 2024-02-22 ENCOUNTER — Telehealth: Payer: Self-pay | Admitting: Neurology

## 2024-02-22 NOTE — Telephone Encounter (Signed)
 [facility] [place]: [phone] exp. 02/22/24-04/21/24 sent to GI [phone]

## 2024-03-06 ENCOUNTER — Other Ambulatory Visit: Payer: Self-pay | Admitting: Internal Medicine

## 2024-03-06 DIAGNOSIS — E559 Vitamin D deficiency, unspecified: Secondary | ICD-10-CM

## 2024-03-13 ENCOUNTER — Ambulatory Visit
Admission: RE | Admit: 2024-03-13 | Discharge: 2024-03-13 | Disposition: A | Discharge status: Home / Self Care | Source: Ambulatory Visit | Attending: Neurology

## 2024-03-13 DIAGNOSIS — Z85820 Personal history of malignant melanoma of skin: Secondary | ICD-10-CM

## 2024-03-13 DIAGNOSIS — G44021 Chronic cluster headache, intractable: Secondary | ICD-10-CM

## 2024-03-13 DIAGNOSIS — G43709 Chronic migraine without aura, not intractable, without status migrainosus: Secondary | ICD-10-CM | POA: Diagnosis not present

## 2024-03-13 DIAGNOSIS — R51 Headache with orthostatic component, not elsewhere classified: Secondary | ICD-10-CM

## 2024-03-13 DIAGNOSIS — H538 Other visual disturbances: Secondary | ICD-10-CM | POA: Diagnosis not present

## 2024-03-13 DIAGNOSIS — R519 Headache, unspecified: Secondary | ICD-10-CM

## 2024-03-13 DIAGNOSIS — H539 Unspecified visual disturbance: Secondary | ICD-10-CM | POA: Diagnosis not present

## 2024-03-13 MED ORDER — GADOPICLENOL 0.5 MMOL/ML IV SOLN
5.0000 mL | Freq: Once | INTRAVENOUS | Status: AC | PRN
  Administered 2024-03-13: 5 mL via INTRAVENOUS

## 2024-03-15 ENCOUNTER — Encounter: Payer: Self-pay | Admitting: Neurology

## 2024-04-14 ENCOUNTER — Other Ambulatory Visit (HOSPITAL_COMMUNITY): Payer: Self-pay

## 2024-04-14 ENCOUNTER — Telehealth: Payer: Self-pay

## 2024-04-14 NOTE — Telephone Encounter (Signed)
 Pharmacy Patient Advocate Encounter   Received notification from CoverMyMeds that prior authorization for Emgality  120MG /ML auto-injectors (migraine) is required/requested.   Insurance verification completed.   The patient is insured through [facility] .   Per test claim: Refill too soon. PA is not needed at this time. Medication was filled 04/07/2024. Next eligible fill date is 04/30/2024.

## 2024-04-16 ENCOUNTER — Other Ambulatory Visit: Payer: Self-pay | Admitting: Gastroenterology

## 2024-05-01 ENCOUNTER — Telehealth: Payer: Self-pay | Admitting: Pharmacist

## 2024-05-01 NOTE — Telephone Encounter (Signed)
 Pharmacy Patient Advocate Encounter   Received notification from CoverMyMeds that prior authorization for Emgality  120MG /ML auto-injectors (migraine) is required/requested.   Insurance verification completed.   The patient is insured through [facility] .   Per test claim: PA required; PA submitted to above mentioned insurance via CoverMyMeds Key/confirmation #/EOC [id number] Status is pending

## 2024-05-01 NOTE — Telephone Encounter (Signed)
 Pharmacy Patient Advocate Encounter  Received notification from [facility] that Prior Authorization for Emgality  120MG /ML auto-injectors (migraine) has been APPROVED from 05/01/2024 to 05/01/2025   PA #/Case ID/Reference #: [id number]

## 2024-05-15 ENCOUNTER — Encounter: Payer: Self-pay | Admitting: Internal Medicine

## 2024-05-15 ENCOUNTER — Encounter (HOSPITAL_COMMUNITY): Payer: Self-pay | Admitting: Hematology

## 2024-05-15 ENCOUNTER — Other Ambulatory Visit (HOSPITAL_COMMUNITY): Payer: Self-pay

## 2024-05-15 ENCOUNTER — Ambulatory Visit: Admitting: Internal Medicine

## 2024-05-15 VITALS — BP 98/67 | HR 88 | Ht 62.0 in | Wt 112.4 lb

## 2024-05-15 DIAGNOSIS — G629 Polyneuropathy, unspecified: Secondary | ICD-10-CM

## 2024-05-15 DIAGNOSIS — N921 Excessive and frequent menstruation with irregular cycle: Secondary | ICD-10-CM | POA: Diagnosis not present

## 2024-05-15 DIAGNOSIS — K2970 Gastritis, unspecified, without bleeding: Secondary | ICD-10-CM

## 2024-05-15 DIAGNOSIS — K2100 Gastro-esophageal reflux disease with esophagitis, without bleeding: Secondary | ICD-10-CM

## 2024-05-15 DIAGNOSIS — Z720 Tobacco use: Secondary | ICD-10-CM

## 2024-05-15 DIAGNOSIS — F411 Generalized anxiety disorder: Secondary | ICD-10-CM | POA: Diagnosis not present

## 2024-05-15 DIAGNOSIS — B9681 Helicobacter pylori [H. pylori] as the cause of diseases classified elsewhere: Secondary | ICD-10-CM

## 2024-05-15 DIAGNOSIS — G43709 Chronic migraine without aura, not intractable, without status migrainosus: Secondary | ICD-10-CM

## 2024-05-15 DIAGNOSIS — F331 Major depressive disorder, recurrent, moderate: Secondary | ICD-10-CM

## 2024-05-15 MED ORDER — CARIPRAZINE HCL 1.5 MG PO CAPS: 1.5000 mg | ORAL_CAPSULE | Freq: Every day | ORAL | 2 refills | Status: DC

## 2024-05-15 NOTE — Assessment & Plan Note (Signed)
Smokes about 0.5 pack/day  Asked about quitting: confirms that he/she currently smokes cigarettes Advise to quit smoking: Educated about QUITTING to reduce the risk of cancer, cardio and cerebrovascular disease. Assess willingness: Unwilling to quit at this time, but is working on cutting back. Assist with counseling and pharmacotherapy: Counseled for 5 minutes and literature provided. Arrange for follow up: follow up in 3 months and continue to offer help. 

## 2024-05-15 NOTE — Assessment & Plan Note (Signed)
 Had EGD -showed H Pylori, completed treatment Check H Pylori stool antigen test Follow up with GI

## 2024-05-15 NOTE — Assessment & Plan Note (Signed)
 Has anxiety and easy irritability Increased dose of Effexor  to 150 mg once daily in the last visit, now better Advised to perform simple relaxation techniques

## 2024-05-15 NOTE — Assessment & Plan Note (Signed)
 Chronic numbness of the RLE > LLE and LUE Likely due to polyneuropathy DPA pulse intact Had prescribed Cymbalta  30 mg QD for neuropathy and MDD - but c/o nausea, unclear if related to H Pylori, but she preferred to change treatment, started Effexor  later If persistent, can switch to Lyrica

## 2024-05-15 NOTE — Progress Notes (Signed)
 Established Patient Office Visit  Subjective:  Patient ID: Donna Munoz, female    DOB: [birth year]  Age: 48 y.o. MRN: [id number]  CC:  Chief Complaint  Patient presents with   Medical Management of Chronic Issues    4 Month f/u    Irregular menstrual cycle    thinks it is menopause.    HPI Donna Munoz is a 48 y.o. 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  She completed immunotherapy with pembrolizumab  in 2022.  She is followed by oncology currently.   She reports apathy, insomnia, decreased appetite, decreased concentration and easy irritability. She was given Effexor  150 mg once daily in the last visit, which had improved her symptoms initially, but has been feeling irritable and anxious for the last 4 weeks. Her sleep has improved now. She has tried amitriptyline in the past, which caused drowsiness.  She was doing better with Cymbalta , but stopped taking it due to persistent nausea. Denies any SI or HI currently.  She also reports chronic nausea especially after eating.  She has chronic epigastric discomfort.  Her recent CT abdomen showed signs of esophagitis. She takes omeprazole  for it with mild relief. She was recently evaluated by general surgery for hiatal hernia and was later referred to GI.  Denies any vomiting, melena or hematochezia.  She takes about 2 cans of Mountain Dew or Sprite almost daily.  Peripheral neuropathy: She has chronic numbness of the right LE > left LE.  She has tried gabapentin , but was causing mild drowsiness and stopped taking gabapentin  now.  She also reports chronic, intermittent low back pain, worse with standing.  Denies any recent injury or fall.  She was given Cymbalta  previously, but had nausea with it.  She reports irregular menstrual cycles for the last 2  months, has had vaginal bleeding every 2 to 3 weeks.  She has history of heavy menstrual bleeding with cramps in the past.  Denies vaginal discharge currently.   Past Medical History:  Diagnosis Date   Anemia    Anxiety    Depression    GERD (gastroesophageal reflux disease)    Headache    Shingles    Tobacco abuse 05/06/2020    Past Surgical History:  Procedure Laterality Date   AMPUTATION TOE Right 03/07/2020   Procedure: RIGHT THIRD TOE AMPUTATION;  Surgeon: Aron Shoulders, MD;  Location: [facility] OR;  Service: General;  Laterality: Right;   BREAST LUMPECTOMY  age 78   CESAREAN SECTION  01/06/2006   CESAREAN SECTION  10/15/2016   DENTAL SURGERY  2019   INGUINAL HERNIA REPAIR Right 01/30/2020   Procedure: RIGHT INGUINAL HERNIA REPAIR WITH  MESH;  Surgeon: Aron Shoulders, MD;  Location: [facility];  Service: General;  Laterality: Right;   INSERTION OF MESH Right 01/30/2020   Procedure: INSERTION OF MESH;  Surgeon: Aron Shoulders, MD;  Location: [facility];  Service: General;  Laterality: Right;   IR CV LINE INJECTION  09/11/2020   IR IMAGING GUIDED PORT INSERTION  09/12/2020   IR REMOVAL TUN ACCESS W/ PORT W/O FL MOD SED  09/12/2020   IRRIGATION AND DEBRIDEMENT ABSCESS Right 03/07/2020   Procedure: Aspiration of Right Groin Seroma;  Surgeon: Aron Shoulders, MD;  Location: [facility] OR;  Service: General;  Laterality: Right;   MELANOMA  EXCISION WITH SENTINEL LYMPH NODE BIOPSY Right 01/30/2020   Procedure: WIDE LOWER EXCISION RIGHT THIRD TOE MELANOMA EXCISION WITH SENTINEL LYMPH NODE BIOPSY;  Surgeon: Aron Shoulders, MD;  Location: [facility];  Service: General;  Laterality: Right;   PORT-A-CATH REMOVAL N/A 08/12/2022   Procedure: MINOR REMOVAL PORT-A-CATH;  Surgeon: Mavis Anes, MD;  Location: [facility];  Service: General;  Laterality: N/A;   PORTACATH PLACEMENT N/A 03/07/2020   Procedure: INSERTION PORT-A-CATH;  Surgeon: Aron Shoulders, MD;  Location: [facility] OR;   Service: General;  Laterality: Left    Family History  Problem Relation Age of Onset   Heart disease Mother    Cancer Mother    Ovarian cysts Sister    Healthy Sister    Healthy Sister    Healthy Brother    Cancer Maternal Grandmother    Heart attack Maternal Grandmother    Cancer Maternal Grandfather    Cancer Paternal Grandmother    Colon cancer Paternal Grandfather    Other Paternal Grandfather        brain tumor in his 36s. had metal plate inserted   Psoriasis Daughter    Healthy Son    Esophageal cancer Neg Hx    Rectal cancer Neg Hx    Stomach cancer Neg Hx    Migraines Neg Hx     Social History   Socioeconomic History   Marital status: Legally Separated    Spouse name: Not on file   Number of children: 2   Years of education: Not on file   Highest education level: Not on file  Occupational History   Occupation: unemployed d/t COVID  Tobacco Use   Smoking status: Every Day    Current packs/day: 1.00    Average packs/day: 1 pack/day for 29.0 years (29.0 ttl pk-yrs)    Types: Cigarettes   Smokeless tobacco: Never  Vaping Use   Vaping status: Never Used  Substance and Sexual Activity   Alcohol use: No   Drug use: Yes    Frequency: 2.0 times per week    Types: Marijuana    Comment: hardly ever, maybe 3x a month now   Sexual activity: Yes  Other Topics Concern   Not on file  Social History Narrative   Right handed   Caffeine: 6 pack mtn dew cans per day   Lives with others   Social Drivers of Health   Financial Resource Strain: High Risk (12/08/2019)   Overall Financial Resource Strain (CARDIA)    Difficulty of Paying Living Expenses: Hard  Food Insecurity: No Food Insecurity (12/08/2019)   Hunger Vital Sign    Worried About Running Out of Food in the Last Year: Never true    Ran Out of Food in the Last Year: Never true  Transportation Needs: No Transportation Needs (12/26/2020)   PRAPARE - Administrator, Civil Service (Medical): No     Lack of Transportation (Non-Medical): No  Physical Activity: Inactive (12/26/2020)   Exercise Vital Sign    Days of Exercise per Week: 0 days    Minutes of Exercise per Session: 0 min  Stress: Stress Concern Present (12/08/2019)   [employer] of Occupational Health - Occupational Stress Questionnaire    Feeling of Stress : Very much  Social Connections: Unknown (06/26/2023)   Received from [facility]   Social Network    Social Network: Not on file  Intimate Partner Violence: Not At Risk (06/26/2023)   Received from [facility]   HITS  Over the last 12 months how often did your partner physically hurt you?: Never    Over the last 12 months how often did your partner insult you or talk down to you?: Never    Over the last 12 months how often did your partner threaten you with physical harm?: Never    Over the last 12 months how often did your partner scream or curse at you?: Never    Outpatient Medications Prior to Visit  Medication Sig Dispense Refill   Galcanezumab -gnlm (EMGALITY ) 120 MG/ML SOAJ Inject 120 mg into the skin every 30 (thirty) days. 1.12 mL 11   omeprazole  (PRILOSEC) 40 MG capsule TAKE 1 CAPSULE (40 MG TOTAL) BY MOUTH DAILY. 90 capsule 1   SUMAtriptan  (IMITREX ) 100 MG tablet Take 1 tablet (100 mg total) by mouth once as needed for up to 1 dose. May repeat in 2 hours if headache persists or recurs. 12 tablet 12   venlafaxine  XR (EFFEXOR -XR) 150 MG 24 hr capsule Take 1 capsule (150 mg total) by mouth daily with breakfast. 90 capsule 1   Vitamin D , Ergocalciferol , (DRISDOL ) 1.25 MG (50000 UNIT) CAPS capsule TAKE 1 CAPSULE (50,000 UNITS TOTAL) BY MOUTH EVERY 7 (SEVEN) DAYS 12 capsule 1   No facility-administered medications prior to visit.    Allergies  Allergen Reactions   Doxycycline  Rash    Patient developed diffuse pruritic rash involving her trunk 2 days after being given metronidazole  and doxycycline  to treat an H. pylori infection.  No other symptoms  besides the rash.   Gadavist  [Gadobutrol ] Nausea Only    Mild nausea but no vomiting after injection.  Ok after 5 minutes.   Metronidazole  Rash    Patient developed diffuse pruritic rash involving her trunk 2 days after being given metronidazole  and doxycycline  to treat an H. pylori infection.  No other symptoms besides the rash.    ROS Review of Systems  Constitutional:  Positive for fatigue. Negative for chills and fever.  HENT:  Negative for congestion, sinus pressure, sinus pain and sore throat.   Eyes:  Negative for pain and discharge.  Respiratory:  Negative for cough and shortness of breath.   Cardiovascular:  Negative for chest pain and palpitations.  Gastrointestinal:  Positive for abdominal pain (Mild, epigastric) and nausea. Negative for diarrhea and vomiting.  Endocrine: Negative for polydipsia and polyuria.  Genitourinary:  Negative for dysuria and hematuria.  Musculoskeletal:  Negative for neck pain and neck stiffness.  Skin:  Negative for rash.  Neurological:  Positive for numbness (B/l LE and UE). Negative for dizziness and weakness.  Psychiatric/Behavioral:  Positive for decreased concentration, dysphoric mood and sleep disturbance. Negative for agitation and behavioral problems. The patient is nervous/anxious.       Objective:    Physical Exam Vitals reviewed.  Constitutional:      General: She is not in acute distress.    Appearance: She is not diaphoretic.  HENT:     Head: Normocephalic and atraumatic.     Nose: Nose normal.     Mouth/Throat:     Mouth: Mucous membranes are moist.  Eyes:     General: No scleral icterus.    Extraocular Movements: Extraocular movements intact.  Cardiovascular:     Rate and Rhythm: Normal rate and regular rhythm.     Heart sounds: Normal heart sounds. No murmur heard. Pulmonary:     Breath sounds: Normal breath sounds. No wheezing or rales.  Abdominal:     Palpations: Abdomen is soft.  Tenderness: There is abdominal  tenderness (Mild, epigastric).  Musculoskeletal:     Cervical back: Neck supple. No tenderness.     Right lower leg: No edema.     Left lower leg: No edema.  Skin:    General: Skin is warm.     Findings: No rash.  Neurological:     General: No focal deficit present.     Mental Status: She is alert and oriented to person, place, and time.     Sensory: Sensory deficit (RLE, from midshin to toes) present.     Motor: No weakness.  Psychiatric:        Mood and Affect: Mood normal.        Behavior: Behavior normal.     BP 98/67   Pulse 88   Ht 5' 2 (1.575 m)   Wt 112 lb 6.4 oz (51 kg)   SpO2 96%   BMI 20.56 kg/m  Wt Readings from Last 3 Encounters:  05/15/24 112 lb 6.4 oz (51 kg)  02/16/24 111 lb (50.3 kg)  02/10/24 112 lb (50.8 kg)    Lab Results  Component Value Date   TSH 3.164 11/22/2023   Lab Results  Component Value Date   WBC 5.1 11/22/2023   HGB 15.2 (H) 11/22/2023   HCT 45.2 11/22/2023   MCV 95.0 11/22/2023   PLT 264 11/22/2023   Lab Results  Component Value Date   NA 133 (L) 11/22/2023   K 3.9 11/22/2023   CO2 22 11/22/2023   GLUCOSE 129 (H) 11/22/2023   BUN 15 11/22/2023   CREATININE 0.74 11/22/2023   BILITOT 0.5 11/22/2023   ALKPHOS 61 11/22/2023   AST 19 11/22/2023   ALT 15 11/22/2023   PROT 7.8 11/22/2023   ALBUMIN 4.5 11/22/2023   CALCIUM 10.1 11/22/2023   ANIONGAP 7 11/22/2023   EGFR 100 10/18/2023   Lab Results  Component Value Date   CHOL 144 06/17/2021   Lab Results  Component Value Date   HDL 29 (L) 06/17/2021   Lab Results  Component Value Date   LDLCALC 98 06/17/2021   Lab Results  Component Value Date   TRIG 86 06/17/2021   Lab Results  Component Value Date   CHOLHDL 5.0 06/17/2021   Lab Results  Component Value Date   HGBA1C 5.2 09/13/2023      Assessment & Plan:   Problem List Items Addressed This Visit       Cardiovascular and Mediastinum   Chronic migraine without aura without status migrainosus, not  intractable   On Emgality  Followed by Neurology        Digestive   Gastroesophageal reflux disease with esophagitis without hemorrhage   Her epigastric pain and nausea is likely due to GERD with esophagitis Had EGD -showed H Pylori, completed treatment Continue omeprazole  40 mg BID Follow up with GI Avoid hot and spicy food Needs to cut down soft drink intake and quit smoking      Helicobacter pylori gastritis   Had EGD -showed H Pylori, completed treatment Check H Pylori stool antigen test Follow up with GI      Relevant Orders   H. pylori antigen, stool     Nervous and Auditory   Peripheral polyneuropathy   Chronic numbness of the RLE > LLE and LUE Likely due to polyneuropathy DPA pulse intact Had prescribed Cymbalta  30 mg QD for neuropathy and MDD - but c/o nausea, unclear if related to H Pylori, but she preferred to change treatment,  started Effexor  later If persistent, can switch to Lyrica      Relevant Medications   cariprazine  (VRAYLAR ) 1.5 MG capsule     Other   Tobacco abuse   Smokes about 0.5 pack/day  Asked about quitting: confirms that he/she currently smokes cigarettes Advise to quit smoking: Educated about QUITTING to reduce the risk of cancer, cardio and cerebrovascular disease. Assess willingness: Unwilling to quit at this time, but is working on cutting back. Assist with counseling and pharmacotherapy: Counseled for 5 minutes and literature provided. Arrange for follow up: follow up in 3 months and continue to offer help.      Moderate episode of recurrent major depressive disorder (HCC)   Uncontrolled, was improving with Effexor  150 mg once daily initially Effexor  can have limited benefit with cluster headache and neuropathy as well Added Vraylar  1.5 mg for resistant depression Has tried Cymbalta  and Elavil, but did not tolerate them Has tried SSRI in the past without much benefit      Relevant Medications   cariprazine  (VRAYLAR ) 1.5 MG  capsule   GAD (generalized anxiety disorder) - Primary   Has anxiety and easy irritability Increased dose of Effexor  to 150 mg once daily in the last visit, now better Advised to perform simple relaxation techniques      Menorrhagia with irregular cycle   Referred to OB/GYN for further evaluation      Relevant Orders   Ambulatory referral to Obstetrics / Gynecology     Meds ordered this encounter  Medications   cariprazine  (VRAYLAR ) 1.5 MG capsule    Sig: Take 1 capsule (1.5 mg total) by mouth daily.    Dispense:  30 capsule    Refill:  2    Follow-up: Return in about 2 months (around 07/16/2024) for MDD.    Suzzane MARLA Blanch, MD

## 2024-05-15 NOTE — Assessment & Plan Note (Signed)
 On Emgality  Followed by Neurology

## 2024-05-15 NOTE — Patient Instructions (Addendum)
 Please start taking Vraylar  in addition to Venlafaxine .  Please continue to take medications as prescribed.  Please continue to follow low salt diet and perform moderate exercise/walking as tolerated.  Please try to cut down -> quit smoking.

## 2024-05-15 NOTE — Assessment & Plan Note (Signed)
Referred to OBGYN for further evaluation

## 2024-05-15 NOTE — Assessment & Plan Note (Signed)
 Her epigastric pain and nausea is likely due to GERD with esophagitis Had EGD -showed H Pylori, completed treatment Continue omeprazole  40 mg BID Follow up with GI Avoid hot and spicy food Needs to cut down soft drink intake and quit smoking

## 2024-05-15 NOTE — Assessment & Plan Note (Addendum)
 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

## 2024-05-16 ENCOUNTER — Telehealth (HOSPITAL_COMMUNITY): Payer: Self-pay | Admitting: Pharmacy Technician

## 2024-05-16 ENCOUNTER — Other Ambulatory Visit (HOSPITAL_COMMUNITY): Payer: Self-pay

## 2024-05-16 NOTE — Telephone Encounter (Signed)
 Pharmacy Patient Advocate Encounter  Received notification from [facility] that Prior Authorization for Vraylar  1.5mg  capsules has been APPROVED from 05/16/2024 to 05/16/2025. Ran test claim, Copay is $4.00. This test claim was processed through [facility]- copay amounts may vary at other pharmacies due to pharmacy/plan contracts, or as the patient moves through the different stages of their insurance plan.   PA #/Case ID/Reference #: [id number]

## 2024-05-16 NOTE — Telephone Encounter (Signed)
 Pharmacy Patient Advocate Encounter   Received notification from CoverMyMeds that prior authorization for Vraylar  1.5mg  capsule is required/requested.   Insurance verification completed.   The patient is insured through [facility] .   Per test claim: PA required; PA submitted to above mentioned insurance via Latent Key/confirmation #/EOC [id number] Status is pending

## 2024-05-17 ENCOUNTER — Other Ambulatory Visit (HOSPITAL_COMMUNITY): Payer: Self-pay

## 2024-05-29 ENCOUNTER — Inpatient Hospital Stay: Payer: Medicaid Other | Attending: Hematology

## 2024-05-29 ENCOUNTER — Ambulatory Visit (HOSPITAL_COMMUNITY)
Admission: RE | Admit: 2024-05-29 | Discharge: 2024-05-29 | Disposition: A | Discharge status: Home / Self Care | Payer: Medicaid Other | Source: Ambulatory Visit | Attending: Hematology | Admitting: Hematology

## 2024-05-29 DIAGNOSIS — D649 Anemia, unspecified: Secondary | ICD-10-CM | POA: Insufficient documentation

## 2024-05-29 DIAGNOSIS — F17210 Nicotine dependence, cigarettes, uncomplicated: Secondary | ICD-10-CM | POA: Insufficient documentation

## 2024-05-29 DIAGNOSIS — Z79899 Other long term (current) drug therapy: Secondary | ICD-10-CM | POA: Insufficient documentation

## 2024-05-29 DIAGNOSIS — D509 Iron deficiency anemia, unspecified: Secondary | ICD-10-CM

## 2024-05-29 DIAGNOSIS — N83202 Unspecified ovarian cyst, left side: Secondary | ICD-10-CM | POA: Insufficient documentation

## 2024-05-29 DIAGNOSIS — E039 Hypothyroidism, unspecified: Secondary | ICD-10-CM | POA: Diagnosis not present

## 2024-05-29 DIAGNOSIS — C4371 Malignant melanoma of right lower limb, including hip: Secondary | ICD-10-CM | POA: Insufficient documentation

## 2024-05-29 DIAGNOSIS — E032 Hypothyroidism due to medicaments and other exogenous substances: Secondary | ICD-10-CM

## 2024-05-29 DIAGNOSIS — Z85820 Personal history of malignant melanoma of skin: Secondary | ICD-10-CM | POA: Diagnosis not present

## 2024-05-29 DIAGNOSIS — R935 Abnormal findings on diagnostic imaging of other abdominal regions, including retroperitoneum: Secondary | ICD-10-CM | POA: Diagnosis not present

## 2024-05-29 DIAGNOSIS — K5710 Diverticulosis of small intestine without perforation or abscess without bleeding: Secondary | ICD-10-CM | POA: Diagnosis not present

## 2024-05-29 DIAGNOSIS — R918 Other nonspecific abnormal finding of lung field: Secondary | ICD-10-CM | POA: Diagnosis not present

## 2024-05-29 LAB — IRON AND TIBC
Iron: 37 ug/dL (ref 28–170)
Saturation Ratios: 8 % — ABNORMAL LOW (ref 10.4–31.8)
TIBC: 465 ug/dL — ABNORMAL HIGH (ref 250–450)
UIBC: 428 ug/dL

## 2024-05-29 LAB — CBC WITH DIFFERENTIAL/PLATELET
Abs Immature Granulocytes: 0.01 K/uL (ref 0.00–0.07)
Basophils Absolute: 0.1 K/uL (ref 0.0–0.1)
Basophils Relative: 1 %
Eosinophils Absolute: 0.1 K/uL (ref 0.0–0.5)
Eosinophils Relative: 3 %
HCT: 41 % (ref 36.0–46.0)
Hemoglobin: 13.7 g/dL (ref 12.0–15.0)
Immature Granulocytes: 0 %
Lymphocytes Relative: 37 %
Lymphs Abs: 1.7 K/uL (ref 0.7–4.0)
MCH: 31.4 pg (ref 26.0–34.0)
MCHC: 33.4 g/dL (ref 30.0–36.0)
MCV: 93.8 fL (ref 80.0–100.0)
Monocytes Absolute: 0.4 K/uL (ref 0.1–1.0)
Monocytes Relative: 8 %
Neutro Abs: 2.4 K/uL (ref 1.7–7.7)
Neutrophils Relative %: 51 %
Platelets: 265 K/uL (ref 150–400)
RBC: 4.37 MIL/uL (ref 3.87–5.11)
RDW: 13.2 % (ref 11.5–15.5)
WBC: 4.6 K/uL (ref 4.0–10.5)
nRBC: 0 % (ref 0.0–0.2)

## 2024-05-29 LAB — COMPREHENSIVE METABOLIC PANEL WITH GFR
ALT: 16 U/L (ref 0–44)
AST: 24 U/L (ref 15–41)
Albumin: 3.9 g/dL (ref 3.5–5.0)
Alkaline Phosphatase: 77 U/L (ref 38–126)
Anion gap: 8 (ref 5–15)
BUN: 9 mg/dL (ref 6–20)
CO2: 24 mmol/L (ref 22–32)
Calcium: 9.5 mg/dL (ref 8.9–10.3)
Chloride: 104 mmol/L (ref 98–111)
Creatinine, Ser: 0.76 mg/dL (ref 0.44–1.00)
GFR, Estimated: >60 mL/min (ref >60)
Glucose, Bld: 119 mg/dL — ABNORMAL HIGH (ref 70–99)
Potassium: 3.9 mmol/L (ref 3.5–5.1)
Sodium: 136 mmol/L (ref 135–145)
Total Bilirubin: 0.6 mg/dL (ref 0.0–1.2)
Total Protein: 7.5 g/dL (ref 6.5–8.1)

## 2024-05-29 LAB — FERRITIN: Ferritin: 7 ng/mL — ABNORMAL LOW (ref 11–307)

## 2024-05-29 LAB — LACTATE DEHYDROGENASE: LDH: 112 U/L (ref 98–192)

## 2024-05-29 LAB — TSH: TSH: 3.605 u[IU]/mL (ref 0.350–4.500)

## 2024-05-29 MED ORDER — IOHEXOL 300 MG/ML  SOLN
100.0000 mL | Freq: Once | INTRAMUSCULAR | Status: AC | PRN
  Administered 2024-05-29: 100 mL via INTRAVENOUS

## 2024-06-05 ENCOUNTER — Inpatient Hospital Stay: Payer: Medicaid Other | Admitting: Hematology

## 2024-06-05 NOTE — Progress Notes (Incomplete)
 [facility] [address], [place] [address]    Clinic Day:  06/05/2024  Referring physician: Tobie Suzzane POUR, MD  Patient Care Team: Tobie Suzzane POUR, MD as PCP - General (Internal Medicine) Okey Vina GAILS, MD as PCP - Cardiology (Cardiology)   ASSESSMENT & PLAN:   Assessment: 1.  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 -She underwent right third toe amputation at PIP level on 03/07/2020 for positive margins.  Pathology did not show any residual melanoma. -CT chest and abdomen on 03/21/2020 did not show any evidence of metastatic disease. - Adjuvant pembrolizumab  from 03/25/2020 through 04/02/2021   2.  Family history: -Mother died of throat cancer.  Maternal grandmother had breast cancer.  Maternal aunt had pancreatic cancer.  Maternal grandfather had bone cancer.  Maternal great grandmother died of melanoma. -She will benefit from genetic testing.   3.  Right thyroid  nodule: -PET scan on 12/20/2019 showed uptake in the right thyroid . -Ultrasound on 02/05/2020 did not show any suspicious nodules. -FNA on 05/01/2020 showed benign follicular nodule, Bethesda category 2.    Plan: 1.  Stage IIIb (T2B N2A) malignant melanoma of the right third toe, BRAF V6 100 E-: - She does not have any B symptoms or new onset pains. - No signs or symptoms of recurrence. - Reviewed labs from 11/22/2023: Normal LFTs and LDH.  CBC grossly normal. - Physical exam today: No palpable adenopathy or organomegaly. - Recommend follow-up in 6 months with repeat labs and CT CAP.  Continue with dermatology visits once a year.   2.  Normocytic anemia: - Last Feraheme  was on 11/12/2022.  Ferritin is 31 and hemoglobin normal at 15.2.   3.  Hypothyroidism: - TSH is normal at 3.1.  She is no longer on  Synthroid ..    No orders of the defined types were placed in this encounter.     LILLETTE Verneta SAUNDERS Teague,acting as a Neurosurgeon for Alean Stands, MD.,have documented all relevant documentation on the behalf of Alean Stands, MD,as directed by  Alean Stands, MD while in the presence of Alean Stands, MD.  ***   [place] R Teague   7/28/202510:22 AM  CHIEF COMPLAINT:   Diagnosis: malignant melanoma    Cancer Staging  Melanoma of skin [facility]) Staging form: Melanoma of the Skin, AJCC 8th Edition - Clinical stage from 02/08/2020: Stage III (cT2b, cN2a, cM0) - Unsigned    Prior Therapy: Adjuvant Keytruda   Current Therapy: Observation   HISTORY OF PRESENT ILLNESS:   Oncology History  Melanoma of skin (HCC)  12/11/2019 Initial Diagnosis   Melanoma of skin (HCC)   02/19/2020 Genetic Testing   BRAF Mutation Analysis     03/25/2020 - 04/02/2021 Chemotherapy   Patient is on Treatment Plan : MELANOMA Pembrolizumab  q21d        INTERVAL HISTORY:   Donna Munoz is a 48 y.o. female presenting to clinic today for follow up of malignant melanoma. She was last seen by me on 11/29/2023.  Since her last visit, she underwent CT CAP on 05/29/2024 that found: No evidence of metastatic disease within the chest, abdomen, or pelvis. Homogeneous hypodense 4.7 cm left ovarian cyst. Symmetric distal esophageal wall thickening, which can be seen in the setting of esophagitis.  MRI brain done on 03/13/2024 found: Scattered T2/FLAIR hyperintense foci in the  cerebral hemispheres, predominantly in the subcortical white matter.  This is a nonspecific finding and most likely represents either the sequela of migraine headache or mild chronic microvascular ischemic changes.  None of the foci enhance or appear to be acute.  No new lesions compared to the MRI from 04/24/2022. Normal enhancement pattern.  No acute findings.  Nayeli had colonoscopy/EGD on 12/21/2023. Colonoscopy found two 3-6 mm polyps in the  descending colon. EGD found suspected diverticulum in the lower third of the esophagus, a single gastric polyp, Hill Grade IV gastroesophageal flap valve, and duodenal diverticulum. 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   Today, she states that she is doing well overall. Her appetite level is at ***%. Her energy level is at ***%.  PAST MEDICAL HISTORY:   Past Medical History: Past Medical History:  Diagnosis Date   Anemia    Anxiety    Depression    GERD (gastroesophageal reflux disease)    Headache    Shingles    Tobacco abuse 05/06/2020    Surgical History: Past Surgical History:  Procedure Laterality Date   AMPUTATION TOE Right 03/07/2020   Procedure: RIGHT THIRD TOE AMPUTATION;  Surgeon: Aron Shoulders, MD;  Location: [facility] OR;  Service: General;  Laterality: Right;   BREAST LUMPECTOMY  age 72   CESAREAN SECTION  01/06/2006   CESAREAN SECTION  10/15/2016   DENTAL SURGERY  2019   INGUINAL HERNIA REPAIR Right 01/30/2020   Procedure: RIGHT INGUINAL HERNIA REPAIR WITH  MESH;  Surgeon: Aron Shoulders, MD;  Location: [facility];  Service: General;  Laterality: Right;   INSERTION OF MESH Right 01/30/2020   Procedure: INSERTION OF MESH;  Surgeon: Aron Shoulders, MD;  Location: [facility];  Service: General;  Laterality: Right;   IR CV LINE INJECTION  09/11/2020   IR IMAGING GUIDED PORT INSERTION  09/12/2020   IR REMOVAL TUN ACCESS W/ PORT W/O FL MOD SED  09/12/2020   IRRIGATION AND DEBRIDEMENT ABSCESS Right 03/07/2020   Procedure: Aspiration of Right Groin Seroma;  Surgeon: Aron Shoulders, MD;  Location[facility];  Service: General;  Laterality: Right;   MELANOMA EXCISION WITH SENTINEL LYMPH NODE BIOPSY Right 01/30/2020   Procedure: WIDE LOWER EXCISION RIGHT THIRD TOE MELANOMA EXCISION WITH SENTINEL LYMPH NODE BIOPSY;   Surgeon: Aron Shoulders, MD;  Location: [facility];  Service: General;  Laterality: Right;   PORT-A-CATH REMOVAL N/A 08/12/2022   Procedure: MINOR REMOVAL PORT-A-CATH;  Surgeon: Mavis Anes, MD;  Location: [facility];  Service: General;  Laterality: N/A;   PORTACATH PLACEMENT N/A 03/07/2020   Procedure: INSERTION PORT-A-CATH;  Surgeon: Aron Shoulders, MD;  Location: [facility] OR;  Service: General;  Laterality: Left    Social History: Social History   Socioeconomic History   Marital status: Legally Separated    Spouse name: Not on file   Number of children: 2   Years of education: Not on file   Highest education level: Not on file  Occupational History   Occupation: unemployed d/t COVID  Tobacco Use   Smoking status: Every Day    Current packs/day: 1.00    Average packs/day: 1 pack/day for 29.0 years (29.0 ttl pk-yrs)    Types: Cigarettes   Smokeless tobacco: Never  Vaping Use   Vaping status: Never Used  Substance and Sexual Activity   Alcohol use: No  Drug use: Yes    Frequency: 2.0 times per week    Types: Marijuana    Comment: hardly ever, maybe 3x a month now   Sexual activity: Yes  Other Topics Concern   Not on file  Social History Narrative   Right handed   Caffeine: 6 pack mtn dew cans per day   Lives with others   Social Drivers of Health   Financial Resource Strain: High Risk (12/08/2019)   Overall Financial Resource Strain (CARDIA)    Difficulty of Paying Living Expenses: Hard  Food Insecurity: No Food Insecurity (12/08/2019)   Hunger Vital Sign    Worried About Running Out of Food in the Last Year: Never true    Ran Out of Food in the Last Year: Never true  Transportation Needs: No Transportation Needs (12/26/2020)   PRAPARE - Administrator, Civil Service (Medical): No    Lack of Transportation (Non-Medical): No  Physical Activity: Inactive (12/26/2020)   Exercise Vital Sign    Days of Exercise per Week: 0 days    Minutes of Exercise  per Session: 0 min  Stress: Stress Concern Present (12/08/2019)   [employer] of Occupational Health - Occupational Stress Questionnaire    Feeling of Stress : Very much  Social Connections: Unknown (06/26/2023)   [facility]   Social Network    Social Network: Not on file  Intimate Partner Violence: Not At Risk (06/26/2023)   Received from [facility]   HITS    Over the last 12 months how often did your partner physically hurt you?: Never    Over the last 12 months how often did your partner insult you or talk down to you?: Never    Over the last 12 months how often did your partner threaten you with physical harm?: Never    Over the last 12 months how often did your partner scream or curse at you?: Never    Family History: Family History  Problem Relation Age of Onset   Heart disease Mother    Cancer Mother    Ovarian cysts Sister    Healthy Sister    Healthy Sister    Healthy Brother    Cancer Maternal Grandmother    Heart attack Maternal Grandmother    Cancer Maternal Grandfather    Cancer Paternal Grandmother    Colon cancer Paternal Grandfather    Other Paternal Grandfather        brain tumor in his 34s. had metal plate inserted   Psoriasis Daughter    Healthy Son    Esophageal cancer Neg Hx    Rectal cancer Neg Hx    Stomach cancer Neg Hx    Migraines Neg Hx     Current Medications:  Current Outpatient Medications:    cariprazine  (VRAYLAR ) 1.5 MG capsule, Take 1 capsule (1.5 mg total) by mouth daily., Disp: 30 capsule, Rfl: 2   Galcanezumab -gnlm (EMGALITY ) 120 MG/ML SOAJ, Inject 120 mg into the skin every 30 (thirty) days., Disp: 1.12 mL, Rfl: 11   omeprazole  (PRILOSEC) 40 MG capsule, TAKE 1 CAPSULE (40 MG TOTAL) BY MOUTH DAILY., Disp: 90 capsule, Rfl: 1   SUMAtriptan  (IMITREX ) 100 MG tablet, Take 1 tablet (100 mg total) by mouth once as needed for up to 1 dose. May repeat in 2 hours if headache persists or recurs., Disp: 12 tablet, Rfl: 12    venlafaxine  XR (EFFEXOR -XR) 150 MG 24 hr capsule, Take 1 capsule (150 mg total) by  mouth daily with breakfast., Disp: 90 capsule, Rfl: 1   Vitamin D , Ergocalciferol , (DRISDOL ) 1.25 MG (50000 UNIT) CAPS capsule, TAKE 1 CAPSULE (50,000 UNITS TOTAL) BY MOUTH EVERY 7 (SEVEN) DAYS, Disp: 12 capsule, Rfl: 1   Allergies: Allergies  Allergen Reactions   Doxycycline  Rash    Patient developed diffuse pruritic rash involving her trunk 2 days after being given metronidazole  and doxycycline  to treat an H. pylori infection.  No other symptoms besides the rash.   Gadavist  [Gadobutrol ] Nausea Only    Mild nausea but no vomiting after injection.  Ok after 5 minutes.   Metronidazole  Rash    Patient developed diffuse pruritic rash involving her trunk 2 days after being given metronidazole  and doxycycline  to treat an H. pylori infection.  No other symptoms besides the rash.    REVIEW OF SYSTEMS:   Review of Systems  Constitutional:  Negative for chills, fatigue and fever.  HENT:   Negative for lump/mass, mouth sores, nosebleeds, sore throat and trouble swallowing.   Eyes:  Negative for eye problems.  Respiratory:  Negative for cough and shortness of breath.   Cardiovascular:  Negative for chest pain, leg swelling and palpitations.  Gastrointestinal:  Negative for abdominal pain, constipation, diarrhea, nausea and vomiting.  Genitourinary:  Negative for bladder incontinence, difficulty urinating, dysuria, frequency, hematuria and nocturia.   Musculoskeletal:  Negative for arthralgias, back pain, flank pain, myalgias and neck pain.  Skin:  Negative for itching and rash.  Neurological:  Negative for dizziness, headaches and numbness.  Hematological:  Does not bruise/bleed easily.  Psychiatric/Behavioral:  Negative for depression, sleep disturbance and suicidal ideas. The patient is not nervous/anxious.   All other systems reviewed and are negative.    VITALS:   There were no vitals taken for this visit.   Wt Readings from Last 3 Encounters:  05/15/24 112 lb 6.4 oz (51 kg)  02/16/24 111 lb (50.3 kg)  02/10/24 112 lb (50.8 kg)    There is no height or weight on file to calculate BMI.  Performance status (ECOG): 1 - Symptomatic but completely ambulatory  PHYSICAL EXAM:   Physical Exam Vitals and nursing note reviewed. Exam conducted with a chaperone present.  Constitutional:      Appearance: Normal appearance.  Cardiovascular:     Rate and Rhythm: Normal rate and regular rhythm.     Pulses: Normal pulses.     Heart sounds: Normal heart sounds.  Pulmonary:     Effort: Pulmonary effort is normal.     Breath sounds: Normal breath sounds.  Abdominal:     Palpations: Abdomen is soft. There is no hepatomegaly, splenomegaly or mass.     Tenderness: There is no abdominal tenderness.  Musculoskeletal:     Right lower leg: No edema.     Left lower leg: No edema.  Lymphadenopathy:     Cervical: No cervical adenopathy.     Right cervical: No superficial, deep or posterior cervical adenopathy.    Left cervical: No superficial, deep or posterior cervical adenopathy.     Upper Body:     Right upper body: No supraclavicular or axillary adenopathy.     Left upper body: No supraclavicular or axillary adenopathy.  Neurological:     General: No focal deficit present.     Mental Status: She is alert and oriented to person, place, and time.  Psychiatric:        Mood and Affect: Mood normal.        Behavior: Behavior normal.  LABS:   CBC     Component Value Date/Time   WBC 4.6 05/29/2024 0900   RBC 4.37 05/29/2024 0900   HGB 13.7 05/29/2024 0900   HGB 12.5 08/29/2007 1545   HCT 41.0 05/29/2024 0900   HCT 37.0 08/29/2007 1545   PLT 265 05/29/2024 0900   PLT 277 08/29/2007 1545   MCV 93.8 05/29/2024 0900   MCV 84.5 08/29/2007 1545   MCH 31.4 05/29/2024 0900   MCHC 33.4 05/29/2024 0900   RDW 13.2 05/29/2024 0900   RDW 16.2 (H) 08/29/2007 1545   LYMPHSABS 1.7 05/29/2024 0900    LYMPHSABS 2.3 08/29/2007 1545   MONOABS 0.4 05/29/2024 0900   MONOABS 0.3 08/29/2007 1545   EOSABS 0.1 05/29/2024 0900   EOSABS 0.2 08/29/2007 1545   BASOSABS 0.1 05/29/2024 0900   BASOSABS 0.0 08/29/2007 1545    CMP      Component Value Date/Time   NA 136 05/29/2024 0900   NA 140 10/18/2023 1430   K 3.9 05/29/2024 0900   CL 104 05/29/2024 0900   CO2 24 05/29/2024 0900   GLUCOSE 119 (H) 05/29/2024 0900   BUN 9 05/29/2024 0900   BUN 6 10/18/2023 1430   CREATININE 0.76 05/29/2024 0900   CALCIUM 9.5 05/29/2024 0900   PROT 7.5 05/29/2024 0900   ALBUMIN 3.9 05/29/2024 0900   AST 24 05/29/2024 0900   ALT 16 05/29/2024 0900   ALKPHOS 77 05/29/2024 0900   BILITOT 0.6 05/29/2024 0900   GFRNONAA >60 05/29/2024 0900   GFRAA >60 07/30/2020 1231     No results found for: CEA1, CEA / No results found for: CEA1, CEA No results found for: PSA1 No results found for: CAN199 No results found for: CAN125  No results found for: TOTALPROTELP, ALBUMINELP, A1GS, A2GS, BETS, BETA2SER, GAMS, MSPIKE, SPEI Lab Results  Component Value Date   TIBC 465 (H) 05/29/2024   TIBC 426 11/22/2023   TIBC 345 05/18/2023   FERRITIN 7 (L) 05/29/2024   FERRITIN 31 11/22/2023   FERRITIN 63 05/18/2023   IRONPCTSAT 8 (L) 05/29/2024   IRONPCTSAT 9 (L) 11/22/2023   IRONPCTSAT 20 05/18/2023   Lab Results  Component Value Date   LDH 112 05/29/2024   LDH 96 (L) 11/22/2023   LDH 94 (L) 05/18/2023     STUDIES:   CT CHEST ABDOMEN PELVIS W CONTRAST Result Date: 06/02/2024 CLINICAL DATA:  History of right toe melanoma, follow-up. * Tracking Code: BO * EXAM: CT CHEST, ABDOMEN, AND PELVIS WITH CONTRAST TECHNIQUE: Multidetector CT imaging of the chest, abdomen and pelvis was performed following the standard protocol during bolus administration of intravenous contrast. RADIATION DOSE REDUCTION: This exam was performed according to the departmental dose-optimization program which  includes automated exposure control, adjustment of the mA and/or kV according to patient size and/or use of iterative reconstruction technique. CONTRAST:  OMNIPAQUE  IOHEXOL  300 MG/ML  SOLN COMPARISON:  Multiple priors including most recent CT May 18, 2023 FINDINGS: CT CHEST FINDINGS Cardiovascular: No significant vascular findings. Normal heart size. No pericardial effusion. Mediastinum/Nodes: Calcified mediastinal lymph node. No pathologically enlarged mediastinal, hilar or axillary lymph nodes. Symmetric distal esophageal wall thickening. Lungs/Pleura: No suspicious pulmonary nodules or masses. Biapical pleuroparenchymal scarring. No pleural effusion. No pneumothorax. Musculoskeletal: No aggressive lytic or blastic lesion of bone. Multilevel degenerative changes spine. CT ABDOMEN PELVIS FINDINGS Hepatobiliary: Tiny bilobar hypodense hepatic lesions technically too small to accurately characterize but stable dating back to March 2022 and favored benign. No solid enhancing hepatic lesion.  Gallbladder is unremarkable. No biliary ductal dilation. Pancreas: No pancreatic ductal dilation or evidence of acute inflammation. Spleen: No splenomegaly. Adrenals/Urinary Tract: Bilateral adrenal glands appear normal. No hydronephrosis. Kidneys demonstrate symmetric enhancement. Urinary bladder is unremarkable for degree of distension. Stomach/Bowel: Stomach is unremarkable for degree of distension. Duodenal diverticulum. No pathologic dilation of small or large bowel. No evidence of acute bowel inflammation. Vascular/Lymphatic: Normal caliber abdominal aorta. Prominent left gonadal vein and left pelvic collateral vessels. No enlarged abdominal or pelvic lymph nodes. Reproductive: Uterus is unremarkable. Homogeneous hypodense 4.7 cm left ovarian cyst. Other: No significant abdominopelvic free fluid. Musculoskeletal: No aggressive lytic or blastic lesion of bone. IMPRESSION: 1. No evidence of metastatic disease within the  chest, abdomen, or pelvis. 2. Homogeneous hypodense 4.7 cm left ovarian cyst. Recommend follow-up pelvic ultrasound in 6-12 months. 3. Symmetric distal esophageal wall thickening, which can be seen in the setting of esophagitis. Electronically Signed   By: Reyes Holder M.D.   On: 06/02/2024 12:58

## 2024-06-06 NOTE — Progress Notes (Signed)
 [facility] [address] [place] [address]    Clinic Day:  06/07/2024  Referring physician: Tobie Suzzane POUR, MD  Patient Care Team: Tobie Suzzane POUR, MD as PCP - General (Internal Medicine) Okey Vina GAILS, MD as PCP - Cardiology (Cardiology)   ASSESSMENT & PLAN:   Assessment: 1.  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 -She underwent right third toe amputation at PIP level on 03/07/2020 for positive margins.  Pathology did not show any residual melanoma. -CT chest and abdomen on 03/21/2020 did not show any evidence of metastatic disease. - Adjuvant pembrolizumab  from 03/25/2020 through 04/02/2021   2.  Family history: -Mother died of throat cancer.  Maternal grandmother had breast cancer.  Maternal aunt had pancreatic cancer.  Maternal grandfather had bone cancer.  Maternal great grandmother died of melanoma. -She will benefit from genetic testing.   3.  Right thyroid  nodule: -PET scan on 12/20/2019 showed uptake in the right thyroid . -Ultrasound on 02/05/2020 did not show any suspicious nodules. -FNA on 05/01/2020 showed benign follicular nodule, Bethesda category 2.    Plan: 1.  Stage IIIb (T2B N2A) malignant melanoma of the right third toe, BRAF V6 100 E-: - She does not have any fevers or infections.  No weight loss or night sweats.  No new pains. - Reviewed labs today: Normal LFTs.  LDH normal.  CBC was normal. - CT CAP (05/29/2024): No evidence of recurrence or metastatic disease.  There is a 4.7 cm left ovarian cyst. - She will follow-up with us  in 1 year with repeat labs and a CT scan.  We will stop doing CT scans after 5 years since completion of adjuvant pembrolizumab .  Until then we will do yearly once imaging. - Will make a referral to GYN for follow-up of  the left ovarian cyst.   2.  Normocytic anemia: - Last Feraheme  was on 11/12/2022.  Ferritin is low at 7.  She was told to start taking iron  tablet daily.   3.  Hypothyroidism: - Latest TSH is normal at 3.6.  She is no longer on Synthroid .  No further testing is needed in the future.    No orders of the defined types were placed in this encounter.     Donna Munoz,acting as a Neurosurgeon for Alean Stands, MD.,have documented all relevant documentation on the behalf of Alean Stands, MD,as directed by  Alean Stands, MD while in the presence of Alean Stands, MD.  I, Alean Stands MD, have reviewed the above documentation for accuracy and completeness, and I agree with the above.    Alean Stands, MD   7/30/20259:09 AM  CHIEF COMPLAINT:   Diagnosis: malignant melanoma    Cancer Staging  Melanoma of skin (HCC) Staging form: Melanoma of the Skin, AJCC 8th Edition - Clinical stage from 02/08/2020: Stage III (cT2b, cN2a, cM0) - Unsigned    Prior Therapy: Adjuvant Keytruda   Current Therapy: Observation   HISTORY OF PRESENT ILLNESS:   Oncology History  Melanoma of skin (HCC)  12/11/2019 Initial Diagnosis   Melanoma of skin (HCC)   02/19/2020 Genetic Testing   BRAF Mutation Analysis     03/25/2020 - 04/02/2021 Chemotherapy   Patient is on Treatment Plan : MELANOMA Pembrolizumab  q21d        INTERVAL HISTORY:  Donna Munoz is a 48 y.o. female presenting to clinic today for follow up of malignant melanoma. She was last seen by me on 11/29/2023.  Since her last visit, she underwent CT CAP on 05/29/2024 that found:  No evidence of metastatic disease within the chest, abdomen, or pelvis. Homogeneous hypodense 4.7 cm left ovarian cyst. Symmetric distal esophageal wall thickening, which can be seen in the setting of esophagitis.  Donna Munoz had an EGD and colonoscopy on 12/21/2023 that found: two 3-6 mm polyps in the descending colon, a single gastric polyp,  Hill Grade IV gastroesophageal flap valve, and atrophic, friable with contact bleeding and nodular mucosa in the gastric body. The gastric body and gastric antrum biopsies found gastric antral mucosa with mildly active chronic gastritis positive for H. Pylori. 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 Immunohistochemistry showed positivity with CD56 and chromogranin and Ki-67 showed a proliferation rate of less than 2%.   Today, she states that she is doing well overall. Her appetite level is at 100%. Her energy level is at 80%.  PAST MEDICAL HISTORY:   Past Medical History: Past Medical History:  Diagnosis Date   Anemia    Anxiety    Depression    GERD (gastroesophageal reflux disease)    Headache    Shingles    Tobacco abuse 05/06/2020    Surgical History: Past Surgical History:  Procedure Laterality Date   AMPUTATION TOE Right 03/07/2020   Procedure: RIGHT THIRD TOE AMPUTATION;  Surgeon: Aron Shoulders, MD;  Location: [facility] OR;  Service: General;  Laterality: Right;   BREAST LUMPECTOMY  age 38   CESAREAN SECTION  01/06/2006   CESAREAN SECTION  10/15/2016   DENTAL SURGERY  2019   INGUINAL HERNIA REPAIR Right 01/30/2020   Procedure: RIGHT INGUINAL HERNIA REPAIR WITH  MESH;  Surgeon: Aron Shoulders, MD;  Location: [facility];  Service: General;  Laterality: Right;   INSERTION OF MESH Right 01/30/2020   Procedure: INSERTION OF MESH;  Surgeon: Aron Shoulders, MD;  Location: [facility];  Service: General;  Laterality: Right;   IR CV LINE INJECTION  09/11/2020   IR IMAGING GUIDED PORT INSERTION  09/12/2020   IR REMOVAL TUN ACCESS W/ PORT W/O FL MOD SED  09/12/2020   IRRIGATION AND DEBRIDEMENT ABSCESS Right 03/07/2020   Procedure: Aspiration of Right Groin Seroma;  Surgeon: Aron Shoulders, MD;  Location[facility];  Service: General;  Laterality: Right;   MELANOMA EXCISION WITH SENTINEL LYMPH NODE BIOPSY Right 01/30/2020   Procedure:  WIDE LOWER EXCISION RIGHT THIRD TOE MELANOMA EXCISION WITH SENTINEL LYMPH NODE BIOPSY;  Surgeon: Aron Shoulders, MD;  Location: [facility];  Service: General;  Laterality: Right;   PORT-A-CATH REMOVAL N/A 08/12/2022   Procedure: MINOR REMOVAL PORT-A-CATH;  Surgeon: Mavis Anes, MD;  Location: [facility];  Service: General;  Laterality: N/A;   PORTACATH PLACEMENT N/A 03/07/2020   Procedure: INSERTION PORT-A-CATH;  Surgeon: Aron Shoulders, MD;  Location: [facility] OR;  Service: General;  Laterality: Left    Social History: Social History   Socioeconomic History   Marital status: Legally Separated    Spouse name: Not on file   Number of children: 2   Years of education: Not on file   Highest education level: Not on file  Occupational History   Occupation: unemployed d/t COVID  Tobacco Use   Smoking status: Every Day    Current packs/day: 1.00    Average packs/day: 1 pack/day for 29.0 years (  29.0 ttl pk-yrs)    Types: Cigarettes   Smokeless tobacco: Never  Vaping Use   Vaping status: Never Used  Substance and Sexual Activity   Alcohol use: No   Drug use: Yes    Frequency: 2.0 times per week    Types: Marijuana    Comment: hardly ever, maybe 3x a month now   Sexual activity: Yes  Other Topics Concern   Not on file  Social History Narrative   Right handed   Caffeine: 6 pack mtn dew cans per day   Lives with others   Social Drivers of Health   Financial Resource Strain: High Risk (12/08/2019)   Overall Financial Resource Strain (CARDIA)    Difficulty of Paying Living Expenses: Hard  Food Insecurity: No Food Insecurity (12/08/2019)   Hunger Vital Sign    Worried About Running Out of Food in the Last Year: Never true    Ran Out of Food in the Last Year: Never true  Transportation Needs: No Transportation Needs (12/26/2020)   PRAPARE - Administrator, Civil Service (Medical): No    Lack of Transportation (Non-Medical): No  Physical Activity: Inactive (12/26/2020)    Exercise Vital Sign    Days of Exercise per Week: 0 days    Minutes of Exercise per Session: 0 min  Stress: Stress Concern Present (12/08/2019)   [employer] of Occupational Health - Occupational Stress Questionnaire    Feeling of Stress : Very much  Social Connections: Unknown (06/26/2023)   Received from [facility]   Social Network    Social Network: Not on file  Intimate Partner Violence: Not At Risk (06/26/2023)   Received from [facility]   HITS    Over the last 12 months how often did your partner physically hurt you?: Never    Over the last 12 months how often did your partner insult you or talk down to you?: Never    Over the last 12 months how often did your partner threaten you with physical harm?: Never    Over the last 12 months how often did your partner scream or curse at you?: Never    Family History: Family History  Problem Relation Age of Onset   Heart disease Mother    Cancer Mother    Ovarian cysts Sister    Healthy Sister    Healthy Sister    Healthy Brother    Cancer Maternal Grandmother    Heart attack Maternal Grandmother    Cancer Maternal Grandfather    Cancer Paternal Grandmother    Colon cancer Paternal Grandfather    Other Paternal Grandfather        brain tumor in his 13s. had metal plate inserted   Psoriasis Daughter    Healthy Son    Esophageal cancer Neg Hx    Rectal cancer Neg Hx    Stomach cancer Neg Hx    Migraines Neg Hx     Current Medications:  Current Outpatient Medications:    gabapentin  (NEURONTIN ) 100 MG capsule, Take 100 mg by mouth 3 (three) times daily as needed., Disp: , Rfl:    ibuprofen  (ADVIL ) 800 MG tablet, Take 800 mg by mouth every 8 (eight) hours as needed., Disp: , Rfl:    cariprazine  (VRAYLAR ) 1.5 MG capsule, Take 1 capsule (1.5 mg total) by mouth daily., Disp: 30 capsule, Rfl: 2   Galcanezumab -gnlm (EMGALITY ) 120 MG/ML SOAJ, Inject 120 mg into the skin every 30 (thirty) days., Disp: 1.12 mL, Rfl:  11    omeprazole  (PRILOSEC) 40 MG capsule, TAKE 1 CAPSULE (40 MG TOTAL) BY MOUTH DAILY., Disp: 90 capsule, Rfl: 1   SUMAtriptan  (IMITREX ) 100 MG tablet, Take 1 tablet (100 mg total) by mouth once as needed for up to 1 dose. May repeat in 2 hours if headache persists or recurs., Disp: 12 tablet, Rfl: 12   venlafaxine  XR (EFFEXOR -XR) 150 MG 24 hr capsule, Take 1 capsule (150 mg total) by mouth daily with breakfast., Disp: 90 capsule, Rfl: 1   Vitamin D , Ergocalciferol , (DRISDOL ) 1.25 MG (50000 UNIT) CAPS capsule, TAKE 1 CAPSULE (50,000 UNITS TOTAL) BY MOUTH EVERY 7 (SEVEN) DAYS, Disp: 12 capsule, Rfl: 1   Allergies: Allergies  Allergen Reactions   Doxycycline  Rash    Patient developed diffuse pruritic rash involving her trunk 2 days after being given metronidazole  and doxycycline  to treat an H. pylori infection.  No other symptoms besides the rash.   Gadavist  [Gadobutrol ] Nausea Only    Mild nausea but no vomiting after injection.  Ok after 5 minutes.   Metronidazole  Rash    Patient developed diffuse pruritic rash involving her trunk 2 days after being given metronidazole  and doxycycline  to treat an H. pylori infection.  No other symptoms besides the rash.    REVIEW OF SYSTEMS:   Review of Systems  Constitutional:  Negative for chills, fatigue and fever.  HENT:   Negative for lump/mass, mouth sores, nosebleeds, sore throat and trouble swallowing.   Eyes:  Negative for eye problems.  Respiratory:  Positive for cough and shortness of breath.   Cardiovascular:  Negative for chest pain, leg swelling and palpitations.  Gastrointestinal:  Positive for nausea. Negative for abdominal pain, constipation, diarrhea and vomiting.  Genitourinary:  Negative for bladder incontinence, difficulty urinating, dysuria, frequency, hematuria and nocturia.   Musculoskeletal:  Negative for arthralgias, back pain, flank pain, myalgias and neck pain.  Skin:  Negative for itching and rash.  Neurological:  Positive for  numbness. Negative for dizziness and headaches.  Hematological:  Does not bruise/bleed easily.  Psychiatric/Behavioral:  Positive for depression and sleep disturbance. Negative for suicidal ideas. The patient is nervous/anxious.   All other systems reviewed and are negative.    VITALS:   Blood pressure 93/71, pulse 60, temperature 97.6 F (36.4 C), temperature source Oral, resp. rate 18, weight 115 lb (52.2 kg), SpO2 98%.  Wt Readings from Last 3 Encounters:  06/07/24 115 lb (52.2 kg)  05/15/24 112 lb 6.4 oz (51 kg)  02/16/24 111 lb (50.3 kg)    Body mass index is 21.03 kg/m.  Performance status (ECOG): 1 - Symptomatic but completely ambulatory  PHYSICAL EXAM:   Physical Exam Vitals and nursing note reviewed. Exam conducted with a chaperone present.  Constitutional:      Appearance: Normal appearance.  Cardiovascular:     Rate and Rhythm: Normal rate and regular rhythm.     Pulses: Normal pulses.     Heart sounds: Normal heart sounds.  Pulmonary:     Effort: Pulmonary effort is normal.     Breath sounds: Normal breath sounds.  Abdominal:     Palpations: Abdomen is soft. There is no hepatomegaly, splenomegaly or mass.     Tenderness: There is no abdominal tenderness.  Musculoskeletal:     Right lower leg: No edema.     Left lower leg: No edema.  Lymphadenopathy:     Cervical: No cervical adenopathy.     Right cervical: No superficial, deep or posterior cervical adenopathy.  Left cervical: No superficial, deep or posterior cervical adenopathy.     Upper Body:     Right upper body: No supraclavicular or axillary adenopathy.     Left upper body: No supraclavicular or axillary adenopathy.  Neurological:     General: No focal deficit present.     Mental Status: She is alert and oriented to person, place, and time.  Psychiatric:        Mood and Affect: Mood normal.        Behavior: Behavior normal.     LABS:   CBC     Component Value Date/Time   WBC 4.6  05/29/2024 0900   RBC 4.37 05/29/2024 0900   HGB 13.7 05/29/2024 0900   HGB 12.5 08/29/2007 1545   HCT 41.0 05/29/2024 0900   HCT 37.0 08/29/2007 1545   PLT 265 05/29/2024 0900   PLT 277 08/29/2007 1545   MCV 93.8 05/29/2024 0900   MCV 84.5 08/29/2007 1545   MCH 31.4 05/29/2024 0900   MCHC 33.4 05/29/2024 0900   RDW 13.2 05/29/2024 0900   RDW 16.2 (H) 08/29/2007 1545   LYMPHSABS 1.7 05/29/2024 0900   LYMPHSABS 2.3 08/29/2007 1545   MONOABS 0.4 05/29/2024 0900   MONOABS 0.3 08/29/2007 1545   EOSABS 0.1 05/29/2024 0900   EOSABS 0.2 08/29/2007 1545   BASOSABS 0.1 05/29/2024 0900   BASOSABS 0.0 08/29/2007 1545    CMP      Component Value Date/Time   NA 136 05/29/2024 0900   NA 140 10/18/2023 1430   K 3.9 05/29/2024 0900   CL 104 05/29/2024 0900   CO2 24 05/29/2024 0900   GLUCOSE 119 (H) 05/29/2024 0900   BUN 9 05/29/2024 0900   BUN 6 10/18/2023 1430   CREATININE 0.76 05/29/2024 0900   CALCIUM 9.5 05/29/2024 0900   PROT 7.5 05/29/2024 0900   ALBUMIN 3.9 05/29/2024 0900   AST 24 05/29/2024 0900   ALT 16 05/29/2024 0900   ALKPHOS 77 05/29/2024 0900   BILITOT 0.6 05/29/2024 0900   GFRNONAA >60 05/29/2024 0900   GFRAA >60 07/30/2020 1231     No results found for: CEA1, CEA / No results found for: CEA1, CEA No results found for: PSA1 No results found for: CAN199 No results found for: CAN125  No results found for: TOTALPROTELP, ALBUMINELP, A1GS, A2GS, BETS, BETA2SER, GAMS, MSPIKE, SPEI Lab Results  Component Value Date   TIBC 465 (H) 05/29/2024   TIBC 426 11/22/2023   TIBC 345 05/18/2023   FERRITIN 7 (L) 05/29/2024   FERRITIN 31 11/22/2023   FERRITIN 63 05/18/2023   IRONPCTSAT 8 (L) 05/29/2024   IRONPCTSAT 9 (L) 11/22/2023   IRONPCTSAT 20 05/18/2023   Lab Results  Component Value Date   LDH 112 05/29/2024   LDH 96 (L) 11/22/2023   LDH 94 (L) 05/18/2023     STUDIES:   CT CHEST ABDOMEN PELVIS W CONTRAST Result Date:  06/02/2024 CLINICAL DATA:  History of right toe melanoma, follow-up. * Tracking Code: BO * EXAM: CT CHEST, ABDOMEN, AND PELVIS WITH CONTRAST TECHNIQUE: Multidetector CT imaging of the chest, abdomen and pelvis was performed following the standard protocol during bolus administration of intravenous contrast. RADIATION DOSE REDUCTION: This exam was performed according to the departmental dose-optimization program which includes automated exposure control, adjustment of the mA and/or kV according to patient size and/or use of iterative reconstruction technique. CONTRAST:  OMNIPAQUE  IOHEXOL  300 MG/ML  SOLN COMPARISON:  Multiple priors including most recent CT May 18, 2023 FINDINGS: CT CHEST FINDINGS Cardiovascular: No significant vascular findings. Normal heart size. No pericardial effusion. Mediastinum/Nodes: Calcified mediastinal lymph node. No pathologically enlarged mediastinal, hilar or axillary lymph nodes. Symmetric distal esophageal wall thickening. Lungs/Pleura: No suspicious pulmonary nodules or masses. Biapical pleuroparenchymal scarring. No pleural effusion. No pneumothorax. Musculoskeletal: No aggressive lytic or blastic lesion of bone. Multilevel degenerative changes spine. CT ABDOMEN PELVIS FINDINGS Hepatobiliary: Tiny bilobar hypodense hepatic lesions technically too small to accurately characterize but stable dating back to March 2022 and favored benign. No solid enhancing hepatic lesion. Gallbladder is unremarkable. No biliary ductal dilation. Pancreas: No pancreatic ductal dilation or evidence of acute inflammation. Spleen: No splenomegaly. Adrenals/Urinary Tract: Bilateral adrenal glands appear normal. No hydronephrosis. Kidneys demonstrate symmetric enhancement. Urinary bladder is unremarkable for degree of distension. Stomach/Bowel: Stomach is unremarkable for degree of distension. Duodenal diverticulum. No pathologic dilation of small or large bowel. No evidence of acute bowel inflammation.  Vascular/Lymphatic: Normal caliber abdominal aorta. Prominent left gonadal vein and left pelvic collateral vessels. No enlarged abdominal or pelvic lymph nodes. Reproductive: Uterus is unremarkable. Homogeneous hypodense 4.7 cm left ovarian cyst. Other: No significant abdominopelvic free fluid. Musculoskeletal: No aggressive lytic or blastic lesion of bone. IMPRESSION: 1. No evidence of metastatic disease within the chest, abdomen, or pelvis. 2. Homogeneous hypodense 4.7 cm left ovarian cyst. Recommend follow-up pelvic ultrasound in 6-12 months. 3. Symmetric distal esophageal wall thickening, which can be seen in the setting of esophagitis. Electronically Signed   By: Reyes Holder M.D.   On: 06/02/2024 12:58

## 2024-06-06 NOTE — Progress Notes (Signed)
 Called patient regarding no show appointment on 7/28. Patient states adamantly that appointment was cancelled and moved to Wednesday. Explained to patient that I do not see that appointment. Patient also tells me that her appointments should align with her family member, which is not scheduled as she is explaining. Attempted to schedule patient for her own appointment on 7/30 at 8:45 to see Dr. Rogers, but she states that her appointment is at 1:45 and that's when she is available. Attempted to explain to patient that we do not have that time available to her and that will not align with her family's appointment needs either. Patient becomes angry, telling me that there is no need to argue with her and abruptly ends the phone call.

## 2024-06-07 ENCOUNTER — Inpatient Hospital Stay (HOSPITAL_BASED_OUTPATIENT_CLINIC_OR_DEPARTMENT_OTHER): Admitting: Hematology

## 2024-06-07 ENCOUNTER — Ambulatory Visit: Admitting: Hematology

## 2024-06-07 VITALS — BP 93/71 | HR 60 | Temp 97.6°F | Resp 18 | Wt 115.0 lb

## 2024-06-07 DIAGNOSIS — N83202 Unspecified ovarian cyst, left side: Secondary | ICD-10-CM | POA: Diagnosis not present

## 2024-06-07 DIAGNOSIS — D649 Anemia, unspecified: Secondary | ICD-10-CM | POA: Diagnosis not present

## 2024-06-07 DIAGNOSIS — E039 Hypothyroidism, unspecified: Secondary | ICD-10-CM | POA: Diagnosis not present

## 2024-06-07 DIAGNOSIS — C4371 Malignant melanoma of right lower limb, including hip: Secondary | ICD-10-CM

## 2024-06-07 DIAGNOSIS — F17210 Nicotine dependence, cigarettes, uncomplicated: Secondary | ICD-10-CM | POA: Diagnosis not present

## 2024-06-07 DIAGNOSIS — Z79899 Other long term (current) drug therapy: Secondary | ICD-10-CM | POA: Diagnosis not present

## 2024-06-07 DIAGNOSIS — Z85820 Personal history of malignant melanoma of skin: Secondary | ICD-10-CM | POA: Diagnosis not present

## 2024-06-07 NOTE — Patient Instructions (Signed)
 [facility] at [facility] Discharge Instructions   You were seen and examined today by Dr. Ellin Saba.  He reviewed the results of your lab work which are normal/stable.   He reviewed the results of your CT scan which did not show any evidence of cancer.   We will see you back in .   Return as scheduled.    Thank you for choosing [facility] at [facility] to provide your oncology and hematology care.  To afford each patient quality time with our provider, please arrive at least 15 minutes before your scheduled appointment time.   If you have a lab appointment with the Cancer Center please come in thru the Main Entrance and check in at the main information desk.  You need to re-schedule your appointment should you arrive 10 or more minutes late.  We strive to give you quality time with our providers, and arriving late affects you and other patients whose appointments are after yours.  Also, if you no show three or more times for appointments you may be dismissed from the clinic at the providers discretion.     Again, thank you for choosing [facility]  Our hope is that these requests will decrease the amount of time that you wait before being seen by our physicians.       _____________________________________________________________  Should you have questions after your visit to [facility], please contact our office at [phone] and follow the prompts.  Our office hours are 8:00 a.m. and 4:30 p.m. Monday - Friday.  Please note that voicemails left after 4:00 p.m. may not be returned until the following business day.  We are closed weekends and major holidays.  You do have access to a nurse 24-7, just call the main number to the clinic [phone] and do not press any options, hold on the line and a nurse will answer the phone.    For prescription refill requests, have your pharmacy contact our office and allow 72  hours.    Due to Covid, you will need to wear a mask upon entering the hospital. If you do not have a mask, a mask will be given to you at the Main Entrance upon arrival. For doctor visits, patients may have 1 support person age 62 or older with them. For treatment visits, patients can not have anyone with them due to social distancing guidelines and our immunocompromised population.

## 2024-06-14 ENCOUNTER — Other Ambulatory Visit: Payer: Self-pay | Admitting: Internal Medicine

## 2024-06-14 ENCOUNTER — Ambulatory Visit: Payer: Medicaid Other | Admitting: Urology

## 2024-06-14 ENCOUNTER — Ambulatory Visit: Admitting: Urology

## 2024-06-14 DIAGNOSIS — F331 Major depressive disorder, recurrent, moderate: Secondary | ICD-10-CM

## 2024-07-18 ENCOUNTER — Telehealth: Admitting: Neurology

## 2024-07-19 ENCOUNTER — Ambulatory Visit: Admitting: Internal Medicine

## 2024-07-19 ENCOUNTER — Encounter: Payer: Self-pay | Admitting: Internal Medicine

## 2024-07-19 ENCOUNTER — Telehealth (INDEPENDENT_AMBULATORY_CARE_PROVIDER_SITE_OTHER): Admitting: Internal Medicine

## 2024-07-19 DIAGNOSIS — F411 Generalized anxiety disorder: Secondary | ICD-10-CM | POA: Diagnosis not present

## 2024-07-19 DIAGNOSIS — G43709 Chronic migraine without aura, not intractable, without status migrainosus: Secondary | ICD-10-CM | POA: Diagnosis not present

## 2024-07-19 DIAGNOSIS — F331 Major depressive disorder, recurrent, moderate: Secondary | ICD-10-CM

## 2024-07-19 MED ORDER — HYDROXYZINE PAMOATE 25 MG PO CAPS: 25.0000 mg | ORAL_CAPSULE | Freq: Three times a day (TID) | ORAL | 2 refills | Status: AC | PRN

## 2024-07-19 NOTE — Assessment & Plan Note (Signed)
 Better controlled, improved with Effexor  150 mg QD and Vraylar  1.5 mg QD Effexor  can have limited benefit with cluster headache and neuropathy as well Has tried Cymbalta  and Elavil, but did not tolerate them Has tried SSRI in the past without much benefit

## 2024-07-19 NOTE — Assessment & Plan Note (Deleted)
 Chronic numbness of the RLE > LLE and LUE Likely due to polyneuropathy DPA pulse intact Had prescribed Cymbalta  30 mg QD for neuropathy and MDD - but c/o nausea, unclear if related to H Pylori, but she preferred to change treatment, started Effexor  later If persistent, can switch to Lyrica

## 2024-07-19 NOTE — Assessment & Plan Note (Signed)
 Has anxiety and easy irritability On Effexor  and Vraylar  for MDD Added hydroxyzine  as needed for anxiety/insomnia Advised to perform simple relaxation techniques Referred to [facility] therapy

## 2024-07-19 NOTE — Progress Notes (Signed)
 Virtual Visit via Video Note   Because of Mishayla Swarm's co-morbid illnesses, she is at least at moderate risk for complications without adequate follow up.  This format is felt to be most appropriate for this patient at this time.  All issues noted in this document were discussed and addressed.  A limited physical exam was performed with this format.      Evaluation Performed:  Follow-up visit  Date:  07/19/2024   ID:  Donna Munoz, DOB 01-13-1976, MRN [id number]  Patient Location: Home Provider Location: Office/Clinic  Participants: Patient Location of Patient: Home Location of Provider: Telehealth Consent was obtain for visit to be over via telehealth. I verified that I am speaking with the correct person using two identifiers.  PCP:  Tobie Suzzane POUR, MD   Chief Complaint: Follow-up of MDD and GAD  History of Present Illness:    Donna Munoz is a 48 y.o. 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  MDD and GAD: She has noticed improvement in apathy with Vraylar .  She also takes Effexor  150 mg QD.  She has chronic insomnia, decreased appetite, decreased concentration and easy irritability.  Her main concern is agitation/easy irritability now.  Her sleep has improved now. She has tried amitriptyline in the past, which caused drowsiness.  She was doing better with Cymbalta , but stopped taking it due to persistent nausea. Denies any SI or HI currently.  The patient does not have symptoms concerning for COVID-19 infection (fever, chills, cough, or new shortness of breath).   Past Medical, Surgical, Social History, Allergies, and Medications have been Reviewed.  Past Medical History:  Diagnosis Date   Anemia    Anxiety    Depression    GERD (gastroesophageal reflux disease)    Headache    Shingles    Tobacco abuse 05/06/2020   Past Surgical History:  Procedure Laterality Date    AMPUTATION TOE Right 03/07/2020   Procedure: RIGHT THIRD TOE AMPUTATION;  Surgeon: Aron Shoulders, MD;  Location: [facility];  Service: General;  Laterality: Right;   BREAST LUMPECTOMY  age 64   CESAREAN SECTION  01/06/2006   CESAREAN SECTION  10/15/2016   DENTAL SURGERY  2019   INGUINAL HERNIA REPAIR Right 01/30/2020   Procedure: RIGHT INGUINAL HERNIA REPAIR WITH  MESH;  Surgeon: Aron Shoulders, MD;  Location: [facility];  Service: General;  Laterality: Right;   INSERTION OF MESH Right 01/30/2020   Procedure: INSERTION OF MESH;  Surgeon: Aron Shoulders, MD;  Location: [facility];  Service: General;  Laterality: Right;   IR CV LINE INJECTION  09/11/2020   IR IMAGING GUIDED PORT INSERTION  09/12/2020   IR REMOVAL TUN ACCESS W/ PORT W/O FL MOD SED  09/12/2020   IRRIGATION AND DEBRIDEMENT ABSCESS Right 03/07/2020   Procedure: Aspiration of Right Groin Seroma;  Surgeon: Aron Shoulders, MD;  Location: [facility] OR;  Service: General;  Laterality: Right;   MELANOMA EXCISION WITH SENTINEL LYMPH NODE BIOPSY Right 01/30/2020   Procedure: WIDE LOWER EXCISION RIGHT THIRD TOE MELANOMA EXCISION WITH SENTINEL LYMPH NODE BIOPSY;  Surgeon: Aron Shoulders, MD;  Location: [facility];  Service: General;  Laterality: Right;   PORT-A-CATH REMOVAL N/A 08/12/2022   Procedure: MINOR REMOVAL PORT-A-CATH;  Surgeon: Mavis Anes, MD;  Location: [facility];  Service: General;  Laterality: N/A;   PORTACATH PLACEMENT N/A 03/07/2020  Procedure: INSERTION PORT-A-CATH;  Surgeon: Aron Shoulders, MD;  Location: [facility] OR;  Service: General;  Laterality: Left     Current Meds  Medication Sig   hydrOXYzine  (VISTARIL ) 25 MG capsule Take 1 capsule (25 mg total) by mouth every 8 (eight) hours as needed for anxiety.     Allergies:   Doxycycline , Gadavist  [gadobutrol ], and Metronidazole    ROS:   Please see the history of present illness. All other systems reviewed and are negative.   Labs/Other Tests and Data  Reviewed:    Recent Labs: 05/29/2024: ALT 16; BUN 9; Creatinine, Ser 0.76; Hemoglobin 13.7; Platelets 265; Potassium 3.9; Sodium 136; TSH 3.605   Recent Lipid Panel Lab Results  Component Value Date/Time   CHOL 144 06/17/2021 11:24 AM   CHOL 147 09/02/2020 10:09 AM   TRIG 86 06/17/2021 11:24 AM   HDL 29 (L) 06/17/2021 11:24 AM   HDL 29 (L) 09/02/2020 10:09 AM   CHOLHDL 5.0 06/17/2021 11:24 AM   LDLCALC 98 06/17/2021 11:24 AM   LDLCALC 104 (H) 09/02/2020 10:09 AM    Wt Readings from Last 3 Encounters:  06/07/24 115 lb (52.2 kg)  05/15/24 112 lb 6.4 oz (51 kg)  02/16/24 111 lb (50.3 kg)     Objective:    Vital Signs:  There were no vitals taken for this visit.   VITAL SIGNS:  reviewed GEN:  no acute distress EYES:  sclerae anicteric, EOMI - Extraocular Movements Intact RESPIRATORY:  normal respiratory effort, symmetric expansion NEURO:  alert and oriented x 3, no obvious focal deficit PSYCH:  Anxious.  ASSESSMENT & PLAN:    Moderate episode of recurrent major depressive disorder (HCC) Better controlled, improved with Effexor  150 mg QD and Vraylar  1.5 mg QD Effexor  can have limited benefit with cluster headache and neuropathy as well Has tried Cymbalta  and Elavil, but did not tolerate them Has tried SSRI in the past without much benefit  GAD (generalized anxiety disorder) Has anxiety and easy irritability On Effexor  and Vraylar  for MDD Added hydroxyzine  as needed for anxiety/insomnia Advised to perform simple relaxation techniques Referred to [facility] therapy  Chronic migraine without aura without status migrainosus, not intractable On Emgality  Followed by Neurology  I discussed the assessment and treatment plan with the patient. The patient was provided an opportunity to ask questions, and all were answered. The patient agreed with the plan and demonstrated an understanding of the instructions.   The patient was advised to call back or seek an in-person evaluation if  the symptoms worsen or if the condition fails to improve as anticipated.  The above assessment and management plan was discussed with the patient. The patient verbalized understanding of and has agreed to the management plan.   Medication Adjustments/Labs and Tests Ordered: Current medicines are reviewed at length with the patient today.  Concerns regarding medicines are outlined above.   Tests Ordered: Orders Placed This Encounter  Procedures   Ambulatory referral to Psychology    Medication Changes: Meds ordered this encounter  Medications   hydrOXYzine  (VISTARIL ) 25 MG capsule    Sig: Take 1 capsule (25 mg total) by mouth every 8 (eight) hours as needed for anxiety.    Dispense:  60 capsule    Refill:  2     Note: This dictation was prepared with Dragon dictation along with smaller phrase technology. Similar sounding words can be transcribed inadequately or may not be corrected upon review. Any transcriptional errors that result from this process are unintentional.  Disposition:  Follow up  Signed, Suzzane MARLA Blanch, MD  07/19/2024 1:58 PM     [facility]

## 2024-07-19 NOTE — Patient Instructions (Signed)
 Please take hydroxyzine  as needed for anxiety.  Please continue to take medications as prescribed.  Please continue to follow low salt diet and perform moderate exercise/walking at least 150 mins/week.

## 2024-07-19 NOTE — Assessment & Plan Note (Signed)
 On Emgality  Followed by Neurology

## 2024-07-26 ENCOUNTER — Encounter: Admitting: Obstetrics & Gynecology

## 2024-08-01 ENCOUNTER — Other Ambulatory Visit: Payer: Self-pay

## 2024-08-01 DIAGNOSIS — N200 Calculus of kidney: Secondary | ICD-10-CM

## 2024-08-02 ENCOUNTER — Encounter: Admitting: Adult Health

## 2024-08-02 ENCOUNTER — Ambulatory Visit: Admitting: Urology

## 2024-08-02 ENCOUNTER — Ambulatory Visit (HOSPITAL_COMMUNITY)
Admission: RE | Admit: 2024-08-02 | Discharge: 2024-08-02 | Disposition: A | Discharge status: Home / Self Care | Source: Ambulatory Visit | Attending: Urology | Admitting: Urology

## 2024-08-02 ENCOUNTER — Encounter: Payer: Self-pay | Admitting: Urology

## 2024-08-02 VITALS — BP 99/63 | HR 73

## 2024-08-02 DIAGNOSIS — N3281 Overactive bladder: Secondary | ICD-10-CM

## 2024-08-02 DIAGNOSIS — R3129 Other microscopic hematuria: Secondary | ICD-10-CM | POA: Insufficient documentation

## 2024-08-02 DIAGNOSIS — N200 Calculus of kidney: Secondary | ICD-10-CM | POA: Diagnosis not present

## 2024-08-02 DIAGNOSIS — R3915 Urgency of urination: Secondary | ICD-10-CM

## 2024-08-02 MED ORDER — MIRABEGRON ER 25 MG PO TB24: 25.0000 mg | ORAL_TABLET | Freq: Every day | ORAL | 0 refills | Status: DC

## 2024-08-02 NOTE — Progress Notes (Signed)
 08/02/2024 3:20 PM   Donna Munoz 09, 1977 [id number]  Referring provider: Tobie Suzzane POUR, MD [address] [place],  [place] [address]  Followup nephrolithiasis   HPI: Ms Donna Munoz is a 48yo here for followup for nephrolithiasis and here for evaluation of urinary urgency/urge incontinence. No stone events since last visit. She denies nay flank pain. KUb shows no definitive calculi. She is having increased urinary urgency, frequency and incontinence since last visit. She is uses 6-7 pads per day. Nocturia 1-3x.    PMH: Past Medical History:  Diagnosis Date   Anemia    Anxiety    Depression    GERD (gastroesophageal reflux disease)    Headache    Shingles    Tobacco abuse 05/06/2020    Surgical History: Past Surgical History:  Procedure Laterality Date   AMPUTATION TOE Right 03/07/2020   Procedure: RIGHT THIRD TOE AMPUTATION;  Surgeon: Aron Shoulders, MD;  Location: [facility];  Service: General;  Laterality: Right;   BREAST LUMPECTOMY  age 48   CESAREAN SECTION  01/06/2006   CESAREAN SECTION  10/15/2016   DENTAL SURGERY  2019   INGUINAL HERNIA REPAIR Right 01/30/2020   Procedure: RIGHT INGUINAL HERNIA REPAIR WITH  MESH;  Surgeon: Aron Shoulders, MD;  Location: [facility];  Service: General;  Laterality: Right;   INSERTION OF MESH Right 01/30/2020   Procedure: INSERTION OF MESH;  Surgeon: Aron Shoulders, MD;  Location: [facility];  Service: General;  Laterality: Right;   IR CV LINE INJECTION  09/11/2020   IR IMAGING GUIDED PORT INSERTION  09/12/2020   IR REMOVAL TUN ACCESS W/ PORT W/O FL MOD SED  09/12/2020   IRRIGATION AND DEBRIDEMENT ABSCESS Right 03/07/2020   Procedure: Aspiration of Right Groin Seroma;  Surgeon: Aron Shoulders, MD;  Location: [facility] OR;  Service: General;  Laterality: Right;   MELANOMA EXCISION WITH SENTINEL LYMPH NODE BIOPSY Right 01/30/2020   Procedure: WIDE LOWER EXCISION RIGHT THIRD TOE MELANOMA EXCISION WITH SENTINEL LYMPH NODE BIOPSY;   Surgeon: Aron Shoulders, MD;  Location: [facility];  Service: General;  Laterality: Right;   PORT-A-CATH REMOVAL N/A 08/12/2022   Procedure: MINOR REMOVAL PORT-A-CATH;  Surgeon: Mavis Anes, MD;  Location: [facility];  Service: General;  Laterality: N/A;   PORTACATH PLACEMENT N/A 03/07/2020   Procedure: INSERTION PORT-A-CATH;  Surgeon: Aron Shoulders, MD;  Location: [facility];  Service: General;  Laterality: Left    Home Medications:  Allergies as of 08/02/2024       Reactions   Doxycycline  Rash   Patient developed diffuse pruritic rash involving her trunk 2 days after being given metronidazole  and doxycycline  to treat an H. pylori infection.  No other symptoms besides the rash.   Gadavist  [gadobutrol ] Nausea Only   Mild nausea but no vomiting after injection.  Ok after 5 minutes.   Metronidazole  Rash   Patient developed diffuse pruritic rash involving her trunk 2 days after being given metronidazole  and doxycycline  to treat an H. pylori infection.  No other symptoms besides the rash.        Medication List        Accurate as of August 02, 2024  3:20 PM. If you have any questions, ask your nurse or doctor.          cariprazine  1.5 MG capsule Commonly known as: Vraylar  Take 1 capsule (1.5 mg total) by mouth daily.   Emgality  120 MG/ML Soaj Generic drug: Galcanezumab -gnlm Inject 120 mg into the skin  every 30 (thirty) days.   gabapentin  100 MG capsule Commonly known as: NEURONTIN  Take 100 mg by mouth 3 (three) times daily as needed.   hydrOXYzine  25 MG capsule Commonly known as: VISTARIL  Take 1 capsule (25 mg total) by mouth every 8 (eight) hours as needed for anxiety.   ibuprofen  800 MG tablet Commonly known as: ADVIL  Take 800 mg by mouth every 8 (eight) hours as needed.   omeprazole  40 MG capsule Commonly known as: PRILOSEC TAKE 1 CAPSULE (40 MG TOTAL) BY MOUTH DAILY.   SUMAtriptan  100 MG tablet Commonly known as: IMITREX  Take 1 tablet (100 mg total) by  mouth once as needed for up to 1 dose. May repeat in 2 hours if headache persists or recurs.   venlafaxine  XR 150 MG 24 hr capsule Commonly known as: EFFEXOR -XR TAKE 1 CAPSULE BY MOUTH DAILY WITH BREAKFAST.   Vitamin D  (Ergocalciferol ) 1.25 MG (50000 UNIT) Caps capsule Commonly known as: DRISDOL  TAKE 1 CAPSULE (50,000 UNITS TOTAL) BY MOUTH EVERY 7 (SEVEN) DAYS        Allergies:  Allergies  Allergen Reactions   Doxycycline  Rash    Patient developed diffuse pruritic rash involving her trunk 2 days after being given metronidazole  and doxycycline  to treat an H. pylori infection.  No other symptoms besides the rash.   Gadavist  [Gadobutrol ] Nausea Only    Mild nausea but no vomiting after injection.  Ok after 5 minutes.   Metronidazole  Rash    Patient developed diffuse pruritic rash involving her trunk 2 days after being given metronidazole  and doxycycline  to treat an H. pylori infection.  No other symptoms besides the rash.    Family History: Family History  Problem Relation Age of Onset   Heart disease Mother    Cancer Mother    Ovarian cysts Sister    Healthy Sister    Healthy Sister    Healthy Brother    Cancer Maternal Grandmother    Heart attack Maternal Grandmother    Cancer Maternal Grandfather    Cancer Paternal Grandmother    Colon cancer Paternal Grandfather    Other Paternal Grandfather        brain tumor in his 53s. had metal plate inserted   Psoriasis Daughter    Healthy Son    Esophageal cancer Neg Hx    Rectal cancer Neg Hx    Stomach cancer Neg Hx    Migraines Neg Hx     Social History:  reports that she has been smoking cigarettes. She has a 29 pack-year smoking history. She has never used smokeless tobacco. She reports current drug use. Frequency: 2.00 times per week. Drug: Marijuana. She reports that she does not drink alcohol.  ROS: All other review of systems were reviewed and are negative except what is noted above in HPI  Physical Exam: BP  99/63   Pulse 73   Constitutional:  Alert and oriented, No acute distress. HEENT: [place] AT, moist mucus membranes.  Trachea midline, no masses. Cardiovascular: No clubbing, cyanosis, or edema. Respiratory: Normal respiratory effort, no increased work of breathing. GI: Abdomen is soft, nontender, nondistended, no abdominal masses GU: No CVA tenderness.  Lymph: No cervical or inguinal lymphadenopathy. Skin: No rashes, bruises or suspicious lesions. Neurologic: Grossly intact, no focal deficits, moving all 4 extremities. Psychiatric: Normal mood and affect.  Laboratory Data: Lab Results  Component Value Date   WBC 4.6 05/29/2024   HGB 13.7 05/29/2024   HCT 41.0 05/29/2024   MCV 93.8 05/29/2024  PLT 265 05/29/2024    Lab Results  Component Value Date   CREATININE 0.76 05/29/2024    No results found for: PSA  No results found for: TESTOSTERONE  Lab Results  Component Value Date   HGBA1C 5.2 09/13/2023    Urinalysis    Component Value Date/Time   APPEARANCEUR Clear 12/15/2023 1541   GLUCOSEU Negative 12/15/2023 1541   BILIRUBINUR Negative 12/15/2023 1541   PROTEINUR Negative 12/15/2023 1541   NITRITE Negative 12/15/2023 1541   LEUKOCYTESUR Negative 12/15/2023 1541    Lab Results  Component Value Date   LABMICR Comment 12/15/2023   WBCUA 6-10 (A) 10/18/2023   LABEPIT >10 (A) 10/18/2023   BACTERIA None seen 10/18/2023    Pertinent Imaging: KUb today: Images reviewed and discussed with the patient  Results for orders placed during the hospital encounter of 06/30/23  DG Abd 1 View  Narrative CLINICAL DATA:  Left kidney stone  EXAM: ABDOMEN - 1 VIEW  COMPARISON:  None Available.  FINDINGS: The bowel gas pattern is normal. No radio-opaque calculi or other significant radiographic abnormality are seen.  IMPRESSION: Negative.   Electronically Signed By: Fonda Field M.D. On: 07/04/2023 01:41  No results found for this or any previous  visit.  No results found for this or any previous visit.  No results found for this or any previous visit.  Results for orders placed during the hospital encounter of 06/30/23  US  RENAL  Narrative CLINICAL DATA:  Microscopic hematuria  EXAM: RENAL / URINARY TRACT ULTRASOUND COMPLETE  COMPARISON:  CT C [facility] 05/18/2023  FINDINGS: Right Kidney:  Renal measurements: 11.5 x 3.9 x 6.3 cm = volume: 149 mL. Echogenicity within normal limits. No mass or hydronephrosis visualized.  Left Kidney:  Renal measurements: 11.1 x 5.5 x 5.5 cm = volume: 177.1 mL. Normal renal cortical thickness and echogenicity. No hydronephrosis. There is a 0.9 cm simple cyst. No imaging follow-up needed. There is a 2 mm stone inferior pole.  Bladder:  Appears normal for degree of bladder distention.  Other:  None.  IMPRESSION: 1. No hydronephrosis. 2. Small left renal stone.   Electronically Signed By: Bard Moats M.D. On: 06/30/2023 20:12  No results found for this or any previous visit.  Results for orders placed during the hospital encounter of 12/07/23  CT HEMATURIA WORKUP  Narrative CLINICAL DATA:  Microhematuria.  EXAM: CT ABDOMEN AND PELVIS WITHOUT AND WITH CONTRAST  TECHNIQUE: Multidetector CT imaging of the abdomen and pelvis was performed following the standard protocol before and following the bolus administration of intravenous contrast.  RADIATION DOSE REDUCTION: This exam was performed according to the departmental dose-optimization program which includes automated exposure control, adjustment of the mA and/or kV according to patient size and/or use of iterative reconstruction technique.  CONTRAST:  OMNIPAQUE  IOHEXOL  300 MG/ML  SOLN  COMPARISON:  CT scan abdomen and pelvis from 05/18/2023.  FINDINGS: Lower chest: There are subpleural atelectatic changes in the visualized lung bases. No overt consolidation. No pleural effusion. The heart is normal in size.  No pericardial effusion.  Hepatobiliary: The liver is normal in size. Non-cirrhotic configuration. No suspicious mass. There is a persistent geographic, small, hypoattenuating area along the falciform ligament attachment site, which even though incompletely characterized on the current examination, favored to represent focal fatty infiltration. No intrahepatic or extrahepatic bile duct dilation. No calcified gallstones. Normal gallbladder wall thickness. No pericholecystic inflammatory changes.  Pancreas: Unremarkable. No pancreatic ductal dilatation or surrounding inflammatory changes.  Spleen: Within  normal limits. No focal lesion.  Adrenals/Urinary Tract: Adrenal glands are unremarkable.  Non-contrast images: There is a 3 mm nonobstructing calculus in the left kidney lower pole calyx. No other radiopaque urinary tract calculi.  Kidneys: Symmetric enhancement. No suspicious renal mass. There is a subcentimeter sized simple cyst in the left kidney upper pole, posteriorly.  Urinary Tract Opacification: Suboptimal. Only portions of bilateral ureters are opacified.  Collecting Systems and opacified ureters: No filling defects, masses, strictures, or areas of abnormal dilatation.  Urinary Bladder: No filling defect, wall thickening, or mass.  Stomach/Bowel: No disproportionate dilation of the small or large bowel loops. No evidence of abnormal bowel wall thickening or inflammatory changes. The appendix was not visualized; however there is no acute inflammatory process in the right lower quadrant.  Vascular/Lymphatic: No ascites or pneumoperitoneum. No abdominal or pelvic lymphadenopathy, by size criteria. No aneurysmal dilation of the major abdominal arteries. There are mild peripheral atherosclerotic vascular calcifications of the aorta and its major branches.  Reproductive: The uterus is unremarkable. No large adnexal mass.  Other: The visualized soft tissues and  abdominal wall are unremarkable.  Musculoskeletal: No suspicious osseous lesions.  IMPRESSION: 1. There is a 3 mm nonobstructing calculus in the left kidney lower pole calyx. No other nephroureterolithiasis on either side. No obstructive uropathy on either side. 2. No suspicious renal, ureteric or urinary bladder mass. 3. Multiple other nonacute observations, as described above.  Aortic Atherosclerosis (ICD10-I70.0).   Electronically Signed By: Ree Molt M.D. On: 12/16/2023 13:20  No results found for this or any previous visit.   Assessment & Plan:    1. Kidney stones (Primary) Followup 1 year with [facility]  2. OAB -decrease caffeine intake -we will trial mirabegron  25mg  daily   No follow-ups on file.  Donna Clara, MD  [facility]

## 2024-08-02 NOTE — Patient Instructions (Signed)

## 2024-08-07 ENCOUNTER — Other Ambulatory Visit: Payer: Self-pay | Admitting: Internal Medicine

## 2024-08-07 DIAGNOSIS — E559 Vitamin D deficiency, unspecified: Secondary | ICD-10-CM

## 2024-08-10 ENCOUNTER — Telehealth: Payer: Self-pay

## 2024-08-10 NOTE — Telephone Encounter (Signed)
 Medication prior authorization request received.  Completed PA request through cover my meds for drug Mirabegron  ER. KEY: [id number]  Approved: Pending

## 2024-08-11 ENCOUNTER — Telehealth: Admitting: Neurology

## 2024-08-14 ENCOUNTER — Other Ambulatory Visit: Payer: Self-pay

## 2024-08-14 DIAGNOSIS — R3915 Urgency of urination: Secondary | ICD-10-CM

## 2024-08-14 MED ORDER — MIRABEGRON ER 25 MG PO TB24: 25.0000 mg | ORAL_TABLET | Freq: Every day | ORAL | 0 refills | Status: DC

## 2024-08-15 ENCOUNTER — Ambulatory Visit (HOSPITAL_COMMUNITY): Admitting: Clinical

## 2024-08-15 ENCOUNTER — Encounter (HOSPITAL_COMMUNITY): Payer: Self-pay

## 2024-08-15 DIAGNOSIS — F411 Generalized anxiety disorder: Secondary | ICD-10-CM | POA: Diagnosis not present

## 2024-08-15 DIAGNOSIS — F332 Major depressive disorder, recurrent severe without psychotic features: Secondary | ICD-10-CM
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
## 2024-08-22 ENCOUNTER — Telehealth: Payer: Self-pay

## 2024-08-22 NOTE — Telephone Encounter (Signed)
-----   Message from Titanic H sent at 08/22/2024  2:43 PM EDT ----- Please review.

## 2024-08-22 NOTE — Telephone Encounter (Signed)
 PA denial due to not tried and failed medication

## 2024-09-04 ENCOUNTER — Ambulatory Visit (HOSPITAL_COMMUNITY): Admitting: Clinical

## 2024-09-04 DIAGNOSIS — F332 Major depressive disorder, recurrent severe without psychotic features: Secondary | ICD-10-CM

## 2024-09-04 DIAGNOSIS — F411 Generalized anxiety disorder: Secondary | ICD-10-CM

## 2024-09-04 NOTE — Progress Notes (Signed)
 Virtual Visit via Video Note  I connected with Donna Munoz on 09/04/24 at 11:00 AM EDT by a video enabled telemedicine application and verified that I am speaking with the correct person using two identifiers.  Location: Patient: home Provider: office   I discussed the limitations of evaluation and management by telemedicine and the availability of in person appointments. The patient expressed understanding and agreed to proceed.   THERAPIST PROGRESS NOTE   Session Time: 11:00 AM-11:30 AM   Participation Level: Active   Behavioral Response: CasualAlertDepressed   Type of Therapy: Individual Therapy   Treatment Goals addressed: Anger Mood Management and Coping   Interventions: CBT, Motivational Interviewing, Solution Focused and Strength-based   Summary: Donna Munoz is a 48 y.o. female who presents with MDD Recurrent Severe without psych feat / GAD. The OPT therapist worked with the patient for her scheduled OPT treatment.. The patient spoke about current stressors including fiance having upcoming neck surgery, getting her child ready for school, and struggle with response to current med therapy. The patient noted despite this she is still working to be active and  completing tasks at home during the day The patient in the session was engaged and work in tour manager about her triggers and symptoms over the past few weeks through October. The OPT therapist worked with the patient on  coping skills including change of environment more frequently with milder weather  for the Fall season. The patient work with the OPT therapist utilized Engineer, Manufacturing Systems Therapy through cognitive restructuring.The OPT therapist worked with the patient utilizing the in my control vs not in my control exercise and work to identify automatic negative thoughts. The OPT therapist worked with the patient  on coping skills for both indoor and outdoor. The patient  spoke about intent to overview her  med therapy at her next appointment and give feedback about her concern its not working as well to manage her MH symptoms.The OPT therapist overviewed with the patient upcoming appointments listed in the patient MyChart.     Suicidal/Homicidal: Nowithout intent/plan   Therapist Response: The OPT therapist worked with the patient for the patients scheduled session. The patient was engaged in her session and gave feedback in relation to triggers, symptoms, and behavior responses over the past few weeks. The patient spoke about current stressors including fiance having upcoming neck surgery, getting her child ready for school, and struggle with response to current med therapy. The patient noted despite this she is still working to be active and  completing tasks at home during the day. The patient spoke about her plans for the current week with involvement with her child for the upcoming  Halloween holiday. .  The OPT therapist continued to encourage the patient to get out of the home/ change environment when possible to reduce isolating in her room and excessive sleeping.. The OPT therapist worked with the patient in review of basic need areas and coping strategies for inside/outside the home.The patient continues to work with her physical health providers and will be overviewing her medication response at her next med therapy appointment. .   Plan: Return again in 3/4 weeks.   Diagnosis:      Axis I:  MDD Recurrent Severe without psych feat / GAD                            Axis II: No diagnosis   Collaboration of Care: Collaboration of care in  review of patient health care and ongoing treatment work with her health providers and psychiatrist Shavon Rankin   Patient/Guardian was advised Release of Information must be obtained prior to any record release in order to collaborate their care with an outside provider. Patient/Guardian was advised if they have not already done so to contact the registration  department to sign all necessary forms in order for us  to release information regarding their care.    Consent: Patient/Guardian gives verbal consent for treatment and assignment of benefits for services provided during this visit. Patient/Guardian expressed understanding and agreed to proceed.     I discussed the assessment and treatment plan with the patient. The patient was provided an opportunity to ask questions and all were answered. The patient agreed with the plan and demonstrated an understanding of the instructions.   The patient was advised to call back or seek an in-person evaluation if the symptoms worsen or if the condition fails to improve as anticipated.   I provided 30 minutes of non-face-to-face time during this encounter.   Jerel [name] Pepper, LCSW   09/04/2024

## 2024-09-04 NOTE — Progress Notes (Unsigned)
 Patient: Donna Munoz Date of Birth: [birth year]  Reason for Visit: Follow up History from: Patient Primary Neurologist: Ahern/Dohmeier  Virtual Visit via Video Note  I connected with Donna Munoz on 09/05/24 at  8:30 AM EDT by a video enabled telemedicine application and verified that I am speaking with the correct person using two identifiers.  Location: Patient: at her home Provider: in the office    I discussed the limitations of evaluation and management by telemedicine and the availability of in person appointments. The patient expressed understanding and agreed to proceed.  ASSESSMENT AND PLAN 48 y.o. year old female   Chronic migraine headache  Cluster headache   - Doing great! > 75 % improvement in migraines with Emgality   - Continue Emgality  120 mg monthly injection for migraine prevention - Continue Imitrex  100 mg as needed for acute migraine - Recommend smoking cessation - Needs evaluation for chest pain, contact PCP, likely needs ER or urgent care  - Follow-up in 1 year or sooner if needed  HISTORY OF PRESENT ILLNESS: Today 09/05/24 In summary last visit with Dr. Ines in April 2025 for migraines, advised to stop smoking, check MRI brain, started Imitrex , started Emgality  for prevention. MRI brain showed Scattered T2/FLAIR hyperintense foci in the cerebral hemispheres, predominantly in the subcortical white matter. Likely from aging, migraines, vascular risk factors.   Here today VV, remains on Emgality . Much better with Emgality ! Prior reporting 3-4  migraines a week, now 3-4 every 2-3 weeks, if milder, less intense. Doesn't last for days at a time. Imitrex  works great for her within 30-45 minutes! Is still smoking, smokes less than a pack a day. Has been having chest pain intermittent for 2 months.   HISTORY  02/10/24 Dr. Ines HPI:  Donna Munoz is a 48 y.o. female here as requested by Tobie Suzzane POUR, MD for cluster headaches. has Melanoma of skin (HCC); IDA (iron   deficiency anemia); Tobacco abuse; Hypothyroidism; Gastroesophageal reflux disease with esophagitis without hemorrhage; Moderate episode of recurrent major depressive disorder (HCC); Peripheral polyneuropathy; Malignant melanoma of skin of toe, right (HCC); Kidney stones; Vitamin D  deficiency; Vitamin B12 deficiency; Helicobacter pylori gastritis; Intractable chronic cluster headache; and Chronic migraine without aura without status migrainosus, not intractable on their problem list.   She has had heachache for years ongoing in 2020 diagnosed with stage 3 melanoma from a frckle on her toe they amputated the toe and after that her headaches worsened daily now she has 3-4 a week and they last for days last for 9 days. Always on the left side of the face, affects her left eye, left side of face feels aweful and numb, feels like somoene is jabbing something in the eye steady pain for up to 5 hours, eye waters just on the left side, severe, light sensitivity, she can;t stand noise, light she has nausea and vomiting, sleeping helps, nausea, can be pulsating/pounding/throbbing, turning her eyes will hurt, always on the left side, continuous, she has had headaches since the age of 62 after the age of 50, no Fhx of headaches, she had an MRi in 2023 which did show any etilogy, her uncle had headaches and quit smoking. At least 12 migraine or cluster headaches a month and >15 total headache days a month the pain can be continuous for 9 days severe, no medication overuse, no aura. She can't eat. Just sleep helps. Usually after waking up and getting up in themorning and moving then the headaches start, unknown triggers. Symptoms have  been worsening since 2023 when last had MRI, left eye vision changes, no jaw pain or fever or associated neck pain. No other focal neurologic deficits, associated symptoms, inciting events or modifiable factors.   Reviewed notes, labs and imaging from outside physicians, which showed:    Medications tried that can be used in management: amitriptyline, cymbalta , gabapentin , effexor , Cannot take verapamil or propranolol or other blood pressure medications due to very low BP 103 systolic today and sequelae of hypotension, Topiramate contraindicated due to kidney stones, Lithium contraindicated due to psychiatric disease which may be worsened by the addition of other psychiatric medications such as lithium she has already tried other SSRIs and SNRIs hesiate to add Lithium. Aimovig contraindicated due to constipation.  REVIEW OF SYSTEMS: Out of a complete 14 system review of symptoms, the patient complains only of the following symptoms, and all other reviewed systems are negative.  See HPI  ALLERGIES: Allergies  Allergen Reactions   Doxycycline  Rash    Patient developed diffuse pruritic rash involving her trunk 2 days after being given metronidazole  and doxycycline  to treat an H. pylori infection.  No other symptoms besides the rash.   Gadavist  [Gadobutrol ] Nausea Only    Mild nausea but no vomiting after injection.  Ok after 5 minutes.   Metronidazole  Rash    Patient developed diffuse pruritic rash involving her trunk 2 days after being given metronidazole  and doxycycline  to treat an H. pylori infection.  No other symptoms besides the rash.    HOME MEDICATIONS: Outpatient Medications Prior to Visit  Medication Sig Dispense Refill   cariprazine  (VRAYLAR ) 1.5 MG capsule Take 1 capsule (1.5 mg total) by mouth daily. 30 capsule 2   gabapentin  (NEURONTIN ) 100 MG capsule Take 100 mg by mouth 3 (three) times daily as needed.     Galcanezumab -gnlm (EMGALITY ) 120 MG/ML SOAJ Inject 120 mg into the skin every 30 (thirty) days. 1.12 mL 11   hydrOXYzine  (VISTARIL ) 25 MG capsule Take 1 capsule (25 mg total) by mouth every 8 (eight) hours as needed for anxiety. 60 capsule 2   ibuprofen  (ADVIL ) 800 MG tablet Take 800 mg by mouth every 8 (eight) hours as needed.     mirabegron  ER (MYRBETRIQ )  25 MG TB24 tablet Take 1 tablet (25 mg total) by mouth daily. 30 tablet 0   omeprazole  (PRILOSEC) 40 MG capsule TAKE 1 CAPSULE (40 MG TOTAL) BY MOUTH DAILY. 90 capsule 1   SUMAtriptan  (IMITREX ) 100 MG tablet Take 1 tablet (100 mg total) by mouth once as needed for up to 1 dose. May repeat in 2 hours if headache persists or recurs. 12 tablet 12   venlafaxine  XR (EFFEXOR -XR) 150 MG 24 hr capsule TAKE 1 CAPSULE BY MOUTH DAILY WITH BREAKFAST. 90 capsule 1   Vitamin D , Ergocalciferol , (DRISDOL ) 1.25 MG (50000 UNIT) CAPS capsule TAKE 1 CAPSULE (50,000 UNITS TOTAL) BY MOUTH EVERY 7 (SEVEN) DAYS 12 capsule 1   No facility-administered medications prior to visit.    PAST MEDICAL HISTORY: Past Medical History:  Diagnosis Date   Anemia    Anxiety    Depression    GERD (gastroesophageal reflux disease)    Headache    Shingles    Tobacco abuse 05/06/2020    PAST SURGICAL HISTORY: Past Surgical History:  Procedure Laterality Date   AMPUTATION TOE Right 03/07/2020   Procedure: RIGHT THIRD TOE AMPUTATION;  Surgeon: Aron Shoulders, MD;  Location: [facility];  Service: General;  Laterality: Right;   BREAST LUMPECTOMY  age 7  CESAREAN SECTION  01/06/2006   CESAREAN SECTION  10/15/2016   DENTAL SURGERY  2019   INGUINAL HERNIA REPAIR Right 01/30/2020   Procedure: RIGHT INGUINAL HERNIA REPAIR WITH  MESH;  Surgeon: Aron Shoulders, MD;  Location: [facility];  Service: General;  Laterality: Right;   INSERTION OF MESH Right 01/30/2020   Procedure: INSERTION OF MESH;  Surgeon: Aron Shoulders, MD;  Location: [facility];  Service: General;  Laterality: Right;   IR CV LINE INJECTION  09/11/2020   IR IMAGING GUIDED PORT INSERTION  09/12/2020   IR REMOVAL TUN ACCESS W/ PORT W/O FL MOD SED  09/12/2020   IRRIGATION AND DEBRIDEMENT ABSCESS Right 03/07/2020   Procedure: Aspiration of Right Groin Seroma;  Surgeon: Aron Shoulders, MD;  Location: [facility];  Service: General;  Laterality: Right;    MELANOMA EXCISION WITH SENTINEL LYMPH NODE BIOPSY Right 01/30/2020   Procedure: WIDE LOWER EXCISION RIGHT THIRD TOE MELANOMA EXCISION WITH SENTINEL LYMPH NODE BIOPSY;  Surgeon: Aron Shoulders, MD;  Location: [facility];  Service: General;  Laterality: Right;   PORT-A-CATH REMOVAL N/A 08/12/2022   Procedure: MINOR REMOVAL PORT-A-CATH;  Surgeon: Mavis Anes, MD;  Location: [facility];  Service: General;  Laterality: N/A;   PORTACATH PLACEMENT N/A 03/07/2020   Procedure: INSERTION PORT-A-CATH;  Surgeon: Aron Shoulders, MD;  Location: [facility];  Service: General;  Laterality: Left    FAMILY HISTORY: Family History  Problem Relation Age of Onset   Heart disease Mother    Cancer Mother    Ovarian cysts Sister    Healthy Sister    Healthy Sister    Healthy Brother    Cancer Maternal Grandmother    Heart attack Maternal Grandmother    Cancer Maternal Grandfather    Cancer Paternal Grandmother    Colon cancer Paternal Grandfather    Other Paternal Grandfather        brain tumor in his 69s. had metal plate inserted   Psoriasis Daughter    Healthy Son    Esophageal cancer Neg Hx    Rectal cancer Neg Hx    Stomach cancer Neg Hx    Migraines Neg Hx     SOCIAL HISTORY: Social History   Socioeconomic History   Marital status: Legally Separated    Spouse name: Not on file   Number of children: 2   Years of education: Not on file   Highest education level: Not on file  Occupational History   Occupation: unemployed d/t COVID  Tobacco Use   Smoking status: Every Day    Current packs/day: 1.00    Average packs/day: 1 pack/day for 29.0 years (29.0 ttl pk-yrs)    Types: Cigarettes   Smokeless tobacco: Never  Vaping Use   Vaping status: Never Used  Substance and Sexual Activity   Alcohol use: No   Drug use: Yes    Frequency: 2.0 times per week    Types: Marijuana    Comment: hardly ever, maybe 3x a month now   Sexual activity: Yes  Other Topics Concern   Not on file   Social History Narrative   Right handed   Caffeine: 6 pack mtn dew cans per day   Lives with others   Social Drivers of Health   Financial Resource Strain: High Risk (12/08/2019)   Overall Financial Resource Strain (CARDIA)    Difficulty of Paying Living Expenses: Hard  Food Insecurity: No Food Insecurity (12/08/2019)   Hunger Vital Sign  Worried About Programme Researcher, Broadcasting/film/video in the Last Year: Never true    Ran Out of Food in the Last Year: Never true  Transportation Needs: No Transportation Needs (12/26/2020)   PRAPARE - Administrator, Civil Service (Medical): No    Lack of Transportation (Non-Medical): No  Physical Activity: Inactive (12/26/2020)   Exercise Vital Sign    Days of Exercise per Week: 0 days    Minutes of Exercise per Session: 0 min  Stress: Stress Concern Present (12/08/2019)   [employer] of Occupational Health - Occupational Stress Questionnaire    Feeling of Stress : Very much  Social Connections: Unknown (06/26/2023)   Received from [facility]   Social Network    Social Network: Not on file  Intimate Partner Violence: Not At Risk (06/26/2023)   Received from [facility]   HITS    Over the last 12 months how often did your partner physically hurt you?: Never    Over the last 12 months how often did your partner insult you or talk down to you?: Never    Over the last 12 months how often did your partner threaten you with physical harm?: Never    Over the last 12 months how often did your partner scream or curse at you?: Never   PHYSICAL EXAM  There were no vitals filed for this visit. There is no height or weight on file to calculate BMI.  Virtual visit  DIAGNOSTIC DATA (LABS, IMAGING, TESTING) - I reviewed patient records, labs, notes, testing and imaging myself where available.  Lab Results  Component Value Date   WBC 4.6 05/29/2024   HGB 13.7 05/29/2024   HCT 41.0 05/29/2024   MCV 93.8 05/29/2024   PLT 265 05/29/2024       Component Value Date/Time   NA 136 05/29/2024 0900   NA 140 10/18/2023 1430   K 3.9 05/29/2024 0900   CL 104 05/29/2024 0900   CO2 24 05/29/2024 0900   GLUCOSE 119 (H) 05/29/2024 0900   BUN 9 05/29/2024 0900   BUN 6 10/18/2023 1430   CREATININE 0.76 05/29/2024 0900   CALCIUM 9.5 05/29/2024 0900   PROT 7.5 05/29/2024 0900   ALBUMIN 3.9 05/29/2024 0900   AST 24 05/29/2024 0900   ALT 16 05/29/2024 0900   ALKPHOS 77 05/29/2024 0900   BILITOT 0.6 05/29/2024 0900   GFRNONAA >60 05/29/2024 0900   GFRAA >60 07/30/2020 1231   Lab Results  Component Value Date   CHOL 144 06/17/2021   HDL 29 (L) 06/17/2021   LDLCALC 98 06/17/2021   TRIG 86 06/17/2021   CHOLHDL 5.0 06/17/2021   Lab Results  Component Value Date   HGBA1C 5.2 09/13/2023   Lab Results  Component Value Date   VITAMINB12 177 (L) 09/13/2023   Lab Results  Component Value Date   TSH 3.605 05/29/2024   Lauraine Born, AGNP-C, DNP 09/05/2024, 8:28 AM [facility] [address], [address] [place], [place] [address] [phone]

## 2024-09-05 ENCOUNTER — Other Ambulatory Visit: Payer: Self-pay | Admitting: Urology

## 2024-09-05 ENCOUNTER — Telehealth (INDEPENDENT_AMBULATORY_CARE_PROVIDER_SITE_OTHER): Admitting: Neurology

## 2024-09-05 DIAGNOSIS — G43709 Chronic migraine without aura, not intractable, without status migrainosus: Secondary | ICD-10-CM | POA: Diagnosis not present

## 2024-09-05 DIAGNOSIS — R3915 Urgency of urination: Secondary | ICD-10-CM

## 2024-09-05 MED ORDER — SUMATRIPTAN SUCCINATE 100 MG PO TABS: 100.0000 mg | ORAL_TABLET | Freq: Once | ORAL | 12 refills | Status: AC | PRN

## 2024-09-05 MED ORDER — EMGALITY 120 MG/ML ~~LOC~~ SOAJ: 120.0000 mg | SUBCUTANEOUS | 11 refills | Status: AC

## 2024-09-05 NOTE — Patient Instructions (Signed)
 Great to meet you today! Continue current medications for migraine management Follow-up in 1 year or sooner if needed.  Call for increase in headache.  Meds ordered this encounter  Medications   Galcanezumab -gnlm (EMGALITY ) 120 MG/ML SOAJ    Sig: Inject 120 mg into the skin every 30 (thirty) days.    Dispense:  1.12 mL    Refill:  11   SUMAtriptan  (IMITREX ) 100 MG tablet    Sig: Take 1 tablet (100 mg total) by mouth once as needed for up to 1 dose. May repeat in 2 hours if headache persists or recurs.    Dispense:  12 tablet    Refill:  12

## 2024-10-03 ENCOUNTER — Ambulatory Visit (HOSPITAL_COMMUNITY): Admitting: Clinical

## 2024-10-03 DIAGNOSIS — F332 Major depressive disorder, recurrent severe without psychotic features: Secondary | ICD-10-CM

## 2024-10-03 DIAGNOSIS — F411 Generalized anxiety disorder: Secondary | ICD-10-CM

## 2024-10-03 NOTE — Progress Notes (Signed)
 Virtual Visit via Video Note   I connected with Donna Munoz on 10/03/24 at 11:00 AM EDT by a video enabled telemedicine application and verified that I am speaking with the correct person using two identifiers.   Location: Patient: home Provider: office   I discussed the limitations of evaluation and management by telemedicine and the availability of in person appointments. The patient expressed understanding and agreed to proceed.     THERAPIST PROGRESS NOTE   Session Time: 11:00 AM-11:30 AM   Participation Level: Active   Behavioral Response: CasualAlertDepressed   Type of Therapy: Individual Therapy   Treatment Goals addressed: Anger Mood Management and Coping   Interventions: CBT, Motivational Interviewing, Solution Focused and Strength-based   Summary: Donna Munoz is a 48 y.o. female who presents with MDD Recurrent Severe without psych feat / GAD. The OPT therapist worked with the patient for her scheduled OPT treatment.. The patient spoke about current stressors including fiance recovering neck surgery, getting her child ready for school, and struggle with response to current med therapy The patient noted despite this she is still working to be active and  completing tasks at home during the day The patient in the session was engaged and work in tour manager about her triggers and symptoms over the past few weeks through November. The patient and her fiance family will be celebrating Dec 7th a combination of Thanksgiving and Christmas in [place]. The OPT therapist worked with the patient on  coping skills including change of environment more frequently with milder weather  for the Fall season. The patient work with the OPT therapist utilized Engineer, Manufacturing Systems Therapy through cognitive restructuring.The OPT therapist worked with the patient utilizing the in my control vs not in my control exercise and work to identify automatic negative thoughts. The OPT therapist  worked with the patient  on coping skills for both indoor and outdoor. The patient spoke about intent to overview her med therapy at her next appointment and give feedback about her concern its not working as well to manage her MH symptom with upcoming appointment with PCP in December. The patient spoke about looking forward to going to [employer] as a tradition for her family each for the holidays.The OPT therapist overviewed with the patient upcoming appointments listed in the patient MyChart.     Suicidal/Homicidal: Nowithout intent/plan   Therapist Response: The OPT therapist worked with the patient for the patients scheduled session. The patient was engaged in her session and gave feedback in relation to triggers, symptoms, and behavior responses over the past few weeks. The patient spoke about current stressors including fiance recovery from recent neck surgery, getting her child ready for school and the schedule with holiday breaks, and struggle with response to current med therapy. The patient noted despite this she is still working to be active and  completing tasks at home during the day. The patient spoke about her plans for the holidays and going to [employer]. The OPT therapist continued to encourage the patient to get out of the home/ change environment when possible to reduce isolating in her room and excessive sleeping. The OPT therapist worked with the patient in review of basic need areas and coping strategies for inside/outside the home.The patient continues to work with her physical health providers and will be overviewing her medication response at her next med therapy appointment. With PCP on December 11th, 2025.   Plan: Return again in 3/4 weeks.  Diagnosis:      Axis I:  MDD Recurrent Severe without psych feat / GAD                            Axis II: No diagnosis   Collaboration of Care: Collaboration of care in review of patient health care  and ongoing treatment work with her health providers and psychiatrist Shavon Rankin   Patient/Guardian was advised Release of Information must be obtained prior to any record release in order to collaborate their care with an outside provider. Patient/Guardian was advised if they have not already done so to contact the registration department to sign all necessary forms in order for us  to release information regarding their care.    Consent: Patient/Guardian gives verbal consent for treatment and assignment of benefits for services provided during this visit. Patient/Guardian expressed understanding and agreed to proceed.     I discussed the assessment and treatment plan with the patient. The patient was provided an opportunity to ask questions and all were answered. The patient agreed with the plan and demonstrated an understanding of the instructions.   The patient was advised to call back or seek an in-person evaluation if the symptoms worsen or if the condition fails to improve as anticipated.   I provided 30 minutes of non-face-to-face time during this encounter.   Jerel [name] Pepper, LCSW   10/03/2024

## 2024-10-13 ENCOUNTER — Encounter (HOSPITAL_BASED_OUTPATIENT_CLINIC_OR_DEPARTMENT_OTHER): Payer: Self-pay | Admitting: General Surgery

## 2024-10-17 ENCOUNTER — Encounter: Admitting: Adult Health

## 2024-10-19 ENCOUNTER — Ambulatory Visit: Admitting: Internal Medicine

## 2024-10-19 ENCOUNTER — Encounter: Payer: Self-pay | Admitting: Internal Medicine

## 2024-10-19 VITALS — BP 95/64 | HR 86 | Ht 62.0 in | Wt 113.8 lb

## 2024-10-19 DIAGNOSIS — M549 Dorsalgia, unspecified: Secondary | ICD-10-CM | POA: Diagnosis not present

## 2024-10-19 DIAGNOSIS — E039 Hypothyroidism, unspecified: Secondary | ICD-10-CM | POA: Diagnosis not present

## 2024-10-19 DIAGNOSIS — F411 Generalized anxiety disorder: Secondary | ICD-10-CM | POA: Diagnosis not present

## 2024-10-19 DIAGNOSIS — K2100 Gastro-esophageal reflux disease with esophagitis, without bleeding: Secondary | ICD-10-CM

## 2024-10-19 DIAGNOSIS — G8929 Other chronic pain: Secondary | ICD-10-CM | POA: Insufficient documentation

## 2024-10-19 DIAGNOSIS — G43709 Chronic migraine without aura, not intractable, without status migrainosus: Secondary | ICD-10-CM | POA: Diagnosis not present

## 2024-10-19 DIAGNOSIS — F331 Major depressive disorder, recurrent, moderate: Secondary | ICD-10-CM | POA: Diagnosis not present

## 2024-10-19 DIAGNOSIS — Z0001 Encounter for general adult medical examination with abnormal findings: Secondary | ICD-10-CM | POA: Diagnosis not present

## 2024-10-19 DIAGNOSIS — Z1231 Encounter for screening mammogram for malignant neoplasm of breast: Secondary | ICD-10-CM | POA: Diagnosis not present

## 2024-10-19 MED ORDER — PREDNISONE 10 MG (21) PO TBPK: ORAL_TABLET | ORAL | 0 refills | Status: AC

## 2024-10-19 NOTE — Assessment & Plan Note (Addendum)
 Has anxiety and easy irritability, better controlled On Effexor  150 mg QD Has hydroxyzine  as needed for anxiety/insomnia Advised to perform simple relaxation techniques

## 2024-10-19 NOTE — Progress Notes (Addendum)
 Established Patient Office Visit  Subjective:  Patient ID: Donna Munoz, female    DOB: October 14, 1976  Age: 48 y.o. MRN: [id number]  CC:  Chief Complaint  Patient presents with   Follow-up    F/u   Back Pain    Reports mid back pain ongoing for over a year.    HPI Donna Munoz is a 48 y.o. 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   MDD and GAD: She has noticed improvement in apathy with Vraylar , but has stopped taking it since 10/25.  She takes Effexor  150 mg QD.  She had chronic insomnia, decreased appetite, decreased concentration and easy irritability, but have improved now.  She has noticed improvement with as needed hydroxyzine  for anxiety.  Her sleep has improved now. She has tried amitriptyline in the past, which caused drowsiness.  She was doing better with Cymbalta , but stopped taking it due to persistent nausea. Denies any SI or HI currently.  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  She completed immunotherapy with pembrolizumab  in 2022.  She is followed by oncology currently.  GERD:  She takes omeprazole  for it with mild relief. She was evaluated by general surgery for hiatal hernia and was later referred to GI. She has completed H. pylori treatment since her EGD. Denies any vomiting, melena or hematochezia.  She takes about 2 cans of Mountain Dew or Sprite almost daily.  Peripheral neuropathy: She has chronic numbness of the right LE > left LE.  She has tried gabapentin , but was causing mild drowsiness and stopped taking gabapentin  now.  She also reports chronic, intermittent mid back pain, worse with standing and lying on the back.  Denies any recent injury or fall.  She was given Cymbalta  previously, but had nausea with it.  She reports irregular menstrual cycles for the last 2 months, has had vaginal bleeding every 2 to 3 weeks.   She has history of heavy menstrual bleeding with cramps in the past.  Denies vaginal discharge currently.  She still has to schedule appointment with OB/GYN.   Past Medical History:  Diagnosis Date   Anemia    Anxiety    Depression    GERD (gastroesophageal reflux disease)    Headache    Shingles    Tobacco abuse 05/06/2020    Past Surgical History:  Procedure Laterality Date   AMPUTATION TOE Right 03/07/2020   Procedure: RIGHT THIRD TOE AMPUTATION;  Surgeon: Aron Shoulders, MD;  Location: [facility];  Service: General;  Laterality: Right;   BREAST LUMPECTOMY  age 26   CESAREAN SECTION  01/06/2006   CESAREAN SECTION  10/15/2016   DENTAL SURGERY  2019   INGUINAL HERNIA REPAIR Right 01/30/2020   Procedure: RIGHT INGUINAL HERNIA REPAIR WITH  MESH;  Surgeon: Aron Shoulders, MD;  Location: [facility];  Service: General;  Laterality: Right;   IR CV LINE INJECTION  09/11/2020   IR IMAGING GUIDED PORT INSERTION  09/12/2020   IR REMOVAL TUN ACCESS W/ PORT W/O FL MOD SED  09/12/2020   IRRIGATION AND DEBRIDEMENT ABSCESS Right 03/07/2020   Procedure: Aspiration of Right Groin Seroma;  Surgeon: Aron Shoulders, MD;  Location: [facility] OR;  Service: General;  Laterality: Right;   MELANOMA EXCISION WITH SENTINEL LYMPH NODE BIOPSY Right 01/30/2020   Procedure: WIDE LOWER EXCISION RIGHT THIRD TOE MELANOMA EXCISION WITH SENTINEL  LYMPH NODE BIOPSY;  Surgeon: Aron Shoulders, MD;  Location: [facility];  Service: General;  Laterality: Right;   PORT-A-CATH REMOVAL N/A 08/12/2022   Procedure: MINOR REMOVAL PORT-A-CATH;  Surgeon: Mavis Anes, MD;  Location: [facility];  Service: General;  Laterality: N/A;   PORTACATH PLACEMENT N/A 03/07/2020   Procedure: INSERTION PORT-A-CATH;  Surgeon: Aron Shoulders, MD;  Location: [facility];  Service: General;  Laterality: Left    Family History  Problem Relation Age of Onset   Heart disease Mother    Cancer Mother    Ovarian cysts Sister    Healthy Sister     Healthy Sister    Healthy Brother    Cancer Maternal Grandmother    Heart attack Maternal Grandmother    Cancer Maternal Grandfather    Cancer Paternal Grandmother    Colon cancer Paternal Grandfather    Other Paternal Grandfather        brain tumor in his 35s. had metal plate inserted   Psoriasis Daughter    Healthy Son    Esophageal cancer Neg Hx    Rectal cancer Neg Hx    Stomach cancer Neg Hx    Migraines Neg Hx     Social History   Socioeconomic History   Marital status: Legally Separated    Spouse name: Not on file   Number of children: 2   Years of education: Not on file   Highest education level: Not on file  Occupational History   Occupation: unemployed d/t COVID  Tobacco Use   Smoking status: Every Day    Current packs/day: 1.00    Average packs/day: 1 pack/day for 29.0 years (29.0 ttl pk-yrs)    Types: Cigarettes   Smokeless tobacco: Never  Vaping Use   Vaping status: Never Used  Substance and Sexual Activity   Alcohol use: No   Drug use: Yes    Frequency: 2.0 times per week    Types: Marijuana    Comment: hardly ever, maybe 3x a month now   Sexual activity: Yes  Other Topics Concern   Not on file  Social History Narrative   Right handed   Caffeine: 6 pack mtn dew cans per day   Lives with others   Social Drivers of Health   Tobacco Use: High Risk (10/19/2024)   Patient History    Smoking Tobacco Use: Every Day    Smokeless Tobacco Use: Never    Passive Exposure: Not on file  Financial Resource Strain: Not on file  Food Insecurity: Not on file  Transportation Needs: Not on file  Physical Activity: Not on file  Stress: Not on file  Social Connections: Unknown (06/26/2023)   Received [facility]   Social Network    Social Network: Not on file  Intimate Partner Violence: Not At Risk (06/26/2023)   Received from [facility]   HITS    Over the last 12 months how often did your partner physically hurt you?: Never    Over the last 12  months how often did your partner insult you or talk down to you?: Never    Over the last 12 months how often did your partner threaten you with physical harm?: Never    Over the last 12 months how often did your partner scream or curse at you?: Never  Depression (PHQ2-9): Low Risk (10/19/2024)   Depression (PHQ2-9)    PHQ-2 Score: 0  Recent Concern: Depression (PHQ2-9) - High Risk (08/15/2024)   Depression (PHQ2-9)  PHQ-2 Score: 16  Alcohol Screen: Not on file  Housing: Not on file  Utilities: Not on file  Health Literacy: Not on file    Outpatient Medications Prior to Visit  Medication Sig Dispense Refill   gabapentin  (NEURONTIN ) 100 MG capsule Take 100 mg by mouth 3 (three) times daily as needed.     Galcanezumab -gnlm (EMGALITY ) 120 MG/ML SOAJ Inject 120 mg into the skin every 30 (thirty) days. 1.12 mL 11   hydrOXYzine  (VISTARIL ) 25 MG capsule Take 1 capsule (25 mg total) by mouth every 8 (eight) hours as needed for anxiety. 60 capsule 2   ibuprofen  (ADVIL ) 800 MG tablet Take 800 mg by mouth every 8 (eight) hours as needed.     MYRBETRIQ  25 MG TB24 tablet TAKE 1 TABLET (25 MG TOTAL) BY MOUTH DAILY. 90 tablet 1   omeprazole  (PRILOSEC) 40 MG capsule TAKE 1 CAPSULE (40 MG TOTAL) BY MOUTH DAILY. 90 capsule 1   SUMAtriptan  (IMITREX ) 100 MG tablet Take 1 tablet (100 mg total) by mouth once as needed for up to 1 dose. May repeat in 2 hours if headache persists or recurs. 12 tablet 12   venlafaxine  XR (EFFEXOR -XR) 150 MG 24 hr capsule TAKE 1 CAPSULE BY MOUTH DAILY WITH BREAKFAST. 90 capsule 1   Vitamin D , Ergocalciferol , (DRISDOL ) 1.25 MG (50000 UNIT) CAPS capsule TAKE 1 CAPSULE (50,000 UNITS TOTAL) BY MOUTH EVERY 7 (SEVEN) DAYS 12 capsule 1   cariprazine  (VRAYLAR ) 1.5 MG capsule Take 1 capsule (1.5 mg total) by mouth daily. 30 capsule 2   No facility-administered medications prior to visit.    Allergies  Allergen Reactions   Doxycycline  Rash    Patient developed diffuse pruritic rash  involving her trunk 2 days after being given metronidazole  and doxycycline  to treat an H. pylori infection.  No other symptoms besides the rash.   Gadavist  [Gadobutrol ] Nausea Only    Mild nausea but no vomiting after injection.  Ok after 5 minutes.   Metronidazole  Rash    Patient developed diffuse pruritic rash involving her trunk 2 days after being given metronidazole  and doxycycline  to treat an H. pylori infection.  No other symptoms besides the rash.    ROS Review of Systems  Constitutional:  Positive for fatigue. Negative for chills and fever.  HENT:  Negative for congestion, sinus pressure, sinus pain and sore throat.   Eyes:  Negative for pain and discharge.  Respiratory:  Negative for cough and shortness of breath.   Cardiovascular:  Negative for chest pain and palpitations.  Gastrointestinal:  Negative for diarrhea, nausea and vomiting.  Endocrine: Negative for polydipsia and polyuria.  Genitourinary:  Negative for dysuria and hematuria.  Musculoskeletal:  Negative for neck pain and neck stiffness.  Skin:  Negative for rash.  Neurological:  Positive for numbness (B/l LE and UE). Negative for dizziness and weakness.  Psychiatric/Behavioral:  Negative for agitation and behavioral problems. The patient is nervous/anxious.       Objective:    Physical Exam Vitals reviewed.  Constitutional:      General: She is not in acute distress.    Appearance: She is not diaphoretic.  HENT:     Head: Normocephalic and atraumatic.     Nose: Nose normal.     Mouth/Throat:     Mouth: Mucous membranes are moist.  Eyes:     General: No scleral icterus.    Extraocular Movements: Extraocular movements intact.  Cardiovascular:     Rate and Rhythm: Normal rate and regular  rhythm.     Heart sounds: Normal heart sounds. No murmur heard. Pulmonary:     Breath sounds: Normal breath sounds. No wheezing or rales.  Abdominal:     Palpations: Abdomen is soft.     Tenderness: There is no  abdominal tenderness.  Musculoskeletal:     Cervical back: Neck supple. No tenderness.     Thoracic back: Tenderness (Paraspinal) present.     Lumbar back: No tenderness. Normal range of motion.     Right lower leg: No edema.     Left lower leg: No edema.  Skin:    General: Skin is warm.     Findings: No rash.  Neurological:     General: No focal deficit present.     Mental Status: She is alert and oriented to person, place, and time.     Sensory: Sensory deficit (RLE, from midshin to toes) present.     Motor: No weakness.  Psychiatric:        Mood and Affect: Mood normal.        Behavior: Behavior normal.     BP 95/64   Pulse 86   Ht 5' 2 (1.575 m)   Wt 113 lb 12.8 oz (51.6 kg)   SpO2 96%   BMI 20.81 kg/m  Wt Readings from Last 3 Encounters:  10/19/24 113 lb 12.8 oz (51.6 kg)  06/07/24 115 lb (52.2 kg)  05/15/24 112 lb 6.4 oz (51 kg)    Lab Results  Component Value Date   TSH 3.605 05/29/2024   Lab Results  Component Value Date   WBC 4.6 05/29/2024   HGB 13.7 05/29/2024   HCT 41.0 05/29/2024   MCV 93.8 05/29/2024   PLT 265 05/29/2024   Lab Results  Component Value Date   NA 136 05/29/2024   K 3.9 05/29/2024   CO2 24 05/29/2024   GLUCOSE 119 (H) 05/29/2024   BUN 9 05/29/2024   CREATININE 0.76 05/29/2024   BILITOT 0.6 05/29/2024   ALKPHOS 77 05/29/2024   AST 24 05/29/2024   ALT 16 05/29/2024   PROT 7.5 05/29/2024   ALBUMIN 3.9 05/29/2024   CALCIUM 9.5 05/29/2024   ANIONGAP 8 05/29/2024   EGFR 100 10/18/2023   Lab Results  Component Value Date   CHOL 144 06/17/2021   Lab Results  Component Value Date   HDL 29 (L) 06/17/2021   Lab Results  Component Value Date   LDLCALC 98 06/17/2021   Lab Results  Component Value Date   TRIG 86 06/17/2021   Lab Results  Component Value Date   CHOLHDL 5.0 06/17/2021   Lab Results  Component Value Date   HGBA1C 5.2 09/13/2023      Assessment & Plan:   Problem List Items Addressed This Visit        Cardiovascular and Mediastinum   Chronic migraine without aura without status migrainosus, not intractable   On Emgality  Followed by Neurology      Relevant Medications   predniSONE (STERAPRED UNI-PAK 21 TAB) 10 MG (21) TBPK tablet   cyclobenzaprine (FLEXERIL) 5 MG tablet     Digestive   Gastroesophageal reflux disease with esophagitis without hemorrhage   Her epigastric pain and nausea is likely due to GERD with esophagitis Had EGD -showed H Pylori, completed treatment Continue omeprazole  40 mg BID Follow up with GI Avoid hot and spicy food Needs to cut down soft drink intake and quit smoking        Endocrine   Hypothyroidism  Lab Results  Component Value Date   TSH 3.605 05/29/2024   Was placed on levothyroxine  in the past, she has stopped taking it since  12/23 TSH and free T4 wnl despite stopping Levothyroxine         Other   Moderate episode of recurrent major depressive disorder (HCC)   Better controlled, improved with Effexor  150 mg QD  DC Vraylar  1.5 mg QD as she has not taken it for the last 2 months Has tried Cymbalta  and Elavil, but did not tolerate them Has tried SSRI in the past without much benefit      GAD (generalized anxiety disorder)   Has anxiety and easy irritability, better controlled On Effexor  150 mg QD Has hydroxyzine  as needed for anxiety/insomnia Advised to perform simple relaxation techniques      Mid back pain, chronic   Chronic mid back pain, recently worse Sterapred taper prescribed Flexeril as needed for muscle spasms/stiffness Avoid heavy lifting and frequent bending Heating pad and/or back brace as needed      Relevant Medications   predniSONE (STERAPRED UNI-PAK 21 TAB) 10 MG (21) TBPK tablet   cyclobenzaprine (FLEXERIL) 5 MG tablet   Encounter for general adult medical examination with abnormal findings - Primary   Physical exam as documented. Counseling done  re healthy lifestyle involving commitment to 150  minutes exercise per week, heart healthy diet, and attaining healthy weight.The importance of adequate sleep also discussed. Immunization and cancer screening needs are specifically addressed at this visit.  PAP smear with Ob.Gyn.      Other Visit Diagnoses       Breast cancer screening by mammogram       Relevant Orders   MM 3D SCREENING MAMMOGRAM BILATERAL BREAST        Meds ordered this encounter  Medications   predniSONE (STERAPRED UNI-PAK 21 TAB) 10 MG (21) TBPK tablet    Sig: Take as package instructions.    Dispense:  1 each    Refill:  0   cyclobenzaprine (FLEXERIL) 5 MG tablet    Sig: Take 1 tablet (5 mg total) by mouth 2 (two) times daily as needed.    Dispense:  30 tablet    Refill:  1    Follow-up: Return in about 6 months (around 04/19/2025).    Suzzane MARLA Blanch, MD

## 2024-10-19 NOTE — Assessment & Plan Note (Signed)
 Physical exam as documented. Counseling done  re healthy lifestyle involving commitment to 150 minutes exercise per week, heart healthy diet, and attaining healthy weight.The importance of adequate sleep also discussed. Immunization and cancer screening needs are specifically addressed at this visit.  PAP smear with Ob.Gyn.

## 2024-10-19 NOTE — Assessment & Plan Note (Signed)
 Her epigastric pain and nausea is likely due to GERD with esophagitis Had EGD -showed H Pylori, completed treatment Continue omeprazole  40 mg BID Follow up with GI Avoid hot and spicy food Needs to cut down soft drink intake and quit smoking

## 2024-10-19 NOTE — Assessment & Plan Note (Signed)
 Chronic mid back pain, recently worse Sterapred taper prescribed Flexeril as needed for muscle spasms/stiffness Avoid heavy lifting and frequent bending Heating pad and/or back brace as needed

## 2024-10-19 NOTE — Assessment & Plan Note (Addendum)
 Better controlled, improved with Effexor  150 mg QD  DC Vraylar  1.5 mg QD as she has not taken it for the last 2 months Has tried Cymbalta  and Elavil, but did not tolerate them Has tried SSRI in the past without much benefit

## 2024-10-19 NOTE — Patient Instructions (Addendum)
 Please schedule mammogram.  Please take Prednisone as prescribed. Please take Flexeril as needed for muscle stiffness/spasms.  Please continue to take medications as prescribed.  Please continue to follow low salt diet and perform moderate exercise/walking at least 150 mins/week.

## 2024-10-19 NOTE — Assessment & Plan Note (Signed)
 Lab Results  Component Value Date   TSH 3.605 05/29/2024   Was placed on levothyroxine  in the past, she has stopped taking it since  12/23 TSH and free T4 wnl despite stopping Levothyroxine 

## 2024-10-19 NOTE — Assessment & Plan Note (Signed)
 On Emgality  Followed by Neurology

## 2024-10-29 DIAGNOSIS — M7989 Other specified soft tissue disorders: Secondary | ICD-10-CM | POA: Diagnosis not present

## 2024-10-29 DIAGNOSIS — Z85820 Personal history of malignant melanoma of skin: Secondary | ICD-10-CM | POA: Diagnosis not present

## 2024-10-29 DIAGNOSIS — T380X5A Adverse effect of glucocorticoids and synthetic analogues, initial encounter: Secondary | ICD-10-CM | POA: Diagnosis not present

## 2024-10-29 DIAGNOSIS — Z79899 Other long term (current) drug therapy: Secondary | ICD-10-CM | POA: Diagnosis not present

## 2024-10-29 DIAGNOSIS — F17210 Nicotine dependence, cigarettes, uncomplicated: Secondary | ICD-10-CM | POA: Diagnosis not present

## 2024-11-03 ENCOUNTER — Inpatient Hospital Stay (HOSPITAL_COMMUNITY): Admission: RE | Admit: 2024-11-03 | Source: Ambulatory Visit

## 2024-11-13 ENCOUNTER — Ambulatory Visit (HOSPITAL_COMMUNITY): Admitting: Clinical

## 2024-11-16 ENCOUNTER — Telehealth: Payer: Self-pay

## 2024-11-16 DIAGNOSIS — F411 Generalized anxiety disorder: Secondary | ICD-10-CM

## 2024-11-16 NOTE — Progress Notes (Signed)
 Complex Care Management Note  Care Guide Note 11/16/2024 Name: [name] MRN: [id number] DOB: May 16, 1976  Donna Munoz is a 49 y.o. year old female who sees Tobie Suzzane POUR, MD for primary care. I reached out to [name] by phone today to offer complex care management services.  [name] was given information about Complex Care Management services today including:   The Complex Care Management services include support from the care team which includes your Nurse Care Manager, Clinical Social Worker, or Pharmacist.  The Complex Care Management team is here to help remove barriers to the health concerns and goals most important to you. Complex Care Management services are voluntary, and the patient may decline or stop services at any time by request to their care team member.   Complex Care Management Consent Status: Patient agreed to services and verbal consent obtained.   Follow up plan:  Telephone appointment with complex care management team member scheduled for:  11/22/24 at 10:00 a.m.   Encounter Outcome:  Patient Scheduled  Dreama [facility]  [facility], [facility] VBCI Assistant Direct Dial: [phone]  Fax: [phone]

## 2024-11-22 ENCOUNTER — Other Ambulatory Visit: Payer: Self-pay | Admitting: *Deleted

## 2024-11-22 ENCOUNTER — Other Ambulatory Visit: Payer: Self-pay | Admitting: Internal Medicine

## 2024-11-22 DIAGNOSIS — G8929 Other chronic pain: Secondary | ICD-10-CM

## 2024-11-22 NOTE — Patient Instructions (Signed)
 Visit Information  Donna Munoz was given information about Medicaid Managed Care team care coordination services as a part of their Healthy [facility] Medicaid benefit. [name]   If you would like to schedule transportation through your Healthy [facility] plan, please call the following number at least 2 days in advance of your appointment: [phone]  For information about your ride after you set it up, call Ride Assist at [phone]. Use this number to activate a Will Call pickup, or if your transportation is late for a scheduled pickup. Use this number, too, if you need to make a change or cancel a previously scheduled reservation.  If you need transportation services right away, call [phone]. The after-hours call center is staffed 24 hours to handle ride assistance and urgent reservation requests (including discharges) 365 days a year. Urgent trips include sick visits, hospital discharge requests and life-sustaining treatment.  Call the [facility] Line at [phone], at any time, 24 hours a day, 7 days a week. If you are in danger or need immediate medical attention call 911.   Please see education materials related to Mental health provided by MyChart link.  Patient verbalizes understanding of instructions and care plan provided today and agrees to view in MyChart. Active MyChart status and patient understanding of how to access instructions and care plan via MyChart confirmed with patient.     Telephone follow up appointment with Managed Medicaid care management team member scheduled for:12-06-2024 at 9:00 am with [place], [place]  Rosina Forte, BSN RN [facility], [facility] RN Care Manager Direct Dial: [phone]  Fax: [phone]

## 2024-11-22 NOTE — Patient Outreach (Signed)
 Complex Care Management   Visit Note  11/22/2024  Name:  Donna Munoz MRN: [id number] DOB: 03/07/1976  Situation: Referral received for Complex Care Management related to Health Plan Referral/ GAD I obtained verbal consent from Patient.  Visit completed with Patient  on the phone  Background:   Past Medical History:  Diagnosis Date   Anemia    Anxiety    Depression    GERD (gastroesophageal reflux disease)    Headache    Shingles    Tobacco abuse 05/06/2020    Assessment: Patient Reported Symptoms:  Cognitive Cognitive Status: No symptoms reported Cognitive/Intellectual Conditions Management [RPT]: None reported or documented in medical history or problem list   Health Maintenance Behaviors: Annual physical exam Healing Pattern: Average Health Facilitated by: Rest  Neurological Neurological Review of Symptoms: Headaches Neurological Management Strategies: Medication therapy Neurological Self-Management Outcome: 4 (good)  HEENT HEENT Symptoms Reported: No symptoms reported HEENT Management Strategies: Routine screening HEENT Self-Management Outcome: 5 (very good)    Cardiovascular Cardiovascular Symptoms Reported: No symptoms reported Does patient have uncontrolled Hypertension?: No Cardiovascular Management Strategies: Routine screening Cardiovascular Self-Management Outcome: 4 (good)  Respiratory Respiratory Symptoms Reported: Dry cough Respiratory Self-Management Outcome: 4 (good)  Endocrine Endocrine Symptoms Reported: No symptoms reported Is patient diabetic?: No Endocrine Self-Management Outcome: 5 (very good)  Gastrointestinal Gastrointestinal Symptoms Reported: Nausea Gastrointestinal Management Strategies: Medication therapy Gastrointestinal Self-Management Outcome: 4 (good)    Genitourinary Genitourinary Symptoms Reported: Frequency Genitourinary Management Strategies: Medication therapy Genitourinary Self-Management Outcome: 4 (good)  Integumentary  Integumentary Symptoms Reported: No symptoms reported Skin Management Strategies: Routine screening Skin Self-Management Outcome: 4 (good)  Musculoskeletal Musculoskelatal Symptoms Reviewed: Back pain, Joint pain Musculoskeletal Management Strategies: Medication therapy, Routine screening Musculoskeletal Self-Management Outcome: 4 (good) Falls in the past year?: No Number of falls in past year: 1 or less Was there an injury with Fall?: No Fall Risk Category Calculator: 0 Patient Fall Risk Level: Low Fall Risk Patient at Risk for Falls Due to: No Fall Risks Fall risk Follow up: Falls evaluation completed  Psychosocial Psychosocial Symptoms Reported: No symptoms reported Behavioral Management Strategies: Coping strategies Behavioral Health Self-Management Outcome: 4 (good) Major Change/Loss/Stressor/Fears (CP): Medical condition, self Techniques to Cope with Loss/Stress/Change: Diversional activities Quality of Family Relationships: supportive Do you feel physically threatened by others?: No    11/22/2024    PHQ2-9 Depression Screening   Little interest or pleasure in doing things Not at all  Feeling down, depressed, or hopeless Not at all  PHQ-2 - Total Score 0  Trouble falling or staying asleep, or sleeping too much    Feeling tired or having little energy    Poor appetite or overeating     Feeling bad about yourself - or that you are a failure or have let yourself or your family down    Trouble concentrating on things, such as reading the newspaper or watching television    Moving or speaking so slowly that other people could have noticed.  Or the opposite - being so fidgety or restless that you have been moving around a lot more than usual    Thoughts that you would be better off dead, or hurting yourself in some way    PHQ2-9 Total Score    If you checked off any problems, how difficult have these problems made it for you to do your work, take care of things at home, or get along  with other people    Depression Interventions/Treatment      There were  no vitals filed for this visit. Pain Scale: 0-10 Pain Score: 8  Pain Type: Chronic pain Pain Location: Back Pain Descriptors / Indicators: Aching Pain Intervention(s): Medication (See eMAR)  Medications Reviewed Today     Reviewed by Bertrum Rosina HERO, RN (Registered Nurse) on 11/22/24 at 1014  Med List Status: <None>   Medication Order Taking? Sig Documenting Provider Last Dose Status Informant  cyclobenzaprine (FLEXERIL) 5 MG tablet [id number] Yes Take 1 tablet (5 mg total) by mouth 2 (two) times daily as needed. Tobie Suzzane POUR, MD  Active   gabapentin  (NEURONTIN ) 100 MG capsule [id number] Yes Take 100 mg by mouth 3 (three) times daily as needed. [provider]  Active   Galcanezumab -gnlm (EMGALITY ) 120 MG/ML SOAJ [id number] Yes Inject 120 mg into the skin every 30 (thirty) days. Gayland Lauraine PARAS, NP  Active   hydrOXYzine  (VISTARIL ) 25 MG capsule [id number] Yes Take 1 capsule (25 mg total) by mouth every 8 (eight) hours as needed for anxiety. Tobie Suzzane POUR, MD  Active   ibuprofen  (ADVIL ) 800 MG tablet [id number] Yes Take 800 mg by mouth every 8 (eight) hours as needed. [provider]  Active   MYRBETRIQ  25 MG TB24 tablet [id number] Yes TAKE 1 TABLET (25 MG TOTAL) BY MOUTH DAILY.  Patient taking differently: Take 25 mg by mouth daily as needed.   McKenzie, Belvie CROME, MD  Active   omeprazole  (PRILOSEC) 40 MG capsule [id number]  TAKE 1 CAPSULE (40 MG TOTAL) BY MOUTH DAILY.  Patient not taking: Reported on 11/22/2024   Stacia Glendia BRAVO, MD  Active   predniSONE  [facility] UNI-PAK 21 TAB) 10 MG (21) TBPK tablet [id number]  Take as package instructions.  Patient not taking: Reported on 11/22/2024   Tobie Suzzane POUR, MD  Consider Medication Status and Discontinue   SUMAtriptan  (IMITREX ) 100 MG tablet [id number] Yes Take 1 tablet (100 mg total) by mouth once as needed for up to 1 dose. May repeat in 2 hours if  headache persists or recurs. Gayland Lauraine PARAS, NP  Active   venlafaxine  XR (EFFEXOR -XR) 150 MG 24 hr capsule [id number]  TAKE 1 CAPSULE BY MOUTH DAILY WITH BREAKFAST.  Patient not taking: Reported on 11/22/2024   Tobie Suzzane POUR, MD  Active   Vitamin D , Ergocalciferol , (DRISDOL ) 1.25 MG (50000 UNIT) CAPS capsule [id number]  TAKE 1 CAPSULE (50,000 UNITS TOTAL) BY MOUTH EVERY 7 (SEVEN) DAYS  Patient not taking: Reported on 11/22/2024   Tobie Suzzane POUR, MD  Active             Recommendation:   Continue Current Plan of Care  Follow Up Plan:   Referral to Licensed Clinical Social Worker  Rosina Bertrum, BSN RN [facility], [facility] RN Care Manager Direct Dial: [phone]  Fax: [phone]

## 2024-12-06 ENCOUNTER — Telehealth: Admitting: Licensed Clinical Social Worker

## 2024-12-07 ENCOUNTER — Other Ambulatory Visit: Payer: Self-pay | Admitting: Licensed Clinical Social Worker

## 2024-12-07 DIAGNOSIS — F411 Generalized anxiety disorder: Secondary | ICD-10-CM

## 2024-12-07 NOTE — Patient Outreach (Signed)
 Complex Care Management   Visit Note  12/07/2024  Name:  Donna Munoz MRN: [id number] DOB: April 29, 1976  Situation: Referral received for Complex Care Management related to Mental/Behavioral Health diagnosis anxiety/depression/agitation. I obtained verbal consent from Patient.  Visit completed with Patient  on the phone  Background:   Past Medical History:  Diagnosis Date   Anemia    Anxiety    Depression    GERD (gastroesophageal reflux disease)    Headache    Shingles    Tobacco abuse 05/06/2020    Assessment: Patient Reported Symptoms:  Cognitive Cognitive Status: No symptoms reported Cognitive/Intellectual Conditions Management [RPT]: None reported or documented in medical history or problem list   Health Maintenance Behaviors: Annual physical exam Healing Pattern: Average Health Facilitated by: Rest  Neurological Neurological Review of Symptoms: Headaches Neurological Management Strategies: Medication therapy Neurological Self-Management Outcome: 4 (good)  HEENT HEENT Symptoms Reported: No symptoms reported HEENT Management Strategies: Routine screening HEENT Self-Management Outcome: 5 (very good)    Cardiovascular Cardiovascular Symptoms Reported: No symptoms reported Does patient have uncontrolled Hypertension?: No Cardiovascular Management Strategies: Routine screening Cardiovascular Self-Management Outcome: 4 (good)  Respiratory Respiratory Symptoms Reported: Dry cough Respiratory Self-Management Outcome: 4 (good)  Endocrine Endocrine Symptoms Reported: No symptoms reported Is patient diabetic?: No Endocrine Self-Management Outcome: 5 (very good)  Gastrointestinal Gastrointestinal Symptoms Reported: Nausea Gastrointestinal Management Strategies: Medication therapy Gastrointestinal Self-Management Outcome: 4 (good)    Genitourinary Genitourinary Symptoms Reported: Frequency Genitourinary Management Strategies: Medication therapy Genitourinary Self-Management  Outcome: 4 (good)  Integumentary Integumentary Symptoms Reported: No symptoms reported Skin Management Strategies: Routine screening Skin Self-Management Outcome: 4 (good)  Musculoskeletal Musculoskelatal Symptoms Reviewed: Back pain, Joint pain Musculoskeletal Management Strategies: Medication therapy, Routine screening Musculoskeletal Self-Management Outcome: 4 (good) Falls in the past year?: No Number of falls in past year: 1 or less Was there an injury with Fall?: No Fall Risk Category Calculator: 0 Patient Fall Risk Level: Low Fall Risk    Psychosocial Psychosocial Symptoms Reported: Anger Behavioral Management Strategies: Coping strategies, Medication therapy Behavioral Health Self-Management Outcome: 3 (uncertain) Major Change/Loss/Stressor/Fears (CP): Medical condition, self Techniques to Cope with Loss/Stress/Change: Diversional activities, Medication Quality of Family Relationships: supportive Do you feel physically threatened by others?: No    12/07/2024    PHQ2-9 Depression Screening   Little interest or pleasure in doing things Not at all  Feeling down, depressed, or hopeless Not at all  PHQ-2 - Total Score 0  Trouble falling or staying asleep, or sleeping too much    Feeling tired or having little energy    Poor appetite or overeating     Feeling bad about yourself - or that you are a failure or have let yourself or your family down    Trouble concentrating on things, such as reading the newspaper or watching television    Moving or speaking so slowly that other people could have noticed.  Or the opposite - being so fidgety or restless that you have been moving around a lot more than usual    Thoughts that you would be better off dead, or hurting yourself in some way    PHQ2-9 Total Score    If you checked off any problems, how difficult have these problems made it for you to do your work, take care of things at home, or get along with other people    Depression  Interventions/Treatment      There were no vitals filed for this visit.    Medications Reviewed Today  Reviewed by Merlynn Lyle CROME, LCSW (Social Worker) on 12/07/24 at 1525  Med List Status: <None>   Medication Order Taking? Sig Documenting Provider Last Dose Status Informant  cyclobenzaprine (FLEXERIL) 5 MG tablet [id number]  Take 1 tablet (5 mg total) by mouth 2 (two) times daily as needed. Tobie Suzzane POUR, MD  Active   gabapentin  (NEURONTIN ) 100 MG capsule [id number]  Take 100 mg by mouth 3 (three) times daily as needed. [provider]  Active   Galcanezumab -gnlm (EMGALITY ) 120 MG/ML SOAJ [id number]  Inject 120 mg into the skin every 30 (thirty) days. Gayland Lauraine PARAS, NP  Active   hydrOXYzine  (VISTARIL ) 25 MG capsule [id number]  Take 1 capsule (25 mg total) by mouth every 8 (eight) hours as needed for anxiety. Tobie Suzzane POUR, MD  Active   ibuprofen  (ADVIL ) 800 MG tablet [id number]  Take 800 mg by mouth every 8 (eight) hours as needed. [provider]  Active   MYRBETRIQ  25 MG TB24 tablet [id number]  TAKE 1 TABLET (25 MG TOTAL) BY MOUTH DAILY.  Patient taking differently: Take 25 mg by mouth daily as needed.   McKenzie, Belvie CROME, MD  Active   omeprazole  (PRILOSEC) 40 MG capsule [id number]  TAKE 1 CAPSULE (40 MG TOTAL) BY MOUTH DAILY.  Patient not taking: Reported on 11/22/2024   Stacia Glendia BRAVO, MD  Active   predniSONE  (STERAPRED UNI-PAK 21 TAB) 10 MG (21) TBPK tablet [id number]  Take as package instructions.  Patient not taking: Reported on 11/22/2024   Tobie Suzzane POUR, MD  Active   SUMAtriptan  (IMITREX ) 100 MG tablet [id number]  Take 1 tablet (100 mg total) by mouth once as needed for up to 1 dose. May repeat in 2 hours if headache persists or recurs. Gayland Lauraine PARAS, NP  Active   venlafaxine  XR (EFFEXOR -XR) 150 MG 24 hr capsule [id number]  TAKE 1 CAPSULE BY MOUTH DAILY WITH BREAKFAST.  Patient not taking: Reported on 11/22/2024   Tobie Suzzane POUR, MD  Active   Vitamin D ,  Ergocalciferol , (DRISDOL ) 1.25 MG (50000 UNIT) CAPS capsule [id number]  TAKE 1 CAPSULE (50,000 UNITS TOTAL) BY MOUTH EVERY 7 (SEVEN) DAYS  Patient not taking: Reported on 11/22/2024   Tobie Suzzane POUR, MD  Active             Recommendation:   PCP Follow-up Referral to: [facility] for talk therapy and psychiatry  Continue Current Plan of Care  Follow Up Plan:   Telephone follow-up in 1 month  Lyle Merlynn, BSW, MSW, LCSW Licensed Clinical Social Worker [employer]   [email or web address] Direct Dial: [phone]

## 2024-12-07 NOTE — Patient Instructions (Signed)
 Visit Information  Thank you for taking time to visit with me today. Please don't hesitate to contact me if I can be of assistance to you before our next scheduled appointment.  Our next appointment is by telephone on 12/21/24 at 930 Please call the care guide team at [phone] if you need to cancel or reschedule your appointment.   Following is a copy of your care plan:   Goals Addressed             This Visit's Progress    VBCI Social Work Care Plan       Problems: GERD, IDA, GAD, MDD, past trauma  Clinical Goal(s): verbalize understanding of plan for management of Anxiety, Depression, Agitation and Stress symptoms and demonstrate a reduction in symptoms. Patient will connect with a provider for ongoing mental health treatment, increase coping skills, healthy habits, self-management skills, and stress reduction      Patient will implement clinical interventions discussed today to decrease symptoms of stress and increase knowledge and/or ability of: coping skills for anxiety management     Clinical Interventions:  Assessed patient's previous and current treatment, coping skills, support system and barriers to care. Patient provided hx  Verbalization of feelings encouraged, motivational interviewing employed Emotional support provided, positive coping strategies explored. Establishing healthy boundaries emphasized and healthy self-care education provided Patient was educated on available mental health resources within their area that accept her insurance and offer counseling and psychiatry. Patient was advised to contact the back of her insurance card for assistance with benefits as well. Patient is agreeable to referral to [facility] for counseling and psychiatry. Referrals placed on 12/07/24. Emotional support provided. CBT intervention implemented regarding being mentally fit by combating negative thinking and replacing it with uplifting support, hope and positivity. Patient receives  strong support from spouse but he is disabled and receives government assistance due to his inability to work. Family are on food stamps. LCSW provided education on relaxation techniques such as meditation, deep breathing, massage, grounding exercises or yoga that can activate the body's relaxation response and ease symptoms of stress and anxiety. LCSW ask that when pt is struggling with difficult emotions and racing thoughts that they start this relaxation response process. LCSW provided extensive education on healthy coping skills for anxiety. SW used active and reflective listening, validated patient's feelings/concerns, and provided emotional support Patient will work on implementing appropriate self-care habits into their daily routine such as: staying positive, writing a gratitude list, drinking water , staying active around the house, taking their medications as prescribed, combating negative thoughts or emotions and staying connected with their family and friends. Positive reinforcement provided for this decision to work on this Motivational Interviewing employed Depression screen reviewed  PHQ2/ PHQ9 completed or reviewed  Mindfulness or Relaxation training provided Active listening / Reflection utilized  Emotional Support Provided Problem Solving /Task Center strategies reviewed Provided psychoeducation for mental health needs  Provided brief CBT  Reviewed mental health medications and discussed importance of compliance: pt reports not taking her medications as prescribed and unsure if they are even helping. Psychiatry referral placed.  Quality of sleep assessed & Sleep Hygiene techniques promoted  Participation in counseling encouraged  Verbalization of feelings encouraged  Suicidal Ideation/Homicidal Ideation assessed: Patient denies SI/HI  Review resources, discussed options and provided patient information about  Mental Health Resources Inter-disciplinary care team collaboration (see  longitudinal plan of care)  Patient Goals/Self-Care Activities: Take medications as prescribed   Attend all scheduled provider appointments Call pharmacy for  medication refills 3-7 days in advance of running out of medications Perform all self care activities independently  Perform IADL's (shopping, preparing meals, housekeeping, managing finances) independently Call provider office for new concerns or questions Work with the social worker to address care coordination needs and will continue to work with the clinical team to address health care and disease management related needs call 1-800-273-TALK (toll free, 24 hour hotline) If in a crisis, CALL 988 or go to [facility] [address] [place] [phone]) Utilize healthy coping skills and supportive resources discussed Contact PCP with any questions or concerns Keep 90 percent of counseling appointments Call your insurance provider for more information about your Enhanced Benefits   Follow up in 30 days        Please call the Suicide and Crisis Lifeline: 988 call the [place]  National Suicide Prevention Lifeline: 859-572-6060 or TTY: (573) 022-7485 TTY 682-648-2785) to talk to a trained counselor call 1-800-273-TALK (toll free, 24 hour hotline) go to [facility] [address], [place] [phone]) call the [facility] Crisis Line: [phone] call 911 if you are experiencing a Mental Health or Behavioral Health Crisis or need someone to talk to.  Patient verbalized understanding of Care plan and visit instructions communicated this visit  Lyle Rung, BSW, MSW, LCSW Licensed Clinical Social Worker [employer]   [facility] [email or web address] Direct Dial: [phone]

## 2024-12-08 ENCOUNTER — Other Ambulatory Visit (HOSPITAL_COMMUNITY)

## 2024-12-15 ENCOUNTER — Ambulatory Visit: Payer: Self-pay

## 2024-12-15 NOTE — Telephone Encounter (Signed)
 FYI Only or Action Required?: FYI only for provider: ED advised.  Patient was last seen in primary care on 10/19/2024 by Tobie Suzzane POUR, MD.  Called Nurse Triage reporting Arm Swelling and Neurologic Problem.  Symptoms began today.  Interventions attempted: Rest, hydration, or home remedies.  Symptoms are: unchanged.  Triage Disposition: Go to ED Now (or PCP Triage)  Patient/caregiver understands and will follow disposition?: Yes, but will wait                                  Onset: bilateral arm tingling onset a couple months ago, worsened today, went to ED for hand swelling on 10/29/25 and symptoms reoccurred today Symptoms: bilateral hand swelling- extends up to wrist and has decreased throughout the day, waking up in the mornings with tingling sensation in bilateral arms- reports tingling typically tapers off, but has stayed present in her right arm today, moderate hand pain (rates 5-6), redness of bilateral hands Vomited x 1 yesterday  Occasional dizziness when standing up too quickly Chest pain when laying down at night, states this is ongoing and is not a new onset Difficulty swallowing during first meal of the day Denies: difficulty breathing, swelling of airway, swelling of tongue/face sudden changes in vision/speech, facial drooping, one-sided weakness/numbness, fever   This RN advised ED. Patient agreed to go, but stated she would not have transportation until later. This RN offered to call EMS. Patient declined and stated she would go to ED later tonight,   Reason for Disposition  Patient sounds very sick or weak to the triager  Protocols used: Neurologic [facility]   Reason for Triage: Pt woke up this morning and hands were swollen like balloons. Upon waking up, hands were swollen, numb/tingly. She couldn't bend her fingers.  Pt was put on muscle relaxer for back pain. Pt took steroids and muscle relaxers. Hands started swelling, pt  went to the hospital. She stopped taking the prednisone  but the back pain is still consistent.

## 2024-12-21 ENCOUNTER — Telehealth: Admitting: Licensed Clinical Social Worker

## 2024-12-22 ENCOUNTER — Telehealth: Admitting: Licensed Clinical Social Worker

## 2025-04-19 ENCOUNTER — Ambulatory Visit: Payer: Self-pay | Admitting: Internal Medicine

## 2025-06-01 ENCOUNTER — Other Ambulatory Visit (HOSPITAL_COMMUNITY)

## 2025-06-01 ENCOUNTER — Other Ambulatory Visit

## 2025-06-08 ENCOUNTER — Ambulatory Visit: Admitting: Oncology

## 2025-08-01 ENCOUNTER — Ambulatory Visit: Admitting: Urology

## 2025-09-05 ENCOUNTER — Telehealth: Admitting: Neurology
